# Patient Record
Sex: Male | Born: 1980
Health system: Southern US, Community
[De-identification: ages and names within clinical notes are randomized; demographics above are authoritative.]

## PROBLEM LIST (undated history)

## (undated) DIAGNOSIS — E7800 Pure hypercholesterolemia, unspecified: Secondary | ICD-10-CM

## (undated) DIAGNOSIS — I10 Essential (primary) hypertension: Secondary | ICD-10-CM

## (undated) DIAGNOSIS — F329 Major depressive disorder, single episode, unspecified: Secondary | ICD-10-CM

## (undated) DIAGNOSIS — E119 Type 2 diabetes mellitus without complications: Secondary | ICD-10-CM

## (undated) DIAGNOSIS — F32A Depression, unspecified: Secondary | ICD-10-CM

## (undated) HISTORY — DX: Essential (primary) hypertension: I10

---
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
## 1998-01-07 ENCOUNTER — Encounter: Admission: RE | Admit: 1998-01-07 | Discharge: 1998-01-07 | Payer: Self-pay | Admitting: Family Medicine

## 1998-01-28 ENCOUNTER — Encounter: Admission: RE | Admit: 1998-01-28 | Discharge: 1998-01-28 | Payer: Self-pay | Admitting: Family Medicine

## 1998-06-05 ENCOUNTER — Encounter: Admission: RE | Admit: 1998-06-05 | Discharge: 1998-06-05 | Payer: Self-pay | Admitting: Family Medicine

## 1998-06-12 ENCOUNTER — Encounter: Admission: RE | Admit: 1998-06-12 | Discharge: 1998-06-12 | Payer: Self-pay | Admitting: Family Medicine

## 1998-07-05 ENCOUNTER — Encounter: Admission: RE | Admit: 1998-07-05 | Discharge: 1998-07-05 | Payer: Self-pay | Admitting: Family Medicine

## 1998-11-01 ENCOUNTER — Encounter: Admission: RE | Admit: 1998-11-01 | Discharge: 1998-11-01 | Payer: Self-pay | Admitting: Family Medicine

## 1999-03-24 ENCOUNTER — Encounter: Payer: Self-pay | Admitting: *Deleted

## 1999-03-24 ENCOUNTER — Emergency Department (HOSPITAL_COMMUNITY): Admission: EM | Admit: 1999-03-24 | Discharge: 1999-03-24 | Payer: Self-pay | Admitting: Emergency Medicine

## 1999-05-09 ENCOUNTER — Emergency Department (HOSPITAL_COMMUNITY): Admission: EM | Admit: 1999-05-09 | Discharge: 1999-05-09 | Payer: Self-pay | Admitting: Emergency Medicine

## 1999-05-09 ENCOUNTER — Encounter: Payer: Self-pay | Admitting: Emergency Medicine

## 1999-10-15 ENCOUNTER — Encounter: Admission: RE | Admit: 1999-10-15 | Discharge: 1999-10-15 | Payer: Self-pay | Admitting: Family Medicine

## 1999-10-31 ENCOUNTER — Encounter: Admission: RE | Admit: 1999-10-31 | Discharge: 1999-10-31 | Payer: Self-pay | Admitting: Family Medicine

## 2000-01-28 ENCOUNTER — Emergency Department (HOSPITAL_COMMUNITY): Admission: EM | Admit: 2000-01-28 | Discharge: 2000-01-28 | Payer: Self-pay | Admitting: Emergency Medicine

## 2000-01-28 ENCOUNTER — Encounter: Payer: Self-pay | Admitting: Emergency Medicine

## 2000-05-24 ENCOUNTER — Encounter: Payer: Self-pay | Admitting: Emergency Medicine

## 2000-05-24 ENCOUNTER — Emergency Department (HOSPITAL_COMMUNITY): Admission: EM | Admit: 2000-05-24 | Discharge: 2000-05-24 | Payer: Self-pay | Admitting: Emergency Medicine

## 2001-04-23 ENCOUNTER — Emergency Department (HOSPITAL_COMMUNITY): Admission: EM | Admit: 2001-04-23 | Discharge: 2001-04-23 | Payer: Self-pay | Admitting: Emergency Medicine

## 2001-12-24 ENCOUNTER — Emergency Department (HOSPITAL_COMMUNITY): Admission: EM | Admit: 2001-12-24 | Discharge: 2001-12-24 | Payer: Self-pay | Admitting: Emergency Medicine

## 2002-09-14 ENCOUNTER — Encounter: Admission: RE | Admit: 2002-09-14 | Discharge: 2002-09-14 | Payer: Self-pay | Admitting: Sports Medicine

## 2003-08-16 ENCOUNTER — Emergency Department (HOSPITAL_COMMUNITY): Admission: EM | Admit: 2003-08-16 | Discharge: 2003-08-16 | Payer: Self-pay | Admitting: Emergency Medicine

## 2004-07-15 ENCOUNTER — Emergency Department (HOSPITAL_COMMUNITY): Admission: EM | Admit: 2004-07-15 | Discharge: 2004-07-15 | Payer: Self-pay | Admitting: Emergency Medicine

## 2005-06-25 ENCOUNTER — Emergency Department (HOSPITAL_COMMUNITY): Admission: EM | Admit: 2005-06-25 | Discharge: 2005-06-25 | Payer: Self-pay | Admitting: Family Medicine

## 2008-01-13 ENCOUNTER — Emergency Department (HOSPITAL_COMMUNITY): Admission: EM | Admit: 2008-01-13 | Discharge: 2008-01-13 | Payer: Self-pay | Admitting: Emergency Medicine

## 2008-02-20 ENCOUNTER — Emergency Department (HOSPITAL_COMMUNITY): Admission: EM | Admit: 2008-02-20 | Discharge: 2008-02-20 | Payer: Self-pay | Admitting: Emergency Medicine

## 2010-09-08 ENCOUNTER — Emergency Department (HOSPITAL_COMMUNITY)
Admission: EM | Admit: 2010-09-08 | Discharge: 2010-09-08 | Payer: Self-pay | Source: Home / Self Care | Admitting: Emergency Medicine

## 2011-03-04 ENCOUNTER — Emergency Department (HOSPITAL_COMMUNITY)
Admission: EM | Admit: 2011-03-04 | Discharge: 2011-03-04 | Disposition: A | Discharge status: Home / Self Care | Payer: Self-pay | Attending: Emergency Medicine | Admitting: Emergency Medicine

## 2011-03-04 DIAGNOSIS — K089 Disorder of teeth and supporting structures, unspecified: Secondary | ICD-10-CM | POA: Insufficient documentation

## 2011-03-04 DIAGNOSIS — K0381 Cracked tooth: Secondary | ICD-10-CM | POA: Insufficient documentation

## 2011-03-04 DIAGNOSIS — K029 Dental caries, unspecified: Secondary | ICD-10-CM | POA: Insufficient documentation

## 2012-02-08 ENCOUNTER — Emergency Department (INDEPENDENT_AMBULATORY_CARE_PROVIDER_SITE_OTHER)
Admission: EM | Admit: 2012-02-08 | Discharge: 2012-02-08 | Disposition: A | Discharge status: Home / Self Care | Payer: Self-pay | Source: Home / Self Care

## 2012-02-08 ENCOUNTER — Encounter (HOSPITAL_COMMUNITY): Payer: Self-pay

## 2012-02-08 DIAGNOSIS — H5789 Other specified disorders of eye and adnexa: Secondary | ICD-10-CM

## 2012-02-08 MED ORDER — TRAMADOL HCL 50 MG PO TABS: 50.0000 mg | ORAL_TABLET | Freq: Four times a day (QID) | ORAL | Status: AC | PRN

## 2012-02-08 MED ORDER — DOXYCYCLINE HYCLATE 100 MG PO TABS: 100.0000 mg | ORAL_TABLET | Freq: Two times a day (BID) | ORAL | Status: AC

## 2012-02-08 NOTE — Discharge Instructions (Signed)
Lesin Ocular (Eye Injury) El ojo puede lesionarse debido a rascaduras, cuerpos extraos, lentes de contacto, luz muy brillante (luz de [place]), e irritacin qumica. La crnea (la parte transparente del ojo) es muy sensible; incluso las heridas mnimas pueden ser dolorosas. La mayor parte de las lesiones en la crnea curan en 2 a 4 das. El tratamiento puede incluir:  Puede ser necesario el uso de antibiticos en gotas oculares o ungento para suavizar el ojo y prevenir infecciones. Las gotas que adormecen el ojo (gotas anestsicas) no deben utilizarse repetidamente. Estas retrasan la cura. Gotas para dilatar la pupila durante 1 a 2 das; a menudo sirven para Engineer, materials.   Parchar el ojo puede reducir la irritacin de parpadear y de la exposicin a la luz brillante. Si tiene un parche en el ojo, no debera conducir ni operar maquinaria porque disminuye su visin perifrica y su capacidad para discernir distancias.   Descansar el ojo. Mantenerse en una habitacin oscura y Chemical engineer lentes de sol para reducir la irritacin que provoca la luz.   No frotar el ojo durante las prximas 2 semanas para permitir Engineer, mining.  Si [place] lentes de contacto, no se los coloque hasta que tenga la autorizacin del profesional que lo asiste. Tambin podr necesitar medicamentos para Chief Technology Officer durante los prximos 1 a 2 das.  SOLICITE ATENCIN MDICA SI:  El dolor aumenta, observa una irritacin persistente o experimenta visin borrosa durante los prximos 2 das.  Document Released: 08/03/2005 Document Revised: 07/23/2011 [facility] Patient Information 2012 [place], [place].

## 2012-02-08 NOTE — ED Notes (Signed)
Pain in eye w swelling, redness
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
## 2013-08-17 HISTORY — PX: INCISION AND DRAINAGE INTRA ORAL ABSCESS: SHX1802

## 2013-09-10 ENCOUNTER — Emergency Department (HOSPITAL_COMMUNITY): Payer: Self-pay

## 2013-09-10 ENCOUNTER — Encounter (HOSPITAL_COMMUNITY): Payer: Self-pay | Admitting: Emergency Medicine

## 2013-09-10 ENCOUNTER — Inpatient Hospital Stay (HOSPITAL_COMMUNITY)
Admission: EM | Admit: 2013-09-10 | Discharge: 2013-09-15 | DRG: 638 | Disposition: A | Discharge status: Home / Self Care | Payer: Self-pay | Attending: Internal Medicine | Admitting: Internal Medicine

## 2013-09-10 DIAGNOSIS — E860 Dehydration: Secondary | ICD-10-CM | POA: Diagnosis present

## 2013-09-10 DIAGNOSIS — E1110 Type 2 diabetes mellitus with ketoacidosis without coma: Secondary | ICD-10-CM

## 2013-09-10 DIAGNOSIS — K0889 Other specified disorders of teeth and supporting structures: Secondary | ICD-10-CM | POA: Diagnosis present

## 2013-09-10 DIAGNOSIS — Z833 Family history of diabetes mellitus: Secondary | ICD-10-CM

## 2013-09-10 DIAGNOSIS — Z8639 Personal history of other endocrine, nutritional and metabolic disease: Secondary | ICD-10-CM | POA: Diagnosis present

## 2013-09-10 DIAGNOSIS — F411 Generalized anxiety disorder: Secondary | ICD-10-CM | POA: Diagnosis present

## 2013-09-10 DIAGNOSIS — K0530 Chronic periodontitis, unspecified: Secondary | ICD-10-CM | POA: Diagnosis present

## 2013-09-10 DIAGNOSIS — E119 Type 2 diabetes mellitus without complications: Secondary | ICD-10-CM | POA: Diagnosis present

## 2013-09-10 DIAGNOSIS — I10 Essential (primary) hypertension: Secondary | ICD-10-CM | POA: Diagnosis present

## 2013-09-10 DIAGNOSIS — F3130 Bipolar disorder, current episode depressed, mild or moderate severity, unspecified: Secondary | ICD-10-CM | POA: Diagnosis present

## 2013-09-10 DIAGNOSIS — F17200 Nicotine dependence, unspecified, uncomplicated: Secondary | ICD-10-CM | POA: Diagnosis present

## 2013-09-10 DIAGNOSIS — E1010 Type 1 diabetes mellitus with ketoacidosis without coma: Principal | ICD-10-CM | POA: Diagnosis present

## 2013-09-10 DIAGNOSIS — Z23 Encounter for immunization: Secondary | ICD-10-CM

## 2013-09-10 DIAGNOSIS — Z880 Allergy status to penicillin: Secondary | ICD-10-CM

## 2013-09-10 LAB — URINALYSIS, ROUTINE W REFLEX MICROSCOPIC
BILIRUBIN URINE: NEGATIVE
Ketones, ur: >80 mg/dL — AB
LEUKOCYTES UA: NEGATIVE
NITRITE: NEGATIVE
PH: 5 (ref 5.0–8.0)
Protein, ur: NEGATIVE mg/dL
Specific Gravity, Urine: 1.036 — ABNORMAL HIGH (ref 1.005–1.030)
Urobilinogen, UA: 0.2 mg/dL (ref 0.0–1.0)

## 2013-09-10 LAB — CBC
HCT: 46.6 % (ref 39.0–52.0)
HEMATOCRIT: 45.4 % (ref 39.0–52.0)
HEMOGLOBIN: 17.5 g/dL — AB (ref 13.0–17.0)
Hemoglobin: 17.2 g/dL — ABNORMAL HIGH (ref 13.0–17.0)
MCH: 30.9 pg (ref 26.0–34.0)
MCH: 30.5 pg (ref 26.0–34.0)
MCHC: 37.9 g/dL — AB (ref 30.0–36.0)
MCHC: 37.6 g/dL — AB (ref 30.0–36.0)
MCV: 81.7 fL (ref 78.0–100.0)
MCV: 81.3 fL (ref 78.0–100.0)
Platelets: 250 10*3/uL (ref 150–400)
Platelets: 234 10*3/uL (ref 150–400)
RBC: 5.56 MIL/uL (ref 4.22–5.81)
RBC: 5.73 MIL/uL (ref 4.22–5.81)
RDW: 12.5 % (ref 11.5–15.5)
RDW: 12.7 % (ref 11.5–15.5)
WBC: 10.7 10*3/uL — ABNORMAL HIGH (ref 4.0–10.5)
WBC: 10.8 10*3/uL — ABNORMAL HIGH (ref 4.0–10.5)

## 2013-09-10 LAB — GLUCOSE, CAPILLARY
Glucose-Capillary: 569 mg/dL (ref 70–99)
Glucose-Capillary: 473 mg/dL — ABNORMAL HIGH (ref 70–99)

## 2013-09-10 LAB — POCT I-STAT TROPONIN I: Troponin i, poc: 0 ng/mL (ref 0.00–0.08)

## 2013-09-10 LAB — BASIC METABOLIC PANEL
BUN: 20 mg/dL (ref 6–23)
BUN: 18 mg/dL (ref 6–23)
CHLORIDE: 89 meq/L — AB (ref 96–112)
CHLORIDE: 97 meq/L (ref 96–112)
CO2: 9 mEq/L — CL (ref 19–32)
CO2: 11 mEq/L — ABNORMAL LOW (ref 19–32)
CREATININE: 1.01 mg/dL (ref 0.50–1.35)
Calcium: 9.6 mg/dL (ref 8.4–10.5)
Calcium: 8.6 mg/dL (ref 8.4–10.5)
Creatinine, Ser: 1.03 mg/dL (ref 0.50–1.35)
GFR calc non Af Amer: >90 mL/min (ref >90)
Glucose, Bld: 558 mg/dL (ref 70–99)
Glucose, Bld: 409 mg/dL — ABNORMAL HIGH (ref 70–99)
POTASSIUM: 5.4 meq/L — AB (ref 3.7–5.3)
Potassium: 5.1 mEq/L (ref 3.7–5.3)
SODIUM: 130 meq/L — AB (ref 137–147)
SODIUM: 134 meq/L — AB (ref 137–147)

## 2013-09-10 LAB — POCT I-STAT 3, VENOUS BLOOD GAS (G3P V)
Acid-base deficit: 15 mmol/L — ABNORMAL HIGH (ref 0.0–2.0)
Bicarbonate: 10.4 mEq/L — ABNORMAL LOW (ref 20.0–24.0)
O2 Saturation: 88 %
PH VEN: 7.223 — AB (ref 7.250–7.300)
TCO2: 11 mmol/L (ref 0–100)
pCO2, Ven: 25.3 mmHg — ABNORMAL LOW (ref 45.0–50.0)
pO2, Ven: 64 mmHg — ABNORMAL HIGH (ref 30.0–45.0)

## 2013-09-10 LAB — COMPREHENSIVE METABOLIC PANEL
ALT: 15 U/L (ref 0–53)
AST: 12 U/L (ref 0–37)
Albumin: 4.4 g/dL (ref 3.5–5.2)
Alkaline Phosphatase: 141 U/L — ABNORMAL HIGH (ref 39–117)
BUN: 19 mg/dL (ref 6–23)
CALCIUM: 9.5 mg/dL (ref 8.4–10.5)
CO2: 10 mEq/L — CL (ref 19–32)
Chloride: 88 mEq/L — ABNORMAL LOW (ref 96–112)
Creatinine, Ser: 1.02 mg/dL (ref 0.50–1.35)
Glucose, Bld: 564 mg/dL (ref 70–99)
Potassium: 4.9 mEq/L (ref 3.7–5.3)
SODIUM: 129 meq/L — AB (ref 137–147)
TOTAL PROTEIN: 8.8 g/dL — AB (ref 6.0–8.3)
Total Bilirubin: 0.4 mg/dL (ref 0.3–1.2)

## 2013-09-10 LAB — KETONES, QUALITATIVE: Acetone, Bld: NEGATIVE

## 2013-09-10 LAB — URINE MICROSCOPIC-ADD ON

## 2013-09-10 LAB — LACTIC ACID, PLASMA: Lactic Acid, Venous: 1.7 mmol/L (ref 0.5–2.2)

## 2013-09-10 LAB — RAPID STREP SCREEN (MED CTR MEBANE ONLY): Streptococcus, Group A Screen (Direct): NEGATIVE

## 2013-09-10 MED ORDER — SODIUM CHLORIDE 0.9 % IV BOLUS (SEPSIS)
1000.0000 mL | Freq: Once | INTRAVENOUS | Status: AC
  Administered 2013-09-10: 1000 mL via INTRAVENOUS

## 2013-09-10 MED ORDER — SODIUM CHLORIDE 0.9 % IV SOLN
INTRAVENOUS | Status: AC
  Administered 2013-09-11: via INTRAVENOUS

## 2013-09-10 MED ORDER — INSULIN GLARGINE 100 UNIT/ML ~~LOC~~ SOLN
40.0000 [IU] | Freq: Every day | SUBCUTANEOUS | Status: DC
  Filled 2013-09-10: qty 0.4

## 2013-09-10 MED ORDER — DEXTROSE 50 % IV SOLN: 25.0000 mL | INTRAVENOUS | Status: DC | PRN

## 2013-09-10 MED ORDER — LURASIDONE HCL 40 MG PO TABS
20.0000 mg | ORAL_TABLET | Freq: Every day | ORAL | Status: DC
  Administered 2013-09-11 – 2013-09-15 (×5): 20 mg via ORAL
  Filled 2013-09-10 (×8): qty 1

## 2013-09-10 MED ORDER — ONDANSETRON HCL 4 MG/2ML IJ SOLN: 4.0000 mg | Freq: Four times a day (QID) | INTRAMUSCULAR | Status: DC | PRN

## 2013-09-10 MED ORDER — INSULIN REGULAR HUMAN 100 UNIT/ML IJ SOLN
INTRAMUSCULAR | Status: DC
  Administered 2013-09-10: 5.1 [IU]/h via INTRAVENOUS
  Administered 2013-09-10: 4.1 [IU]/h via INTRAVENOUS
  Filled 2013-09-10: qty 1

## 2013-09-10 MED ORDER — INSULIN REGULAR HUMAN 100 UNIT/ML IJ SOLN
INTRAMUSCULAR | Status: AC
  Administered 2013-09-11: 5 [IU]/h via INTRAVENOUS
  Administered 2013-09-11: 6.1 [IU]/h via INTRAVENOUS
  Administered 2013-09-11: 4.9 [IU]/h via INTRAVENOUS
  Administered 2013-09-11: 5.8 [IU]/h via INTRAVENOUS
  Administered 2013-09-11: 4.8 [IU]/h via INTRAVENOUS
  Administered 2013-09-11: 4.9 [IU]/h via INTRAVENOUS
  Administered 2013-09-12: 06:00:00 via INTRAVENOUS
  Administered 2013-09-12: 5.4 [IU]/h via INTRAVENOUS
  Administered 2013-09-12: 6.4 [IU]/h via INTRAVENOUS
  Administered 2013-09-12: 3.8 [IU]/h via INTRAVENOUS
  Administered 2013-09-12: 23:00:00 via INTRAVENOUS
  Administered 2013-09-12: 5.2 [IU]/h via INTRAVENOUS
  Administered 2013-09-12: 7.7 [IU]/h via INTRAVENOUS
  Administered 2013-09-12: 5.1 [IU]/h via INTRAVENOUS
  Administered 2013-09-12 (×2): 5.9 [IU]/h via INTRAVENOUS
  Administered 2013-09-12: 8.4 [IU]/h via INTRAVENOUS
  Filled 2013-09-10 (×3): qty 1

## 2013-09-10 MED ORDER — DEXTROSE-NACL 5-0.45 % IV SOLN
INTRAVENOUS | Status: DC
  Administered 2013-09-11 – 2013-09-12 (×3): via INTRAVENOUS

## 2013-09-10 MED ORDER — POTASSIUM CHLORIDE 10 MEQ/100ML IV SOLN
10.0000 meq | INTRAVENOUS | Status: DC
  Administered 2013-09-10 (×2): 10 meq via INTRAVENOUS
  Filled 2013-09-10: qty 100

## 2013-09-10 MED ORDER — SODIUM CHLORIDE 0.9 % IV SOLN
INTRAVENOUS | Status: DC
  Administered 2013-09-10: via INTRAVENOUS

## 2013-09-10 MED ORDER — FLUOXETINE HCL 20 MG PO CAPS
20.0000 mg | ORAL_CAPSULE | Freq: Every day | ORAL | Status: DC
  Administered 2013-09-11 – 2013-09-15 (×5): 20 mg via ORAL
  Filled 2013-09-10 (×5): qty 1

## 2013-09-10 MED ORDER — ENOXAPARIN SODIUM 40 MG/0.4ML ~~LOC~~ SOLN
40.0000 mg | Freq: Every day | SUBCUTANEOUS | Status: DC
  Administered 2013-09-11 – 2013-09-14 (×4): 40 mg via SUBCUTANEOUS
  Filled 2013-09-10 (×5): qty 0.4

## 2013-09-10 MED ORDER — SODIUM CHLORIDE 0.9 % IV SOLN: INTRAVENOUS | Status: DC

## 2013-09-10 MED ORDER — DEXTROSE-NACL 5-0.45 % IV SOLN: INTRAVENOUS | Status: DC

## 2013-09-10 NOTE — ED Notes (Signed)
Pt here with fever headache, dizziness and not feeling well,

## 2013-09-10 NOTE — ED Notes (Signed)
Pt is being seen over at [facility], Pt says he feels like he has the flu, Pt has swelling below left eye, c/o headache, and weight loss

## 2013-09-10 NOTE — H&P (Signed)
[facility] History and Physical  Brandon Mcneil OZH:[id number] DOB: 07/08/1981 DOA: 09/10/2013  Referring physician: ED PCP: Pcp Not In System   Chief Complaint:  Polyuria and Polydipsia x 2 weeks  HPI:  33 year old male with the history of bipolar disorder, active tobacco use who presented to the ED with 2 weeks of polyuria and polydipsia and generalized fatigue as well as some throat congestion. Patient reports being thirsty all the time and drinking a lot of water. He has been getting up 4-5 times a night to urinate. He has been treating excessively tired. He reports that he has lost almost over 20 pounds since he checked his weight at [facility] 3 weeks back. He also has developed aversion to smoking ( reports cutting down to 1/2 PPD). Patient reports having some frontal headache and swelling over left upper jaw with pain over left upper molar, dry mouth, cough with phlegm, throat congestion, nausea but no vomiting, dyspnea on exertion .He denies any fever, chills, denies blurred vision, has nausea but no vomiting, has dyspnea on exertion, denied chest pain, palpitations, abdominal pain or bowel symptoms. Denies any weakness. He denies use of any new medications. He reports not having a drink in the last 3 weeks. (Drinks over the weekends)  Course in the ED Patient noted to be tachycardic to 110s. Blood pressure and a straight normal. Patient was afebrile. O'Donnel showed WBC of 10.7. Chemistry showed and Acidosis with blood glucose greater than 550s and positive urine ketones Suggestive of DKA. Patient had an anion gap of 31. Patient had a potassium of 5.4. His venous blood gas showed pH of 7.2, PCO2 of 25.3 and PO2 of 64 bicarbonate of 10.4. Lactic acid was negative. Serum ketones was negative as well. Patient started on aggressive IV hydration and insulin drip. 5 hospitalists called for admission post abdominal.  Review of Systems:  Constitutional: Denies fever, chills, diaphoresis,  appetite change and fatigue.  HEENT: congestion, sore throat, dry mouth, Denies photophobia, eye pain, redness, hearing loss, ear pain, rhinorrhea, sneezing, mouth sores, trouble swallowing, neck pain, neck stiffness and tinnitus.   Respiratory: Dyspnea on exertion, cough with mucoid phlegm, denies chest tightness,  and wheezing.   Cardiovascular: Denies chest pain, palpitations and leg swelling.  Gastrointestinal: Nausea, Denies vomiting, abdominal pain, diarrhea, constipation, blood in stool and abdominal distention.  Genitourinary: polyuria and polydipsia Denies dysuria, urgency,  hematuria, flank pain and difficulty urinating.  Endocrine: Denies hot or cold intolerance, sweats,  polyuria, polydipsia. Musculoskeletal: Denies myalgias, back pain, joint swelling, arthralgias and gait problem.   Neurological: Denies dizziness, seizures, syncope, weakness, light-headedness, numbness and headaches.  Psychiatric/Behavioral: Denies suicidal ideation, mood changes, confusion, nervousness, sleep disturbance and agitation   Past Medical history Bipolar disorder, tobacco abuse  History reviewed. No pertinent past surgical history.  Social History:  reports that he has been smoking.  He does not have any smokeless tobacco history on file. He reports that he drinks alcohol. He reports that he does not use illicit drugs.  Allergies  Allergen Reactions  . Penicillins Itching and Rash    History reviewed. No pertinent family history.  Prior to Admission medications   Medication Sig Start Date End Date Taking? Authorizing Provider  acetaminophen (TYLENOL) 500 MG tablet Take 1,000 mg by mouth every 6 (six) hours as needed for moderate pain.   Yes Historical Provider, MD  dextromethorphan-guaiFENesin (MUCINEX DM) 30-600 MG per 12 hr tablet Take 1 tablet by mouth daily as needed for cough.   Yes  Historical Provider, MD  ibuprofen (ADVIL,MOTRIN) 200 MG tablet Take 600 mg by mouth every 6 (six) hours as  needed for moderate pain.   Yes Historical Provider, MD    Physical Exam:  Filed Vitals:   09/10/13 2024 09/10/13 2030 09/10/13 2045 09/10/13 2100  BP:  150/92 141/84 145/80  Pulse:  111 110 108  Temp:      TempSrc:      Resp:  16 21 23   Height:      Weight:      SpO2: 96% 94% 97% 98%    Constitutional: Vital signs reviewed.  Patient is a middle aged male lying in bed in no acute distress HEENT: No pallor, no icterus, dry oral mucosa: No cervical lymphadenopathy, mild swelling over left upper jaw with left upper molar caries. Chest: Clear to auscultation bilaterally, no added sounds CVS: Normal S1-S2, no murmurs or gallop Abdomen: Soft, nontender, nondistended, bowel sounds present Extremities: Warm, no edema CNS: AAO x3  Labs on Admission:  Basic Metabolic Panel:  Recent Labs Lab 09/10/13 1757 09/10/13 2118  NA 129* 130*  K 4.9 5.4*  CL 88* 89*  CO2 10* 9*  GLUCOSE 564* 558*  BUN 19 20  CREATININE 1.02 1.03  CALCIUM 9.5 9.6   Liver Function Tests:  Recent Labs Lab 09/10/13 1757  AST 12  ALT 15  ALKPHOS 141*  BILITOT 0.4  PROT 8.8*  ALBUMIN 4.4   No results found for this basename: LIPASE, AMYLASE,  in the last 168 hours No results found for this basename: AMMONIA,  in the last 168 hours CBC:  Recent Labs Lab 09/10/13 1757 09/10/13 2118  WBC 10.7* 10.8*  HGB 17.2* 17.5*  HCT 45.4 46.6  MCV 81.7 81.3  PLT 250 234   Cardiac Enzymes: No results found for this basename: CKTOTAL, CKMB, CKMBINDEX, TROPONINI,  in the last 168 hours BNP: No components found with this basename: POCBNP,  CBG:  Recent Labs Lab 09/10/13 2107 09/10/13 2214  GLUCAP 569* 473*    Radiological Exams on Admission: Dg Chest 2 View  09/10/2013   CLINICAL DATA:  Sternal chest pain and dizziness  EXAM: CHEST  2 VIEW  COMPARISON:  DG CHEST 2 VIEW dated 02/20/2008  FINDINGS: The heart size and mediastinal contours are within normal limits. Both lungs are clear. The visualized  skeletal structures are unremarkable.  IMPRESSION: No active cardiopulmonary disease.   Electronically Signed   By: Christiana Pellant M.D.   On: 09/10/2013 18:46      Assessment/Plan  Active Problems:   DKA (diabetic ketoacidoses) Admit to stepdown. Receive IV hydration with normal saline. Started on IV insulin in the ED. Would continue on glucose nebulizer protocol. Continue to monitor BMET Q2 hr evaluate anion gap. Patient has significantly low bicarbonate. Continue insulin drip until AG closed( <12) and serum bicarb >=15. PH of 7.2. She does not need bicarbonate drip. -transition to lantus 40 units sq ( 0.4 u/ kg) once AG closed and serum bicarb >=15.   Check A1C .  Diabetic coordinator consult.  patient not having insurance. We need to set up [facility]. Case management consult for medication assistance.   Bipolar disorder with anxiety and depression Follows at [facility]. On latuda 20 mg daily and prozac 20 mg daily which i will continue . Denies any suicidal ideation.   Tobacco abuse Counseled on smoking cessation. Will prescribe nicotine patch.  Periodontitis Will place him on empiric doxycycline. Needs outpt  dental follow up upon discharge.  Diet: Diabetic DVT prophylaxis: Subcutaneous Lovenox  Code Status:  Full code Family Communication: none at bedside Disposition Plan: Home once improved  Eddie North [facility] Pager [phone]  If 7PM-7AM, please contact night-coverage [email or web address] Password [facility] 09/10/2013, 11:24 PM   Total time spent: 70 minutes

## 2013-09-10 NOTE — ED Notes (Signed)
Throat is red and is sore

## 2013-09-10 NOTE — ED Provider Notes (Signed)
CSN: [id number]     Arrival date & time 09/10/13  1735 History   First MD Initiated Contact with Patient 09/10/13 2011     Chief Complaint  Patient presents with  . Influenza    HPI: Brandon Mcneil is a 33 yo M with history of HTN who presents with polyuria, polydipsia, fatigue, headache and lightheadedness. Symptoms started three weeks ago with polyuria and polydipsia. He has had progressive fatigue. Over the last week he has noted lightheadedness worse with standing but today was more constant. He endorses mild cough productive of clear sputum. No fever or chills. He has continued to eat. He has not had abdominal pain, vomiting or diarrhea. No dysuria. He has not had symptoms similar to this in the past. Today he felt more lightheaded which prompted his visit to the ED.    History reviewed. No pertinent past medical history. History reviewed. No pertinent past surgical history. History reviewed. No pertinent family history. History  Substance Use Topics  . Smoking status: Current Every Day Smoker  . Smokeless tobacco: Not on file  . Alcohol Use: Yes    Review of Systems  Constitutional: Positive for activity change (decreased), appetite change (decreased ) and fatigue. Negative for fever and chills.  Eyes: Negative for photophobia and visual disturbance.  Respiratory: Positive for cough. Negative for shortness of breath.   Cardiovascular: Negative for chest pain and leg swelling.  Gastrointestinal: Negative for nausea, vomiting, abdominal pain, diarrhea and constipation.  Endocrine: Positive for polydipsia and polyuria.  Genitourinary: Negative for dysuria, frequency and decreased urine volume.  Musculoskeletal: Negative for arthralgias, back pain, gait problem and myalgias.  Skin: Negative for color change and wound.  Neurological: Positive for light-headedness and headaches. Negative for dizziness and syncope.  Psychiatric/Behavioral: Negative for confusion and agitation.  All other  systems reviewed and are negative.     Allergies  Penicillins  Home Medications   Current Outpatient Rx  Name  Route  Sig  Dispense  Refill  . acetaminophen (TYLENOL) 500 MG tablet   Oral   Take 1,000 mg by mouth every 6 (six) hours as needed for moderate pain.         Marland Kitchen dextromethorphan-guaiFENesin (MUCINEX DM) 30-600 MG per 12 hr tablet   Oral   Take 1 tablet by mouth daily as needed for cough.         Marland Kitchen ibuprofen (ADVIL,MOTRIN) 200 MG tablet   Oral   Take 600 mg by mouth every 6 (six) hours as needed for moderate pain.          BP 144/85  Pulse 111  Temp(Src) 97.9 F (36.6 C) (Oral)  Resp 25  Ht 5\' 8"  (1.727 m)  Wt 219 lb 3 oz (99.423 kg)  BMI 33.34 kg/m2  SpO2 96% Physical Exam  Nursing note and vitals reviewed. Constitutional: He is oriented to person, place, and time. He appears well-developed and well-nourished. No distress.  Middle age male, sitting up in bed, appears comfortable, pleasant and interactive.    HENT:  Head: Normocephalic and atraumatic.  Mouth/Throat: Mucous membranes are dry.  Eyes: Conjunctivae and EOM are normal. Pupils are equal, round, and reactive to light.  Neck: Normal range of motion. Neck supple.  Cardiovascular: Regular rhythm, normal heart sounds and intact distal pulses.  Tachycardia present.   Pulmonary/Chest: Effort normal and breath sounds normal. No respiratory distress.  Abdominal: Soft. Bowel sounds are normal. There is no tenderness. There is no rebound and no guarding.  Musculoskeletal:  Normal range of motion. He exhibits no edema and no tenderness.  Neurological: He is alert and oriented to person, place, and time. No cranial nerve deficit. Coordination normal.  Skin: Skin is warm and dry. No rash noted.  Psychiatric: He has a normal mood and affect. His behavior is normal.    ED Course  Procedures (including critical care time) Labs Review Labs Reviewed  CBC - Abnormal; Notable for the following:    WBC 10.7  (*)    Hemoglobin 17.2 (*)    MCHC 37.9 (*)    All other components within normal limits  COMPREHENSIVE METABOLIC PANEL - Abnormal; Notable for the following:    Sodium 129 (*)    Chloride 88 (*)    CO2 10 (*)    Glucose, Bld 564 (*)    Total Protein 8.8 (*)    Alkaline Phosphatase 141 (*)    All other components within normal limits  URINALYSIS, ROUTINE W REFLEX MICROSCOPIC - Abnormal; Notable for the following:    Specific Gravity, Urine 1.036 (*)    Glucose, UA >1000 (*)    Hgb urine dipstick TRACE (*)    Ketones, ur >80 (*)    All other components within normal limits  BASIC METABOLIC PANEL - Abnormal; Notable for the following:    Sodium 130 (*)    Potassium 5.4 (*)    Chloride 89 (*)    CO2 9 (*)    Glucose, Bld 558 (*)    All other components within normal limits  CBC - Abnormal; Notable for the following:    WBC 10.8 (*)    Hemoglobin 17.5 (*)    MCHC 37.6 (*)    All other components within normal limits  URINE MICROSCOPIC-ADD ON - Abnormal; Notable for the following:    Casts HYALINE CASTS (*)    All other components within normal limits  POCT I-STAT 3, BLOOD GAS (G3P V) - Abnormal; Notable for the following:    pH, Ven 7.223 (*)    pCO2, Ven 25.3 (*)    pO2, Ven 64.0 (*)    Bicarbonate 10.4 (*)    Acid-base deficit 15.0 (*)    All other components within normal limits  RAPID STREP SCREEN  CULTURE, GROUP A STREP  KETONES, QUALITATIVE  LACTIC ACID, PLASMA  BLOOD GAS, VENOUS  BASIC METABOLIC PANEL  BASIC METABOLIC PANEL  BASIC METABOLIC PANEL  POCT I-STAT TROPONIN I   Imaging Review Dg Chest 2 View  09/10/2013   CLINICAL DATA:  Sternal chest pain and dizziness  EXAM: CHEST  2 VIEW  COMPARISON:  DG CHEST 2 VIEW dated 02/20/2008  FINDINGS: The heart size and mediastinal contours are within normal limits. Both lungs are clear. The visualized skeletal structures are unremarkable.  IMPRESSION: No active cardiopulmonary disease.   Electronically Signed   By:  Christiana Pellant M.D.   On: 09/10/2013 18:46    EKG Interpretation   None       MDM  33 yo M with history of HTN presents with new onset DM. Low suspicion for infection as CXR negative for consolidation, UA without leukocytes or nitrites, afebrile. Labs c/w DKA given glucose of 564, Co2 10, pH 7.22, AG 31. Lactic acid 1.7. Given two liter of NS and started on insulin gtt. Second BMP shows worsening CO2 to 9 and K of 5.4. Stopped K run. Given he is in DKA he was admitted to Hospitalist for further management. He remained HD stable. Patient in agreement  with plan.   Reviewed imaging, labs and previous medical records, utilized in MDM  Discussed case with Dr. Micheline Mazeocherty  Clinical Impression 1. Diabetic ketoacidosis 2. AG metabolic acidosis     Brandon BilletMathias Cashis Rill, MD 09/11/13 [phone]

## 2013-09-11 DIAGNOSIS — E860 Dehydration: Secondary | ICD-10-CM | POA: Diagnosis present

## 2013-09-11 DIAGNOSIS — F3130 Bipolar disorder, current episode depressed, mild or moderate severity, unspecified: Secondary | ICD-10-CM | POA: Diagnosis present

## 2013-09-11 DIAGNOSIS — F17200 Nicotine dependence, unspecified, uncomplicated: Secondary | ICD-10-CM

## 2013-09-11 DIAGNOSIS — E119 Type 2 diabetes mellitus without complications: Secondary | ICD-10-CM | POA: Diagnosis present

## 2013-09-11 DIAGNOSIS — K0889 Other specified disorders of teeth and supporting structures: Secondary | ICD-10-CM | POA: Diagnosis present

## 2013-09-11 HISTORY — DX: Nicotine dependence, unspecified, uncomplicated: F17.200

## 2013-09-11 HISTORY — DX: Bipolar disorder, current episode depressed, mild or moderate severity, unspecified: F31.30

## 2013-09-11 LAB — BASIC METABOLIC PANEL
BUN: 16 mg/dL (ref 6–23)
BUN: 11 mg/dL (ref 6–23)
BUN: 9 mg/dL (ref 6–23)
BUN: 9 mg/dL (ref 6–23)
BUN: 9 mg/dL (ref 6–23)
BUN: 13 mg/dL (ref 6–23)
CALCIUM: 8.6 mg/dL (ref 8.4–10.5)
CALCIUM: 8.3 mg/dL — AB (ref 8.4–10.5)
CHLORIDE: 102 meq/L (ref 96–112)
CHLORIDE: 98 meq/L (ref 96–112)
CO2: 12 mEq/L — ABNORMAL LOW (ref 19–32)
CO2: 16 mEq/L — ABNORMAL LOW (ref 19–32)
CO2: 16 meq/L — AB (ref 19–32)
CO2: 18 meq/L — AB (ref 19–32)
CO2: 17 mEq/L — ABNORMAL LOW (ref 19–32)
CO2: 13 meq/L — AB (ref 19–32)
CREATININE: 0.78 mg/dL (ref 0.50–1.35)
CREATININE: 0.82 mg/dL (ref 0.50–1.35)
Calcium: 8.7 mg/dL (ref 8.4–10.5)
Calcium: 8.4 mg/dL (ref 8.4–10.5)
Calcium: 8.4 mg/dL (ref 8.4–10.5)
Calcium: 8.5 mg/dL (ref 8.4–10.5)
Chloride: 99 mEq/L (ref 96–112)
Chloride: 101 mEq/L (ref 96–112)
Chloride: 100 mEq/L (ref 96–112)
Chloride: 101 mEq/L (ref 96–112)
Creatinine, Ser: 0.93 mg/dL (ref 0.50–1.35)
Creatinine, Ser: 0.84 mg/dL (ref 0.50–1.35)
Creatinine, Ser: 0.72 mg/dL (ref 0.50–1.35)
Creatinine, Ser: 0.67 mg/dL (ref 0.50–1.35)
GFR calc Af Amer: >90 mL/min (ref >90)
GFR calc Af Amer: >90 mL/min (ref >90)
GFR calc Af Amer: >90 mL/min (ref >90)
GFR calc Af Amer: >90 mL/min (ref >90)
GFR calc non Af Amer: >90 mL/min (ref >90)
GFR calc non Af Amer: >90 mL/min (ref >90)
GFR calc non Af Amer: >90 mL/min (ref >90)
GLUCOSE: 264 mg/dL — AB (ref 70–99)
GLUCOSE: 196 mg/dL — AB (ref 70–99)
Glucose, Bld: 279 mg/dL — ABNORMAL HIGH (ref 70–99)
Glucose, Bld: 184 mg/dL — ABNORMAL HIGH (ref 70–99)
Glucose, Bld: 136 mg/dL — ABNORMAL HIGH (ref 70–99)
Glucose, Bld: 128 mg/dL — ABNORMAL HIGH (ref 70–99)
POTASSIUM: 4.5 meq/L (ref 3.7–5.3)
POTASSIUM: 3.5 meq/L — AB (ref 3.7–5.3)
POTASSIUM: 3.2 meq/L — AB (ref 3.7–5.3)
Potassium: 4.6 mEq/L (ref 3.7–5.3)
Potassium: 3.8 mEq/L (ref 3.7–5.3)
Potassium: 3.8 mEq/L (ref 3.7–5.3)
SODIUM: 132 meq/L — AB (ref 137–147)
SODIUM: 135 meq/L — AB (ref 137–147)
SODIUM: 136 meq/L — AB (ref 137–147)
Sodium: 135 mEq/L — ABNORMAL LOW (ref 137–147)
Sodium: 135 mEq/L — ABNORMAL LOW (ref 137–147)
Sodium: 133 mEq/L — ABNORMAL LOW (ref 137–147)

## 2013-09-11 LAB — GLUCOSE, CAPILLARY
GLUCOSE-CAPILLARY: 146 mg/dL — AB (ref 70–99)
GLUCOSE-CAPILLARY: 164 mg/dL — AB (ref 70–99)
GLUCOSE-CAPILLARY: 199 mg/dL — AB (ref 70–99)
GLUCOSE-CAPILLARY: 222 mg/dL — AB (ref 70–99)
GLUCOSE-CAPILLARY: 304 mg/dL — AB (ref 70–99)
GLUCOSE-CAPILLARY: 350 mg/dL — AB (ref 70–99)
Glucose-Capillary: 113 mg/dL — ABNORMAL HIGH (ref 70–99)
Glucose-Capillary: 120 mg/dL — ABNORMAL HIGH (ref 70–99)
Glucose-Capillary: 136 mg/dL — ABNORMAL HIGH (ref 70–99)
Glucose-Capillary: 140 mg/dL — ABNORMAL HIGH (ref 70–99)
Glucose-Capillary: 141 mg/dL — ABNORMAL HIGH (ref 70–99)
Glucose-Capillary: 142 mg/dL — ABNORMAL HIGH (ref 70–99)
Glucose-Capillary: 144 mg/dL — ABNORMAL HIGH (ref 70–99)
Glucose-Capillary: 145 mg/dL — ABNORMAL HIGH (ref 70–99)
Glucose-Capillary: 150 mg/dL — ABNORMAL HIGH (ref 70–99)
Glucose-Capillary: 153 mg/dL — ABNORMAL HIGH (ref 70–99)
Glucose-Capillary: 158 mg/dL — ABNORMAL HIGH (ref 70–99)
Glucose-Capillary: 162 mg/dL — ABNORMAL HIGH (ref 70–99)
Glucose-Capillary: 197 mg/dL — ABNORMAL HIGH (ref 70–99)
Glucose-Capillary: 199 mg/dL — ABNORMAL HIGH (ref 70–99)
Glucose-Capillary: 222 mg/dL — ABNORMAL HIGH (ref 70–99)
Glucose-Capillary: 230 mg/dL — ABNORMAL HIGH (ref 70–99)
Glucose-Capillary: 239 mg/dL — ABNORMAL HIGH (ref 70–99)
Glucose-Capillary: 373 mg/dL — ABNORMAL HIGH (ref 70–99)

## 2013-09-11 LAB — CBC
HEMATOCRIT: 41.8 % (ref 39.0–52.0)
Hemoglobin: 15.8 g/dL (ref 13.0–17.0)
MCH: 30.7 pg (ref 26.0–34.0)
MCHC: 37.8 g/dL — ABNORMAL HIGH (ref 30.0–36.0)
MCV: 81.3 fL (ref 78.0–100.0)
Platelets: 224 10*3/uL (ref 150–400)
RBC: 5.14 MIL/uL (ref 4.22–5.81)
RDW: 12.7 % (ref 11.5–15.5)
WBC: 11.6 10*3/uL — AB (ref 4.0–10.5)

## 2013-09-11 LAB — MRSA PCR SCREENING: MRSA BY PCR: NEGATIVE

## 2013-09-11 LAB — HEMOGLOBIN A1C
Hgb A1c MFr Bld: 12.1 % — ABNORMAL HIGH (ref <5.7)
Mean Plasma Glucose: 301 mg/dL — ABNORMAL HIGH (ref <117)

## 2013-09-11 LAB — PHOSPHORUS: Phosphorus: 2.7 mg/dL (ref 2.3–4.6)

## 2013-09-11 MED ORDER — LIVING WELL WITH DIABETES BOOK
Freq: Once | Status: DC
  Filled 2013-09-11: qty 1

## 2013-09-11 MED ORDER — DM-GUAIFENESIN ER 30-600 MG PO TB12
1.0000 | ORAL_TABLET | Freq: Every day | ORAL | Status: DC | PRN
  Filled 2013-09-11: qty 1

## 2013-09-11 MED ORDER — BD GETTING STARTED TAKE HOME KIT: 3/10ML X 30G SYRINGES
1.0000 | Freq: Once | Status: AC
  Administered 2013-09-12: 1
  Filled 2013-09-11: qty 1

## 2013-09-11 MED ORDER — LISINOPRIL 2.5 MG PO TABS
2.5000 mg | ORAL_TABLET | Freq: Every day | ORAL | Status: DC
  Administered 2013-09-12 – 2013-09-14 (×3): 2.5 mg via ORAL
  Filled 2013-09-11 (×4): qty 1

## 2013-09-11 MED ORDER — NICOTINE 14 MG/24HR TD PT24
14.0000 mg | MEDICATED_PATCH | Freq: Every day | TRANSDERMAL | Status: DC
  Administered 2013-09-11 – 2013-09-15 (×6): 14 mg via TRANSDERMAL
  Filled 2013-09-11 (×6): qty 1

## 2013-09-11 MED ORDER — DOXYCYCLINE HYCLATE 100 MG PO TABS
100.0000 mg | ORAL_TABLET | Freq: Two times a day (BID) | ORAL | Status: DC
  Administered 2013-09-11 – 2013-09-15 (×10): 100 mg via ORAL
  Filled 2013-09-11 (×12): qty 1

## 2013-09-11 NOTE — Progress Notes (Signed)
Pt anion gap was 16 at 2100 will continue to monitor

## 2013-09-11 NOTE — Plan of Care (Signed)
Chart reviewed-note last BMET @ 3am with AG of 22. Had diet ordered and Lantus planned for 10 am. Will allow clears and check stat BMET and make sure AG has closed before transition to solid diet and Lantus. Also note water deficit of 5 L at admit.  Junious SilkAllison Lelah Rennaker, ANP

## 2013-09-11 NOTE — Progress Notes (Signed)
[facility]  Brandon Mcneil:[id number] DOB: 1981-04-23 DOA: 09/10/2013 PCP: Pcp Not In System  Brief narrative: 33 year old male patient with a history significant for bipolar disorder. Presented to the ER with complaints of polyuria polydipsia for 2 weeks. Noted at least a 20 pound weight loss for the last 3 weeks.   In the emergency department the pt was found to be suffering w/ DKA.  He had no prior hx of DM.  Assessment/Plan:    DKA / Newly diagnosed diabetes -continue insulin gtt until bicarb 18 or > AND anion gap 12 or < -Once able to transition to subcutaneous insulin would prefer to use Relion brand 70/30 since patient does not have insurance -Diabetes educator consulted/ordered for outpatient diabetes clinic follow up -Case management consult for outpatient referral to [facility] clinic -Hemoglobin A1c 12.1 so suspect we'll need to continue insulin for the short term at discharge -Nutrition consultation regarding diet education for new diabetes    Dehydration -At admission water deficit calculated at 5 L -Continue IV fluids    Bipolar I disorder, most recent episode depressed -Continue home meds    Tobacco use disorder    Pain, dental -Started on Vibramycin this admit -If pain persists we'll need to follow up with dentist after discharge-given financial status may need to be referred to a dental clinic as well   DVT prophylaxis: Lovenox Code Status: Full Family Communication: No family at bedside Disposition Plan/Expected LOS: Step down  Physician Consultants: None  Procedures: None  Antibiotics: Vibramycin 1/25 >>>  HPI/Subjective: Patient alert. Shocked at his formal diagnosis of diabetes but states multiple family members with same. No mention of dental pain. No shortness of breath or chest pain  Objective: Blood pressure 142/84, pulse 95, temperature 98.6 F (37 C), temperature source Oral,  resp. rate 17, height 5\' 8"  (1.727 m), weight 219 lb 3 Brandon (99.423 kg), SpO2 98.00%.  Intake/Output Summary (Last 24 hours) at 09/11/13 1347 Last data filed at 09/11/13 1300  Gross per 24 hour  Intake 2826.67 ml  Output   2430 ml  Net 396.67 ml   Exam: General: No acute respiratory distress Lungs: Clear to auscultation bilaterally without wheezes or crackles, RA Cardiovascular: Regular rate and rhythm without murmur gallop or rub normal S1 and S2, no peripheral edema or JVD Abdomen: Nontender, nondistended, soft, bowel sounds positive, no rebound, no ascites, no appreciable mass Musculoskeletal: No significant cyanosis, clubbing of bilateral lower extremities Neurological: Alert and oriented x 3, moves all extremities x 4 without focal neurological deficits, CN 2-12 intact  Scheduled Meds:  Scheduled Meds: . doxycycline  100 mg Oral Q12H  . enoxaparin (LOVENOX) injection  40 mg Subcutaneous Daily  . FLUoxetine  20 mg Oral Daily  . lurasidone  20 mg Oral Q breakfast  . nicotine  14 mg Transdermal Daily   Continuous Infusions: . dextrose 5 % and 0.45% NaCl 75 mL/hr at 09/11/13 0700  . insulin (NOVOLIN-R) infusion 4.9 Units/hr (09/11/13 1338)    Data Reviewed: Basic Metabolic Panel:  Recent Labs Lab 09/10/13 2118 09/10/13 2245 09/11/13 0045 09/11/13 0302 09/11/13 0850  NA 130* 134* 136* 135* 135*  K 5.4* 5.1 4.6 4.5 3.8  CL 89* 97 99 101 100  CO2 9* 11* 13* 12* 16*  GLUCOSE 558* 409* 279* 184* 136*  BUN 20 18 16 13 11   CREATININE 1.03 1.01 0.93 0.84 0.78  CALCIUM 9.6 8.6 8.7 8.4 8.6  PHOS  --   --  2.7  --   --    Liver Function Tests:  Recent Labs Lab 09/10/13 1757  AST 12  ALT 15  ALKPHOS 141*  BILITOT 0.4  PROT 8.8*  ALBUMIN 4.4   No results found for this basename: LIPASE, AMYLASE,  in the last 168 hours No results found for this basename: AMMONIA,  in the last 168 hours  CBC:  Recent Labs Lab 09/10/13 0045 09/10/13 1757 09/10/13 2118  WBC 11.6*  10.7* 10.8*  HGB 15.8 17.2* 17.5*  HCT 41.8 45.4 46.6  MCV 81.3 81.7 81.3  PLT 224 250 234   CBG:  Recent Labs Lab 09/11/13 0907 09/11/13 1013 09/11/13 1121 09/11/13 1232 09/11/13 1335  GLUCAP 239* 145* 140* 144* 141*    Recent Results (from the past 240 hour(s))  RAPID STREP SCREEN     Status: None   Collection Time    09/10/13  5:57 PM      Result Value Range Status   Streptococcus, Group A Screen (Direct) NEGATIVE  NEGATIVE Final   Comment: (NOTE)     A Rapid Antigen test may result negative if the antigen level in the     sample is below the detection level of this test. The FDA has not     cleared this test as a stand-alone test therefore the rapid antigen     negative result has reflexed to a Group A Strep culture.  MRSA PCR SCREENING     Status: None   Collection Time    09/11/13  1:04 AM      Result Value Range Status   MRSA by PCR NEGATIVE  NEGATIVE Final   Comment:            The GeneXpert MRSA Assay (FDA     approved for NASAL specimens     only), is one component of a     comprehensive MRSA colonization     surveillance program. It is not     intended to diagnose MRSA     infection nor to guide or     monitor treatment for     MRSA infections.     Studies:  Recent x-ray studies have been reviewed in detail by the Attending Physician  Time spent : 35mins     Junious Silkllison Ellis, ANP [facility] Office  [phone] Pager [phone]  **If unable to reach the above provider after paging please contact the Flow Manager @ [phone]  On-Call/Text Page:      [email or web address]      password [email or web address]  If 7PM-7AM, please contact night-coverage [email or web address] Password [email or web address] 09/11/2013, 1:47 PM   LOS: 1 day   I have personally examined this patient and reviewed the entire database. I have reviewed the above note, made any necessary editorial changes, and agree with its content.  Lonia BloodJeffrey T. Gyneth Hubka, MD [facility]

## 2013-09-11 NOTE — Progress Notes (Signed)
Inpatient Diabetes Program Recommendations  AACE/ADA: New Consensus Statement on Inpatient Glycemic Control (2013)  Target Ranges:  Prepandial:   less than 140 mg/dL      Peak postprandial:   less than 180 mg/dL (1-2 hours)      Critically ill patients:  140 - 180 mg/dL   Reason for Visit: New onset diabetes. Results for TERRIN, MEDDAUGH (MRN [id number]) as of 09/11/2013 15:49  Ref. Range 09/10/2013 21:07  Glucose-Capillary Latest Range: 70-99 mg/dL [facility])  Results for THARUN, CAPPELLA (MRN [id number]) as of 09/11/2013 15:49  Ref. Range 09/11/2013 03:02  Hemoglobin A1C Latest Range: <5.7 % 12.1 (H)   Diabetes history: No-New onset Outpatient Diabetes medications: None Current orders for Inpatient glycemic control: Insulin drip  Talked to patient briefly about new onset diabetes.  He does have family history.  Discussed likelihood of needing insulin at discharge.  He has already started watching the videos and seems engaged.  Currently he does not have insurance coverage but states he is in the process of getting disability.  Agree with 70/30 regimen do to cost and note plans for follow-up at [facility] clinic.  Discussed [facility] Reli-on brand meter and insulins.  Will order basic diabetes teaching including videos, booklet, and insulin starter kit.  Will follow-up on 09/12/13.  Adah Perl, RN, BC-ADM Inpatient Diabetes Coordinator Pager [phone]

## 2013-09-11 NOTE — Progress Notes (Signed)
Pt anion gap was 15 @1900  will continue to monitor

## 2013-09-11 NOTE — Progress Notes (Signed)
Utilization review completed.  

## 2013-09-11 NOTE — Plan of Care (Signed)
Problem: Food- and Nutrition-Related Knowledge Deficit (NB-1.1) Goal: Nutrition education Formal process to instruct or train a patient/client in a skill or to impart knowledge to help patients/clients voluntarily manage or modify food choices and eating behavior to maintain or improve health. Outcome: Completed/Met Date Met:  09/11/13  RD consulted for nutrition education regarding diabetes.     Lab Results  Component Value Date    HGBA1C 12.1* 09/11/2013    RD provided "Carbohydrate Counting for People with Diabetes" handout from the Academy of Nutrition and Dietetics. Discussed different food groups and their effects on blood sugar, emphasizing carbohydrate-containing foods. Provided list of carbohydrates and recommended serving sizes of common foods.  Discussed importance of controlled and consistent carbohydrate intake throughout the day. Provided examples of ways to balance meals/snacks and encouraged intake of high-fiber, whole grain complex carbohydrates. Teach back method used.  Expect good compliance.  Body mass index is 33.34 kg/(m^2). Pt meets criteria for Obesity Class I based on current BMI.  Current diet order is Clear Liquids.  Patient hungry, ready to eat solids.  Labs and medications reviewed.   No further nutrition interventions warranted at this time. If additional nutrition issues arise, please re-consult RD.  Arthur Holms, RD, LDN  Pager #: [phone] After-Hours Pager #: [phone]

## 2013-09-12 DIAGNOSIS — E860 Dehydration: Secondary | ICD-10-CM

## 2013-09-12 LAB — BASIC METABOLIC PANEL
BUN: 8 mg/dL (ref 6–23)
BUN: 8 mg/dL (ref 6–23)
BUN: 7 mg/dL (ref 6–23)
BUN: 7 mg/dL (ref 6–23)
BUN: 6 mg/dL (ref 6–23)
BUN: 6 mg/dL (ref 6–23)
CALCIUM: 8.3 mg/dL — AB (ref 8.4–10.5)
CHLORIDE: 104 meq/L (ref 96–112)
CO2: 18 mEq/L — ABNORMAL LOW (ref 19–32)
CO2: 20 mEq/L (ref 19–32)
CO2: 16 meq/L — AB (ref 19–32)
CO2: 18 meq/L — AB (ref 19–32)
CO2: 17 meq/L — AB (ref 19–32)
CO2: 19 mEq/L (ref 19–32)
CREATININE: 0.68 mg/dL (ref 0.50–1.35)
CREATININE: 0.77 mg/dL (ref 0.50–1.35)
CREATININE: 0.69 mg/dL (ref 0.50–1.35)
Calcium: 8.5 mg/dL (ref 8.4–10.5)
Calcium: 8.4 mg/dL (ref 8.4–10.5)
Calcium: 8.3 mg/dL — ABNORMAL LOW (ref 8.4–10.5)
Calcium: 8.4 mg/dL (ref 8.4–10.5)
Calcium: 8.5 mg/dL (ref 8.4–10.5)
Chloride: 101 mEq/L (ref 96–112)
Chloride: 100 mEq/L (ref 96–112)
Chloride: 104 mEq/L (ref 96–112)
Chloride: 105 mEq/L (ref 96–112)
Chloride: 101 mEq/L (ref 96–112)
Creatinine, Ser: 0.67 mg/dL (ref 0.50–1.35)
Creatinine, Ser: 0.68 mg/dL (ref 0.50–1.35)
Creatinine, Ser: 0.64 mg/dL (ref 0.50–1.35)
GFR calc Af Amer: >90 mL/min (ref >90)
GFR calc Af Amer: >90 mL/min (ref >90)
GFR calc Af Amer: >90 mL/min (ref >90)
GFR calc Af Amer: >90 mL/min (ref >90)
GFR calc Af Amer: >90 mL/min (ref >90)
GFR calc non Af Amer: >90 mL/min (ref >90)
GFR calc non Af Amer: >90 mL/min (ref >90)
GFR calc non Af Amer: >90 mL/min (ref >90)
GFR calc non Af Amer: >90 mL/min (ref >90)
GLUCOSE: 182 mg/dL — AB (ref 70–99)
GLUCOSE: 109 mg/dL — AB (ref 70–99)
Glucose, Bld: 106 mg/dL — ABNORMAL HIGH (ref 70–99)
Glucose, Bld: 130 mg/dL — ABNORMAL HIGH (ref 70–99)
Glucose, Bld: 210 mg/dL — ABNORMAL HIGH (ref 70–99)
Glucose, Bld: 210 mg/dL — ABNORMAL HIGH (ref 70–99)
POTASSIUM: 3.8 meq/L (ref 3.7–5.3)
POTASSIUM: 3.4 meq/L — AB (ref 3.7–5.3)
Potassium: 3.3 mEq/L — ABNORMAL LOW (ref 3.7–5.3)
Potassium: 3 mEq/L — ABNORMAL LOW (ref 3.7–5.3)
Potassium: 3.4 mEq/L — ABNORMAL LOW (ref 3.7–5.3)
Potassium: 3.2 mEq/L — ABNORMAL LOW (ref 3.7–5.3)
SODIUM: 135 meq/L — AB (ref 137–147)
SODIUM: 136 meq/L — AB (ref 137–147)
Sodium: 134 mEq/L — ABNORMAL LOW (ref 137–147)
Sodium: 135 mEq/L — ABNORMAL LOW (ref 137–147)
Sodium: 136 mEq/L — ABNORMAL LOW (ref 137–147)
Sodium: 133 mEq/L — ABNORMAL LOW (ref 137–147)

## 2013-09-12 LAB — GLUCOSE, CAPILLARY
GLUCOSE-CAPILLARY: 129 mg/dL — AB (ref 70–99)
GLUCOSE-CAPILLARY: 182 mg/dL — AB (ref 70–99)
GLUCOSE-CAPILLARY: 187 mg/dL — AB (ref 70–99)
GLUCOSE-CAPILLARY: 200 mg/dL — AB (ref 70–99)
Glucose-Capillary: 102 mg/dL — ABNORMAL HIGH (ref 70–99)
Glucose-Capillary: 108 mg/dL — ABNORMAL HIGH (ref 70–99)
Glucose-Capillary: 134 mg/dL — ABNORMAL HIGH (ref 70–99)
Glucose-Capillary: 136 mg/dL — ABNORMAL HIGH (ref 70–99)
Glucose-Capillary: 136 mg/dL — ABNORMAL HIGH (ref 70–99)
Glucose-Capillary: 154 mg/dL — ABNORMAL HIGH (ref 70–99)
Glucose-Capillary: 158 mg/dL — ABNORMAL HIGH (ref 70–99)
Glucose-Capillary: 161 mg/dL — ABNORMAL HIGH (ref 70–99)
Glucose-Capillary: 162 mg/dL — ABNORMAL HIGH (ref 70–99)
Glucose-Capillary: 174 mg/dL — ABNORMAL HIGH (ref 70–99)
Glucose-Capillary: 178 mg/dL — ABNORMAL HIGH (ref 70–99)
Glucose-Capillary: 188 mg/dL — ABNORMAL HIGH (ref 70–99)
Glucose-Capillary: 191 mg/dL — ABNORMAL HIGH (ref 70–99)

## 2013-09-12 LAB — CULTURE, GROUP A STREP

## 2013-09-12 MED ORDER — POTASSIUM CHLORIDE 20 MEQ/15ML (10%) PO LIQD
40.0000 meq | Freq: Once | ORAL | Status: AC
  Administered 2013-09-12: 40 meq via ORAL
  Filled 2013-09-12: qty 30

## 2013-09-12 MED ORDER — POTASSIUM CHLORIDE CRYS ER 20 MEQ PO TBCR: 40.0000 meq | EXTENDED_RELEASE_TABLET | Freq: Once | ORAL | Status: DC

## 2013-09-12 MED ORDER — POTASSIUM CHLORIDE 20 MEQ/15ML (10%) PO LIQD
20.0000 meq | Freq: Once | ORAL | Status: AC
  Administered 2013-09-12: 20 meq via ORAL
  Filled 2013-09-12: qty 15

## 2013-09-12 NOTE — Plan of Care (Signed)
Problem: Consults Goal: Diabetes Mellitus Patient Education See Patient Education Module for education specifics. Outcome: Progressing Pt watching Diabetes videos through pt education, asked this writer question all questions were answered . Had a lengthy discussion with patient about need to control and monitor blood sugar. Diabetes educator to see patient in the am. Goal: Diagnosis-Diabetes Mellitus Outcome: Progressing New Onset Type II   Problem: Phase I Progression Outcomes Goal: CBGs steadily decreasing with treatment Outcome: Progressing Blood sugars are decreasing but anion gap is still @ 15 as of 0500 hrs Goal: NPO or per MD order Outcome: Progressing Pt on diet Goal: Pain controlled with appropriate interventions Outcome: Progressing Pt denies pain Goal: Voiding-avoid urinary catheter unless indicated Outcome: Progressing Pt voiding Goal: Hemodynamically stable Outcome: Progressing vss

## 2013-09-12 NOTE — Progress Notes (Addendum)
Pt has watched educational videos. Pt has received and been educated on BD starter packet. Pt gave self lovenox injection for practice of similar insulin injections. Performed well. Pt performing self finger stick glucose monitoring, properly. Will continue to educate patient on questions pertaining to DM. Will show pt how to give insulin injections once I/G gtt DC. Pt given living well with DM book.

## 2013-09-12 NOTE — Progress Notes (Signed)
   CARE MANAGEMENT NOTE 09/12/2013  Patient:  Brandon Mcneil,Brandon Mcneil   Account Number:  [id number]  Date Initiated:  09/12/2013  Documentation initiated by:  Donn PieriniWEBSTER,Maddock Finigan  Subjective/Objective Assessment:   Pt admitted with DKA     Action/Plan:   PTA pt lived at home - independent   Anticipated DC Date:  09/15/2013   Anticipated DC Plan:  HOME/SELF CARE      DC Planning Services  CM consult  [facility]igent [facility] Clinic      Choice offered to / List presented to:             Status of service:  In process, will continue to follow Medicare Important Message given?   (If response is "NO", the following Medicare IM given date fields will be blank) Date Medicare IM given:   Date Additional Medicare IM given:    Discharge Disposition:    Per UR Regulation:  Reviewed for med. necessity/level of care/duration of stay  If discussed at Long Length of Stay Meetings, dates discussed:    Comments:  09/12/13- 1430- Donn PieriniKristi Carmelo Reidel RN, BSN [phone] Referral for PCP needs- pt does not have insurance- in to speak with pt regarding clinic options for - pt states that he has applied for disability and is in the appeals process- hoping to hear something by March. [facility] with [facility] to f/u with pt regarding disability questions and Medicaid potential- message left for [facility]. Also spoke with pt about which clinic would be most convenient for him to get to for f/u- [facility] him and he feels would be the best location for him to get to for f/u. [facility] and f/u appointment made for Feb. 10 at 1045- pt will need to bring picture ID and medication list. - Explained to pt that clinic has sliding scale fee and he would need to do eligibility to see what his fee would be- message was left with Willaim BanePamela Henry - the eligibility person at clinic- call returned and she spoke with pt by phone for eligibility and gave pt instructions on what he needed to  bring to his appointment - fee will be $35- which pt states is doable. - will cont. to follow for medication needs- [facility] DM needs-

## 2013-09-12 NOTE — Progress Notes (Signed)
Inpatient Diabetes Program Recommendations  AACE/ADA: New Consensus Statement on Inpatient Glycemic Control (2013)  Target Ranges:  Prepandial:   less than 140 mg/dL      Peak postprandial:   less than 180 mg/dL (1-2 hours)      Critically ill patients:  140 - 180 mg/dL   Reason for Visit: New onset diabetes.    Patient continues on insulin drip.  He has watched all diabetes videos and is anxious to eat.  Discussed briefly CHO content of foods and portions including the plate method.  He gave himself his Lovenox injection today.  Once subcutaneous insulin is started, he can begin practicing insulin administration. [facility].  Discussed glucose meter and insulin [facility] Will follow.  Thanks, Beryl MeagerJenny Renesha Lizama, RN, BC-ADM Inpatient Diabetes Coordinator Pager [phone]

## 2013-09-12 NOTE — Progress Notes (Signed)
Physician notified: A. Rennis HardingEllis NP At: 1536  Regarding: FYI GAP 14 with BMP from 1400.

## 2013-09-12 NOTE — ED Provider Notes (Signed)
Medical screening examination/treatment/procedure(s) were conducted as a shared visit with resident-physician practitioner(s) and myself.  I personally evaluated the patient during the encounter.  Pt is a 33 y.o. male with pmhx as above presenting with new onset DM and DKA.  Pt remains HDS, was started on glucostabilizer protocol.  [facility] to admit.   Shanna CiscoMegan E Docherty, MD 09/12/13 [phone]

## 2013-09-12 NOTE — Progress Notes (Signed)
[facility]  VIDIT BOISSONNEAULT IDP:[id number] DOB: [birth year] DOA: 09/10/2013 PCP: Pcp Not In System  Brief narrative: 33 year old male patient with a history significant for bipolar disorder. Presented to the ER with complaints of polyuria polydipsia for 2 weeks. Noted at least a 20 pound weight loss for the last 3 weeks.   In the emergency department the pt was found to be suffering w/ DKA.  He had no prior hx of DM.  Assessment/Plan:    DKA / Newly diagnosed diabetes -continue insulin gtt until bicarb 18 or > AND anion gap 12 or < -10 am AG 15- if closes later today plan is to transition to SQ insulin -Once able to transition to subcutaneous insulin would prefer to use Relion brand 70/30 since patient does not have insurance -Diabetes educator consulted/ordered for outpatient diabetes clinic follow up -Case management consult for outpatient referral to [facility] clinic -Hemoglobin A1c 12.1 so plan on insulin at dc -Nutrition consultation regarding diet education for new diabetes *AG at 3 pm 14    Dehydration -At admission water deficit calculated at 5 L -Continue IV fluids    Bipolar I disorder, most recent episode depressed -Continue home meds    Tobacco use disorder    Pain, dental -Started on Vibramycin this admit -If pain persists we'll need to follow up with dentist after discharge-given financial status may need to be referred to a dental clinic as well   DVT prophylaxis: Lovenox Code Status: Full Family Communication: No family at bedside Disposition Plan/Expected LOS: Step down until can come off glucomander  Physician Consultants: None  Procedures: None  Antibiotics: Vibramycin 1/25 >>>  HPI/Subjective: Patient alert -RN at bedside teaching pt how to self inject insulin using NS. Pt denies abd pain or other sx's  Objective: Blood pressure 129/86, pulse 85, temperature 97.8 F (36.6 C), temperature  source Oral, resp. rate 19, height _0  (1.727 m), weight 219 lb 3 oz (99.423 kg), SpO2 100.00%.  Intake/Output Summary (Last 24 hours) at 09/12/13 1058 Last data filed at 09/12/13 0900  Gross per 24 hour  Intake   1765 ml  Output   1200 ml  Net    565 ml   Exam: General: No acute respiratory distress Lungs: Clear to auscultation bilaterally without wheezes or crackles, RA Cardiovascular: Regular rate and rhythm without murmur gallop or rub normal S1 and S2, no peripheral edema or JVD Abdomen: Nontender, nondistended, soft, bowel sounds positive, no rebound, no ascites, no appreciable mass Musculoskeletal: No significant cyanosis, clubbing of bilateral lower extremities Neurological: Alert and oriented x 3, moves all extremities x 4 without focal neurological deficits, CN 2-12 intact  Scheduled Meds:  Scheduled Meds: . bd getting started take home kit  1 kit Other Once  . doxycycline  100 mg Oral Q12H  . enoxaparin (LOVENOX) injection  40 mg Subcutaneous Daily  . FLUoxetine  20 mg Oral Daily  . lisinopril  2.5 mg Oral Daily  . living well with diabetes book   Does not apply Once  . lurasidone  20 mg Oral Q breakfast  . nicotine  14 mg Transdermal Daily   Continuous Infusions: . dextrose 5 % and 0.45% NaCl 75 mL/hr at 09/12/13 0531  . insulin (NOVOLIN-R) infusion 5.9 Units/hr (09/12/13 1035)    Data Reviewed: Basic Metabolic Panel:  Recent Labs Lab 09/10/13 2245 09/11/13 0045  09/11/13 1916 09/11/13 4431 09/12/13 0112 09/12/13 0320 09/12/13 5400  NA 134* 136*  < > 132* 133* 134* 135* 135*  K 5.1 4.6  < > 3.8 3.2* 3.3* 3.0* 3.8  CL 97 99  < > 98 101 101 100 104  CO2 11* 13*  < > 18* 16* 18* 20 16*  GLUCOSE 409* 279*  < > 264* 196* 106* 130* 210*  BUN 18 16  < > _0 CREATININE 1.01 0.93  < > 0.82 0.67 0.68 0.77 0.67  CALCIUM 8.6 8.7  < > 8.3* 8.5 8.5 8.4 8.3*  PHOS  --  2.7  --   --   --   --   --   --   < > = values in this interval not displayed. Liver  Function Tests:  Recent Labs Lab 09/10/13 1757  AST 12  ALT 15  ALKPHOS 141*  BILITOT 0.4  PROT 8.8*  ALBUMIN 4.4   No results found for this basename: LIPASE, AMYLASE,  in the last 168 hours No results found for this basename: AMMONIA,  in the last 168 hours  CBC:  Recent Labs Lab 09/10/13 0045 09/10/13 1757 09/10/13 2118  WBC 11.6* 10.7* 10.8*  HGB 15.8 17.2* 17.5*  HCT 41.8 45.4 46.6  MCV 81.3 81.7 81.3  PLT 224 250 234   CBG:  Recent Labs Lab 09/12/13 0613 09/12/13 0715 09/12/13 0821 09/12/13 0928 09/12/13 1033  GLUCAP 182* 191* 187* 200* 158*    Recent Results (from the past 240 hour(s))  RAPID STREP SCREEN     Status: None   Collection Time    09/10/13  5:57 PM      Result Value Range Status   Streptococcus, Group A Screen (Direct) NEGATIVE  NEGATIVE Final   Comment: (NOTE)     A Rapid Antigen test may result negative if the antigen level in the     sample is below the detection level of this test. The FDA has not     cleared this test as a stand-alone test therefore the rapid antigen     negative result has reflexed to a Group A Strep culture.  CULTURE, GROUP A STREP     Status: None   Collection Time    09/10/13  5:57 PM      Result Value Range Status   Specimen Description THROAT   Final   Special Requests NONE   Final   Culture     Final   Value: NO SUSPICIOUS COLONIES, CONTINUING TO HOLD     Performed at [facility]   Report Status PENDING   Incomplete  MRSA PCR SCREENING     Status: None   Collection Time    09/11/13  1:04 AM      Result Value Range Status   MRSA by PCR NEGATIVE  NEGATIVE Final   Comment:            The GeneXpert MRSA Assay (FDA     approved for NASAL specimens     only), is one component of a     comprehensive MRSA colonization     surveillance program. It is not     intended to diagnose MRSA     infection nor to guide or     monitor treatment for     MRSA infections.     Studies:  Recent x-ray  studies have been reviewed in detail by the Attending Physician  Time spent : 20mns     AErin Hearing ANP [facility]  [phone] Pager [phone]  **If unable to reach the above provider after paging please contact the [place] @ [phone]  On-Call/Text Page:      [email or web address]      password [email or web address]  If 7PM-7AM, please contact night-coverage [email or web address] Password [facility] 09/12/2013, 10:58 AM   LOS: 2 days    I have examined the patient, reviewed the chart and modified the above note which I agree with.   Cullan Launer,MD [phone] 09/12/2013, 4:37 PM

## 2013-09-12 NOTE — Progress Notes (Signed)
Pt anion gap is 15 @ 0330, will continue to monitor

## 2013-09-12 NOTE — Progress Notes (Signed)
Anion gap 15 with 0940 BMP. Weston SettleA. Ellis notified.

## 2013-09-13 LAB — GLUCOSE, CAPILLARY
GLUCOSE-CAPILLARY: 114 mg/dL — AB (ref 70–99)
GLUCOSE-CAPILLARY: 114 mg/dL — AB (ref 70–99)
GLUCOSE-CAPILLARY: 137 mg/dL — AB (ref 70–99)
GLUCOSE-CAPILLARY: 164 mg/dL — AB (ref 70–99)
GLUCOSE-CAPILLARY: 165 mg/dL — AB (ref 70–99)
GLUCOSE-CAPILLARY: 178 mg/dL — AB (ref 70–99)
GLUCOSE-CAPILLARY: 216 mg/dL — AB (ref 70–99)
GLUCOSE-CAPILLARY: 369 mg/dL — AB (ref 70–99)
Glucose-Capillary: 142 mg/dL — ABNORMAL HIGH (ref 70–99)
Glucose-Capillary: 100 mg/dL — ABNORMAL HIGH (ref 70–99)
Glucose-Capillary: 155 mg/dL — ABNORMAL HIGH (ref 70–99)
Glucose-Capillary: 167 mg/dL — ABNORMAL HIGH (ref 70–99)
Glucose-Capillary: 181 mg/dL — ABNORMAL HIGH (ref 70–99)
Glucose-Capillary: 182 mg/dL — ABNORMAL HIGH (ref 70–99)
Glucose-Capillary: 141 mg/dL — ABNORMAL HIGH (ref 70–99)
Glucose-Capillary: 290 mg/dL — ABNORMAL HIGH (ref 70–99)
Glucose-Capillary: 371 mg/dL — ABNORMAL HIGH (ref 70–99)

## 2013-09-13 LAB — BASIC METABOLIC PANEL
BUN: 5 mg/dL — AB (ref 6–23)
BUN: 5 mg/dL — AB (ref 6–23)
CALCIUM: 8.4 mg/dL (ref 8.4–10.5)
CHLORIDE: 105 meq/L (ref 96–112)
CO2: 20 mEq/L (ref 19–32)
CO2: 20 meq/L (ref 19–32)
CREATININE: 0.71 mg/dL (ref 0.50–1.35)
Calcium: 8.2 mg/dL — ABNORMAL LOW (ref 8.4–10.5)
Chloride: 106 mEq/L (ref 96–112)
Creatinine, Ser: 0.65 mg/dL (ref 0.50–1.35)
GFR calc Af Amer: >90 mL/min (ref >90)
GFR calc non Af Amer: >90 mL/min (ref >90)
GFR calc non Af Amer: >90 mL/min (ref >90)
GLUCOSE: 174 mg/dL — AB (ref 70–99)
Glucose, Bld: 157 mg/dL — ABNORMAL HIGH (ref 70–99)
POTASSIUM: 3.8 meq/L (ref 3.7–5.3)
POTASSIUM: 3.3 meq/L — AB (ref 3.7–5.3)
SODIUM: 138 meq/L (ref 137–147)
Sodium: 139 mEq/L (ref 137–147)

## 2013-09-13 MED ORDER — INSULIN ASPART PROT & ASPART (70-30 MIX) 100 UNIT/ML ~~LOC~~ SUSP
40.0000 [IU] | Freq: Two times a day (BID) | SUBCUTANEOUS | Status: DC
  Filled 2013-09-13: qty 10

## 2013-09-13 MED ORDER — INSULIN ASPART PROT & ASPART (70-30 MIX) 100 UNIT/ML ~~LOC~~ SUSP
20.0000 [IU] | Freq: Two times a day (BID) | SUBCUTANEOUS | Status: DC
  Administered 2013-09-13 (×2): 20 [IU] via SUBCUTANEOUS
  Filled 2013-09-13 (×2): qty 10

## 2013-09-13 MED ORDER — INSULIN ASPART 100 UNIT/ML ~~LOC~~ SOLN
0.0000 [IU] | Freq: Every day | SUBCUTANEOUS | Status: DC
  Administered 2013-09-13: 5 [IU] via SUBCUTANEOUS
  Administered 2013-09-14: 3 [IU] via SUBCUTANEOUS

## 2013-09-13 MED ORDER — POTASSIUM CHLORIDE 20 MEQ/15ML (10%) PO LIQD
20.0000 meq | Freq: Once | ORAL | Status: AC
  Administered 2013-09-13: 20 meq via ORAL
  Filled 2013-09-13: qty 15

## 2013-09-13 MED ORDER — POTASSIUM CHLORIDE CRYS ER 20 MEQ PO TBCR
40.0000 meq | EXTENDED_RELEASE_TABLET | Freq: Once | ORAL | Status: AC
  Administered 2013-09-13: 40 meq via ORAL
  Filled 2013-09-13: qty 2

## 2013-09-13 MED ORDER — INSULIN ASPART 100 UNIT/ML ~~LOC~~ SOLN
0.0000 [IU] | Freq: Three times a day (TID) | SUBCUTANEOUS | Status: DC
  Administered 2013-09-13: 5 [IU] via SUBCUTANEOUS
  Administered 2013-09-13: 9 [IU] via SUBCUTANEOUS
  Administered 2013-09-14: 7 [IU] via SUBCUTANEOUS
  Administered 2013-09-14: 3 [IU] via SUBCUTANEOUS
  Administered 2013-09-14: 7 [IU] via SUBCUTANEOUS
  Administered 2013-09-15: 2 [IU] via SUBCUTANEOUS
  Administered 2013-09-15: 1 [IU] via SUBCUTANEOUS

## 2013-09-13 NOTE — Progress Notes (Signed)
Brandon Mcneil:[id number] DOB: Jan 21, 1981 DOA: 09/10/2013 PCP: Pcp Not In System  Brief narrative: 33 year old male patient with a history significant for bipolar disorder. Presented to the ER with complaints of polyuria polydipsia for 2 weeks. Noted at least a 20 pound weight loss for the last 3 weeks.   In the emergency department the pt was found to be suffering w/ DKA.  He had no prior hx of DM.  Assessment/Plan:    DKA / Newly diagnosed diabetes -Transitioned to 70/30 insulin 1/28 - prefer to use [employer] brand since patient does not have insurance -Diabetes educator consulted/ordered for outpatient diabetes clinic follow up -Case management has arranged for outpt f/u  -Hemoglobin A1c 12.1  -Nutrition consulted regarding diet education for new diabetes    Dehydration -resolved w/ volume resuscitation     Bipolar I disorder, most recent episode depressed -Continue home meds -Latuda can cause hyperglycemia but pt well controlled on this med so would adjust insulin as opposed to dc this med    Tobacco use disorder    Pain, dental -Started on Vibramycin this admit -If pain persists we'll need to follow up with dentist after discharge - given financial status may need to be referred to a dental clinic as well  DVT prophylaxis: Lovenox Code Status: Full Family Communication: No family at bedside Disposition Plan/Expected LOS: Transfer to floor but keep on Team 1-if CBG remains well controlled on 70/30 can dc in next 24-48 hrs  Physician Consultants: None  Procedures: None  Antibiotics: Vibramycin 1/25 >>>  HPI/Subjective: Patient alert and standing at side of bed.  Objective: Blood pressure 122/67, pulse 87, temperature 98.3 F (36.8 C), temperature source Oral, resp. rate 17, height 5\' 8"  (1.727 m), weight 219 lb 3 oz (99.423 kg), SpO2 98.00%.  Intake/Output Summary (Last 24 hours) at 09/13/13 1252 Last  data filed at 09/13/13 0815  Gross per 24 hour  Intake 2496.5 ml  Output   1200 ml  Net 1296.5 ml   Exam: General: No acute respiratory distress Lungs: Clear to auscultation bilaterally without wheezes or crackles, RA Cardiovascular: Regular rate and rhythm without murmur gallop or rub normal S1 and S2, no peripheral edema or JVD Abdomen: Nontender, nondistended, soft, bowel sounds positive, no rebound, no ascites, no appreciable mass Musculoskeletal: No significant cyanosis, clubbing of bilateral lower extremities Neurological: Alert and oriented x 3, moves all extremities x 4 without focal neurological deficits, CN 2-12 intact  Scheduled Meds:  Scheduled Meds: . doxycycline  100 mg Oral Q12H  . enoxaparin (LOVENOX) injection  40 mg Subcutaneous Daily  . FLUoxetine  20 mg Oral Daily  . insulin aspart  0-5 Units Subcutaneous QHS  . insulin aspart  0-9 Units Subcutaneous TID WC  . insulin aspart protamine- aspart  20 Units Subcutaneous BID WC  . lisinopril  2.5 mg Oral Daily  . living well with diabetes book   Does not apply Once  . lurasidone  20 mg Oral Q breakfast  . nicotine  14 mg Transdermal Daily   Data Reviewed: Basic Metabolic Panel:  Recent Labs Lab 09/10/13 2245 09/11/13 0045  09/12/13 1408 09/12/13 1837 09/12/13 2226 09/13/13 0310 09/13/13 0540  NA 134* 136*  < > 136* 136* 133* 138 139  K 5.1 4.6  < > 3.4* 3.4* 3.2* 3.8 3.3*  CL 97 99  < > 104 105 101 105 106  CO2 11* 13*  < >  18* 17* 19 20 20   GLUCOSE 409* 279*  < > 210* 182* 109* 157* 174*  BUN 18 16  < > 7 6 6  5* 5*  CREATININE 1.01 0.93  < > 0.68 0.64 0.69 0.71 0.65  CALCIUM 8.6 8.7  < > 8.3* 8.4 8.5 8.4 8.2*  PHOS  --  2.7  --   --   --   --   --   --   < > = values in this interval not displayed. Liver Function Tests:  Recent Labs Lab 09/10/13 1757  AST 12  ALT 15  ALKPHOS 141*  BILITOT 0.4  PROT 8.8*  ALBUMIN 4.4   CBC:  Recent Labs Lab 09/10/13 0045 09/10/13 1757 09/10/13 2118    WBC 11.6* 10.7* 10.8*  HGB 15.8 17.2* 17.5*  HCT 41.8 45.4 46.6  MCV 81.3 81.7 81.3  PLT 224 250 234   CBG:  Recent Labs Lab 09/13/13 0519 09/13/13 0622 09/13/13 0724 09/13/13 0823 09/13/13 1126  GLUCAP 182* 178* 165* 141* 290*    Recent Results (from the past 240 hour(s))  RAPID STREP SCREEN     Status: None   Collection Time    09/10/13  5:57 PM      Result Value Range Status   Streptococcus, Group A Screen (Direct) NEGATIVE  NEGATIVE Final   Comment: (NOTE)     A Rapid Antigen test may result negative if the antigen level in the     sample is below the detection level of this test. The FDA has not     cleared this test as a stand-alone test therefore the rapid antigen     negative result has reflexed to a Group A Strep culture.  CULTURE, GROUP A STREP     Status: None   Collection Time    09/10/13  5:57 PM      Result Value Range Status   Specimen Description THROAT   Final   Special Requests NONE   Final   Culture     Final   Value: STREPTOCOCCUS,BETA HEMOLYTIC NOT GROUP A     [facility] at [facility]   Report Status 09/12/2013 FINAL   Final  MRSA PCR SCREENING     Status: None   Collection Time    09/11/13  1:04 AM      Result Value Range Status   MRSA by PCR NEGATIVE  NEGATIVE Final   Comment:            The GeneXpert MRSA Assay (FDA     approved for NASAL specimens     only), is one component of a     comprehensive MRSA colonization     surveillance program. It is not     intended to diagnose MRSA     infection nor to guide or     monitor treatment for     MRSA infections.     Studies:  Recent x-ray studies have been reviewed in detail by the Attending Physician  Time spent : 25mins     Junious Silkllison Ellis, ANP [facility] Office  [phone] Pager [phone]  **If unable to reach the above provider after paging please contact the Flow Manager @ [phone]  On-Call/Text Page:      [email or web address]      password [email or web address]  If 7PM-7AM,  please contact night-coverage [email or web address] Password [email or web address] 09/13/2013, 12:52 PM   LOS: 3 days   I have personally examined this patient and reviewed the entire database.  I have reviewed the above note, made any necessary editorial changes, and agree with its content.  Cherene Altes, MD [facility]

## 2013-09-13 NOTE — Progress Notes (Signed)
Pt [facility] to 5W11 [facility] . Pt stable denies pain , SOB, or any other concern .No other needs at this time handoff given.

## 2013-09-13 NOTE — Plan of Care (Signed)
AG closed > 8 hours- current AG=13 so will transition to 70/30 20 units BID w/ meals and add sensitive SSI. (Received >90 units of regular insulin via drip/24 hrs)  Junious SilkAllison Yoshi Vicencio, ANP

## 2013-09-13 NOTE — Progress Notes (Signed)
Inpatient Diabetes Program Recommendations  AACE/ADA: New Consensus Statement on Inpatient Glycemic Control (2013)  Target Ranges:  Prepandial:   less than 140 mg/dL      Peak postprandial:   less than 180 mg/dL (1-2 hours)      Critically ill patients:  140 - 180 mg/dL   Reason for Visit: Patient transitioned off insulin drip this morning to 70/30 regimen.   Followed up with patient regarding diabetes survival skills.  Discussed A1C goals and Diabetes Standards of Care.  Reviewed proper identification and treatment of hypoglycemia.  Patient was able to teach back.  Also gave him handouts regarding fast foods nutritional facts, CHO counting, etc.  Patient states he feels good about taking care of himself.  Reiterated the importance of follow-up with PCP and continued management of diabetes to prevent complications.   Thanks, Beryl MeagerJenny Kahleah Crass, RN, BC-ADM Inpatient Diabetes Coordinator Pager [phone]

## 2013-09-13 NOTE — Progress Notes (Signed)
NURSING PROGRESS NOTE  Brandon Mcneil [id number] Transfer Data: 09/13/2013 5:14 PM Attending Provider: Lonia BloodJeffrey T McClung, MD PCP:Pcp Not In System Code Status: full  Brandon Mcneil is a 33 y.o. male patient transferred from 3S -No acute distress noted.  -No complaints of shortness of breath.  -No complaints of chest pain.    Blood pressure 142/78, pulse 105, temperature 97.5 F (36.4 C), temperature source Oral, resp. rate 22, height 5\' 8"  (1.727 m), weight 99.423 kg (219 lb 3 oz), SpO2 97.00%.   IV Fluids:  IV in place, occlusive dsg intact without redness, IV cath antecubital right, condition patent and no redness NSL.   Allergies:  Penicillins  Past Medical History:   has no past medical history on file.  Past Surgical History:   has no past surgical history on file.  Social History:   reports that he has been smoking.  He does not have any smokeless tobacco history on file. He reports that he drinks alcohol. He reports that he does not use illicit drugs.  Skin: warm dry intact   Patient/Family orientated to room. Side rails up x 2, fall assessment and education completed with patient/family. Patient/family able to verbalize understanding of risk associated with falls and verbalized understanding to call for assistance before getting out of bed. Call light within reach. Patient/family able to voice and demonstrate understanding of unit orientation instructions.    Will continue to evaluate and treat per MD orders.

## 2013-09-14 DIAGNOSIS — K089 Disorder of teeth and supporting structures, unspecified: Secondary | ICD-10-CM

## 2013-09-14 LAB — BASIC METABOLIC PANEL
BUN: 9 mg/dL (ref 6–23)
CHLORIDE: 103 meq/L (ref 96–112)
CO2: 23 mEq/L (ref 19–32)
CREATININE: 0.75 mg/dL (ref 0.50–1.35)
Calcium: 8.6 mg/dL (ref 8.4–10.5)
GFR calc non Af Amer: >90 mL/min (ref >90)
GLUCOSE: 233 mg/dL — AB (ref 70–99)
POTASSIUM: 3.6 meq/L — AB (ref 3.7–5.3)
Sodium: 139 mEq/L (ref 137–147)

## 2013-09-14 LAB — GLUCOSE, CAPILLARY
GLUCOSE-CAPILLARY: 316 mg/dL — AB (ref 70–99)
Glucose-Capillary: 250 mg/dL — ABNORMAL HIGH (ref 70–99)
Glucose-Capillary: 340 mg/dL — ABNORMAL HIGH (ref 70–99)
Glucose-Capillary: 252 mg/dL — ABNORMAL HIGH (ref 70–99)

## 2013-09-14 MED ORDER — INSULIN NPH ISOPHANE & REGULAR (70-30) 100 UNIT/ML ~~LOC~~ SUSP: 30.0000 [IU] | Freq: Two times a day (BID) | SUBCUTANEOUS | Status: DC

## 2013-09-14 MED ORDER — LURASIDONE HCL 20 MG PO TABS
20.0000 mg | ORAL_TABLET | Freq: Every day | ORAL | Status: DC
Start: 2013-09-14 — End: 2015-08-10

## 2013-09-14 MED ORDER — NICOTINE 14 MG/24HR TD PT24: 14.0000 mg | MEDICATED_PATCH | Freq: Every day | TRANSDERMAL | Status: DC

## 2013-09-14 MED ORDER — INSULIN ASPART PROT & ASPART (70-30 MIX) 100 UNIT/ML ~~LOC~~ SUSP
30.0000 [IU] | Freq: Two times a day (BID) | SUBCUTANEOUS | Status: DC
  Administered 2013-09-14: 30 [IU] via SUBCUTANEOUS
  Filled 2013-09-14: qty 10

## 2013-09-14 MED ORDER — GLUCOSE BLOOD VI STRP: ORAL_STRIP | Status: DC

## 2013-09-14 MED ORDER — LISINOPRIL 5 MG PO TABS: 5.0000 mg | ORAL_TABLET | Freq: Every day | ORAL | Status: DC

## 2013-09-14 MED ORDER — METRONIDAZOLE 500 MG PO TABS: 500.0000 mg | ORAL_TABLET | Freq: Three times a day (TID) | ORAL | Status: DC

## 2013-09-14 MED ORDER — DOXYCYCLINE HYCLATE 100 MG PO TABS: 100.0000 mg | ORAL_TABLET | Freq: Two times a day (BID) | ORAL | Status: DC

## 2013-09-14 MED ORDER — LIVING WELL WITH DIABETES BOOK: 1.0000 | Freq: Once | Status: DC

## 2013-09-14 MED ORDER — FLUOXETINE HCL 20 MG PO CAPS: 20.0000 mg | ORAL_CAPSULE | Freq: Every day | ORAL | Status: DC

## 2013-09-14 MED ORDER — LISINOPRIL 5 MG PO TABS
5.0000 mg | ORAL_TABLET | Freq: Every day | ORAL | Status: DC
  Administered 2013-09-15: 5 mg via ORAL
  Filled 2013-09-14: qty 1

## 2013-09-14 MED ORDER — INSULIN ASPART PROT & ASPART (70-30 MIX) 100 UNIT/ML ~~LOC~~ SUSP: 30.0000 [IU] | Freq: Two times a day (BID) | SUBCUTANEOUS | Status: DC

## 2013-09-14 MED ORDER — "INSULIN SYRINGE-NEEDLE U-100 28G X 1/2"" 0.3 ML MISC": 1.0000 | Freq: Three times a day (TID) | Status: DC

## 2013-09-14 MED ORDER — CIPROFLOXACIN HCL 500 MG PO TABS: 500.0000 mg | ORAL_TABLET | Freq: Two times a day (BID) | ORAL | Status: DC

## 2013-09-14 MED ORDER — RELION CONFIRM GLUCOSE MONITOR W/DEVICE KIT: 1.0000 | PACK | Freq: Three times a day (TID) | Status: DC

## 2013-09-14 MED ORDER — INSULIN ASPART PROT & ASPART (70-30 MIX) 100 UNIT/ML ~~LOC~~ SUSP
40.0000 [IU] | Freq: Two times a day (BID) | SUBCUTANEOUS | Status: DC
  Administered 2013-09-14: 40 [IU] via SUBCUTANEOUS
  Filled 2013-09-14: qty 10

## 2013-09-14 MED ORDER — INSULIN ASPART PROT & ASPART (70-30 MIX) 100 UNIT/ML ~~LOC~~ SUSP
36.0000 [IU] | Freq: Two times a day (BID) | SUBCUTANEOUS | Status: DC
  Filled 2013-09-14: qty 10

## 2013-09-14 NOTE — Discharge Instructions (Signed)
Blood Glucose Monitoring, Adult Monitoring your blood glucose (also know as blood sugar) helps you to manage your diabetes. It also helps you and your health care provider monitor your diabetes and determine how well your treatment plan is working. WHY SHOULD YOU MONITOR YOUR BLOOD GLUCOSE?  It can help you understand how food, exercise, and medicine affect your blood glucose.  It allows you to know what your blood glucose is at any given moment. You can quickly tell if you are having low blood glucose (hypoglycemia) or high blood glucose (hyperglycemia).  It can help you and your health care provider know how to adjust your medicines.  It can help you understand how to manage an illness or adjust medicine for exercise. WHEN SHOULD YOU TEST? Your health care provider will help you decide how often you should check your blood glucose. This may depend on the type of diabetes you have, your diabetes control, or the types of medicines you are taking. Be sure to write down all of your blood glucose readings so that this information can be reviewed with your health care provider. See below for examples of testing times that your health care provider may suggest. Type 1 Diabetes  Test 4 times a day if you are in good control, using an insulin pump, or perform multiple daily injections.  If your diabetes is not well-controlled or if you are sick, you may need to monitor more often.  It is a good idea to also monitor:  Before and after exercise.  Between meals and 2 hours after a meal.  Occasionally between 2:00 to 3:00 am. Type 2 Diabetes  It can vary with each person, but generally, if you are on insulin, test 4 times a day.  If you take medicines by mouth (orally), test 2 times a day.  If you are on a controlled diet, test once a day.  If your diabetes is not well controlled or if you are sick, you may need to monitor more often. HOW TO MONITOR YOUR BLOOD GLUCOSE Supplies  Needed  Blood glucose meter.  Test strips for your meter. Each meter has its own strips. You must use the strips that go with your own meter.  A pricking needle (lancet).  A device that holds the lancet (lancing device).  A journal or log book to write down your results. Procedure  Wash your hands with soap and water. Alcohol is not preferred.  Prick the side of your finger (not the tip) with the lancet.  Gently milk the finger until a small drop of blood appears.  Follow the instructions that come with your meter for inserting the test strip, applying blood to the strip, and using your blood glucose meter. Other Areas to Get Blood for Testing Some meters allow you to use other areas of your body (other than your finger) to test your blood. These areas are called alternative sites. The most common alternative sites are:  The forearm.  The thigh.  The back area of the lower leg.  The palm of the hand. The blood flow in these areas is slower. Therefore, the blood glucose values you get may be delayed, and the numbers are different from what you would get from your fingers. Do not use alternative sites if you think you are having hypoglycemia. Your reading will not be accurate. Always use a finger if you are having hypoglycemia. Also, if you cannot feel your lows (hypoglycemia unawareness), always use your fingers for your blood  glucose checks. ADDITIONAL TIPS FOR GLUCOSE MONITORING  Do not reuse lancets.  Always carry your supplies with you.  All blood glucose meters have a 24-hour "hotline" number to call if you have questions or need help.  Adjust (calibrate) your blood glucose meter with a control solution after finishing a few boxes of strips. BLOOD GLUCOSE RECORD KEEPING It is a good idea to keep a daily record or log of your blood glucose readings. Most glucose meters, if not all, keep your glucose records stored in the meter. Some meters come with the ability to download  your records to your home computer. Keeping a record of your blood glucose readings is especially helpful if you are wanting to look for patterns. Make notes to go along with the blood glucose readings because you might forget what happened at that exact time. Keeping good records helps you and your health care provider to work together to achieve good diabetes management.  Document Released: 08/06/2003 Document Revised: 04/05/2013 Document Reviewed: 12/26/2012 [facility] Patient Information 2014 [place], [place].  Diabetes and Sick Day Management Blood sugar (glucose) can be more difficult to control when you are sick. Colds, fever, flu, nausea, vomiting, and diarrhea are all examples of common illnesses that can cause problems for people with diabetes. Loss of body fluids (dehydration) from fever, vomiting, diarrhea, infection, and the stress of a sickness can all cause blood glucose levels to increase. Because of this, it is very important to take your diabetes medicines and to eat some form of carbohydrate food when you are sick. Liquid or soft foods are often tolerated, and they help to replace fluids. HOME CARE INSTRUCTIONS These main guidelines are intended for managing a short-term (24 hours or less) sickness:  Take your usual dose of insulin or oral diabetes medicine. An exception would be if you take any form of metformin. If you cannot eat or drink, you can become dehydrated and should not take this medicine.  Continue to take your insulin even if you are unable to eat solid foods or are vomiting. Your insulin dose may stay the same, or it may need to be increased when you are sick.  You will need to test your blood glucose more often, generally every 2 4 hours. If you have type 1 diabetes, test your urine for ketones every 4 hours. If you have type 2 diabetes, test your urine for ketones as directed by your health care provider.  Eat some form of food that contains carbohydrates. The  carbohydrates can be in solid or liquid form. You should eat 45 50 g of carbohydrates every 3 4 hours.  Replace fluids if you have a fever, vomit, or have diarrhea. Ask your health care provider for specific rehydration instructions.  Watch carefully for the signs of ketoacidosis if you have type 1 diabetes. Call your health care provider if any of the following symptoms are present, especially in children:  Moderate to large ketones in the urine along with a high blood glucose level.  Severe nausea.  Vomiting.  Diarrhea.  Abdominal pain.  Rapid breathing.  Drink extra liquids that do not contain sugar, such as water or sugar-free liquids, or caffeine.  Be careful with over-the-counter medicines. Read the labels. They may contain sugar or types of sugars that can increase your blood glucose level. Food Choices for Illness All of the food choices below contain about 15 g of carbohydrates. Plan ahead and keep some of these foods around.    to  cup carbonated beverage containing sugar. Carbonated beverages will usually be better tolerated if they are opened and left at room temperature for a few minutes.   of a twin frozen ice pop.   cup regular gelatin.   cup juice.   cup ice cream or frozen yogurt.   cup cooked cereal.   cup sherbet.  1 cup clear broth or soup.  1 cup cream soup.   cup regular custard.   cup regular pudding.  1 cup sports drink.  1 cup plain yogurt.  1 slice toast.  6 squares saltine crackers.  5 vanilla wafers.   cup sports drink. SEEK MEDICAL CARE IF:   You are unable to drink fluids, even small amounts.  You have nausea and vomiting for more than 6 hours.  You have diarrhea for more than 6 hours.  Your blood glucose level is more than 240 mg/dL, even with additional insulin.  There is a change in mental status.  You develop an additional serious sickness.  You have been sick for 2 days and are not getting  better.  You have had a fever for 2 days. SEEK IMMEDIATE MEDICAL CARE IF:  You have difficulty breathing.  You have moderate to large ketone levels. MAKE SURE YOU:  Understand these instructions.  Will watch your condition.  Will get help right away if you are not doing well or get worse. Document Released: 08/06/2003 Document Revised: 04/05/2013 Document Reviewed: 01/10/2013 [facility] Patient Information 2014 [place], [place].  Diabetes and Exercise Exercising regularly is important. It is not just about losing weight. It has many health benefits, such as:  Improving your overall fitness, flexibility, and endurance.  Increasing your bone density.  Helping with weight control.  Decreasing your body fat.  Increasing your muscle strength.  Reducing stress and tension.  Improving your overall health. People with diabetes who exercise gain additional benefits because exercise:  Reduces appetite.  Improves the body's use of blood sugar (glucose).  Helps lower or control blood glucose.  Decreases blood pressure.  Helps control blood lipids (such as cholesterol and triglycerides).  Improves the body's use of the hormone insulin by:  Increasing the body's insulin sensitivity.  Reducing the body's insulin needs.  Decreases the risk for heart disease because exercising:  Lowers cholesterol and triglycerides levels.  Increases the levels of good cholesterol (such as high-density lipoproteins [HDL]) in the body.  Lowers blood glucose levels. YOUR ACTIVITY PLAN  Choose an activity that you enjoy and set realistic goals. Your health care provider or diabetes educator can help you make an activity plan that works for you. You can break activities into 2 or 3 sessions throughout the day. Doing so is as good as one long session. Exercise ideas include:  Taking the dog for a walk.  Taking the stairs instead of the elevator.  Dancing to your favorite song.  Doing your  favorite exercise with a friend. RECOMMENDATIONS FOR EXERCISING WITH TYPE 1 OR TYPE 2 DIABETES   Check your blood glucose before exercising. If blood glucose levels are greater than 240 mg/dL, check for urine ketones. Do not exercise if ketones are present.  Avoid injecting insulin into areas of the body that are going to be exercised. For example, avoid injecting insulin into:  The arms when playing tennis.  The legs when jogging.  Keep a record of:  Food intake before and after you exercise.  Expected peak times of insulin action.  Blood glucose levels before and after you  exercise.  The type and amount of exercise you have done.  Review your records with your health care provider. Your health care provider will help you to develop guidelines for adjusting food intake and insulin amounts before and after exercising.  If you take insulin or oral hypoglycemic agents, watch for signs and symptoms of hypoglycemia. They include:  Dizziness.  Shaking.  Sweating.  Chills.  Confusion.  Drink plenty of water while you exercise to prevent dehydration or heat stroke. Body water is lost during exercise and must be replaced.  Talk to your health care provider before starting an exercise program to make sure it is safe for you. Remember, almost any type of activity is better than none. Document Released: 10/24/2003 Document Revised: 04/05/2013 Document Reviewed: 01/10/2013 [facility] Patient Information 2014 [place], [place].  Diabetes and Small Vessel Disease Small vessel disease (microvascular disease) includes nephropathy, retinopathy, and neuropathy. People with diabetes are at risk for these problems, but keeping blood glucose (sugar) controlled is helpful in preventing problems. DIABETIC KIDNEY PROBLEMS (DIABETIC NEPHROPATHY)  Diabetic nephropathy occurs in many patients with diabetes.  Damage to the small vessels in the kidneys is the leading cause of end-stage renal disease  (ESRD).  Protein in the urine (albuminuria) in the range of 30 to 300 mg/24 h (microalbuminuria) is a sign of the earliest stage of diabetic nephropathy.  Good blood glucose (sugar) and blood pressure control significantly reduce the progression of nephropathy. DIABETIC EYE PROBLEMS (DIABETIC RETINOPATHY)  Diabetic retinopathy is the most common cause of new cases of blindness in adults. It is related to the number of years you have had diabetes.  Common risk factors include high blood sugar (hyperglycemia), high blood pressure (hypertension), and poorly controlled blood lipids such as high blood cholesterol (hypercholesterolemia). DIABETIC NERVE PROBLEMS (DIABETIC NEUROPATHY) Diabetic neuropathy is the most common, long-term complication of diabetes. It is responsible for more than half of leg amputations not due to accidents. The main risk for developing diabetic neuropathy seems to be uncontrolled blood sugars. Hyperglycemia damages the nerve fibers causing sensation (feeling) problems. The closer you can keep the following guidelines, the better chance you will have avoiding problems from small vessel disease.  Working toward near normal blood glucose or as normal as possible. You will need to keep your blood glucose and A1c at the target range prescribed by your caregiver.  Keep your blood pressure less than 120/80.  Keep your low-density lipoprotein (LDL) cholesterol (one of the fats in your blood) at less than 100 mg/dL. An LDL less than 70 mg/dL may be recommended for high risk patients. You cannot change your family history, but it is important to change the risk factors that you can. Risk factors you can control include:  Controlling high blood pressure.  Stopping smoking.  Using alcohol only in moderation. Generally, this means about one drink per day for women and two drinks per day for men.  Controlling your blood lipids (cholesterol and triglycerides).  Treating heart  problems, if these are contributing to risk. SEEK MEDICAL CARE IF:   You are having problems keeping your blood glucose in goal range.  You notice a change in your vision or new problems with your vision.  You have wound or sore that does not heal.  Your blood pressure is above the target range. Document Released: 08/06/2003 Document Revised: 07/20/2012 Document Reviewed: 01/11/2009 [facility] Patient Information 2014 [place], [place].  Diabetes, Eating Away From Home Sometimes, you might eat in a restaurant or have meals  that are prepared by someone else. You can enjoy eating out. However, the portions in restaurants may be much larger than needed. Listed below are some ideas to help you choose foods that will keep your blood glucose (sugar) in better control.  TIPS FOR EATING OUT  Know your meal plan and how many carbohydrate servings you should have at each meal. You may wish to carry a copy of your meal plan in your purse or wallet. Learn the foods included in each food group.  Make a list of restaurants near you that offer healthy choices. Take a copy of the carry-out menus to see what they offer. Then, you can plan what you will order ahead of time.  Become familiar with serving sizes by practicing them at home using measuring cups and spoons. Once you learn to recognize portion sizes, you will be able to correctly estimate the amount of total carbohydrate you are allowed to eat at the restaurant. Ask for a takeout box if the portion is more than you should have. When your food comes, leave the amount you should have on the plate, and put the rest in the takeout box before you start eating.  Plan ahead if your mealtime will be different from usual. Check with your caregiver to find out how to time meals and medicine if you are taking insulin.  Avoid high-fat foods, such as fried foods, cream sauces, high-fat salad dressings, or any added butter or margarine.  Do not be afraid to ask  questions. Ask your server about the portion size, cooking methods, ingredients and if items can be substituted. Restaurants do not list all available items on the menu. You can ask for your main entree to be prepared using skim milk, oil instead of butter or margarine, and without gravy or sauces. Ask your waiter or waitress to serve salad dressings, gravy, sauces, margarine, and sour cream on the side. You can then add the amount your meal plan suggests.  Add more vegetables whenever possible.  Avoid items that are labeled "jumbo," "giant," "deluxe," or "supersized."  You may want to split an entre with someone and order an extra side salad.  Watch for hidden calories in foods like croutons, bacon, or cheese.  Ask your server to take away the bread basket or chips from your table.  Order a dinner salad as an appetizer. You can eat most foods served in a restaurant. Some foods are better choices than others. Breads and Starches  Recommended: All kinds of bread (wheat, rye, white, oatmeal, [place], [place], raisin), hard or soft dinner rolls, frankfurter or hamburger buns, small bagels, small corn or whole-wheat flour tortillas.  Avoid: Frosted or glazed breads, butter rolls, egg or cheese breads, croissants, sweet rolls, pastries, coffee cake, glazed or frosted doughnuts, muffins. Crackers  Recommended: Animal crackers, graham, rye, saltine, oyster, and matzoth crackers. Bread sticks, melba toast, rusks, pretzels, popcorn (without fat), zwieback toast.  Avoid: High-fat snack crackers or chips. Buttered popcorn. Cereals  Recommended: Hot and cold cereals. Whole grains such as oatmeal or shredded wheat are good choices.  Avoid: Sugar-coated or granola type cereals. Potatoes/Pasta/Rice/Beans  Recommended: Order baked, boiled, or mashed potatoes, rice or noodles without added fat, whole beans. Order gravies, butter, margarine, or sauces on the side so you can control the amount you  add.  Avoid: Hash browns or fried potatoes. Potatoes, pasta, or rice prepared with cream or cheese sauce. Potato or pasta salads prepared with large amounts of dressing. Fried beans  or fried rice. Vegetables  Recommended: Order steamed, baked, boiled, or stewed vegetables without sauces or extra fat. Ask that sauce be served on the side. If vegetables are not listed on the menu, ask what is available.  Avoid: Vegetables prepared with cream, butter, or cheese sauce. Fried vegetables. Salad Bars  Recommended: Many of the vegetables at a salad bar are considered "free." Use lemon juice, vinegar, or low-calorie salad dressing (fewer than 20 calories per serving) as "free" dressings for your salad. Look for salad bar ingredients that have no added fat or sugar such as tomatoes, lettuce, cucumbers, broccoli, carrots, onions, and mushrooms.  Avoid: Prepared salads with large amounts of dressing, such as coleslaw, caesar salad, macaroni salad, bean salad, or carrot salad. Fruit  Recommended: Eat fresh fruit or fresh fruit salad without added dressing. A salad bar often offers fresh fruit choices, but canned fruit at a restaurant is usually packed in sugar or syrup.  Avoid: Sweetened canned or frozen fruits, plain or sweetened fruit juice. Fruit salads with dressing, sour cream, or sugar added to them. Meat and Meat Substitutes  Recommended: Order broiled, baked, roasted, or grilled meat, poultry, or fish. Trim off all visible fat. Do not eat the skin of poultry. The size stated on the menu is the raw weight. Meat shrinks by  in cooking (for example, 4 oz raw equals 3 oz cooked meat).  Avoid: Deep-fat fried meat, poultry, or fish. Breaded meats. Eggs  Recommended: Order soft, hard-cooked, poached, or scrambled eggs. Omelets may be okay, depending on what ingredients are added. Egg substitutes are also a good choice.  Avoid: Fried eggs, eggs prepared with cream or cheese  sauce. Milk  Recommended: Order low-fat or fat-free milk according to your meal plan. Plain, nonfat yogurt or flavored yogurt with no sugar added may be used as a substitute for milk. Soy milk may also be used.  Avoid: Milk shakes or sweetened milk beverages. Soups and Combination Foods  Recommended: Clear broth or consomm are "free" foods and may be used as an appetizer. Broth-based soups with fat removed count as a starch serving and are preferred over cream soups. Soups made with beans or split peas may be eaten but count as a starch.  Avoid: Fatty soups, soup made with cream, cheese soup. Combination foods prepared with excessive amounts of fat or with cream or cheese sauces. Desserts and Sweets  Recommended: Ask for fresh fruit. Sponge or angel food cake without icing, ice milk, no sugar added ice cream, sherbet, or frozen yogurt may fit into your meal plan occasionally.  Avoid: Pastries, puddings, pies, cakes with icing, custard, gelatin desserts. Fats and Oils  Recommended: Choose healthy fats such as olive oil, canola oil, or tub margarine, reduced fat or fat-free sour cream, cream cheese, avocado, or nuts.  Avoid: Any fats in excess of your allowed portion. Deep-fried foods or any food with a large amount of fat. Note: Ask for all fats to be served on the side, and limit your portion sizes according to your meal plan. Document Released: 08/03/2005 Document Revised: 10/26/2011 Document Reviewed: 02/21/2009 [facility] Patient Information 2014 [place].  Diabetes Meal Planning Guide The diabetes meal planning guide is a tool to help you plan your meals and snacks. It is important for people with diabetes to manage their blood glucose (sugar) levels. Choosing the right foods and the right amounts throughout your day will help control your blood glucose. Eating right can even help you improve your blood pressure  and reach or maintain a healthy weight. CARBOHYDRATE COUNTING MADE  EASY When you eat carbohydrates, they turn to sugar. This raises your blood glucose level. Counting carbohydrates can help you control this level so you feel better. When you plan your meals by counting carbohydrates, you can have more flexibility in what you eat and balance your medicine with your food intake. Carbohydrate counting simply means adding up the total amount of carbohydrate grams in your meals and snacks. Try to eat about the same amount at each meal. Foods with carbohydrates are listed below. Each portion below is 1 carbohydrate serving or 15 grams of carbohydrates. Ask your dietician how many grams of carbohydrates you should eat at each meal or snack. Grains and Starches  1 slice bread.   English muffin or hotdog/hamburger bun.   cup cold cereal (unsweetened).   cup cooked pasta or rice.   cup starchy vegetables (corn, potatoes, peas, beans, winter squash).  1 tortilla (6 inches).   bagel.  1 waffle or pancake (size of a CD).   cup cooked cereal.  4 to 6 small crackers. *Whole grain is recommended. Fruit  1 cup fresh unsweetened berries, melon, papaya, pineapple.  1 small fresh fruit.   banana or mango.   cup fruit juice (4 oz unsweetened).   cup canned fruit in natural juice or water.  2 tbs dried fruit.  12 to 15 grapes or cherries. Milk and Yogurt  1 cup fat-free or 1% milk.  1 cup soy milk.  6 oz light yogurt with sugar-free sweetener.  6 oz low-fat soy yogurt.  6 oz plain yogurt. Vegetables  1 cup raw or  cup cooked is counted as 0 carbohydrates or a "free" food.  If you eat 3 or more servings at 1 meal, count them as 1 carbohydrate serving. Other Carbohydrates   oz chips or pretzels.   cup ice cream or frozen yogurt.   cup sherbet or sorbet.  2 inch square cake, no frosting.  1 tbs honey, sugar, jam, jelly, or syrup.  2 small cookies.  3 squares of graham crackers.  3 cups popcorn.  6 crackers.  1 cup  broth-based soup.  Count 1 cup casserole or other mixed foods as 2 carbohydrate servings.  Foods with less than 20 calories in a serving may be counted as 0 carbohydrates or a "free" food. You may want to purchase a book or computer software that lists the carbohydrate gram counts of different foods. In addition, the nutrition facts panel on the labels of the foods you eat are a good source of this information. The label will tell you how big the serving size is and the total number of carbohydrate grams you will be eating per serving. Divide this number by 15 to obtain the number of carbohydrate servings in a portion. Remember, 1 carbohydrate serving equals 15 grams of carbohydrate. SERVING SIZES Measuring foods and serving sizes helps you make sure you are getting the right amount of food. The list below tells how big or small some common serving sizes are.  1 oz.........4 stacked dice.  3 oz........Marland KitchenDeck of cards.  1 tsp.......Marland KitchenTip of little finger.  1 tbs......Marland KitchenMarland KitchenThumb.  2 tbs.......Marland KitchenGolf ball.   cup......Marland KitchenHalf of a fist.  1 cup.......Marland KitchenA fist. SAMPLE DIABETES MEAL PLAN Below is a sample meal plan that includes foods from the grain and starches, dairy, vegetable, fruit, and meat groups. A dietician can individualize a meal plan to fit your calorie needs and tell you the  number of servings needed from each food group. However, controlling the total amount of carbohydrates in your meal or snack is more important than making sure you include all of the food groups at every meal. You may interchange carbohydrate containing foods (dairy, starches, and fruits). The meal plan below is an example of a 2000 calorie diet using carbohydrate counting. This meal plan has 17 carbohydrate servings. Breakfast  1 cup oatmeal (2 carb servings).   cup light yogurt (1 carb serving).  1 cup blueberries (1 carb serving).   cup almonds. Snack  1 large apple (2 carb servings).  1 low-fat string  cheese stick. Lunch  Chicken breast salad.  1 cup spinach.   cup chopped tomatoes.  2 oz chicken breast, sliced.  2 tbs low-fat [place] dressing.  12 whole-wheat crackers (2 carb servings).  12 to 15 grapes (1 carb serving).  1 cup low-fat milk (1 carb serving). Snack  1 cup carrots.   cup hummus (1 carb serving). Dinner  3 oz broiled salmon.  1 cup brown rice (3 carb servings). Snack  1  cups steamed broccoli (1 carb serving) drizzled with 1 tsp olive oil and lemon juice.  1 cup light pudding (2 carb servings). DIABETES MEAL PLANNING WORKSHEET Your dietician can use this worksheet to help you decide how many servings of foods and what types of foods are right for you.  BREAKFAST Food Group and Servings / Carb Servings Grain/Starches __________________________________ Dairy __________________________________________ Vegetable ______________________________________ Fruit ___________________________________________ Meat __________________________________________ Fat ____________________________________________ LUNCH Food Group and Servings / Carb Servings Grain/Starches ___________________________________ Dairy ___________________________________________ Fruit ____________________________________________ Meat ___________________________________________ Fat _____________________________________________ Laural Golden Food Group and Servings / Carb Servings Grain/Starches ___________________________________ Dairy ___________________________________________ Fruit ____________________________________________ Meat ___________________________________________ Fat _____________________________________________ SNACKS Food Group and Servings / Carb Servings Grain/Starches ___________________________________ Dairy ___________________________________________ Vegetable _______________________________________ Fruit ____________________________________________ Meat  ___________________________________________ Fat _____________________________________________ DAILY TOTALS Starches _________________________ Vegetable ________________________ Fruit ____________________________ Dairy ____________________________ Meat ____________________________ Fat ______________________________ Document Released: 04/30/2005 Document Revised: 10/26/2011 Document Reviewed: 03/11/2009 Exit[employer] Patient Information 2014 [place] [employer]  Serving Sizes What we call a serving size today is larger than it was in the past. A 1950s fast-food burger contained little more than 1 oz of meat, and a soft drink was 8 oz (1 cup). Today, a "quarter pounder" burger is at least 4 times that amount, and a 32 or 64 oz drink is not uncommon. A possible guide for eating when trying to lose weight is to eat about half as much as you normally do. Some estimates of serving sizes are:  1 Dairy serving:Individual container of yogurt (8 oz) or piece of cheese the size of your thumb (1 oz).  1 Grain serving: 1 slice of bread or  cup pasta.  1 Meat serving: The size of a deck of cards (3 oz).  1 Fruit serving: cup canned fruit or 1 medium fruit.  1 Vegetable serving:  cup of cooked or canned vegetables.  1 Fat serving:The size of 4 stacked dimes. Experts suggest spending 1 or 2 days measuring food portions you commonly eat. This will give you better practice at estimating serving sizes, and will also show whether you are eating an appropriate amount of food to meet your weight goals. If you find that you are eating more than you thought, try measuring your food for a few days so you can "reprogram" yourself to learn what makes a healthy portion for you. SUGGESTIONS FOR CONTROL  In restaurants, share entrees, or ask the waiter to put half  the entre in a box or bag before you even touch it.  Order lunch-sized portions. Many restaurants serve 4 to 6 oz of meat at lunch, compared with 8 to 10 oz  at dinner.  Split dessert or skip it all together. Have a piece of fruit when you get home.  At home, use smaller plates and bowls. It will look as if you are eating more.  Plate your food in the kitchen rather than serving it "family style" at the table.  Wait 20 to 30 minutes before taking seconds. This is how long it takes your brain to recognize that you are full.  Check food labels for serving sizes. Eat 1 serving only.  Use measuring cups and spoons to see proper serving sizes.  Buy smaller packages of candy, popcorn, and snacks.  Avoid eating directly out of the bag or carton.  While eating half as much, exercise twice as much. Park further away from the mall, take the stairs instead of the escalator, and walk around your block. Losing weight is a slow, difficult process. It takes long-lasting lifestyle changes. You can make gradual changes over time so they become habits. Look to friends and family to support the healthy changes you are making. Avoid fad diets since they are often only temporary weight loss solutions. Document Released: 05/02/2003 Document Revised: 10/26/2011 Document Reviewed: 06/11/2009 [facility] Patient Information 2014 [place], [place].  Daily Diabetes Record Week of _____________________________ Date: _________  Seward Carol, BG/Medications: ________________ / __________________________________________________________  LUNCH, BG/Medications: ____________________ / __________________________________________________________  Laural Golden, BG/Medications: ___________________ / __________________________________________________________  BEDTIME, BG/Medications: __________________ / __________________________________________________________ Date: _________  Seward Carol, BG/Medications: ________________ / __________________________________________________________  LUNCH, BG/Medications: ____________________ /  __________________________________________________________  Laural Golden, BG/Medications: ___________________ / __________________________________________________________  BEDTIME, BG/Medications: __________________ / __________________________________________________________ Date: _________  Seward Carol, BG/Medications: ________________ / __________________________________________________________  LUNCH, BG/Medications: ____________________ / __________________________________________________________  Laural Golden, BG/Medications: ___________________ / __________________________________________________________  BEDTIME, BG/Medications: __________________ / __________________________________________________________ Date: _________  Seward Carol, BG/Medications: ________________ / __________________________________________________________  LUNCH, BG/Medications: ____________________ / __________________________________________________________  Laural Golden, BG/Medications: ___________________ / __________________________________________________________  BEDTIME, BG/Medications: __________________ / __________________________________________________________ Date: _________  Seward Carol, BG/Medications: ________________ / __________________________________________________________  LUNCH, BG/Medications: ____________________ / __________________________________________________________  Laural Golden, BG/Medications: ___________________ / __________________________________________________________  BEDTIME, BG/Medications: __________________ / __________________________________________________________ Date: _________  Seward Carol, BG/Medications: ________________ / __________________________________________________________  LUNCH, BG/Medications: ____________________ / __________________________________________________________  Laural Golden, BG/Medications: ___________________ /  __________________________________________________________  BEDTIME, BG/Medications: __________________ / __________________________________________________________ Date: _________  Seward Carol, BG/Medications: ________________ / __________________________________________________________  LUNCH, BG/Medications: ____________________ / __________________________________________________________  Laural Golden, BG/Medications: ___________________ / __________________________________________________________  BEDTIME, BG/Medications: __________________ / __________________________________________________________ Notes: __________________________________________________________________________________________________ Document Released: 07/07/2004 Document Revised: 10/26/2011 Document Reviewed: 05/01/2009 ExitCare Patient Information 2014 [place].  Insulin Storage and Care All insulin pens and bottles (vials) have expiration dates. Refrigerated, unopened insulin pens, insulin cartridges, and insulin vials are good until the expiration date. Do not use insulin after this date. Once insulin is opened, it should be used within a certain time period. Opened means once the rubber is punctured. Always follow the instructions that come with your insulin. STORING AND CARING FOR YOUR INSULIN  If insulin is kept at room temperature, the temperature must be less than 2F (30C). Some types of insulin can be stored only at less than 52F (25C).  If insulin is kept in the refrigerator, the temperature must be between 48F and 30F (3C and 8C).  Do not freeze insulin.  Keep insulin away from direct heat or sunlight.  Throw away the insulin if it is discolored, thick, or has clumps or suspended white particles in  it.  Be sure to mix cloudy insulin by rolling between your hands gently. Pens can be "rocked" from end to end.  Opened insulin pens should be kept at room temperature.  Always have extra  insulin on hand.  Never leave insulin in your vehicle. Document Released: 05/31/2009 Document Revised: 04/05/2013 Document Reviewed: 12/22/2012 [facility] Patient Information 2014 [place], [place]. Hypertension As your heart beats, it forces blood through your arteries. This force is your blood pressure. If the pressure is too high, it is called hypertension (HTN) or high blood pressure. HTN is dangerous because you may have it and not know it. High blood pressure may mean that your heart has to work harder to pump blood. Your arteries may be narrow or stiff. The extra work puts you at risk for heart disease, stroke, and other problems.  Blood pressure consists of two numbers, a higher number over a lower, 110/72, for example. It is stated as "110 over 72." The ideal is below 120 for the top number (systolic) and under 80 for the bottom (diastolic). Write down your blood pressure today. You should pay close attention to your blood pressure if you have certain conditions such as:  Heart failure.  Prior heart attack.  Diabetes  Chronic kidney disease.  Prior stroke.  Multiple risk factors for heart disease. To see if you have HTN, your blood pressure should be measured while you are seated with your arm held at the level of the heart. It should be measured at least twice. A one-time elevated blood pressure reading (especially in the Emergency Department) does not mean that you need treatment. There may be conditions in which the blood pressure is different between your right and left arms. It is important to see your caregiver soon for a recheck. Most people have essential hypertension which means that there is not a specific cause. This type of high blood pressure may be lowered by changing lifestyle factors such as:  Stress.  Smoking.  Lack of exercise.  Excessive weight.  Drug/tobacco/alcohol use.  Eating less salt. Most people do not have symptoms from high blood pressure until it  has caused damage to the body. Effective treatment can often prevent, delay or reduce that damage. TREATMENT  When a cause has been identified, treatment for high blood pressure is directed at the cause. There are a large number of medications to treat HTN. These fall into several categories, and your caregiver will help you select the medicines that are best for you. Medications may have side effects. You should review side effects with your caregiver. If your blood pressure stays high after you have made lifestyle changes or started on medicines,   Your medication(s) may need to be changed.  Other problems may need to be addressed.  Be certain you understand your prescriptions, and know how and when to take your medicine.  Be sure to follow up with your caregiver within the time frame advised (usually within two weeks) to have your blood pressure rechecked and to review your medications.  If you are taking more than one medicine to lower your blood pressure, make sure you know how and at what times they should be taken. Taking two medicines at the same time can result in blood pressure that is too low. SEEK IMMEDIATE MEDICAL CARE IF:  You develop a severe headache, blurred or changing vision, or confusion.  You have unusual weakness or numbness, or a faint feeling.  You have severe chest or abdominal pain, vomiting,  or breathing problems. MAKE SURE YOU:   Understand these instructions.  Will watch your condition.  Will get help right away if you are not doing well or get worse. Document Released: 08/03/2005 Document Revised: 10/26/2011 Document Reviewed: 03/23/2008 [facility] Patient Information 2014 [place], [place]. Lisinopril tablets What is this medicine? LISINOPRIL (lyse IN oh pril) is an ACE inhibitor. This medicine is used to treat high blood pressure and heart failure. It is also used to protect the heart immediately after a heart attack. This medicine may be used for other  purposes; ask your health care provider or pharmacist if you have questions. COMMON BRAND NAME(S): Prinivil, Zestril What should I tell my health care provider before I take this medicine? They need to know if you have any of these conditions: -diabetes -heart or blood vessel disease -immune system disease like lupus or scleroderma -kidney disease -low blood pressure -previous swelling of the tongue, face, or lips with difficulty breathing, difficulty swallowing, hoarseness, or tightening of the throat -an unusual or allergic reaction to lisinopril, other ACE inhibitors, insect venom, foods, dyes, or preservatives -pregnant or trying to get pregnant -breast-feeding How should I use this medicine? Take this medicine by mouth with a glass of water. Follow the directions on your prescription label. You may take this medicine with or without food. Take your medicine at regular intervals. Do not stop taking this medicine except on the advice of your doctor or health care professional. Talk to your pediatrician regarding the use of this medicine in children. Special care may be needed. While this drug may be prescribed for children as young as 53 years of age for selected conditions, precautions do apply. Overdosage: If you think you have taken too much of this medicine contact a poison control center or emergency room at once. NOTE: This medicine is only for you. Do not share this medicine with others. What if I miss a dose? If you miss a dose, take it as soon as you can. If it is almost time for your next dose, take only that dose. Do not take double or extra doses. What may interact with this medicine? -diuretics -lithium -NSAIDs, medicines for pain and inflammation, like ibuprofen or naproxen -over-the-counter herbal supplements like hawthorn -potassium salts or potassium supplements -salt substitutes This list may not describe all possible interactions. Give your health care provider a list  of all the medicines, herbs, non-prescription drugs, or dietary supplements you use. Also tell them if you smoke, drink alcohol, or use illegal drugs. Some items may interact with your medicine. What should I watch for while using this medicine? Visit your doctor or health care professional for regular check ups. Check your blood pressure as directed. Ask your doctor what your blood pressure should be, and when you should contact him or her. Call your doctor or health care professional if you notice an irregular or fast heart beat. Women should inform their doctor if they wish to become pregnant or think they might be pregnant. There is a potential for serious side effects to an unborn child. Talk to your health care professional or pharmacist for more information. Check with your doctor or health care professional if you get an attack of severe diarrhea, nausea and vomiting, or if you sweat a lot. The loss of too much body fluid can make it dangerous for you to take this medicine. You may get drowsy or dizzy. Do not drive, use machinery, or do anything that needs mental alertness until you  know how this drug affects you. Do not stand or sit up quickly, especially if you are an older patient. This reduces the risk of dizzy or fainting spells. Alcohol can make you more drowsy and dizzy. Avoid alcoholic drinks. Avoid salt substitutes unless you are told otherwise by your doctor or health care professional. Do not treat yourself for coughs, colds, or pain while you are taking this medicine without asking your doctor or health care professional for advice. Some ingredients may increase your blood pressure. What side effects may I notice from receiving this medicine? Side effects that you should report to your doctor or health care professional as soon as possible: -abdominal pain with or without nausea or vomiting -allergic reactions like skin rash or hives, swelling of the hands, feet, face, lips, throat, or  tongue -dark urine -difficulty breathing -dizzy, lightheaded or fainting spell -fever or sore throat -irregular heart beat, chest pain -pain or difficulty passing urine -redness, blistering, peeling or loosening of the skin, including inside the mouth -unusually weak -yellowing of the eyes or skin Side effects that usually do not require medical attention (report to your doctor or health care professional if they continue or are bothersome): -change in taste -cough -decreased sexual function or desire -headache -sun sensitivity -tiredness This list may not describe all possible side effects. Call your doctor for medical advice about side effects. You may report side effects to FDA at 1-800-FDA-1088. Where should I keep my medicine? Keep out of the reach of children. Store at room temperature between 15 and 30 degrees C (59 and 86 degrees F). Protect from moisture. Keep container tightly closed. Throw away any unused medicine after the expiration date. NOTE: This sheet is a summary. It may not cover all possible information. If you have questions about this medicine, talk to your doctor, pharmacist, or health care provider.  2014, [employer]. (2008-02-06 17:36:32)

## 2013-09-14 NOTE — Plan of Care (Signed)
CBGs not well controlled on initial dose of 70/30- up to 371- will increase from 20 to 30 units BID. If well controlled this pm consider dc home.  Junious SilkAllison Ellis, ANP

## 2013-09-14 NOTE — Discharge Summary (Addendum)
Physician Discharge Summary  Brandon Mcneil RKY:[id number] DOB: 1980/12/11 DOA: 09/10/2013  PCP: No PCP- will be going to [place] date: 09/10/2013 Discharge date: 09/15/2013  Time spent: >30 minutes: started 1015 am-finished 1055 am  Recommendations for Outpatient Follow-up:  1. Follow up with the [facility] on Feb.10 th as scheduled  Discharge Diagnoses:  Active Problems:   DKA (diabetic ketoacidoses)-resolved   Newly diagnosed diabetes- started on insulin   Dehydration-resolved   Bipolar I disorder, most recent episode depressed   Tobacco use disorder   Pain, dental   Discharge Condition: stable  Diet recommendation: Carbohydrate Modified  Filed Weights   09/10/13 1752  Weight: 219 lb 3 oz (99.423 kg)    History of present illness:  33 year old male patient with a history significant for bipolar disorder. Presented to the ER with complaints of polyuria polydipsia for 2 weeks. Noted at least a 20 pound weight loss for the last 3 weeks.   In the emergency department the pt was found to be suffering w/ DKA. He had no prior hx of DM.   Hospital Course:  DKA / Newly diagnosed diabetes  -initially required insulin drip- anion  Gap was slow to close -Transitioned to 70/30 insulin on 1/28 - prefer to use Relion brand at discharge since patient does not have insurance  -Diabetes educator consulted/ordered for outpatient diabetes clinic follow up -pt preferred [facility] due to proximity to [facility] management has arranged for outpt f/u  -Hemoglobin A1c was 12.1  -Nutrition consulted regarding diet education for new diabetes  -CBGs still elevated- 70/30  increased from 20 to 40 units BID- am CBGs better on date of discharge but pm readings the day prior were >250-300 range. Md opted to further increase am dose to 50. Suspect when at home diet likely not as strict as IP and pt on Latuda which predisposes to hyperglycemia -Lunch CBG 190 on date of  discharge so OK to dc to home 09/15/13  Dehydration  -resolved w/ volume resuscitation   Bipolar I disorder, most recent episode depressed  -Continue preadmission medications and continue OP follow up with [facility]. Anette Guarneri can cause hyperglycemia but pt well controlled on this med so would adjust insulin as opposed to dc this med   Tobacco use disorder  -script given for Nicoderm  Pain, dental  -Started on Vibramycin this admit - will continue for a total of 10 days treatment- Vibramycin too expensive so will order Ciprofloxacin and Metrodiazanole since PCN allergic -Pain greatly improved but still recommend follow up with dentist after discharge - given financial status may need to be referred to a dental clinic as well   Procedures:  None  Consultations:  Diabetes Educator  Discharge Exam: Filed Vitals:   09/15/13 0433  BP: 144/82  Pulse: 73  Temp: 98.2 F (36.8 C)  Resp: 20   General: No acute respiratory distress  Lungs: Clear to auscultation bilaterally without wheezes or crackles, RA  Cardiovascular: Regular rate and rhythm without murmur gallop or rub normal S1 and S2, no peripheral edema or JVD  Abdomen: Nontender, nondistended, soft, bowel sounds positive, no rebound, no ascites, no appreciable mass  Musculoskeletal: No significant cyanosis, clubbing of bilateral lower extremities  Neurological: Alert and oriented x 3, moves all extremities x 4 without focal neurological deficits, CN 2-12 intact    Discharge Instructions      Discharge Orders   Future Appointments Provider Department Dept Phone   10/26/2013 10:00 AM  Anne Shutter, [place] [facility] [phone]   Future Orders Complete By Expires   Ambulatory referral to Nutrition and Diabetic Education  As directed    Comments:     New onset diabetes, [facility]       Medication List         acetaminophen 500 MG tablet   Commonly known as:  TYLENOL  Take 1,000 mg by mouth every 6 (six) hours as needed for moderate pain.     ciprofloxacin 500 MG tablet  Commonly known as:  CIPRO  Take 1 tablet (500 mg total) by mouth 2 (two) times daily.     dextromethorphan-guaiFENesin 30-600 MG per 12 hr tablet  Commonly known as:  MUCINEX DM  Take 1 tablet by mouth daily as needed for cough.     FLUoxetine 20 MG capsule  Commonly known as:  PROZAC  Take 1 capsule (20 mg total) by mouth daily. RESUME THIS AS YOU WERE TAKING AT HOME     glucose blood test strip  Commonly known as:  RELION GLUCOSE TEST STRIPS  Use as instructed     ibuprofen 200 MG tablet  Commonly known as:  ADVIL,MOTRIN  Take 600 mg by mouth every 6 (six) hours as needed for moderate pain.     insulin NPH-regular Human (70-30) 100 UNIT/ML injection  Commonly known as:  RELION 70/30  Inject 40-50 Units into the skin 2 (two) times daily with a meal.     Insulin Syringe-Needle U-100 28G X 1/2" 0.3 ML Misc  1 Syringe by Does not apply route 4 (four) times daily -  before meals and at bedtime.     lisinopril 5 MG tablet  Commonly known as:  PRINIVIL,ZESTRIL  Take 1 tablet (5 mg total) by mouth daily.     living well with diabetes book Misc  1 each by Does not apply route once.     Lurasidone HCl 20 MG Tabs  Take 1 tablet (20 mg total) by mouth daily with breakfast.     metroNIDAZOLE 500 MG tablet  Commonly known as:  FLAGYL  Take 1 tablet (500 mg total) by mouth 3 (three) times daily.     nicotine 14 mg/24hr patch  Commonly known as:  NICODERM CQ - dosed in mg/24 hours  Place 1 patch (14 mg total) onto the skin daily.     RELION CONFIRM GLUCOSE MONITOR W/DEVICE Kit  1 Device by Does not apply route 4 (four) times daily -  before meals and at bedtime.       Allergies  Allergen Reactions  . Penicillins Itching and Rash   Follow-up Information   Follow up with [facility] On 09/26/2013. (f/u appointment for  Feb. 10 1045- be sure to take picture ID and medication list along with letter for eligibility)    Contact information:   [address] [place], [place]       The results of significant diagnostics from this hospitalization (including imaging, microbiology, ancillary and laboratory) are listed below for reference.    Significant Diagnostic Studies: Dg Chest 2 View  09/10/2013   CLINICAL DATA:  Sternal chest pain and dizziness  EXAM: CHEST  2 VIEW  COMPARISON:  DG CHEST 2 VIEW dated 02/20/2008  FINDINGS: The heart size and mediastinal contours are within normal limits. Both lungs are clear. The visualized skeletal structures are unremarkable.  IMPRESSION: No active cardiopulmonary  disease.   Electronically Signed   By: Conchita Paris M.D.   On: 09/10/2013 18:46    Microbiology: Recent Results (from the past 240 hour(s))  RAPID STREP SCREEN     Status: None   Collection Time    09/10/13  5:57 PM      Result Value Range Status   Streptococcus, Group A Screen (Direct) NEGATIVE  NEGATIVE Final   Comment: (NOTE)     A Rapid Antigen test may result negative if the antigen level in the     sample is below the detection level of this test. The FDA has not     cleared this test as a stand-alone test therefore the rapid antigen     negative result has reflexed to a Group A Strep culture.  CULTURE, GROUP A STREP     Status: None   Collection Time    09/10/13  5:57 PM      Result Value Range Status   Specimen Description THROAT   Final   Special Requests NONE   Final   Culture     Final   Value: STREPTOCOCCUS,BETA HEMOLYTIC NOT GROUP A     Performed at [facility]   Report Status 09/12/2013 FINAL   Final  MRSA PCR SCREENING     Status: None   Collection Time    09/11/13  1:04 AM      Result Value Range Status   MRSA by PCR NEGATIVE  NEGATIVE Final   Comment:            The GeneXpert MRSA Assay (FDA     approved for NASAL specimens     only), is one  component of a     comprehensive MRSA colonization     surveillance program. It is not     intended to diagnose MRSA     infection nor to guide or     monitor treatment for     MRSA infections.     Labs: Basic Metabolic Panel:  Recent Labs Lab 09/10/13 2245 09/11/13 0045  09/12/13 1837 09/12/13 2226 09/13/13 0310 09/13/13 0540 09/14/13 0611  NA 134* 136*  < > 136* 133* 138 139 139  K 5.1 4.6  < > 3.4* 3.2* 3.8 3.3* 3.6*  CL 97 99  < > 105 101 105 106 103  CO2 11* 13*  < > 17* '19 20 20 23  ' GLUCOSE 409* 279*  < > 182* 109* 157* 174* 233*  BUN 18 16  < > 6 6 5* 5* 9  CREATININE 1.01 0.93  < > 0.64 0.69 0.71 0.65 0.75  CALCIUM 8.6 8.7  < > 8.4 8.5 8.4 8.2* 8.6  PHOS  --  2.7  --   --   --   --   --   --   < > = values in this interval not displayed. Liver Function Tests:  Recent Labs Lab 09/10/13 1757  AST 12  ALT 15  ALKPHOS 141*  BILITOT 0.4  PROT 8.8*  ALBUMIN 4.4   No results found for this basename: LIPASE, AMYLASE,  in the last 168 hours No results found for this basename: AMMONIA,  in the last 168 hours CBC:  Recent Labs Lab 09/10/13 0045 09/10/13 1757 09/10/13 2118  WBC 11.6* 10.7* 10.8*  HGB 15.8 17.2* 17.5*  HCT 41.8 45.4 46.6  MCV 81.3 81.7 81.3  PLT 224 250 234   Cardiac Enzymes: No results found for this basename: CKTOTAL,  CKMB, CKMBINDEX, TROPONINI,  in the last 168 hours BNP: BNP (last 3 results) No results found for this basename: PROBNP,  in the last 8760 hours CBG:  Recent Labs Lab 09/14/13 1210 09/14/13 1748 09/14/13 2115 09/15/13 0745 09/15/13 1202  GLUCAP 340* 316* 252* 130* 190*       Signed:  ELLIS,ALLISON L. ANP [facility] 09/15/2013, 12:17 PM   I have examined the patient, reviewed the chart and modified the above note which I agree with.   ELLIS,ALLISON L.,MD [phone] 09/15/2013, 12:17 PM

## 2013-09-15 DIAGNOSIS — E119 Type 2 diabetes mellitus without complications: Secondary | ICD-10-CM

## 2013-09-15 LAB — GLUCOSE, CAPILLARY
Glucose-Capillary: 130 mg/dL — ABNORMAL HIGH (ref 70–99)
Glucose-Capillary: 190 mg/dL — ABNORMAL HIGH (ref 70–99)

## 2013-09-15 MED ORDER — INSULIN ASPART PROT & ASPART (70-30 MIX) 100 UNIT/ML ~~LOC~~ SUSP
50.0000 [IU] | Freq: Every day | SUBCUTANEOUS | Status: DC
  Filled 2013-09-15: qty 10

## 2013-09-15 MED ORDER — INSULIN NPH ISOPHANE & REGULAR (70-30) 100 UNIT/ML ~~LOC~~ SUSP: 40.0000 [IU] | Freq: Two times a day (BID) | SUBCUTANEOUS | Status: DC

## 2013-09-15 MED ORDER — INSULIN ASPART PROT & ASPART (70-30 MIX) 100 UNIT/ML ~~LOC~~ SUSP: 40.0000 [IU] | Freq: Every day | SUBCUTANEOUS | Status: DC

## 2013-09-15 MED ORDER — INSULIN ASPART PROT & ASPART (70-30 MIX) 100 UNIT/ML ~~LOC~~ SUSP
50.0000 [IU] | Freq: Two times a day (BID) | SUBCUTANEOUS | Status: DC
  Administered 2013-09-15: 50 [IU] via SUBCUTANEOUS
  Filled 2013-09-15: qty 10

## 2013-09-15 NOTE — Progress Notes (Signed)
Inpatient Diabetes Program Recommendations  AACE/ADA: New Consensus Statement on Inpatient Glycemic Control (2013)  Target Ranges:  Prepandial:   less than 140 mg/dL      Peak postprandial:   less than 180 mg/dL (1-2 hours)      Critically ill patients:  140 - 180 mg/dL     Results for Joesph JulyMARSHALL, Kester E (MRN [id number]) as of 09/15/2013 08:47  Ref. Range 09/14/2013 07:43 09/14/2013 12:10 09/14/2013 17:48 09/14/2013 21:15  Glucose-Capillary Latest Range: 70-99 mg/dL 130250 (H) 865340 (H) 784316 (H) 252 (H)     Results for Joesph JulyMARSHALL, Odysseus E (MRN [id number]) as of 09/15/2013 08:47  Ref. Range 09/15/2013 07:45  Glucose-Capillary Latest Range: 70-99 mg/dL 132130 (H)     **Patient received 30 units 70/30 insulin yesterday morning and 40 units 70/30 insulin last evening at suppertime.  AM CBG today 130 mg/dl.  Note that 70/30 insulin was increased to 50 units bid with meals today.  Concern that 50 units of 70/30 may be too much for this patient.   **MD- Please consider the following:  1.Decrease suppertime dose of 70/30 insulin to 45 units with supper 2. Leave AM dose of 70/30 insulin at 50 units Q breakfast   Will follow. Ambrose FinlandJeannine Johnston Jacquilyn Seldon RN, MSN, CDE Diabetes Coordinator Inpatient Diabetes Program Team Pager: [phone] (8a-10p)

## 2013-09-15 NOTE — Progress Notes (Signed)
`  09/15/13 Patient being discharged home today. IV site removed and discharge instructions reviewed.

## 2013-09-15 NOTE — Progress Notes (Signed)
Brandon Mcneil - Stepdown / ICU Progress Note  VANDY TSUCHIYA ZOX:[id number] DOB: 1981-02-16 DOA: Mcneil/25/2015 PCP: Pcp Not In System  Brief narrative: 33 year old male patient with a history significant for bipolar disorder. Presented to the ER with complaints of polyuria polydipsia for 2 weeks. Noted at least a 20 pound weight loss for the last 3 weeks.   In the emergency department the pt was found to be suffering w/ DKA.  He had no prior hx of DM.  Assessment/Plan:    DKA / Newly diagnosed diabetes -Transitioned to 70/30 insulin Mcneil/28 - prefer to use Relion brand since patient does not have insurance -Diabetes educator consulted/ordered for outpatient diabetes clinic follow up -Case management has arranged for outpt f/u  -Hemoglobin A1c 12.Mcneil  -Nutrition consulted regarding diet education for new diabetes -CBGs poorly controlled Mcneil/29 so dc delayed until today- 70/30 adjusted (see discharge summary)    Dehydration -resolved w/ volume resuscitation     Bipolar I disorder, most recent episode depressed -Continue home meds -Latuda can cause hyperglycemia but pt well controlled on this med so would adjust insulin as opposed to dc this med    Tobacco use disorder    Pain, dental -Started on Vibramycin this admit-changed to the more affordable Cipro/Flagyl combination at time of dc -If pain persists we'll need to follow up with dentist after discharge - given financial status may need to be referred to a dental clinic as well  DVT prophylaxis: Lovenox Code Status: Full Family Communication: No family at bedside Disposition Plan/Expected LOS: DISCHARGE to home  Physician Consultants: None  Procedures: None  Antibiotics: Vibramycin Mcneil/25 >>>  HPI/Subjective: Patient alert and without complaints.  Objective: Blood pressure 144/82, pulse 73, temperature 98.2 F (36.8 C), temperature source Oral, resp. rate 20, height 5\' 8"  (Mcneil.727 m), weight 219 lb 3 oz (99.423 kg),  SpO2 98.00%.  Intake/Output Summary (Last 24 hours) at 09/15/13 1217 Last data filed at 09/15/13 8119  Gross per 24 hour  Intake    480 ml  Output      0 ml  Net    480 ml   Exam: General: No acute respiratory distress Lungs: Clear to auscultation bilaterally without wheezes or crackles, RA Cardiovascular: Regular rate and rhythm without murmur gallop or rub normal S1 and S2, no peripheral edema or JVD Abdomen: Nontender, nondistended, soft, bowel sounds positive, no rebound, no ascites, no appreciable mass Musculoskeletal: No significant cyanosis, clubbing of bilateral lower extremities Neurological: Alert and oriented x 3, moves all extremities x 4 without focal neurological deficits, CN 2-12 intact  Scheduled Meds:  Scheduled Meds: . doxycycline  100 mg Oral Q12H  . enoxaparin (LOVENOX) injection  40 mg Subcutaneous Daily  . FLUoxetine  20 mg Oral Daily  . insulin aspart  0-5 Units Subcutaneous QHS  . insulin aspart  0-9 Units Subcutaneous TID WC  . insulin aspart protamine- aspart  40 Units Subcutaneous Q supper  . [START ON Mcneil/31/2015] insulin aspart protamine- aspart  50 Units Subcutaneous Q breakfast  . lisinopril  5 mg Oral Daily  . living well with diabetes book   Does not apply Once  . lurasidone  20 mg Oral Q breakfast  . nicotine  14 mg Transdermal Daily   Data Reviewed: Basic Metabolic Panel:  Recent Labs Lab 09/10/13 2245 09/11/13 0045  09/12/13 1837 09/12/13 2226 09/13/13 0310 09/13/13 0540 09/14/13 0611  NA 134* 136*  < > 136* 133* 138 139 139  K 5.Mcneil  4.6  < > 3.4* 3.2* 3.8 3.3* 3.6*  CL 97 99  < > 105 101 105 106 103  CO2 11* 13*  < > 17* 19 20 20 23   GLUCOSE 409* 279*  < > 182* 109* 157* 174* 233*  BUN 18 16  < > 6 6 5* 5* 9  CREATININE Mcneil.01 0.93  < > 0.64 0.69 0.71 0.65 0.75  CALCIUM 8.6 8.7  < > 8.4 8.5 8.4 8.2* 8.6  PHOS  --  2.7  --   --   --   --   --   --   < > = values in this interval not displayed. Liver Function Tests:  Recent Labs Lab  09/10/13 1757  AST 12  ALT 15  ALKPHOS 141*  BILITOT 0.4  PROT 8.8*  ALBUMIN 4.4   CBC:  Recent Labs Lab 09/10/13 0045 09/10/13 1757 09/10/13 2118  WBC 11.6* 10.7* 10.8*  HGB 15.8 17.2* 17.5*  HCT 41.8 45.4 46.6  MCV 81.3 81.7 81.3  PLT 224 250 234   CBG:  Recent Labs Lab 09/14/13 1210 09/14/13 1748 09/14/13 2115 09/15/13 0745 09/15/13 1202  GLUCAP 340* 316* 252* 130* 190*    Recent Results (from the past 240 hour(s))  RAPID STREP SCREEN     Status: None   Collection Time    09/10/13  5:57 PM      Result Value Range Status   Streptococcus, Group A Screen (Direct) NEGATIVE  NEGATIVE Final   Comment: (NOTE)     A Rapid Antigen test may result negative if the antigen level in the     sample is below the detection level of this test. The FDA has not     cleared this test as a stand-alone test therefore the rapid antigen     negative result has reflexed to a Group A Strep culture.  CULTURE, GROUP A STREP     Status: None   Collection Time    09/10/13  5:57 PM      Result Value Range Status   Specimen Description THROAT   Final   Special Requests NONE   Final   Culture     Final   Value: STREPTOCOCCUS,BETA HEMOLYTIC NOT GROUP A     Performed at [facility]   Report Status 09/12/2013 FINAL   Final  MRSA PCR SCREENING     Status: None   Collection Time    09/11/13  Mcneil:04 AM      Result Value Range Status   MRSA by PCR NEGATIVE  NEGATIVE Final   Comment:            The GeneXpert MRSA Assay (FDA     approved for NASAL specimens     only), is one component of a     comprehensive MRSA colonization     surveillance program. It is not     intended to diagnose MRSA     infection nor to guide or     monitor treatment for     MRSA infections.     Studies:  Recent x-ray studies have been reviewed in detail by the Attending Physician  Time spent : 20 mins     Junious Silk, ANP [facility] Office  [phone] Pager [phone]  **If  unable to reach the above provider after paging please contact the Flow Manager @ [phone]  On-Call/Text Page:      [email or web address]      password [email or web address]  If  7PM-7AM, please contact night-coverage [email or web address] Password [email or web address] Mcneil/30/2015, 12:17 PM   LOS: 5 days    I have examined the patient, reviewed the chart and modified the above note which I agree with.   Charlii Yost,MD [phone] Mcneil/30/2015, 5:53 PM

## 2013-09-15 NOTE — Care Management Note (Signed)
    Page 1 of 2   09/15/2013     11:28:48 AM   CARE MANAGEMENT NOTE 09/15/2013  Patient:  Brandon Mcneil,Brandon Mcneil   Account Number:  [id number]  Date Initiated:  09/12/2013  Documentation initiated by:  Donn PieriniWEBSTER,KRISTI  Subjective/Objective Assessment:   Pt admitted with DKA     Action/Plan:   PTA pt lived at home with family - independent   Anticipated DC Date:  09/15/2013   Anticipated DC Plan:  HOME/SELF CARE      DC Planning Services  CM consult  [facility]igent [facility] Clinic      Choice offered to / List presented to:             Status of service:  Completed, signed off Medicare Important Message given?   (If response is "NO", the following Medicare IM given date fields will be blank) Date Medicare IM given:   Date Additional Medicare IM given:    Discharge Disposition:  HOME/SELF CARE  Per UR Regulation:  Reviewed for med. necessity/level of care/duration of stay  If discussed at Long Length of Stay Meetings, dates discussed:    Comments:  09/15/13 11:26 Letha Capeeborah Yudith Norlander RN, BSN [phone] patient states he will need ast with medication, NCM explained to patient that he can only use this one time, patient stated he would like to use the Match Program.  NCM will give patient the Match Letter to ast with meds.  09/12/13- 1430- Donn PieriniKristi Webster RN, BSN [phone] Referral for PCP needs- pt does not have insurance- in to speak with pt regarding clinic options for - pt states that he has applied for disability and is in the appeals process- hoping to hear something by March. [facility] to [facility]/u with pt regarding disability questions and Medicaid potential- message left for [facility]. Also spoke with pt about which clinic would be most convenient for him to get to for f/u- [facility] and he feels would be the best location for him to get to for f/u. [facility] and f/u appointment made for Feb. 10 at 1045- pt will need to bring  picture ID and medication list. - Explained to pt that clinic has sliding scale fee and he would need to do eligibility to see what his fee would be- message was left with Willaim BanePamela Henry - the eligibility person at clinic- call returned and she spoke with pt by phone for eligibility and gave pt instructions on what he needed to bring to his appointment - fee will be $35- which pt states is doable. - will cont. to follow for medication needs- [facility]-

## 2013-10-26 ENCOUNTER — Ambulatory Visit: Payer: Self-pay | Admitting: *Deleted

## 2013-10-30 ENCOUNTER — Emergency Department (HOSPITAL_COMMUNITY)
Admission: EM | Admit: 2013-10-30 | Discharge: 2013-10-31 | Disposition: A | Discharge status: Home / Self Care | Payer: Self-pay | Attending: Emergency Medicine | Admitting: Emergency Medicine

## 2013-10-30 ENCOUNTER — Other Ambulatory Visit: Payer: Self-pay

## 2013-10-30 ENCOUNTER — Encounter (HOSPITAL_COMMUNITY): Payer: Self-pay | Admitting: Emergency Medicine

## 2013-10-30 DIAGNOSIS — T43502A Poisoning by unspecified antipsychotics and neuroleptics, intentional self-harm, initial encounter: Secondary | ICD-10-CM | POA: Insufficient documentation

## 2013-10-30 DIAGNOSIS — Z79899 Other long term (current) drug therapy: Secondary | ICD-10-CM | POA: Insufficient documentation

## 2013-10-30 DIAGNOSIS — F3289 Other specified depressive episodes: Secondary | ICD-10-CM | POA: Insufficient documentation

## 2013-10-30 DIAGNOSIS — T50901A Poisoning by unspecified drugs, medicaments and biological substances, accidental (unintentional), initial encounter: Secondary | ICD-10-CM

## 2013-10-30 DIAGNOSIS — Z880 Allergy status to penicillin: Secondary | ICD-10-CM | POA: Insufficient documentation

## 2013-10-30 DIAGNOSIS — I10 Essential (primary) hypertension: Secondary | ICD-10-CM | POA: Insufficient documentation

## 2013-10-30 DIAGNOSIS — E1110 Type 2 diabetes mellitus with ketoacidosis without coma: Secondary | ICD-10-CM

## 2013-10-30 DIAGNOSIS — K0889 Other specified disorders of teeth and supporting structures: Secondary | ICD-10-CM

## 2013-10-30 DIAGNOSIS — Z794 Long term (current) use of insulin: Secondary | ICD-10-CM | POA: Insufficient documentation

## 2013-10-30 DIAGNOSIS — F3130 Bipolar disorder, current episode depressed, mild or moderate severity, unspecified: Secondary | ICD-10-CM

## 2013-10-30 DIAGNOSIS — E860 Dehydration: Secondary | ICD-10-CM

## 2013-10-30 DIAGNOSIS — F329 Major depressive disorder, single episode, unspecified: Secondary | ICD-10-CM | POA: Insufficient documentation

## 2013-10-30 DIAGNOSIS — E119 Type 2 diabetes mellitus without complications: Secondary | ICD-10-CM | POA: Insufficient documentation

## 2013-10-30 DIAGNOSIS — T43224A Poisoning by selective serotonin reuptake inhibitors, undetermined, initial encounter: Secondary | ICD-10-CM | POA: Insufficient documentation

## 2013-10-30 DIAGNOSIS — F17200 Nicotine dependence, unspecified, uncomplicated: Secondary | ICD-10-CM | POA: Insufficient documentation

## 2013-10-30 DIAGNOSIS — T438X2A Poisoning by other psychotropic drugs, intentional self-harm, initial encounter: Secondary | ICD-10-CM

## 2013-10-30 HISTORY — DX: Type 2 diabetes mellitus without complications: E11.9

## 2013-10-30 HISTORY — DX: Depression, unspecified: F32.A

## 2013-10-30 HISTORY — DX: Essential (primary) hypertension: I10

## 2013-10-30 HISTORY — DX: Pure hypercholesterolemia, unspecified: E78.00

## 2013-10-30 HISTORY — DX: Major depressive disorder, single episode, unspecified: F32.9

## 2013-10-30 LAB — RAPID URINE DRUG SCREEN, HOSP PERFORMED
Amphetamines: NOT DETECTED
Barbiturates: NOT DETECTED
Benzodiazepines: NOT DETECTED
Cocaine: NOT DETECTED
Opiates: NOT DETECTED
TETRAHYDROCANNABINOL: POSITIVE — AB

## 2013-10-30 NOTE — ED Provider Notes (Signed)
CSN: [id number]     Arrival date & time 10/30/13  2245 History   First MD Initiated Contact with Patient 10/30/13 2330     Chief Complaint  Patient presents with  . Drug Overdose  . Medical Clearance     (Consider location/radiation/quality/duration/timing/severity/associated sxs/prior Treatment) HPI 33 year old male diabetic with history of depression broke up with his girlfriend within the last several weeks has been depressed since then today he is suicidal ideation admits overdose with Prozac; he did not tell anyone he overdosed the sister found the empty pill bottle and EMS was notified. The patient admits he is suicidal denies any threats or others denies homicidal ideation denies hallucinations denies other symptoms there is no treatment prior to arrival. Past Medical History  Diagnosis Date  . Diabetes   . Depression   . Hypertension   . Hypercholesteremia    History reviewed. No pertinent past surgical history. No family history on file. History  Substance Use Topics  . Smoking status: Current Every Day Smoker  . Smokeless tobacco: Not on file  . Alcohol Use: Yes    Review of Systems  10 Systems reviewed and are negative for acute change except as noted in the HPI.  Allergies  Penicillins  Home Medications   Current Outpatient Rx  Name  Route  Sig  Dispense  Refill  . acetaminophen (TYLENOL) 500 MG tablet   Oral   Take 1,000 mg by mouth every 6 (six) hours as needed for moderate pain.         Marland Kitchen FLUoxetine (PROZAC) 20 MG capsule   Oral   Take 1 capsule (20 mg total) by mouth daily. RESUME THIS AS YOU WERE TAKING AT HOME      3   . insulin NPH-regular Human (RELION 70/30) (70-30) 100 UNIT/ML injection   Subcutaneous   Inject 40-50 Units into the skin 2 (two) times daily with a meal.   10 mL   11     50 U in AM 30 min prior to breakfast 40 U in PM 3 ...   . lisinopril (PRINIVIL,ZESTRIL) 5 MG tablet   Oral   Take 1 tablet (5 mg total) by mouth  daily.   30 tablet   0   . lurasidone 20 MG TABS   Oral   Take 1 tablet (20 mg total) by mouth daily with breakfast.   30 tablet        RESUME AS YOU WERE TAKING AT HOME   . ibuprofen (ADVIL,MOTRIN) 200 MG tablet   Oral   Take 600 mg by mouth every 6 (six) hours as needed for moderate pain.          BP 147/78  Pulse 75  Temp(Src) 98.1 F (36.7 C) (Oral)  Resp 19  SpO2 98% Physical Exam  Nursing note and vitals reviewed. Constitutional:  Awake, alert, nontoxic appearance.  HENT:  Head: Atraumatic.  Eyes: Right eye exhibits no discharge. Left eye exhibits no discharge.  Neck: Neck supple.  Cardiovascular: Normal rate and regular rhythm.   No murmur heard. Pulmonary/Chest: Effort normal and breath sounds normal. No respiratory distress. He has no wheezes. He has no rales. He exhibits no tenderness.  Abdominal: Soft. There is no tenderness. There is no rebound.  Musculoskeletal: He exhibits no tenderness.  Baseline ROM, no obvious new focal weakness.  Neurological: He is alert.  Mental status and motor strength appears baseline for patient and situation.  Skin: No rash noted.  Psychiatric:  Depressed  with suicidal ideation; cooperative upon arrival; here voluntarily upon arrival    ED Course  Procedures (including critical care time) Patient understand and agree with initial ED impression and plan with expectations set for ED visit. Initially here voluntarily. Pt stable in ED with no significant deterioration in condition. TTS ordered; Dispo pending. 0555 Labs Review Labs Reviewed  URINE RAPID DRUG SCREEN (HOSP PERFORMED) - Abnormal; Notable for the following:    Tetrahydrocannabinol POSITIVE (*)    All other components within normal limits  CBC - Abnormal; Notable for the following:    HCT 38.2 (*)    MCHC 36.9 (*)    All other components within normal limits  ETHANOL - Abnormal; Notable for the following:    Alcohol, Ethyl (B) 17 (*)    All other components  within normal limits  SALICYLATE LEVEL - Abnormal; Notable for the following:    Salicylate Lvl <2.0 (*)    All other components within normal limits  CBG MONITORING, ED - Abnormal; Notable for the following:    Glucose-Capillary 127 (*)    All other components within normal limits  CBG MONITORING, ED - Abnormal; Notable for the following:    Glucose-Capillary 169 (*)    All other components within normal limits  ACETAMINOPHEN LEVEL  COMPREHENSIVE METABOLIC PANEL  CBG MONITORING, ED   Imaging Review No results found.   EKG Interpretation   Date/Time:  Tuesday October 31 2013 07:22:32 EDT Ventricular Rate:  64 PR Interval:  180 QRS Duration: 104 QT Interval:  429 QTC Calculation: 443 R Axis:   -34 Text Interpretation:  Sinus rhythm Left axis deviation Nonspecific T  abnormalities, inferior leads ST elev, probable normal early repol pattern  ED PHYSICIAN INTERPRETATION AVAILABLE IN [facility] Confirmed by  TEST, Record ([id number]) on 11/02/2013 11:03:43 AM      MDM   Final diagnoses:  Medication overdose    Dispo pending.    Hurman HornJohn M Isaiah Cianci, MD 11/03/13 [id number]

## 2013-10-30 NOTE — ED Notes (Signed)
Pt arrives by EMS ambulatory with overdose Prozac. Pt took Prozac 20 mg tabs x 10 tabs at 2145, and took Lipitor 5 mg x 4 tabs.  Pt admitted to EMS SI prior to taking meds, but is denying SI at this time.  Pt told EMS he is having some stressful issues with a male.  BP 134/98.  Pt does not have an IV.  HR 76 and regular, RR 16-Insulin dependent diabetic-CBG 117-did not take his Novolog today

## 2013-10-30 NOTE — ED Notes (Signed)
Poison Control called prior to pt's arrival-states medical clearance labs-12 lead EKG, watch for QTC prolongation, ventricular arrythmias, CNS depression-observe for 6 hours

## 2013-10-30 NOTE — ED Notes (Signed)
Bed: RESA Expected date:  Expected time:  Means of arrival:  Comments: Overdose Prozac 

## 2013-10-31 ENCOUNTER — Encounter (HOSPITAL_COMMUNITY): Payer: Self-pay | Admitting: Registered Nurse

## 2013-10-31 DIAGNOSIS — T50901A Poisoning by unspecified drugs, medicaments and biological substances, accidental (unintentional), initial encounter: Secondary | ICD-10-CM | POA: Diagnosis present

## 2013-10-31 LAB — CBC
HEMATOCRIT: 38.2 % — AB (ref 39.0–52.0)
Hemoglobin: 14.1 g/dL (ref 13.0–17.0)
MCH: 30.8 pg (ref 26.0–34.0)
MCHC: 36.9 g/dL — ABNORMAL HIGH (ref 30.0–36.0)
MCV: 83.4 fL (ref 78.0–100.0)
PLATELETS: 206 10*3/uL (ref 150–400)
RBC: 4.58 MIL/uL (ref 4.22–5.81)
RDW: 14.3 % (ref 11.5–15.5)
WBC: 8.2 10*3/uL (ref 4.0–10.5)

## 2013-10-31 LAB — COMPREHENSIVE METABOLIC PANEL
ALK PHOS: 44 U/L (ref 39–117)
ALT: 9 U/L (ref 0–53)
AST: 13 U/L (ref 0–37)
Albumin: 3.6 g/dL (ref 3.5–5.2)
BILIRUBIN TOTAL: 0.5 mg/dL (ref 0.3–1.2)
BUN: 8 mg/dL (ref 6–23)
CHLORIDE: 103 meq/L (ref 96–112)
CO2: 23 meq/L (ref 19–32)
CREATININE: 0.87 mg/dL (ref 0.50–1.35)
Calcium: 9 mg/dL (ref 8.4–10.5)
GFR calc Af Amer: >90 mL/min (ref >90)
Glucose, Bld: 99 mg/dL (ref 70–99)
POTASSIUM: 3.7 meq/L (ref 3.7–5.3)
Sodium: 140 mEq/L (ref 137–147)
Total Protein: 6.8 g/dL (ref 6.0–8.3)

## 2013-10-31 LAB — ETHANOL: Alcohol, Ethyl (B): 17 mg/dL — ABNORMAL HIGH (ref 0–11)

## 2013-10-31 LAB — SALICYLATE LEVEL: Salicylate Lvl: <2 mg/dL — ABNORMAL LOW (ref 2.8–20.0)

## 2013-10-31 LAB — CBG MONITORING, ED
GLUCOSE-CAPILLARY: 169 mg/dL — AB (ref 70–99)
Glucose-Capillary: 95 mg/dL (ref 70–99)
Glucose-Capillary: 127 mg/dL — ABNORMAL HIGH (ref 70–99)

## 2013-10-31 LAB — ACETAMINOPHEN LEVEL

## 2013-10-31 NOTE — ED Notes (Signed)
MD at bedside.Psych Team at the bedside

## 2013-10-31 NOTE — Discharge Instructions (Signed)
Bipolar Disorder °Bipolar disorder is a mental illness. The term bipolar disorder actually is used to describe a group of disorders that all share varying degrees of emotional highs and lows that can interfere with daily functioning, such as work, school, or relationships. Bipolar disorder also can lead to drug abuse, hospitalization, and suicide. °The emotional highs of bipolar disorder are periods of elation or irritability and high energy. These highs can range from a mild form (hypomania) to a severe form (mania). People experiencing episodes of hypomania may appear energetic, excitable, and highly productive. People experiencing mania may behave impulsively or erratically. They often make poor decisions. They may have difficulty sleeping. The most severe episodes of mania can involve having very distorted beliefs or perceptions about the world and seeing or hearing things that are not real (psychotic delusions and hallucinations).  °The emotional lows of bipolar disorder (depression) also can range from mild to severe. Severe episodes of bipolar depression can involve psychotic delusions and hallucinations. °Sometimes people with bipolar disorder experience a state of mixed mood. Symptoms of hypomania or mania and depression are both present during this mixed-mood episode. °SIGNS AND SYMPTOMS °There are signs and symptoms of the episodes of hypomania and mania as well as the episodes of depression. The signs and symptoms of hypomania and mania are similar but vary in severity. They include: °· Inflated self-esteem or feeling of increased self-confidence. °· Decreased need for sleep. °· Unusual talkativeness (rapid or pressured speech) or the feeling of a need to keep talking. °· Sensation of racing thoughts or constant talking, with quick shifts between topics that may or may not be related (flight of ideas). °· Decreased ability to focus or concentrate. °· Increased purposeful activity, such as work, studies,  or social activity, or nonproductive activity, such as pacing, squirming and fidgeting, or finger and toe tapping. °· Impulsive behavior and use of poor judgment, resulting in high-risk activities, such as having unprotected sex or spending excessive amounts of money. °Signs and symptoms of depression include the following:  °· Feelings of sadness, hopelessness, or helplessness. °· Frequent or uncontrollable episodes of crying. °· Lack of feeling anything or caring about anything. °· Difficulty sleeping or sleeping too much.  °· Inability to enjoy the things you used to enjoy.   °· Desire to be alone all the time.   °· Feelings of guilt or worthlessness.  °· Lack of energy or motivation.   °· Difficulty concentrating, remembering, or making decisions.  °· Change in appetite or weight beyond normal fluctuations. °· Thoughts of death or the desire to harm yourself. °DIAGNOSIS  °Bipolar disorder is diagnosed through an assessment by your caregiver. Your caregiver will ask questions about your emotional episodes. There are two main types of bipolar disorder. People with type I bipolar disorder have manic episodes with or without depressive episodes. People with type II bipolar disorder have hypomanic episodes and major depressive episodes, which are more serious than mild depression. The type of bipolar disorder you have can make an important difference in how your illness is monitored and treated. °Your caregiver may ask questions about your medical history and use of alcohol or drugs, including prescription medication. Certain medical conditions and substances also can cause emotional highs and lows that resemble bipolar disorder (secondary bipolar disorder).  °TREATMENT  °Bipolar disorder is a long-term illness. It is best controlled with continuous treatment rather than treatment only when symptoms occur. The following treatments can be prescribed for bipolar disorders: °· Medication Medication can be prescribed by  a doctor   that is an expert in treating mental disorders (psychiatrists). Medications called mood stabilizers are usually prescribed to help control the illness. Other medications are sometimes added if symptoms of mania, depression, or psychotic delusions and hallucinations occur despite the use of a mood stabilizer.  Talk therapy Some forms of talk therapy are helpful in providing support, education, and guidance. A combination of medication and talk therapy is best for managing the disorder over time. A procedure in which electricity is applied to your brain through your scalp (electroconvulsive therapy) is used in cases of severe mania when medication and talk therapy do not work or work too slowly. Document Released: 11/09/2000 Document Revised: 11/28/2012 Document Reviewed: 08/29/2012 [facility] Patient Information 2014 [place], [place].  Finding Treatment for Alcohol and Drug Addiction It can be hard to find the right place to get professional treatment. Here are some important things to consider:  There are different types of treatment to choose from.  Some programs are live-in (residential) while others are not (outpatient). Sometimes a combination is offered.  No single type of program is right for everyone.  Most treatment programs involve a combination of education, counseling, and a 12-step, spiritually-based approach.  There are non-spiritually based programs (not 12-step).  Some treatment programs are government sponsored. They are geared for patients without private insurance.  Treatment programs can vary in many respects such as:  Cost and types of insurance accepted.  Types of on-site medical services offered.  Length of stay, setting, and size.  Overall philosophy of treatment. A person may need specialized treatment or have needs not addressed by all programs. For example, adolescents need treatment appropriate for their age. Other people have secondary disorders that  must be managed as well. Secondary conditions can include mental illness, such as depression or diabetes. Often, a period of detoxification from alcohol or drugs is needed. This requires medical supervision and not all programs offer this. THINGS TO CONSIDER WHEN SELECTING A TREATMENT PROGRAM   Is the program certified by the appropriate government agency? Even private programs must be certified and employ certified professionals.  Does the program accept your insurance? If not, can a payment plan be set up?  Is the facility clean, organized, and well run? Do they allow you to speak with graduates who can share their treatment experience with you? Can you tour the facility? Can you meet with staff?  Does the program meet the full range of individual needs?  Does the treatment program address sexual orientation and physical disabilities? Do they provide age, gender, and culturally appropriate treatment services?  Is treatment available in languages other than English?  Is long-term aftercare support or guidance encouraged and provided?  Is assessment of an individual's treatment plan ongoing to ensure it meets changing needs?  Does the program use strategies to encourage reluctant patients to remain in treatment long enough to increase the likelihood of success?  Does the program offer counseling (individual or group) and other behavioral therapies?  Does the program offer medicine as part of the treatment regimen, if needed?  Is there ongoing monitoring of possible relapse? Is there a defined relapse prevention program? Are services or referrals offered to family members to ensure they understand addiction and the recovery process? This would help them support the recovering individual.  Are 12-step meetings held at the center or is transport available for patients to attend outside meetings? In countries outside of the [place]. and [place], Magazine features editor for contact information for  services in  your area. Document Released: 07/02/2005 Document Revised: 10/26/2011 Document Reviewed: 01/12/2008 [facility] Patient Information 2014 [place].

## 2013-10-31 NOTE — Consult Note (Signed)
[facility] Consult   Reason for Consult:  Medication overdose Referring Physician:  EDP  MARSELINO Mcneil is an 33 y.o. male. Total Time spent with patient: 45 minutes  Assessment: AXIS I:  Depressive Disorder NOS and Bipolar Disorder AXIS II:  Deferred AXIS III:   Past Medical History  Diagnosis Date  . Diabetes   . Depression   . Hypertension   . Hypercholesteremia    AXIS IV:  other psychosocial or environmental problems AXIS V:  61-70 mild symptoms  Plan:  No evidence of imminent risk to self or others at present.   Supportive therapy provided about ongoing stressors. Discussed crisis plan, support from social network, calling 911, coming to the Emergency Department, and calling Suicide Hotline.  Subjective:   Brandon Mcneil is a 33 y.o. male patient.  HPI:  Patient states "I broke up with my girlfriend yesterday.  I started drinking and I took to many of my Prozac; My mom just wanted me to come to the hospital to make sure I was okay.  I am feeling better now.  I don't want to kill myself.  I was just drunk."  Patient states that he goes to [facility] for outpatient services and has recently stop taking his Latuda related to causing problems with his diabetes.   Patient states that he is able to follow up with [facility]. Patient states that he does not have a history of prior suicide or inpatient treatment.  Patient denies suicidal/homicidal ideation, psychosis, and paranoia. HPI Elements:   Location:  Overdose of medication. Quality:  Intoxication. Severity:  took 10 prozac. Timing:  yesterday. Denies family history of mental illness Past Psychiatric History: Past Medical History  Diagnosis Date  . Diabetes   . Depression   . Hypertension   . Hypercholesteremia     reports that he has been smoking.  He does not have any smokeless tobacco history on file. He reports that he drinks alcohol. He reports that he does not use illicit drugs. No family  history on file.         Allergies:   Allergies  Allergen Reactions  . Penicillins Itching and Rash    ACT Assessment Complete:  No:   Past Psychiatric History: Diagnosis:  Bipolar disorder, Depressive disorder  Hospitalizations:  Denies  Outpatient Care:  [facility]  Substance Abuse Care:  [facility]  Self-Mutilation:  Denies  Suicidal Attempts:  Denies although took 10 Prozac while intoxicated  Homicidal Behaviors:  Denies   Violent Behaviors:  Denies   Place of Residence:  [place] Marital Status:  Single Employed/Unemployed:  Unemployed Education:   Family Supports:  Mother Objective: Blood pressure 127/73, pulse 80, temperature 98 F (36.7 C), temperature source Oral, resp. rate 19, SpO2 99.00%.There is no weight on file to calculate BMI. Results for orders placed during the hospital encounter of 10/30/13 (from the past 72 hour(s))  CBG MONITORING, ED     Status: None   Collection Time    10/30/13 10:44 PM      Result Value Ref Range   Glucose-Capillary 95  70 - 99 mg/dL  URINE RAPID DRUG SCREEN (HOSP PERFORMED)     Status: Abnormal   Collection Time    10/30/13 11:39 PM      Result Value Ref Range   Opiates NONE DETECTED  NONE DETECTED   Cocaine NONE DETECTED  NONE DETECTED   Benzodiazepines NONE DETECTED  NONE DETECTED   Amphetamines NONE DETECTED  NONE DETECTED  Tetrahydrocannabinol POSITIVE (*) NONE DETECTED   Barbiturates NONE DETECTED  NONE DETECTED   Comment:            DRUG SCREEN FOR MEDICAL PURPOSES     ONLY.  IF CONFIRMATION IS NEEDED     FOR ANY PURPOSE, NOTIFY LAB     WITHIN 5 DAYS.                LOWEST DETECTABLE LIMITS     FOR URINE DRUG SCREEN     Drug Class       Cutoff (ng/mL)     Amphetamine      1000     Barbiturate      200     Benzodiazepine   347     Tricyclics       425     Opiates          300     Cocaine          300     THC              50  ACETAMINOPHEN LEVEL     Status: None   Collection Time    10/31/13  2:50 AM      Result  Value Ref Range   Acetaminophen (Tylenol), Serum <15.0  10 - 30 ug/mL   Comment:            THERAPEUTIC CONCENTRATIONS VARY     SIGNIFICANTLY. A RANGE OF 10-30     ug/mL MAY BE AN EFFECTIVE     CONCENTRATION FOR MANY PATIENTS.     HOWEVER, SOME ARE BEST TREATED     AT CONCENTRATIONS OUTSIDE THIS     RANGE.     ACETAMINOPHEN CONCENTRATIONS     >150 ug/mL AT 4 HOURS AFTER     INGESTION AND >50 ug/mL AT 12     HOURS AFTER INGESTION ARE     OFTEN ASSOCIATED WITH TOXIC     REACTIONS.  CBC     Status: Abnormal   Collection Time    10/31/13  2:50 AM      Result Value Ref Range   WBC 8.2  4.0 - 10.5 K/uL   RBC 4.58  4.22 - 5.81 MIL/uL   Hemoglobin 14.1  13.0 - 17.0 g/dL   HCT 38.2 (*) 39.0 - 52.0 %   MCV 83.4  78.0 - 100.0 fL   MCH 30.8  26.0 - 34.0 pg   MCHC 36.9 (*) 30.0 - 36.0 g/dL   RDW 14.3  11.5 - 15.5 %   Platelets 206  150 - 400 K/uL  COMPREHENSIVE METABOLIC PANEL     Status: None   Collection Time    10/31/13  2:50 AM      Result Value Ref Range   Sodium 140  137 - 147 mEq/L   Potassium 3.7  3.7 - 5.3 mEq/L   Chloride 103  96 - 112 mEq/L   CO2 23  19 - 32 mEq/L   Glucose, Bld 99  70 - 99 mg/dL   BUN 8  6 - 23 mg/dL   Creatinine, Ser 0.87  0.50 - 1.35 mg/dL   Calcium 9.0  8.4 - 10.5 mg/dL   Total Protein 6.8  6.0 - 8.3 g/dL   Albumin 3.6  3.5 - 5.2 g/dL   AST 13  0 - 37 U/L   ALT 9  0 - 53 U/L   Alkaline Phosphatase 44  39 -  117 U/L   Total Bilirubin 0.5  0.3 - 1.2 mg/dL   GFR calc non Af Amer >90  >90 mL/min   GFR calc Af Amer >90  >90 mL/min   Comment: (NOTE)     The eGFR has been calculated using the CKD EPI equation.     This calculation has not been validated in all clinical situations.     eGFR's persistently <90 mL/min signify possible Chronic Kidney     Disease.  ETHANOL     Status: Abnormal   Collection Time    10/31/13  2:50 AM      Result Value Ref Range   Alcohol, Ethyl (B) 17 (*) 0 - 11 mg/dL   Comment:            LOWEST DETECTABLE LIMIT FOR      SERUM ALCOHOL IS 11 mg/dL     FOR MEDICAL PURPOSES ONLY  SALICYLATE LEVEL     Status: Abnormal   Collection Time    10/31/13  2:50 AM      Result Value Ref Range   Salicylate Lvl <3.7 (*) 2.8 - 20.0 mg/dL  CBG MONITORING, ED     Status: Abnormal   Collection Time    10/31/13 10:26 AM      Result Value Ref Range   Glucose-Capillary 127 (*) 70 - 99 mg/dL   Labs are reviewed and are pertinent for ETOH 17, .  No current facility-administered medications for this encounter.   Current Outpatient Prescriptions  Medication Sig Dispense Refill  . acetaminophen (TYLENOL) 500 MG tablet Take 1,000 mg by mouth every 6 (six) hours as needed for moderate pain.      Marland Kitchen FLUoxetine (PROZAC) 20 MG capsule Take 1 capsule (20 mg total) by mouth daily. RESUME THIS AS YOU WERE TAKING AT HOME    3  . insulin NPH-regular Human (RELION 70/30) (70-30) 100 UNIT/ML injection Inject 40-50 Units into the skin 2 (two) times daily with a meal.  10 mL  11  . lisinopril (PRINIVIL,ZESTRIL) 5 MG tablet Take 1 tablet (5 mg total) by mouth daily.  30 tablet  0  . lurasidone 20 MG TABS Take 1 tablet (20 mg total) by mouth daily with breakfast.  30 tablet    . ibuprofen (ADVIL,MOTRIN) 200 MG tablet Take 600 mg by mouth every 6 (six) hours as needed for moderate pain.        Psychiatric Specialty Exam:     Blood pressure 127/73, pulse 80, temperature 98 F (36.7 C), temperature source Oral, resp. rate 19, SpO2 99.00%.There is no weight on file to calculate BMI.  General Appearance: Casual  Eye Contact::  Good  Speech:  Clear and Coherent and Normal Rate  Volume:  Normal  Mood:  "I feel good"  Affect:  Congruent  Thought Process:  Circumstantial and Goal Directed  Orientation:  Full (Time, Place, and Person)  Thought Content:  "Just need to get medication regulated"  Suicidal Thoughts:  No  Homicidal Thoughts:  No  Memory:  Immediate;   Good Recent;   Good Remote;   Good  Judgement:  Fair  Insight:  Fair   Psychomotor Activity:  Normal  Concentration:  Fair  Recall:  Good  Fund of Knowledge:Good  Language: Good  Akathisia:  No  Handed:  Right  AIMS (if indicated):     Assets:  Communication Skills Desire for Improvement Housing  Sleep:      Musculoskeletal: Strength & Muscle Tone: within normal  limits Gait & Station: normal Patient leans: N/A  Treatment Plan Summary: Outpatient provider  Disposition:  Discharge home.  Patient [facility]  Discharge Assessment     Demographic Factors:  Male  Total Time spent with patient: 15 minutes  Psychiatric Specialty Exam: Same as above  Musculoskeletal: Same as above  Mental Status Per Nursing Assessment::   On Admission:     Current Mental Status by Physician: Patient denies suicidal/homicidal ideation, psychosis, and paranoia  Loss Factors: NA  Historical Factors: NA  Risk Reduction Factors:   Living with another person, especially a relative and Positive social support  Continued Clinical Symptoms:  Bipolar Disorder; and Depressive Disorder  Cognitive Features That Contribute To Risk:  None noted    Suicide Risk:  Minimal: No identifiable suicidal ideation.  Patients presenting with no risk factors but with morbid ruminations; may be classified as minimal risk based on the severity of the depressive symptoms  Discharge Diagnoses:  Same as above  Plan Of Care/Follow-up recommendations:  Activity:  Resume usual activity Diet:  Resume usual diet  Is patient on multiple antipsychotic therapies at discharge:  No   Has Patient had three or more failed trials of antipsychotic monotherapy by history:  No  Recommended Plan for Multiple Antipsychotic Therapies: NA  Pieper Kasik, FNP-BC 10/31/2013 12:31 PM

## 2013-10-31 NOTE — Progress Notes (Signed)
Per discussion with psychiatrist and NP, patient psychiatrically stable for discharge home. Patient is followed by [facility]. CSW provided mobile crisis information and family services. Pt mother to provide transportation home.   Byrd HesselbachKristen Wilhemina Grall, LCSW [phone]  ED CSW 10/31/2013 1310pm

## 2013-10-31 NOTE — ED Notes (Signed)
PC updated on pt and states he is clear to be discharged from them if pt denies SI/HI

## 2013-10-31 NOTE — Progress Notes (Signed)
[facility] provided pt with a [employer] Card application, [employer] [facility]. Patient stated that he was pending insurance.

## 2013-10-31 NOTE — Consult Note (Signed)
Face to face evaluation and I agree with this note 

## 2014-01-31 ENCOUNTER — Encounter (HOSPITAL_COMMUNITY): Payer: Self-pay | Admitting: Emergency Medicine

## 2014-01-31 ENCOUNTER — Emergency Department (INDEPENDENT_AMBULATORY_CARE_PROVIDER_SITE_OTHER)
Admission: EM | Admit: 2014-01-31 | Discharge: 2014-01-31 | Disposition: A | Discharge status: Home / Self Care | Payer: Self-pay | Source: Home / Self Care | Attending: Family Medicine | Admitting: Family Medicine

## 2014-01-31 DIAGNOSIS — E119 Type 2 diabetes mellitus without complications: Secondary | ICD-10-CM

## 2014-01-31 DIAGNOSIS — I10 Essential (primary) hypertension: Secondary | ICD-10-CM

## 2014-01-31 LAB — GLUCOSE, CAPILLARY: GLUCOSE-CAPILLARY: 104 mg/dL — AB (ref 70–99)

## 2014-01-31 MED ORDER — GLUCOSE BLOOD VI STRP: ORAL_STRIP | Status: DC

## 2014-01-31 MED ORDER — LISINOPRIL 5 MG PO TABS: 5.0000 mg | ORAL_TABLET | Freq: Every day | ORAL | Status: DC

## 2014-01-31 MED ORDER — CEPHALEXIN 500 MG PO CAPS: 500.0000 mg | ORAL_CAPSULE | Freq: Four times a day (QID) | ORAL | Status: DC

## 2014-01-31 MED ORDER — INSULIN NPH ISOPHANE & REGULAR (70-30) 100 UNIT/ML ~~LOC~~ SUSP: 40.0000 [IU] | Freq: Two times a day (BID) | SUBCUTANEOUS | Status: DC

## 2014-01-31 NOTE — Discharge Instructions (Signed)
Arterial Hypertension °Arterial hypertension (high blood pressure) is a condition of elevated pressure in your blood vessels. Hypertension over a long period of time is a risk factor for strokes, heart attacks, and heart failure. It is also the leading cause of kidney (renal) failure.  °CAUSES  °· In Adults -- Over 90% of all hypertension has no known cause. This is called essential or primary hypertension. In the other 10% of people with hypertension, the increase in blood pressure is caused by another disorder. This is called secondary hypertension. Important causes of secondary hypertension are: °· Heavy alcohol use. °· Obstructive sleep apnea. °· Hyperaldosterosim (Conn's syndrome). °· Steroid use. °· Chronic kidney failure. °· Hyperparathyroidism. °· Medications. °· Renal artery stenosis. °· Pheochromocytoma. °· Cushing's disease. °· Coarctation of the aorta. °· Scleroderma renal crisis. °· Licorice (in excessive amounts). °· Drugs (cocaine, methamphetamine). °Your caregiver can explain any items above that apply to you. °· In Children -- Secondary hypertension is more common and should always be considered. °· Pregnancy -- Few women of childbearing age have high blood pressure. However, up to 10% of them develop hypertension of pregnancy. Generally, this will not harm the woman. It may be a sign of 3 complications of pregnancy: preeclampsia, HELLP syndrome, and eclampsia. Follow up and control with medication is necessary. °SYMPTOMS  °· This condition normally does not produce any noticeable symptoms. It is usually found during a routine exam. °· Malignant hypertension is a late problem of high blood pressure. It may have the following symptoms: °· Headaches. °· Blurred vision. °· End-organ damage (this means your kidneys, heart, lungs, and other organs are being damaged). °· Stressful situations can increase the blood pressure. If a person with normal blood pressure has their blood pressure go up while being  seen by their caregiver, this is often termed "white coat hypertension." Its importance is not known. It may be related with eventually developing hypertension or complications of hypertension. °· Hypertension is often confused with mental tension, stress, and anxiety. °DIAGNOSIS  °The diagnosis is made by 3 separate blood pressure measurements. They are taken at least 1 week apart from each other. If there is organ damage from hypertension, the diagnosis may be made without repeat measurements. °Hypertension is usually identified by having blood pressure readings: °· Above 140/90 mmHg measured in both arms, at 3 separate times, over a couple weeks. °· Over 130/80 mmHg should be considered a risk factor and may require treatment in patients with diabetes. °Blood pressure readings over 120/80 mmHg are called "pre-hypertension" even in non-diabetic patients. °To get a true blood pressure measurement, use the following guidelines. Be aware of the factors that can alter blood pressure readings. °· Take measurements at least 1 hour after caffeine. °· Take measurements 30 minutes after smoking and without any stress. This is another reason to quit smoking  it raises your blood pressure. °· Use a proper cuff size. Ask your caregiver if you are not sure about your cuff size. °· Most home blood pressure cuffs are automatic. They will measure systolic and diastolic pressures. The systolic pressure is the pressure reading at the start of sounds. Diastolic pressure is the pressure at which the sounds disappear. If you are elderly, measure pressures in multiple postures. Try sitting, lying or standing. °· Sit at rest for a minimum of 5 minutes before taking measurements. °· You should not be on any medications like decongestants. These are found in many cold medications. °· Record your blood pressure readings and review   them with your caregiver. °If you have hypertension: °· Your caregiver may do tests to be sure you do not have  secondary hypertension (see "causes" above). °· Your caregiver may also look for signs of metabolic syndrome. This is also called Syndrome X or Insulin Resistance Syndrome. You may have this syndrome if you have type 2 diabetes, abdominal obesity, and abnormal blood lipids in addition to hypertension. °· Your caregiver will take your medical and family history and perform a physical exam. °· Diagnostic tests may include blood tests (for glucose, cholesterol, potassium, and kidney function), a urinalysis, or an EKG. Other tests may also be necessary depending on your condition. °PREVENTION  °There are important lifestyle issues that you can adopt to reduce your chance of developing hypertension: °· Maintain a normal weight. °· Limit the amount of salt (sodium) in your diet. °· Exercise often. °· Limit alcohol intake. °· Get enough potassium in your diet. Discuss specific advice with your caregiver. °· Follow a DASH diet (dietary approaches to stop hypertension). This diet is rich in fruits, vegetables, and low-fat dairy products, and avoids certain fats. °PROGNOSIS  °Essential hypertension cannot be cured. Lifestyle changes and medical treatment can lower blood pressure and reduce complications. The prognosis of secondary hypertension depends on the underlying cause. Many people whose hypertension is controlled with medicine or lifestyle changes can live a normal, healthy life.  °RISKS AND COMPLICATIONS  °While high blood pressure alone is not an illness, it often requires treatment due to its short- and long-term effects on many organs. Hypertension increases your risk for: °· CVAs or strokes (cerebrovascular accident). °· Heart failure due to chronically high blood pressure (hypertensive cardiomyopathy). °· Heart attack (myocardial infarction). °· Damage to the retina (hypertensive retinopathy). °· Kidney failure (hypertensive nephropathy). °Your caregiver can explain list items above that apply to you. Treatment  of hypertension can significantly reduce the risk of complications. °TREATMENT  °· For overweight patients, weight loss and regular exercise are recommended. Physical fitness lowers blood pressure. °· Mild hypertension is usually treated with diet and exercise. A diet rich in fruits and vegetables, fat-free dairy products, and foods low in fat and salt (sodium) can help lower blood pressure. Decreasing salt intake decreases blood pressure in a 1/3 of people. °· Stop smoking if you are a smoker. °The steps above are highly effective in reducing blood pressure. While these actions are easy to suggest, they are difficult to achieve. Most patients with moderate or severe hypertension end up requiring medications to bring their blood pressure down to a normal level. There are several classes of medications for treatment. Blood pressure pills (antihypertensives) will lower blood pressure by their different actions. Lowering the blood pressure by 10 mmHg may decrease the risk of complications by as much as 25%. °The goal of treatment is effective blood pressure control. This will reduce your risk for complications. Your caregiver will help you determine the best treatment for you according to your lifestyle. What is excellent treatment for one person, may not be for you. °HOME CARE INSTRUCTIONS  °· Do not smoke. °· Follow the lifestyle changes outlined in the "Prevention" section. °· If you are on medications, follow the directions carefully. Blood pressure medications must be taken as prescribed. Skipping doses reduces their benefit. It also puts you at risk for problems. °· Follow up with your caregiver, as directed. °· If you are asked to monitor your blood pressure at home, follow the guidelines in the "Diagnosis" section above. °SEEK MEDICAL CARE   IF:   You think you are having medication side effects.  You have recurrent headaches or lightheadedness.  You have swelling in your ankles.  You have trouble with  your vision. SEEK IMMEDIATE MEDICAL CARE IF:   You have sudden onset of chest pain or pressure, difficulty breathing, or other symptoms of a heart attack.  You have a severe headache.  You have symptoms of a stroke (such as sudden weakness, difficulty speaking, difficulty walking). MAKE SURE YOU:   Understand these instructions.  Will watch your condition.  Will get help right away if you are not doing well or get worse. Document Released: 08/03/2005 Document Revised: 10/26/2011 Document Reviewed: 03/03/2007 [facility] Patient Information 2014 [place], [place].  Blood Glucose Monitoring Monitoring your blood glucose (also know as blood sugar) helps you to manage your diabetes. It also helps you and your health care provider monitor your diabetes and determine how well your treatment plan is working. WHY SHOULD YOU MONITOR YOUR BLOOD GLUCOSE?  It can help you understand how food, exercise, and medicine affect your blood glucose.  It allows you to know what your blood glucose is at any given moment. You can quickly tell if you are having low blood glucose (hypoglycemia) or high blood glucose (hyperglycemia).  It can help you and your health care provider know how to adjust your medicines.  It can help you understand how to manage an illness or adjust medicine for exercise. WHEN SHOULD YOU TEST? Your health care provider will help you decide how often you should check your blood glucose. This may depend on the type of diabetes you have, your diabetes control, or the types of medicines you are taking. Be sure to write down all of your blood glucose readings so that this information can be reviewed with your health care provider. See below for examples of testing times that your health care provider may suggest. Type 1 Diabetes  Test 4 times a day if you are in good control, using an insulin pump, or perform multiple daily injections.  If your diabetes is not well-controlled or if you are  sick, you may need to monitor more often.  It is a good idea to also monitor:  Before and after exercise.  Between meals and 2 hours after a meal.  Occasionally between 2:00 and 3:00 a.m. Type 2 Diabetes  It can vary with each person, but generally, if you are on insulin, test 4 times a day.  If you take medicines by mouth (orally), test 2 times a day.  If you are on a controlled diet, test once a day.  If your diabetes is not well controlled or if you are sick, you may need to monitor more often. HOW TO MONITOR YOUR BLOOD GLUCOSE Supplies Needed  Blood glucose meter.  Test strips for your meter. Each meter has its own strips. You must use the strips that go with your own meter.  A pricking needle (lancet).  A device that holds the lancet (lancing device).  A journal or log book to write down your results. Procedure  Wash your hands with soap and water. Alcohol is not preferred.  Prick the side of your finger (not the tip) with the lancet.  Gently milk the finger until a small drop of blood appears.  Follow the instructions that come with your meter for inserting the test strip, applying blood to the strip, and using your blood glucose meter. Other Areas to Get Blood for Testing Some meters allow  you to use other areas of your body (other than your finger) to test your blood. These areas are called alternative sites. The most common alternative sites are:  The forearm.  The thigh.  The back area of the lower leg.  The palm of the hand. The blood flow in these areas is slower. Therefore, the blood glucose values you get may be delayed, and the numbers are different from what you would get from your fingers. Do not use alternative sites if you think you are having hypoglycemia. Your reading will not be accurate. Always use a finger if you are having hypoglycemia. Also, if you cannot feel your lows (hypoglycemia unawareness), always use your fingers for your blood  glucose checks. ADDITIONAL TIPS FOR GLUCOSE MONITORING  Do not reuse lancets.  Always carry your supplies with you.  All blood glucose meters have a 24-hour "hotline" number to call if you have questions or need help.  Adjust (calibrate) your blood glucose meter with a control solution after finishing a few boxes of strips. BLOOD GLUCOSE RECORD KEEPING It is a good idea to keep a daily record or log of your blood glucose readings. Most glucose meters, if not all, keep your glucose records stored in the meter. Some meters come with the ability to download your records to your home computer. Keeping a record of your blood glucose readings is especially helpful if you are wanting to look for patterns. Make notes to go along with the blood glucose readings because you might forget what happened at that exact time. Keeping good records helps you and your health care provider to work together to achieve good diabetes management.  Document Released: 08/06/2003 Document Revised: 08/08/2013 Document Reviewed: 12/26/2012 [facility] Patient Information 2015 [place], [place]. This information is not intended to replace advice given to you by your health care provider. Make sure you discuss any questions you have with your health care provider.

## 2014-01-31 NOTE — ED Notes (Signed)
I-stat chem8 was performed but reported anomalous TCO2, Cl, BUN, and Anion gap readings (starred values).  This was reported to the [facility] and the test was reordered. A second green tube was drawn along with a lavender tube (A1C).  The I-stat test reported similar values with the same anomalous readings. The I-stat unit was calibrated against the electronic simulator and passed. A CBG was run, obtaining a value that was similar to the I-stat values.  Results were reported to the [facility], Brandon MaskerKaren Sofia, PA-C.

## 2014-01-31 NOTE — ED Notes (Signed)
Pt. Goes to the [facility], trying to get Medicaid.

## 2014-01-31 NOTE — ED Notes (Signed)
Pt. asked for test strips for his Relion glucose machine and something for his gum abscess.  Discussed with Langston MaskerKaren Sofia PA.  She did Rx.'s for the strips and Keflex.  Given to pt. He has a Education officer, communitydentist to f/u.  PA wants CBG checked due to I-stat 8 discrepancies.

## 2014-01-31 NOTE — ED Provider Notes (Signed)
Medical screening examination/treatment/procedure(s) were performed by a resident physician or non-physician practitioner and as the supervising physician I was immediately available for consultation/collaboration.  Evan Corey, MD    Evan S Corey, MD 01/31/14 2009 

## 2014-01-31 NOTE — ED Provider Notes (Signed)
CSN: [id number]     Arrival date & time 01/31/14  1616 History   First MD Initiated Contact with Patient 01/31/14 1815     Chief Complaint  Patient presents with  . Medication Refill   (Consider location/radiation/quality/duration/timing/severity/associated sxs/prior Treatment) Patient is a 33 y.o. male presenting with hyperglycemia. The history is provided by the patient. No language interpreter was used.  Hyperglycemia Blood sugar level PTA:  Out of medication Severity:  Moderate Onset quality:  Sudden Timing:  Constant Progression:  Worsening Chronicity:  New Diabetes status:  Controlled with insulin Current diabetic therapy:  Out of insulin Relieved by:  Nothing Ineffective treatments:  None tried Pt also needs blood pressure medications  Past Medical History  Diagnosis Date  . Diabetes   . Depression   . Hypertension   . Hypercholesteremia    History reviewed. No pertinent past surgical history. History reviewed. No pertinent family history. History  Substance Use Topics  . Smoking status: Current Every Day Smoker -- 0.50 packs/day    Types: Cigarettes  . Smokeless tobacco: Not on file  . Alcohol Use: Yes     Comment: occasional    Review of Systems  All other systems reviewed and are negative.   Allergies  Penicillins  Home Medications   Prior to Admission medications   Medication Sig Start Date End Date Taking? Authorizing Provider  ibuprofen (ADVIL,MOTRIN) 200 MG tablet Take 600 mg by mouth every 6 (six) hours as needed for moderate pain.   Yes Historical Provider, MD  lisinopril (PRINIVIL,ZESTRIL) 5 MG tablet Take 1 tablet (5 mg total) by mouth daily. 09/15/13  Yes Brandon DarAllison L Ellis, NP  acetaminophen (TYLENOL) 500 MG tablet Take 1,000 mg by mouth every 6 (six) hours as needed for moderate pain.    Historical Provider, MD  FLUoxetine (PROZAC) 20 MG capsule Take 1 capsule (20 mg total) by mouth daily. RESUME THIS AS YOU WERE TAKING AT HOME 09/14/13   Brandon DarAllison  L Ellis, NP  insulin NPH-regular Human (RELION 70/30) (70-30) 100 UNIT/ML injection Inject 40-50 Units into the skin 2 (two) times daily with a meal. 09/15/13   Brandon CantorSaima Rizwan, MD  lurasidone 20 MG TABS Take 1 tablet (20 mg total) by mouth daily with breakfast. 09/14/13   Brandon DarAllison L Ellis, NP   BP 186/98  Pulse 67  Temp(Src) 99.2 F (37.3 C) (Oral)  Resp 19  SpO2 100% Physical Exam  Nursing note and vitals reviewed. Constitutional: He is oriented to person, place, and time. He appears well-developed and well-nourished.  HENT:  Head: Normocephalic.  Eyes: EOM are normal.  Neck: Normal range of motion.  Pulmonary/Chest: Effort normal.  Abdominal: Soft. He exhibits no distension.  Musculoskeletal: Normal range of motion.  Neurological: He is alert and oriented to person, place, and time.  Psychiatric: He has a normal mood and affect.    ED Course  Procedures (including critical care time) Labs Review Labs Reviewed - No data to display  Imaging Review No results found.   MDM   1. Diabetes   2. HTN (hypertension)    rx for insulin and lisinopril    [facility]  IStat 8 results incomplete/  Will cancel istat and do cbg.   Cbg  103.   I will not restick pt for Istat.   A1c ordered  Elson AreasLeslie K Sofia, PA-C 01/31/14 1905  Lonia SkinnerLeslie K MullensSofia, PA-C 01/31/14 1949

## 2014-01-31 NOTE — ED Notes (Addendum)
Ran out of diabetes medicine in April.  Has been using ex-girlfriends mother and has been using her Novolin and Novolog. LD yesterday  He noted it was expired in 2013.  Has not been checking his sugar since 5/1 and it was 113.  States he ran out of his BP medication on Friday.  No increased thirst or blurred vision.  C/o shakes this afternoon and ate a Mal[place]key sandwich with relief.  Also c/o abscess on his gum in R upper jaw.  Tooth broke off last Nov.

## 2014-02-01 LAB — HEMOGLOBIN A1C
Hgb A1c MFr Bld: 5.5 % (ref <5.7)
Mean Plasma Glucose: 111 mg/dL (ref <117)

## 2014-08-03 ENCOUNTER — Encounter (HOSPITAL_COMMUNITY): Payer: Self-pay | Admitting: *Deleted

## 2014-08-03 ENCOUNTER — Emergency Department (HOSPITAL_COMMUNITY)
Admission: EM | Admit: 2014-08-03 | Discharge: 2014-08-03 | Disposition: A | Discharge status: Home / Self Care | Payer: Self-pay | Attending: Emergency Medicine | Admitting: Emergency Medicine

## 2014-08-03 DIAGNOSIS — F329 Major depressive disorder, single episode, unspecified: Secondary | ICD-10-CM | POA: Insufficient documentation

## 2014-08-03 DIAGNOSIS — I10 Essential (primary) hypertension: Secondary | ICD-10-CM | POA: Insufficient documentation

## 2014-08-03 DIAGNOSIS — Z792 Long term (current) use of antibiotics: Secondary | ICD-10-CM | POA: Insufficient documentation

## 2014-08-03 DIAGNOSIS — Z79899 Other long term (current) drug therapy: Secondary | ICD-10-CM | POA: Insufficient documentation

## 2014-08-03 DIAGNOSIS — Z720 Tobacco use: Secondary | ICD-10-CM | POA: Insufficient documentation

## 2014-08-03 DIAGNOSIS — Z880 Allergy status to penicillin: Secondary | ICD-10-CM | POA: Insufficient documentation

## 2014-08-03 DIAGNOSIS — E119 Type 2 diabetes mellitus without complications: Secondary | ICD-10-CM | POA: Insufficient documentation

## 2014-08-03 DIAGNOSIS — Z794 Long term (current) use of insulin: Secondary | ICD-10-CM | POA: Insufficient documentation

## 2014-08-03 DIAGNOSIS — K047 Periapical abscess without sinus: Secondary | ICD-10-CM | POA: Insufficient documentation

## 2014-08-03 MED ORDER — HYDROCODONE-ACETAMINOPHEN 5-325 MG PO TABS: 1.0000 | ORAL_TABLET | Freq: Four times a day (QID) | ORAL | Status: DC | PRN

## 2014-08-03 MED ORDER — LIDOCAINE HCL (PF) 1 % IJ SOLN
5.0000 mL | Freq: Once | INTRAMUSCULAR | Status: AC
  Administered 2014-08-03: 5 mL
  Filled 2014-08-03: qty 5

## 2014-08-03 MED ORDER — CLINDAMYCIN HCL 150 MG PO CAPS: 450.0000 mg | ORAL_CAPSULE | Freq: Three times a day (TID) | ORAL | Status: DC

## 2014-08-03 MED ORDER — HYDROCODONE-ACETAMINOPHEN 5-325 MG PO TABS
2.0000 | ORAL_TABLET | Freq: Once | ORAL | Status: AC
  Administered 2014-08-03: 2 via ORAL
  Filled 2014-08-03: qty 2

## 2014-08-03 NOTE — Discharge Instructions (Signed)
Dental Abscess °A dental abscess is a collection of infected fluid (pus) from a bacterial infection in the inner part of the tooth (pulp). It usually occurs at the end of the tooth's root.  °CAUSES  °· Severe tooth decay. °· Trauma to the tooth that allows bacteria to enter into the pulp, such as a broken or chipped tooth. °SYMPTOMS  °· Severe pain in and around the infected tooth. °· Swelling and redness around the abscessed tooth or in the mouth or face. °· Tenderness. °· Pus drainage. °· Bad breath. °· Bitter taste in the mouth. °· Difficulty swallowing. °· Difficulty opening the mouth. °· Nausea. °· Vomiting. °· Chills. °· Swollen neck glands. °DIAGNOSIS  °· A medical and dental history will be taken. °· An examination will be performed by tapping on the abscessed tooth. °· X-rays may be taken of the tooth to identify the abscess. °TREATMENT °The goal of treatment is to eliminate the infection. You may be prescribed antibiotic medicine to stop the infection from spreading. A root canal may be performed to save the tooth. If the tooth cannot be saved, it may be pulled (extracted) and the abscess may be drained.  °HOME CARE INSTRUCTIONS °· Only take over-the-counter or prescription medicines for pain, fever, or discomfort as directed by your caregiver. °· Rinse your mouth (gargle) often with salt water (¼ tsp salt in 8 oz [250 ml] of warm water) to relieve pain or swelling. °· Do not drive after taking pain medicine (narcotics). °· Do not apply heat to the outside of your face. °· Return to your dentist for further treatment as directed. °SEEK MEDICAL CARE IF: °· Your pain is not helped by medicine. °· Your pain is getting worse instead of better. °SEEK IMMEDIATE MEDICAL CARE IF: °· You have a fever or persistent symptoms for more than 2-3 days. °· You have a fever and your symptoms suddenly get worse. °· You have chills or a very bad headache. °· You have problems breathing or swallowing. °· You have trouble  opening your mouth. °· You have swelling in the neck or around the eye. °Document Released: 08/03/2005 Document Revised: 04/27/2012 Document Reviewed: 11/11/2010 °ExitCare® Patient Information ©2015 [employer]. This information is not intended to replace advice given to you by your health care provider. Make sure you discuss any questions you have with your health care provider. ° ° °Emergency Department Resource Guide °1) Find a Doctor and Pay Out of Pocket °Although you won't have to find out who is covered by your insurance plan, it is a good idea to ask around and get recommendations. You will then need to call the office and see if the doctor you have chosen will accept you as a new patient and what types of options they offer for patients who are self-pay. Some doctors offer discounts or will set up payment plans for their patients who do not have insurance, but you will need to ask so you aren't surprised when you get to your appointment. ° °2) Contact Your Local Health Department °Not all health departments have doctors that can see patients for sick visits, but many do, so it is worth a call to see if yours does. If you don't know where your local health department is, you can check in your phone book. The CDC also has a tool to help you locate your state's health department, and many state websites also have listings of all of their local health departments. ° °3) Find a Walk-in   Clinic °If your illness is not likely to be very severe or complicated, you may want to try a walk in clinic. These are popping up all over the country in pharmacies, drugstores, and shopping centers. They're usually staffed by nurse practitioners or physician assistants that have been trained to treat common illnesses and complaints. They're usually fairly quick and inexpensive. However, if you have serious medical issues or chronic medical problems, these are probably not your best option. ° °No Primary Care Doctor: °- Call  [facility] at  832-8000 - they can help you locate a primary care doctor that  accepts your insurance, provides certain services, etc. °- Physician Referral Service- 1-800-533-3463 ° °Chronic Pain Problems: °Organization         Address  Phone   Notes  °[facility]  (336) 297-2271 Patients need to be referred by their primary care doctor.  ° °Medication Assistance: °Organization         Address  Phone   Notes  °[place] [facility] [address] °[place], [place] [address] (336) 641-8030 --Must be a resident of [place] °-- Must have NO insurance coverage whatsoever (no Medicaid/ Medicare, etc.) °-- The pt. MUST have a primary care doctor that directs their care regularly and follows them in the community °  °[facility]  (866) 331-1348   °[employer]  (888) 892-1162   ° °Agencies that provide inexpensive medical care: °Organization         Address  Phone   Notes  °[facility]  (336) 832-8035   °[facility]    (336) 832-7272   °[facility] [address] °[place], [place] [address] (336) 832-4777   °[facility] [address], °[place] (336) 271-4999   °[facility]    (336) 373-0678   °[facility]    (336) 272-1050   °[facility] ° [address], [place] Phone:  (336) 832-4444, Fax:  (336) 832-4440 Hours of Operation:  9 am - 6 pm, M-F.  Also accepts Medicaid/Medicare and self-pay.  °[facility] ° [address], [place] Phone: (336) 832-3150, Fax: (336) 832-3151. Hours of Operation:  8:30 am - 5:30 pm, M-F.  Also accepts Medicaid and self-pay.  °[facility] [place] [address], [place] Phone: (336) 878-6027   °[facility] [address], [place],  (336)723-1848, Ext. 123 Mondays & Thursdays: 7-9 AM.  First 15 patients are seen on a first come, first serve  basis. °  ° °Medicaid-accepting [place] Providers: ° °Organization         Address  Phone   Notes  °[facility] [address], [place] (336) 641-2100 Also accepts self-pay patients.  °[facility] [address], [place] ° (336) 856-9996   °[facility] [address], [place] (336) 288-8857   °[facility] [address] [place] [address], [place] (336) 299-7000   °Veita Bland [address], [place]  ° (336) 373-1557 Only accepts [place] Access Medicaid patients after they have their name applied to their card.  ° °Self-Pay (no insurance) in [place]: ° °Organization         Address  Phone   Notes  °Sickle Cell Patients, [facility] [address], [place] (  336) 832-1970   °[facility] [address], [place] (336) 832-4400   °[facility] ° [address], [place] (336) 992-4800   °[facility]/Dr. Osei-Bonsu ° [address] [place] [address], [place] or [address], [place] (336) 841-8500 Phone number for both [place] and [place] locations is the same.  °[facility] [address], [place] (336) 299-0000   °[facility] [address] [place] [address], [place] or [address] (336) 852-7530 °(336) 878-2260   °[facility] [address], [place] (336) 350-1642, phone; (336) 294-5005, fax Sees patients 1st and 3rd Saturday of every month.  Must not qualify for public or private insurance (i.e. Medicaid, Medicare, [place] Health Choice, Veterans' Benefits) • Household income should be no more than 200% of the poverty level •The clinic cannot treat you if you are pregnant or think you are pregnant • Sexually transmitted diseases are not treated at the clinic.  ° ° °Dental Care: °Organization         Address  Phone  Notes  °[place]  [facility] [address], [place] (336) 641-6152 Accepts children up to age 21 who are enrolled in Medicaid or [place] Health Choice; pregnant women with a Medicaid card; and children who have applied for Medicaid or [place] Health Choice, but were declined, whose parents can pay a reduced fee at time of service.  °[facility] [place]  [address], [place] (336) 641-7733 Accepts children up to age 21 who are enrolled in Medicaid or [place] Health Choice; pregnant women with a Medicaid card; and children who have applied for Medicaid or [place] Health Choice, but were declined, whose parents can pay a reduced fee at time of service.  °[facility] ° [address], [place] (336) 641-4533 Patients are seen by appointment only. Walk-ins are not accepted. [facility] will see patients 18 years of age and older. °Monday - Tuesday (8am-5pm) °Most Wednesdays (8:30-5pm) °$30 per visit, cash only  °[facility] ° [address], [place] (336) 641-4533 Patients are seen by appointment only. Walk-ins are not accepted. [facility] will see patients 18 years of age and older. °One Wednesday Evening (Monthly: Volunteer Based).  $30 per visit, cash only  °[facility]  (919) 537-3737 for adults; Children under age 4, call Graduate Pediatric Dentistry at (919) [phone]. Children aged 4-14, please call (919) [phone] to request a pediatric application. ° Dental services are provided in all areas of dental care including fillings, crowns and bridges, complete and partial dentures, implants, gum treatment, root canals, and extractions. Preventive care is also provided. Treatment is provided to both adults and children. °Patients are selected via a lottery and there is often a waiting list. °  °[facility] [address], °[place] ° (336) 763-8833  [email or web address].[email or web address] °  °[facility] [address], [place], [place] (336)723-1848, Ext. 123 Second and Fourth Thursday of each month, opens at 6:30 AM; Clinic ends at 9 AM.  Patients are seen on a first-come first-served basis, and a limited number are seen during each clinic.  ° °[facility] ° [address], [place], [place] (336) 723-7904   Eligibility Requirements °You must have lived in [place], [place], or [place] counties   for at least the last three months. °  You cannot be eligible for state or federal sponsored healthcare insurance, including Veterans Administration, Medicaid, or Medicare. °  You generally cannot be eligible for healthcare insurance through your employer.  °  How to apply: °Eligibility screenings are held every Tuesday and Wednesday afternoon from 1:00 pm until 4:00 pm. You do not need an appointment for the interview!  °[facility] [address], [place], [place] 336-631-2330   °[facility]  336-342-8273   °[facility]  336-703-3100   °[facility]  336-570-6415   ° °Behavioral Health Resources in the Community: °Intensive Outpatient Programs °Organization         Address  Phone  Notes  °[facility] [address], [place], [place] 336-878-6098   °[facility] [address], [place], [place] 336-832-9800   °[facility] [address], [place], [place] ° 336-882-2125   °[facility] [address],  °[place], [place] 1-800-853-5163 or 336-641-4981   °Substance Abuse Resources °Organization         Address  Phone  Notes  °[facility]  336-882-2125   °[facility]  336-784-9470   °[facility]  336-285-9073   °[facility]  336-845-3988   °Residential & Outpatient Substance Abuse Program  1-800-659-3381   °Psychological Services °Organization          Address  Phone  Notes  °[facility]  336- 832-9600   °[facility]  336- 378-7881   °[facility] [address], [place] 1-800-853-5163 or 336-641-4981   ° °Mobile Crisis Teams °Organization         Address  Phone  Notes  °[facility], Mobile Crisis Care Unit  1-877-626-1772   °Assertive °[facility] ° [address] [place], [place] 336-834-9664   °Sharon DeEsch [address] °[place] 336-554-5454   ° °Self-Help/Support Groups °Organization         Address  Phone             Notes  °[facility] - variety of support groups  336- 373-1402 Call for more information  °Narcotics Anonymous (NA), [facility] [address], °[place]  2 meetings at this location  ° °Residential Treatment Programs °Organization         Address  Phone  Notes  °[facility] [address],    °[place]  1-866-801-8205   °[facility] ° [address], [place], [place] 704-293-8524   °[facility] [address], [place] 336-845-3988 Admissions: 8am-3pm M-F  °[facility] [address]    °[place], [place] 336-841-1104   °[facility] [address], [place], [place] 336-379-7146   °[facility] [address]  °[place], [place] 336-285-9073   °[facility] Intensive Outpatient [address], [place], [place] 336-852-3033   °[facility] [address]  °[place], [place] 1-877-615-2722 or 336-784-9470   °[facility] [address] [place], [place] 336-227-7417 Accepts Medicaid  °[facility] [address]  °[place]  1-800-659-3381 Substance Abuse/Addiction Treatment  ° °[place] Behavioral Health Resources °Organization         Address  Phone  Notes  °[facility]  (888)   581-9988   °Julie Brannon, PhD [address] [place], [place]   (336) 349-5553 or (336) 951-0000   °[facility]   [address] °[place], [place] (336) 349-4454   °[facility] [address], [place], [place] (336) 342-8316 Insurance/Medicaid/sponsorship through [facility]  °[facility] [address]                                    , [place] (336) 342-8316 Therapy/tele-psych/case  °[facility] [address]  ° , [place] (336) 349-2233    °Dr. Arfeen  (336) 349-4544   °[facility] [place]  [employer] [place] [facility] 1) [address],  °2) [address], [place] °3)  [address], [place] (336) 349-3220 °(336) 342-7768 ° °(336) 342-8140   °[place] [facility] (336) 342-1394 or (336) 342-3537 (After Hours)    ° ° ° °

## 2014-08-03 NOTE — ED Notes (Signed)
Patient states he has been having this dental pain and facial swelling for 3 weeks. He states that the pressure has now spread to his right nostril. He has not been taking any medications for it. Today the pain and swelling is much worse. Patient does not have a dentist at this time.

## 2014-08-03 NOTE — ED Provider Notes (Signed)
CSN: [id number]     Arrival date & time 08/03/14  0919 History   First MD Initiated Contact with Patient 08/03/14 [phone]     Chief Complaint  Patient presents with  . Dental Pain     (Consider location/radiation/quality/duration/timing/severity/associated sxs/prior Treatment) HPI Brandon Mcneil is a 33 year old male with past medical history of diabetes, depression, hypertension, hypercholesterolemia genital the ER with dental pain and facial swelling. Patient states the pain began gradually approximately 3-4 weeks ago, and has since worsened. He states the pain in his upper right tooth has progressed into his upper jaw, cheek and right nostril. Patient denies any associated fever, nausea, vomiting, dysphagia, sore throat, shortness of breath.  Past Medical History  Diagnosis Date  . Diabetes   . Depression   . Hypertension   . Hypercholesteremia    History reviewed. No pertinent past surgical history. History reviewed. No pertinent family history. History  Substance Use Topics  . Smoking status: Current Every Day Smoker -- 0.50 packs/day    Types: Cigarettes  . Smokeless tobacco: Not on file  . Alcohol Use: Yes     Comment: occasional    Review of Systems  Constitutional: Negative for fever.  HENT: Positive for dental problem.   Eyes: Negative for visual disturbance.  Respiratory: Negative for shortness of breath.   Cardiovascular: Negative for chest pain.  Gastrointestinal: Negative for nausea, vomiting and abdominal pain.  Genitourinary: Negative for dysuria.  Skin: Negative for rash.  Neurological: Negative for dizziness, syncope, weakness and numbness.  Psychiatric/Behavioral: Negative.       Allergies  Penicillins  Home Medications   Prior to Admission medications   Medication Sig Start Date End Date Taking? Authorizing Provider  acetaminophen (TYLENOL) 500 MG tablet Take 1,000 mg by mouth every 6 (six) hours as needed for moderate pain.    Historical  Provider, MD  cephALEXin (KEFLEX) 500 MG capsule Take 1 capsule (500 mg total) by mouth 4 (four) times daily. 01/31/14   Elson AreasLeslie K Sofia, PA-C  clindamycin (CLEOCIN) 150 MG capsule Take 3 capsules (450 mg total) by mouth 3 (three) times daily. 08/03/14   Brandon FantasiaJoseph W Zarrah Loveland, PA-C  FLUoxetine (PROZAC) 20 MG capsule Take 1 capsule (20 mg total) by mouth daily. RESUME THIS AS YOU WERE TAKING AT HOME 09/14/13   Russella DarAllison L Ellis, NP  glucose blood ([facility] GLUCOSE TEST STRIPS) test strip Use as instructed 01/31/14   Elson AreasLeslie K Sofia, PA-C  HYDROcodone-acetaminophen (NORCO/VICODIN) 5-325 MG per tablet Take 1-2 tablets by mouth every 6 (six) hours as needed for moderate pain or severe pain. 08/03/14   Brandon FantasiaJoseph W Carolee Channell, PA-C  ibuprofen (ADVIL,MOTRIN) 200 MG tablet Take 600 mg by mouth every 6 (six) hours as needed for moderate pain.    Historical Provider, MD  insulin NPH-regular Human (RELION 70/30) (70-30) 100 UNIT/ML injection Inject 40-50 Units into the skin 2 (two) times daily with a meal. 01/31/14   Elson AreasLeslie K Sofia, PA-C  lisinopril (PRINIVIL,ZESTRIL) 5 MG tablet Take 1 tablet (5 mg total) by mouth daily. 01/31/14   Elson AreasLeslie K Sofia, PA-C  lurasidone 20 MG TABS Take 1 tablet (20 mg total) by mouth daily with breakfast. 09/14/13   Russella DarAllison L Ellis, NP   BP 146/90 mmHg  Pulse 77  Temp(Src) 98.3 F (36.8 C) (Oral)  Resp 18  SpO2 99% Physical Exam  Constitutional: He is oriented to person, place, and time. He appears well-developed and well-nourished. No distress.  HENT:  Head: Normocephalic and atraumatic.  Mouth/Throat:  Uvula is midline, oropharynx is clear and moist and mucous membranes are normal. No oral lesions. No trismus in the jaw. Dental abscesses and dental caries present. No uvula swelling. No oropharyngeal exudate, posterior oropharyngeal edema, posterior oropharyngeal erythema or tonsillar abscesses.  3 cm abscess noted in upper right lateral gum line. Roof of mouth without induration or erythema or  swelling. Floor of mouth soft, and gumline unremarkable. No signs of cellulitis, induration or swelling. No cervical lymphadenopathy.  Eyes: Right eye exhibits no discharge. Left eye exhibits no discharge. No scleral icterus.  Neck: Normal range of motion.  Pulmonary/Chest: Effort normal. No respiratory distress.  Musculoskeletal: Normal range of motion.  Neurological: He is alert and oriented to person, place, and time.  Skin: Skin is warm and dry. He is not diaphoretic.  Psychiatric: He has a normal mood and affect.  Nursing note and vitals reviewed.   ED Course  INCISION AND DRAINAGE Date/Time: 08/03/2014 11:28 AM Performed by: Brandon FantasiaMINTZ, Brandon W Authorized by: Brandon FantasiaMINTZ, Brandon W Consent: Verbal consent obtained. Risks and benefits: risks, benefits and alternatives were discussed Consent given by: patient Patient identity confirmed: verbally with patient Time out: Immediately prior to procedure a "time out" was called to verify the correct patient, procedure, equipment, support staff and site/side marked as required. Type: abscess Body area: mouth Location details: alveolar process Anesthesia: local infiltration Local anesthetic: lidocaine 1% without epinephrine Anesthetic total: 1 ml Patient sedated: no Needle gauge: 22 Incision type: single straight Complexity: simple Drainage: purulent Drainage amount: moderate Wound treatment: wound left open Patient tolerance: Patient tolerated the procedure well with no immediate complications Comments: Dental abscess.  Drained with needle during anesthesia.     (including critical care time) Labs Review Labs Reviewed - No data to display  Imaging Review No results found.   EKG Interpretation None      MDM   Final diagnoses:  Dental abscess   Patient with toothache.  Abscess noted in right upper premolar lateral region of gums, drained as noted in procedure note above.  Exam unconcerning for Ludwig's angina or spread of  infection.  Will treat with penicillin and pain medicine.  Urged patient to follow-up with dentist.  Discussed return precautions with patient and patient was agreeable to this plan. I encouraged patient to call or return to the ER should he have any worsening of symptoms or should he have any questions or concerns.  BP 146/90 mmHg  Pulse 77  Temp(Src) 98.3 F (36.8 C) (Oral)  Resp 18  SpO2 99%  Signed,  Brandon MowJoe Adetokunbo Mccadden, PA-C 3:46 PM  Pt seen and discussed with Dr. Zadie Rhineonald Wickline, MD  Brandon FantasiaJoseph W Kailon Treese, PA-C 08/03/14 1546  Brandon FantasiaJoseph W Jadarion Halbig, PA-C 08/03/14 1547  Joya Gaskinsonald W Wickline, MD 08/03/14 [phone]

## 2014-12-27 ENCOUNTER — Emergency Department (HOSPITAL_COMMUNITY)
Admission: EM | Admit: 2014-12-27 | Discharge: 2014-12-27 | Disposition: A | Discharge status: Home / Self Care | Payer: Self-pay | Attending: Emergency Medicine | Admitting: Emergency Medicine

## 2014-12-27 ENCOUNTER — Encounter (HOSPITAL_COMMUNITY): Payer: Self-pay | Admitting: *Deleted

## 2014-12-27 DIAGNOSIS — Z720 Tobacco use: Secondary | ICD-10-CM | POA: Insufficient documentation

## 2014-12-27 DIAGNOSIS — K0381 Cracked tooth: Secondary | ICD-10-CM | POA: Insufficient documentation

## 2014-12-27 DIAGNOSIS — I10 Essential (primary) hypertension: Secondary | ICD-10-CM | POA: Insufficient documentation

## 2014-12-27 DIAGNOSIS — Z79899 Other long term (current) drug therapy: Secondary | ICD-10-CM | POA: Insufficient documentation

## 2014-12-27 DIAGNOSIS — Z792 Long term (current) use of antibiotics: Secondary | ICD-10-CM | POA: Insufficient documentation

## 2014-12-27 DIAGNOSIS — Z794 Long term (current) use of insulin: Secondary | ICD-10-CM | POA: Insufficient documentation

## 2014-12-27 DIAGNOSIS — Z760 Encounter for issue of repeat prescription: Secondary | ICD-10-CM | POA: Insufficient documentation

## 2014-12-27 DIAGNOSIS — K088 Other specified disorders of teeth and supporting structures: Secondary | ICD-10-CM | POA: Insufficient documentation

## 2014-12-27 DIAGNOSIS — R739 Hyperglycemia, unspecified: Secondary | ICD-10-CM

## 2014-12-27 DIAGNOSIS — L0231 Cutaneous abscess of buttock: Secondary | ICD-10-CM | POA: Insufficient documentation

## 2014-12-27 DIAGNOSIS — K651 Peritoneal abscess: Secondary | ICD-10-CM

## 2014-12-27 DIAGNOSIS — Z880 Allergy status to penicillin: Secondary | ICD-10-CM | POA: Insufficient documentation

## 2014-12-27 DIAGNOSIS — E1165 Type 2 diabetes mellitus with hyperglycemia: Secondary | ICD-10-CM | POA: Insufficient documentation

## 2014-12-27 DIAGNOSIS — K029 Dental caries, unspecified: Secondary | ICD-10-CM

## 2014-12-27 DIAGNOSIS — F329 Major depressive disorder, single episode, unspecified: Secondary | ICD-10-CM | POA: Insufficient documentation

## 2014-12-27 LAB — I-STAT CHEM 8, ED
BUN: 21 mg/dL — AB (ref 6–20)
CREATININE: 1.1 mg/dL (ref 0.61–1.24)
Calcium, Ion: 1.24 mmol/L — ABNORMAL HIGH (ref 1.12–1.23)
Chloride: 100 mmol/L — ABNORMAL LOW (ref 101–111)
Glucose, Bld: 254 mg/dL — ABNORMAL HIGH (ref 65–99)
HCT: 52 % (ref 39.0–52.0)
HEMOGLOBIN: 17.7 g/dL — AB (ref 13.0–17.0)
POTASSIUM: 4 mmol/L (ref 3.5–5.1)
Sodium: 138 mmol/L (ref 135–145)
TCO2: 24 mmol/L (ref 0–100)

## 2014-12-27 LAB — CBG MONITORING, ED
GLUCOSE-CAPILLARY: 168 mg/dL — AB (ref 65–99)
Glucose-Capillary: 309 mg/dL — ABNORMAL HIGH (ref 65–99)

## 2014-12-27 MED ORDER — LISINOPRIL 5 MG PO TABS: 5.0000 mg | ORAL_TABLET | Freq: Every day | ORAL | Status: DC

## 2014-12-27 MED ORDER — SODIUM CHLORIDE 0.9 % IV BOLUS (SEPSIS)
1000.0000 mL | Freq: Once | INTRAVENOUS | Status: AC
  Administered 2014-12-27: 1000 mL via INTRAVENOUS

## 2014-12-27 MED ORDER — OXYCODONE-ACETAMINOPHEN 5-325 MG PO TABS: 1.0000 | ORAL_TABLET | Freq: Three times a day (TID) | ORAL | Status: DC | PRN

## 2014-12-27 MED ORDER — LIDOCAINE HCL (PF) 1 % IJ SOLN
10.0000 mL | Freq: Once | INTRAMUSCULAR | Status: AC
  Administered 2014-12-27: 10 mL

## 2014-12-27 MED ORDER — CEPHALEXIN 500 MG PO CAPS: 500.0000 mg | ORAL_CAPSULE | Freq: Four times a day (QID) | ORAL | Status: DC

## 2014-12-27 MED ORDER — LIDOCAINE HCL (PF) 1 % IJ SOLN
INTRAMUSCULAR | Status: AC
  Filled 2014-12-27: qty 10

## 2014-12-27 NOTE — ED Notes (Signed)
Pt c/o abscess on buttocks and weakness since Tuesday

## 2014-12-27 NOTE — ED Notes (Signed)
Pt st's he has abscess on left buttock x's 2 days with no drainage.

## 2014-12-27 NOTE — ED Provider Notes (Signed)
CSN: [id number]     Arrival date & time 12/27/14  1712 History   This chart was scribed for non-physician practitioner, Felicie Mornavid Kendon Sedeno, NP working with Linwood DibblesJon Knapp, MD by Buckner MaltaJason Robinson, ED scribe. This patient was seen in room TR08C/TR08C and the patient's care was started at 5:56 PM   Chief Complaint  Patient presents with  . Fatigue  . Nausea  . Abscess      Patient is a 34 y.o. male presenting with abscess. The history is provided by the patient. No language interpreter was used.  Abscess Location:  Ano-genital Ano-genital abscess location:  L buttock Abscess quality: painful   Abscess quality: not draining   Red streaking: no   Duration:  2 days Progression:  Unchanged Pain details:    Severity:  Moderate   Duration:  2 days   Timing:  Constant   Progression:  Unchanged Chronicity:  New Context: diabetes   Relieved by:  Nothing Worsened by:  Nothing tried Ineffective treatments:  None tried Associated symptoms: headaches    HPI Comments: Brandon Mcneil is a 34 y.o. male with a history of HTN, DM, and depression who presents to the Emergency Department complaining of a boil located on his left buttock for the past 2 days . The patient lists constant, moderate pain to the immediate area in addition to Diaphoresis, elbow and knee pain, and HA for the past 3 days as associated symptoms. The patient states that he was diagnosed with DM four months ago. The patient states that he checked his sugar before coming to the ED and it was at 208, but when he got to the ED it was taken again and he was told it was at 309. He has not been testing his sugar regularly because he says that he ran out of test strips, and he is unable to follow up with his PCP due to a high Co-pay. The patient denies any history of kidney or heart related problems. The patient denies GI upset as an associated symptom.  Past Medical History  Diagnosis Date  . Diabetes   . Depression   . Hypertension   .  Hypercholesteremia    History reviewed. No pertinent past surgical history. History reviewed. No pertinent family history. History  Substance Use Topics  . Smoking status: Current Every Day Smoker -- 0.50 packs/day    Types: Cigarettes  . Smokeless tobacco: Not on file  . Alcohol Use: Yes     Comment: occasional    Review of Systems  Constitutional: Positive for diaphoresis.  Musculoskeletal: Positive for myalgias and arthralgias.  Skin: Positive for wound (abscess L buttock).  Neurological: Positive for headaches.  All other systems reviewed and are negative.     Allergies  Penicillins  Home Medications   Prior to Admission medications   Medication Sig Start Date End Date Taking? Authorizing Provider  acetaminophen (TYLENOL) 500 MG tablet Take 1,000 mg by mouth every 6 (six) hours as needed for moderate pain.    Historical Provider, MD  cephALEXin (KEFLEX) 500 MG capsule Take 1 capsule (500 mg total) by mouth 4 (four) times daily. 01/31/14   Elson AreasLeslie K Sofia, PA-C  clindamycin (CLEOCIN) 150 MG capsule Take 3 capsules (450 mg total) by mouth 3 (three) times daily. 08/03/14   Ladona MowJoe Mintz, PA-C  FLUoxetine (PROZAC) 20 MG capsule Take 1 capsule (20 mg total) by mouth daily. RESUME THIS AS YOU WERE TAKING AT HOME 09/14/13   Russella DarAllison L Ellis, NP  glucose  blood (RELION GLUCOSE TEST STRIPS) test strip Use as instructed 01/31/14   Elson AreasLeslie K Sofia, PA-C  HYDROcodone-acetaminophen (NORCO/VICODIN) 5-325 MG per tablet Take 1-2 tablets by mouth every 6 (six) hours as needed for moderate pain or severe pain. 08/03/14   Ladona MowJoe Mintz, PA-C  ibuprofen (ADVIL,MOTRIN) 200 MG tablet Take 600 mg by mouth every 6 (six) hours as needed for moderate pain.    Historical Provider, MD  insulin NPH-regular Human (RELION 70/30) (70-30) 100 UNIT/ML injection Inject 40-50 Units into the skin 2 (two) times daily with a meal. 01/31/14   Elson AreasLeslie K Sofia, PA-C  lisinopril (PRINIVIL,ZESTRIL) 5 MG tablet Take 1 tablet (5 mg  total) by mouth daily. 01/31/14   Elson AreasLeslie K Sofia, PA-C  lurasidone 20 MG TABS Take 1 tablet (20 mg total) by mouth daily with breakfast. 09/14/13   Russella DarAllison L Ellis, NP   There were no vitals taken for this visit.   Physical Exam  Constitutional: He is oriented to person, place, and time. He appears well-developed and well-nourished. No distress.  HENT:  Head: Normocephalic and atraumatic.  Eyes: Conjunctivae and EOM are normal.  Neck: Neck supple.  Cardiovascular: Normal rate.   Pulmonary/Chest: Breath sounds normal. No respiratory distress.  Genitourinary:     Neurological: He is alert and oriented to person, place, and time.  Skin: Skin is warm.  Psychiatric: His behavior is normal.  Nursing note and vitals reviewed.   ED Course  Procedures (including critical care time) DIAGNOSTIC STUDIES: Oxygen Saturation is 99% on RA, Normal by my interpretation.    COORDINATION OF CARE: 6:00 PM Discussed treatment plan at bedside. Pt agreed to plan.     INCISION AND DRAINAGE PROCEDURE NOTE: Patient identification was confirmed and verbal consent was obtained. This procedure was performed by Felicie Mornavid Arial Galligan, NP at 7:40 PM. Site: Left buttock Sterile procedures observed: Needle size: 27 Anesthetic used Lidocaine 1% Blade size: 11 Drainage: moderate Complexity: Complex  Site anesthetized, incision made over site, wound drained and explored loculations,  covered with dry, sterile dressing.  Pt tolerated procedure well without complications.  Instructions for care discussed verbally and pt provided with additional written instructions for homecare and f/u.      Labs Review Labs Reviewed  I-STAT CHEM 8, ED - Abnormal; Notable for the following:    Chloride 100 (*)    BUN 21 (*)    Glucose, Bld 254 (*)    Calcium, Ion 1.24 (*)    Hemoglobin 17.7 (*)    All other components within normal limits  CBG MONITORING, ED - Abnormal; Notable for the following:    Glucose-Capillary 309  (*)    All other components within normal limits    Imaging Review No results found.   EKG Interpretation None     Patient presents to ED today with complaint of abscess to perineal area, along with elevated blood sugar.  Initial CBG > 300.  IV fluids started with one liter infused with reduction of blood glucose to 168.  Abscess assessed via ultrasound by Dr. Lynelle DoctorKnapp.  Abscess drained.  Patient also complaining of dental pain.  Exam reveals widespread poor dentition, with several decayed and broken teeth noted.  Will start on antibiotic.  Patient also requesting refill for his antihypertensive--prescription provided for lisinopril. [facility] as he does not currently have a primary care provider.  MDM   Final diagnoses:  None  Perineal abscess. Dental pain. Hyperglycemia.    I personally performed  the services described in this documentation, which was scribed in my presence. The recorded information has been reviewed and is accurate.     [name], NP 12/27/14 1610  Linwood Dibbles, MD 12/30/14 [phone]

## 2014-12-27 NOTE — Discharge Instructions (Signed)
Dental Pain A tooth ache may be caused by cavities (tooth decay). Cavities expose the nerve of the tooth to air and hot or cold temperatures. It may come from an infection or abscess (also called a boil or furuncle) around your tooth. It is also often caused by dental caries (tooth decay). This causes the pain you are having. DIAGNOSIS  Your caregiver can diagnose this problem by exam. TREATMENT   If caused by an infection, it may be treated with medications which kill germs (antibiotics) and pain medications as prescribed by your caregiver. Take medications as directed.  Only take over-the-counter or prescription medicines for pain, discomfort, or fever as directed by your caregiver.  Whether the tooth ache today is caused by infection or dental disease, you should see your dentist as soon as possible for further care. SEEK MEDICAL CARE IF: The exam and treatment you received today has been provided on an emergency basis only. This is not a substitute for complete medical or dental care. If your problem worsens or new problems (symptoms) appear, and you are unable to meet with your dentist, call or return to this location. SEEK IMMEDIATE MEDICAL CARE IF:   You have a fever.  You develop redness and swelling of your face, jaw, or neck.  You are unable to open your mouth.  You have severe pain uncontrolled by pain medicine. MAKE SURE YOU:   Understand these instructions.  Will watch your condition.  Will get help right away if you are not doing well or get worse. Document Released: 08/03/2005 Document Revised: 10/26/2011 Document Reviewed: 03/21/2008 [facility] Patient Information 2015 [place]. This information is not intended to replace advice given to you by your health care provider. Make sure you discuss any questions you have with your health care provider. Abscess An abscess is an infected area that contains a collection of pus and debris.It can occur in almost any part of  the body. An abscess is also known as a furuncle or boil. CAUSES  An abscess occurs when tissue gets infected. This can occur from blockage of oil or sweat glands, infection of hair follicles, or a minor injury to the skin. As the body tries to fight the infection, pus collects in the area and creates pressure under the skin. This pressure causes pain. People with weakened immune systems have difficulty fighting infections and get certain abscesses more often.  SYMPTOMS Usually an abscess develops on the skin and becomes a painful mass that is red, warm, and tender. If the abscess forms under the skin, you may feel a moveable soft area under the skin. Some abscesses break open (rupture) on their own, but most will continue to get worse without care. The infection can spread deeper into the body and eventually into the bloodstream, causing you to feel ill.  DIAGNOSIS  Your caregiver will take your medical history and perform a physical exam. A sample of fluid may also be taken from the abscess to determine what is causing your infection. TREATMENT  Your caregiver may prescribe antibiotic medicines to fight the infection. However, taking antibiotics alone usually does not cure an abscess. Your caregiver may need to make a small cut (incision) in the abscess to drain the pus. In some cases, gauze is packed into the abscess to reduce pain and to continue draining the area. HOME CARE INSTRUCTIONS   Only take over-the-counter or prescription medicines for pain, discomfort, or fever as directed by your caregiver.  If you were prescribed antibiotics,  take them as directed. Finish them even if you start to feel better.  If gauze is used, follow your caregiver's directions for changing the gauze.  To avoid spreading the infection:  Keep your draining abscess covered with a bandage.  Wash your hands well.  Do not share personal care items, towels, or whirlpools with others.  Avoid skin contact with  others.  Keep your skin and clothes clean around the abscess.  Keep all follow-up appointments as directed by your caregiver. SEEK MEDICAL CARE IF:   You have increased pain, swelling, redness, fluid drainage, or bleeding.  You have muscle aches, chills, or a general ill feeling.  You have a fever. MAKE SURE YOU:   Understand these instructions.  Will watch your condition.  Will get help right away if you are not doing well or get worse. Document Released: 05/13/2005 Document Revised: 02/02/2012 Document Reviewed: 10/16/2011 [facility] Patient Information 2015 [place], [place]. This information is not intended to replace advice given to you by your health care provider. Make sure you discuss any questions you have with your health care provider.

## 2014-12-27 NOTE — ED Notes (Signed)
CBG 168. 

## 2015-03-21 ENCOUNTER — Emergency Department (HOSPITAL_COMMUNITY)
Admission: EM | Admit: 2015-03-21 | Discharge: 2015-03-22 | Disposition: A | Discharge status: Home / Self Care | Payer: Self-pay | Attending: Emergency Medicine | Admitting: Emergency Medicine

## 2015-03-21 ENCOUNTER — Encounter (HOSPITAL_COMMUNITY): Payer: Self-pay | Admitting: Emergency Medicine

## 2015-03-21 DIAGNOSIS — E1165 Type 2 diabetes mellitus with hyperglycemia: Secondary | ICD-10-CM | POA: Insufficient documentation

## 2015-03-21 DIAGNOSIS — F329 Major depressive disorder, single episode, unspecified: Secondary | ICD-10-CM | POA: Insufficient documentation

## 2015-03-21 DIAGNOSIS — R739 Hyperglycemia, unspecified: Secondary | ICD-10-CM

## 2015-03-21 DIAGNOSIS — L97529 Non-pressure chronic ulcer of other part of left foot with unspecified severity: Secondary | ICD-10-CM | POA: Insufficient documentation

## 2015-03-21 DIAGNOSIS — Z880 Allergy status to penicillin: Secondary | ICD-10-CM | POA: Insufficient documentation

## 2015-03-21 DIAGNOSIS — Z794 Long term (current) use of insulin: Secondary | ICD-10-CM | POA: Insufficient documentation

## 2015-03-21 DIAGNOSIS — E780 Pure hypercholesterolemia: Secondary | ICD-10-CM | POA: Insufficient documentation

## 2015-03-21 DIAGNOSIS — Z760 Encounter for issue of repeat prescription: Secondary | ICD-10-CM | POA: Insufficient documentation

## 2015-03-21 DIAGNOSIS — I10 Essential (primary) hypertension: Secondary | ICD-10-CM | POA: Insufficient documentation

## 2015-03-21 DIAGNOSIS — Z720 Tobacco use: Secondary | ICD-10-CM | POA: Insufficient documentation

## 2015-03-21 LAB — CBG MONITORING, ED: GLUCOSE-CAPILLARY: 418 mg/dL — AB (ref 65–99)

## 2015-03-21 NOTE — ED Notes (Signed)
Pt states he has a blister on his pinky toe on his left foot  Pt took a pin and popped it  Pt is diabetic and wants to have it looked at  Pt also has hypertension and has been out of his meds for about 3 weeks  Pt's blood pressure is elevated

## 2015-03-22 LAB — CBC WITH DIFFERENTIAL/PLATELET
BASOS ABS: 0 10*3/uL (ref 0.0–0.1)
Basophils Relative: 0 % (ref 0–1)
EOS ABS: 0.2 10*3/uL (ref 0.0–0.7)
Eosinophils Relative: 3 % (ref 0–5)
HCT: 42.1 % (ref 39.0–52.0)
Hemoglobin: 15.5 g/dL (ref 13.0–17.0)
LYMPHS ABS: 3.1 10*3/uL (ref 0.7–4.0)
LYMPHS PCT: 39 % (ref 12–46)
MCH: 31.5 pg (ref 26.0–34.0)
MCHC: 36.8 g/dL — ABNORMAL HIGH (ref 30.0–36.0)
MCV: 85.6 fL (ref 78.0–100.0)
MONO ABS: 0.7 10*3/uL (ref 0.1–1.0)
MONOS PCT: 9 % (ref 3–12)
NEUTROS ABS: 4 10*3/uL (ref 1.7–7.7)
Neutrophils Relative %: 49 % (ref 43–77)
Platelets: 237 10*3/uL (ref 150–400)
RBC: 4.92 MIL/uL (ref 4.22–5.81)
RDW: 13.8 % (ref 11.5–15.5)
WBC: 8 10*3/uL (ref 4.0–10.5)

## 2015-03-22 LAB — BASIC METABOLIC PANEL
ANION GAP: 7 (ref 5–15)
BUN: 15 mg/dL (ref 6–20)
CALCIUM: 9 mg/dL (ref 8.9–10.3)
CO2: 23 mmol/L (ref 22–32)
Chloride: 105 mmol/L (ref 101–111)
Creatinine, Ser: 1.14 mg/dL (ref 0.61–1.24)
GFR calc Af Amer: >60 mL/min (ref >60)
Glucose, Bld: 391 mg/dL — ABNORMAL HIGH (ref 65–99)
POTASSIUM: 4.3 mmol/L (ref 3.5–5.1)
Sodium: 135 mmol/L (ref 135–145)

## 2015-03-22 LAB — CBG MONITORING, ED
Glucose-Capillary: 305 mg/dL — ABNORMAL HIGH (ref 65–99)
Glucose-Capillary: 179 mg/dL — ABNORMAL HIGH (ref 65–99)

## 2015-03-22 MED ORDER — INSULIN ASPART 100 UNIT/ML ~~LOC~~ SOLN
10.0000 [IU] | Freq: Once | SUBCUTANEOUS | Status: AC
  Administered 2015-03-22: 10 [IU] via SUBCUTANEOUS
  Filled 2015-03-22: qty 1

## 2015-03-22 MED ORDER — GLUCOSE BLOOD VI STRP: ORAL_STRIP | Status: DC

## 2015-03-22 MED ORDER — LISINOPRIL 20 MG PO TABS
20.0000 mg | ORAL_TABLET | Freq: Once | ORAL | Status: AC
  Administered 2015-03-22: 20 mg via ORAL
  Filled 2015-03-22: qty 1

## 2015-03-22 MED ORDER — CEPHALEXIN 500 MG PO CAPS: 500.0000 mg | ORAL_CAPSULE | Freq: Four times a day (QID) | ORAL | Status: DC

## 2015-03-22 MED ORDER — LISINOPRIL 10 MG PO TABS: 10.0000 mg | ORAL_TABLET | Freq: Every day | ORAL | Status: DC

## 2015-03-22 MED ORDER — IBUPROFEN 800 MG PO TABS: 800.0000 mg | ORAL_TABLET | Freq: Three times a day (TID) | ORAL | Status: DC

## 2015-03-22 NOTE — ED Notes (Signed)
Per pt, blister opened on its own, he pulled the rest of it up with his fingers. Pt states he was diagnosed with Diabetes and admitted into the hospital in January. He admits that he doesn't know much about it. He has lack of knowledge for diabetes and management

## 2015-03-22 NOTE — ED Provider Notes (Signed)
CSN: [id number]     Arrival date & time 03/21/15  2335 History   First MD Initiated Contact with Patient 03/22/15 0009     Chief Complaint  Patient presents with  . Foot Pain  . Hypertension  . Hyperglycemia     (Consider location/radiation/quality/duration/timing/severity/associated sxs/prior Treatment) HPI Comments: Patient also reports he has been out of his lisinopril for 3 weeks and is having elevated blood pressure. He is also concerned about a blister on his left 5th toe that he popped.   Patient is a 34 y.o. male presenting with hyperglycemia. The history is provided by the patient. No language interpreter was used.  Hyperglycemia Blood sugar level PTA:  Unknown Severity:  Unable to specify Onset quality:  Gradual Duration:  3 weeks Timing:  Constant Progression:  Unchanged Chronicity:  Recurrent Diabetes status:  Controlled with insulin Current diabetic therapy:  Subcutaneous insulin Time since last antidiabetic medication:  4 hours Context: noncompliance   Context comment:  Patient is out of test strips so he has not checked his glucose in 3 weeks Relieved by:  Nothing Ineffective treatments:  None tried Associated symptoms: no abdominal pain, no altered mental status, no blurred vision, no dehydration, no dizziness, no fatigue, no fever, no increased appetite, no increased thirst, no nausea, no shortness of breath, no syncope and no vomiting   Risk factors: obesity   Risk factors: no family hx of diabetes, no hx of DKA, no pancreatic disease, no pregnancy and no recent steroid use     Past Medical History  Diagnosis Date  . Diabetes   . Depression   . Hypertension   . Hypercholesteremia    History reviewed. No pertinent past surgical history. History reviewed. No pertinent family history. History  Substance Use Topics  . Smoking status: Current Every Day Smoker -- 0.50 packs/day    Types: Cigarettes  . Smokeless tobacco: Not on file  . Alcohol Use: Yes   Comment: occasional    Review of Systems  Constitutional: Negative for fever and fatigue.  Eyes: Negative for blurred vision.  Respiratory: Negative for shortness of breath.   Cardiovascular: Negative for syncope.  Gastrointestinal: Negative for nausea, vomiting and abdominal pain.  Endocrine: Negative for polydipsia.  Skin: Positive for wound.  Neurological: Negative for dizziness.  All other systems reviewed and are negative.     Allergies  Penicillins  Home Medications   Prior to Admission medications   Medication Sig Start Date End Date Taking? Authorizing Provider  acetaminophen (TYLENOL) 500 MG tablet Take 1,000 mg by mouth every 6 (six) hours as needed for moderate pain.   Yes Historical Provider, MD  ibuprofen (ADVIL,MOTRIN) 200 MG tablet Take 600 mg by mouth every 6 (six) hours as needed for moderate pain.   Yes Historical Provider, MD  insulin NPH-regular Human (RELION 70/30) (70-30) 100 UNIT/ML injection Inject 40-50 Units into the skin 2 (two) times daily with a meal. 01/31/14  Yes Lonia Skinner Sofia, PA-C  lisinopril (PRINIVIL,ZESTRIL) 5 MG tablet Take 1 tablet (5 mg total) by mouth daily. 12/27/14  Yes [name], NP  cephALEXin (KEFLEX) 500 MG capsule Take 1 capsule (500 mg total) by mouth 4 (four) times daily. Patient not taking: Reported on 03/22/2015 12/27/14   [name], NP  clindamycin (CLEOCIN) 150 MG capsule Take 3 capsules (450 mg total) by mouth 3 (three) times daily. Patient not taking: Reported on 03/22/2015 08/03/14   [name], PA-C  FLUoxetine (PROZAC) 20 MG capsule Take 1 capsule (20  mg total) by mouth daily. RESUME THIS AS YOU WERE TAKING AT HOME Patient not taking: Reported on 03/22/2015 09/14/13   [name], NP  glucose blood (RELION GLUCOSE TEST STRIPS) test strip Use as instructed 01/31/14   [name], PA-C  HYDROcodone-acetaminophen (NORCO/VICODIN) 5-325 MG per tablet Take 1-2 tablets by mouth every 6 (six) hours as needed for moderate pain or  severe pain. Patient not taking: Reported on 03/22/2015 08/03/14   [name], PA-C  lurasidone 20 MG TABS Take 1 tablet (20 mg total) by mouth daily with breakfast. Patient not taking: Reported on 03/22/2015 09/14/13   [name], NP  oxyCODONE-acetaminophen (PERCOCET/ROXICET) 5-325 MG per tablet Take 1 tablet by mouth every 8 (eight) hours as needed for severe pain. Patient not taking: Reported on 03/22/2015 12/27/14   [name], NP   BP 171/115 mmHg  Pulse 99  Temp(Src) 98.1 F (36.7 C) (Oral)  Resp 20  SpO2 97% Physical Exam  Constitutional: He is oriented to person, place, and time. He appears well-developed and well-nourished. No distress.  HENT:  Head: Normocephalic and atraumatic.  Eyes: Conjunctivae and EOM are normal.  Neck: Normal range of motion.  Cardiovascular: Normal rate and regular rhythm.  Exam reveals no gallop and no friction rub.   No murmur heard. Pulmonary/Chest: Effort normal and breath sounds normal. He has no wheezes. He has no rales. He exhibits no tenderness.  Abdominal: Soft. He exhibits no distension. There is no tenderness. There is no rebound.  Musculoskeletal: Normal range of motion.  Neurological: He is alert and oriented to person, place, and time. Coordination normal.  Speech is goal-oriented. Moves limbs without ataxia.   Skin: Skin is warm and dry.  Blister of plantar aspect of left fifth toe.   Psychiatric: He has a normal mood and affect. His behavior is normal.  Nursing note and vitals reviewed.   ED Course  Procedures (including critical care time) Labs Review Labs Reviewed  CBC WITH DIFFERENTIAL/PLATELET - Abnormal; Notable for the following:    MCHC 36.8 (*)    All other components within normal limits  BASIC METABOLIC PANEL - Abnormal; Notable for the following:    Glucose, Bld 391 (*)    All other components within normal limits  CBG MONITORING, ED - Abnormal; Notable for the following:    Glucose-Capillary 418 (*)    All other  components within normal limits  CBG MONITORING, ED - Abnormal; Notable for the following:    Glucose-Capillary 305 (*)    All other components within normal limits    Imaging Review No results found.   EKG Interpretation None      MDM   Final diagnoses:  Hyperglycemia  Foot ulcer, left, with unspecified severity  Medication refill    2:08 AM Patient's blood sugar elevated at 391. Patient will have 10 units insulin. Patient also given lisinopril for his blood pressure which is his home medication. Patient not in DKA at this time. Patient will have blood sugar lower and recommended follow up with PCP.   Emilia Beck, PA-C 03/23/15 0144  Marisa Severin, MD 03/25/15 [phone]

## 2015-03-22 NOTE — Discharge Instructions (Signed)
Take medication as directed. Follow up with [facility] for further evaluation and management of your health conditions.

## 2015-04-03 ENCOUNTER — Emergency Department (HOSPITAL_COMMUNITY)
Admission: EM | Admit: 2015-04-03 | Discharge: 2015-04-03 | Disposition: A | Discharge status: Home / Self Care | Payer: Self-pay | Attending: Emergency Medicine | Admitting: Emergency Medicine

## 2015-04-03 ENCOUNTER — Encounter (HOSPITAL_COMMUNITY): Payer: Self-pay | Admitting: *Deleted

## 2015-04-03 DIAGNOSIS — Z792 Long term (current) use of antibiotics: Secondary | ICD-10-CM | POA: Insufficient documentation

## 2015-04-03 DIAGNOSIS — Z79899 Other long term (current) drug therapy: Secondary | ICD-10-CM | POA: Insufficient documentation

## 2015-04-03 DIAGNOSIS — Z791 Long term (current) use of non-steroidal anti-inflammatories (NSAID): Secondary | ICD-10-CM | POA: Insufficient documentation

## 2015-04-03 DIAGNOSIS — K047 Periapical abscess without sinus: Secondary | ICD-10-CM | POA: Insufficient documentation

## 2015-04-03 DIAGNOSIS — Z880 Allergy status to penicillin: Secondary | ICD-10-CM | POA: Insufficient documentation

## 2015-04-03 DIAGNOSIS — Z720 Tobacco use: Secondary | ICD-10-CM | POA: Insufficient documentation

## 2015-04-03 DIAGNOSIS — R739 Hyperglycemia, unspecified: Secondary | ICD-10-CM

## 2015-04-03 DIAGNOSIS — E1165 Type 2 diabetes mellitus with hyperglycemia: Secondary | ICD-10-CM | POA: Insufficient documentation

## 2015-04-03 DIAGNOSIS — I10 Essential (primary) hypertension: Secondary | ICD-10-CM | POA: Insufficient documentation

## 2015-04-03 DIAGNOSIS — Z8659 Personal history of other mental and behavioral disorders: Secondary | ICD-10-CM | POA: Insufficient documentation

## 2015-04-03 DIAGNOSIS — Z794 Long term (current) use of insulin: Secondary | ICD-10-CM | POA: Insufficient documentation

## 2015-04-03 LAB — BASIC METABOLIC PANEL
Anion gap: 9 (ref 5–15)
BUN: 10 mg/dL (ref 6–20)
CALCIUM: 9.4 mg/dL (ref 8.9–10.3)
CO2: 27 mmol/L (ref 22–32)
CREATININE: 1.06 mg/dL (ref 0.61–1.24)
Chloride: 101 mmol/L (ref 101–111)
GFR calc non Af Amer: >60 mL/min (ref >60)
Glucose, Bld: 314 mg/dL — ABNORMAL HIGH (ref 65–99)
Potassium: 4.2 mmol/L (ref 3.5–5.1)
SODIUM: 137 mmol/L (ref 135–145)

## 2015-04-03 LAB — CBC
HCT: 41.6 % (ref 39.0–52.0)
Hemoglobin: 15.4 g/dL (ref 13.0–17.0)
MCH: 31.4 pg (ref 26.0–34.0)
MCHC: 37 g/dL — ABNORMAL HIGH (ref 30.0–36.0)
MCV: 84.7 fL (ref 78.0–100.0)
PLATELETS: 195 10*3/uL (ref 150–400)
RBC: 4.91 MIL/uL (ref 4.22–5.81)
RDW: 13.3 % (ref 11.5–15.5)
WBC: 8.8 10*3/uL (ref 4.0–10.5)

## 2015-04-03 LAB — CBG MONITORING, ED: Glucose-Capillary: 327 mg/dL — ABNORMAL HIGH (ref 65–99)

## 2015-04-03 MED ORDER — CLINDAMYCIN HCL 150 MG PO CAPS: 450.0000 mg | ORAL_CAPSULE | Freq: Three times a day (TID) | ORAL | Status: DC

## 2015-04-03 MED ORDER — HYDROCODONE-ACETAMINOPHEN 5-325 MG PO TABS
1.0000 | ORAL_TABLET | Freq: Once | ORAL | Status: AC
  Administered 2015-04-03: 1 via ORAL
  Filled 2015-04-03: qty 1

## 2015-04-03 NOTE — ED Notes (Signed)
Pt left with all belongings. 

## 2015-04-03 NOTE — Discharge Instructions (Signed)
Please use your insulin as instructed, please get testing strips so that you can monitor your blood sugar. This is your second visit in the month of August for hyperglycemia, you were prescribed your blood pressure medication, testing strips, you need to follow up with the pharmacy to receive your medications.  He need to contact your primary care provider and schedule a visit this week for further evaluation. Please use your medications as directed. Please monitor for new or worsening signs or symptoms, return immediately if any present.

## 2015-04-03 NOTE — ED Notes (Signed)
Pt reports dental pain on left side. Pt noted to have facial swelling. Pt also reports hyperglycemia and being out of his strips. Pt denies any associated symptoms.

## 2015-04-03 NOTE — ED Provider Notes (Signed)
CSN: [id number]     Arrival date & time 04/03/15  1346 History  This chart was scribed for Brandon Mechanic, PA-C working with Brandon Hutching, MD by Brandon Mcneil, ED Scribe. This patient was seen in room TR05C/TR05C and the patient's care was started at 3:41 PM.      Chief Complaint  Patient presents with  . Dental Pain  . Hyperglycemia   The history is provided by the patient. No language interpreter was used.   HPI Comments: Brandon Mcneil is a 34 y.o. male who presents to the Emergency Department complaining of upper left sided dental pain onset 3 days prior. Pt states that he has some associated facial swelling. Pt doesn't report any medications PTA. Pt doesn't report fever, chills, nausea, vomiting, trouble swallowing.  Pt states that he has a Hx of DM and HTN. He states that he feels as if his blood sugar and blood pressure are elevated today. Pt states that he has not been complaint with taking his medications for the past 2 weeks. Pt states that he has been complaint with taking his insulin but states that he has ran out of diabetic strips.    Past Medical History  Diagnosis Date  . Diabetes   . Depression   . Hypertension   . Hypercholesteremia    History reviewed. No pertinent past surgical history. No family history on file. Social History  Substance Use Topics  . Smoking status: Current Every Day Smoker -- 0.50 packs/day    Types: Cigarettes  . Smokeless tobacco: None  . Alcohol Use: Yes     Comment: occasional    Review of Systems  All other systems reviewed and are negative.    Allergies  Penicillins  Home Medications   Prior to Admission medications   Medication Sig Start Date End Date Taking? Authorizing Provider  cephALEXin (KEFLEX) 500 MG capsule Take 1 capsule (500 mg total) by mouth 4 (four) times daily. 03/22/15   Emilia Beck, PA-C  clindamycin (CLEOCIN) 150 MG capsule Take 3 capsules (450 mg total) by mouth 3 (three) times daily. 04/03/15    Brandon Mechanic, PA-C  FLUoxetine (PROZAC) 20 MG capsule Take 1 capsule (20 mg total) by mouth daily. RESUME THIS AS YOU WERE TAKING AT HOME Patient not taking: Reported on 03/22/2015 09/14/13   Brandon Dar, NP  glucose blood test strip Use as instructed 03/22/15   Emilia Beck, PA-C  HYDROcodone-acetaminophen (NORCO/VICODIN) 5-325 MG per tablet Take 1-2 tablets by mouth every 6 (six) hours as needed for moderate pain or severe pain. Patient not taking: Reported on 03/22/2015 08/03/14   [name], PA-C  ibuprofen (ADVIL,MOTRIN) 800 MG tablet Take 1 tablet (800 mg total) by mouth 3 (three) times daily. 03/22/15   Emilia Beck, PA-C  insulin NPH-regular Human (RELION 70/30) (70-30) 100 UNIT/ML injection Inject 40-50 Units into the skin 2 (two) times daily with a meal. 01/31/14   [name], PA-C  lisinopril (PRINIVIL,ZESTRIL) 10 MG tablet Take 1 tablet (10 mg total) by mouth daily. 03/22/15   Emilia Beck, PA-C  lurasidone 20 MG TABS Take 1 tablet (20 mg total) by mouth daily with breakfast. Patient not taking: Reported on 03/22/2015 09/14/13   Brandon Dar, NP  oxyCODONE-acetaminophen (PERCOCET/ROXICET) 5-325 MG per tablet Take 1 tablet by mouth every 8 (eight) hours as needed for severe pain. Patient not taking: Reported on 03/22/2015 12/27/14   Brandon Morn, NP   BP 169/102 mmHg  Pulse 90  Temp(Src) 98.9  F (37.2 C) (Oral)  Resp 20  SpO2 97%   Physical Exam  Constitutional: He is oriented to person, place, and time. He appears well-developed and well-nourished. No distress.  HENT:  Head: Normocephalic and atraumatic.  Swelling to the left face on external exam to the left upper maxillary region. Internal exam shows small area of fluctuance on the gum line on the left maxillary region remainder of gum line normal,  bottom jaw symmetric no signs of infection or abscess, floor of mouth is soft non tender.   Eyes: Conjunctivae and EOM are normal.  Neck: Neck supple. No tracheal  deviation present.  Cardiovascular: Normal rate.   Pulmonary/Chest: Effort normal. No respiratory distress.  Musculoskeletal: Normal range of motion.  Neurological: He is alert and oriented to person, place, and time.  Skin: Skin is warm and dry.  Psychiatric: He has a normal mood and affect. His behavior is normal.  Nursing note and vitals reviewed.   ED Course  Procedures (including critical care time)  INCISION AND DRAINAGE PROCEDURE NOTE: Patient identification was confirmed and verbal consent was obtained. This procedure was performed by Brandon Mechanic, PA-C at 5:07 PM. Site: Left upper gum  Sterile procedures observed: Yes Anesthetic used (type and amt): None Blade size: 11 Drainage: Small amount of purulent drainage unable to quantify amount.  Complexity: Complex Packing used: No Site anesthetized, incision made over site, wound drained and explored loculations, rinsed with copious amounts of normal saline, wound packed with sterile gauze, covered with dry, sterile dressing.  Pt tolerated procedure well without complications.  Instructions for care discussed verbally and pt provided with additional written instructions for homecare and f/u.  Labs Review Labs Reviewed  BASIC METABOLIC PANEL - Abnormal; Notable for the following:    Glucose, Bld 314 (*)    All other components within normal limits  CBC - Abnormal; Notable for the following:    MCHC 37.0 (*)    All other components within normal limits  CBG MONITORING, ED - Abnormal; Notable for the following:    Glucose-Capillary 327 (*)    All other components within normal limits  URINALYSIS, ROUTINE W REFLEX MICROSCOPIC (NOT AT [facility])    Imaging Review No results found. I have personally reviewed and evaluated these images and lab results as part of my medical decision-making.   EKG Interpretation None      MDM   Final diagnoses:  Dental abscess  Essential hypertension  Hyperglycemia    Labs: BMP  CBC, CBC-glucose 314  Imaging:  Consults:  Therapeutics:  Discharge Meds:   Assessment/Plan: Patient presents with numerous complaints. He reports dental pain and abscess, I&D performed with purulent drainage, he is placed on oral antibiotics, encouraged to follow up with dentist for further evaluation and management. Patient also has hyperglycemia, he was seen several days prior for the same, patient continues to not test his blood sugar and maintaining low intake and insulin. He is instructed to take his testing supplies to the pharmacy to receive the proper testing strips, uses sliding scale insulin as directed. Patient's glucose was 314 today, no signs of DKA patient instructed to take his insulin as directed tonight, contact his primary care provider follow-up for reevaluation this week area patient also has hypertension, reports that he has not been taking his blood pressure medication. Patient was seen in the ED with hypertension several days ago, prescribed his fosinopril, patient is again instructed to take his blood pressure medication. Patient is noncompliant to any of  his medications, I counseled them on the risks of not taking them. Patient was stable here in the ED vital signs are reassuring, he is discharged home with strict return precautions.     I personally performed the services described in this documentation, which was scribed in my presence. The recorded information has been reviewed and is accurate.     Brandon Mechanic, PA-C 04/03/15 1707  Brandon Hutching, MD 04/04/15 [phone]

## 2015-05-18 HISTORY — PX: INCISION AND DRAINAGE ABSCESS: SHX5864

## 2015-05-21 ENCOUNTER — Emergency Department (HOSPITAL_COMMUNITY)
Admission: EM | Admit: 2015-05-21 | Discharge: 2015-05-21 | Disposition: A | Discharge status: Home / Self Care | Payer: Self-pay | Attending: Emergency Medicine | Admitting: Emergency Medicine

## 2015-05-21 ENCOUNTER — Encounter (HOSPITAL_COMMUNITY): Payer: Self-pay | Admitting: Emergency Medicine

## 2015-05-21 DIAGNOSIS — Z794 Long term (current) use of insulin: Secondary | ICD-10-CM | POA: Insufficient documentation

## 2015-05-21 DIAGNOSIS — I10 Essential (primary) hypertension: Secondary | ICD-10-CM | POA: Insufficient documentation

## 2015-05-21 DIAGNOSIS — Z792 Long term (current) use of antibiotics: Secondary | ICD-10-CM | POA: Insufficient documentation

## 2015-05-21 DIAGNOSIS — Z880 Allergy status to penicillin: Secondary | ICD-10-CM | POA: Insufficient documentation

## 2015-05-21 DIAGNOSIS — Z79899 Other long term (current) drug therapy: Secondary | ICD-10-CM | POA: Insufficient documentation

## 2015-05-21 DIAGNOSIS — F329 Major depressive disorder, single episode, unspecified: Secondary | ICD-10-CM | POA: Insufficient documentation

## 2015-05-21 DIAGNOSIS — Z791 Long term (current) use of non-steroidal anti-inflammatories (NSAID): Secondary | ICD-10-CM | POA: Insufficient documentation

## 2015-05-21 DIAGNOSIS — L0291 Cutaneous abscess, unspecified: Secondary | ICD-10-CM

## 2015-05-21 DIAGNOSIS — L0231 Cutaneous abscess of buttock: Secondary | ICD-10-CM | POA: Insufficient documentation

## 2015-05-21 DIAGNOSIS — Z720 Tobacco use: Secondary | ICD-10-CM | POA: Insufficient documentation

## 2015-05-21 DIAGNOSIS — E119 Type 2 diabetes mellitus without complications: Secondary | ICD-10-CM | POA: Insufficient documentation

## 2015-05-21 MED ORDER — LIDOCAINE HCL (PF) 1 % IJ SOLN: 10.0000 mL | Freq: Once | INTRAMUSCULAR | Status: DC

## 2015-05-21 MED ORDER — DOXYCYCLINE HYCLATE 100 MG PO CAPS: 100.0000 mg | ORAL_CAPSULE | Freq: Two times a day (BID) | ORAL | Status: DC

## 2015-05-21 MED ORDER — OXYCODONE-ACETAMINOPHEN 5-325 MG PO TABS: 2.0000 | ORAL_TABLET | ORAL | Status: DC | PRN

## 2015-05-21 MED ORDER — OXYCODONE-ACETAMINOPHEN 5-325 MG PO TABS
2.0000 | ORAL_TABLET | Freq: Once | ORAL | Status: AC
  Administered 2015-05-21: 2 via ORAL
  Filled 2015-05-21: qty 2

## 2015-05-21 MED ORDER — LIDOCAINE HCL 1 % IJ SOLN
INTRAMUSCULAR | Status: AC
  Administered 2015-05-21: 20 mL
  Filled 2015-05-21: qty 20

## 2015-05-21 NOTE — ED Notes (Signed)
AVS explained in detail. Knows to take entire Doxycycline regimen. Knows to keep wound clean and dry and to follow up with [facility] for [facility]CP follow up and wound re-check. Pt is ambulatory with steady gait. No other c/c.

## 2015-05-21 NOTE — ED Provider Notes (Signed)
CSN: [id number]     Arrival date & time 05/21/15  1601 History  By signing my name below, I, Brandon Mcneil, attest that this documentation has been prepared under the direction and in the presence of [name], PA-C Electronically Signed: Jarvis Mcneil, ED Scribe. 05/21/2015. 5:44 PM.  By signing my name below, I, Brandon Mcneil, attest that this documentation has been prepared under the direction and in the presence of [name], [place].  Electronically Signed: Gwenyth Mcneil, ED Scribe. 05/21/2015. 7:08 PM.  Chief Complaint  Patient presents with  . Abscess   The history is provided by the patient. No language interpreter was used.     HPI Comments: Brandon Mcneil is a 34 y.o. male with a PMH of DM and HTN who presents to the Emergency Department complaining of a raised, red, lesion to his left buttock, which he noticed 1 week ago. He reports constant pain to his left buttock and subjective fever. He denies drainage. He states that applied pressure and movement exacerbates his pain. He has not tried anything for symptom relief. He notes he had an abscess to the same area 4 months ago, which was drained. He states he was unable to fill his antibiotic at that time due to financial issues. He denies dizziness, lightheadedness, chest pain, SOB, urinary symptoms, abdominal pain, nausea, vomiting or diarrhea. He reports fluctuating blood sugar over the last several days because of his eating habits. He states that he drinks 40 oz of beer daily and a case of beer every weekend. He reports he smokes cigarettes.    Past Medical History  Diagnosis Date  . Diabetes (HCC)   . Depression   . Hypertension   . Hypercholesteremia    History reviewed. No pertinent past surgical history. No family history on file. Social History  Substance Use Topics  . Smoking status: Current Every Day Smoker -- 0.50 packs/day    Types: Cigarettes  . Smokeless tobacco: None  . Alcohol  Use: Yes     Comment: occasional     Review of Systems  Constitutional: Positive for fever (subjective). Negative for chills.  Respiratory: Negative for shortness of breath.   Cardiovascular: Negative for chest pain.  Gastrointestinal: Negative for nausea, vomiting, abdominal pain and diarrhea.  Genitourinary: Negative for dysuria, urgency, frequency, hematuria and difficulty urinating.  Skin: Positive for color change.  Neurological: Negative for dizziness, weakness, light-headedness, numbness and headaches.  All other systems reviewed and are negative.  Allergies  Penicillins  Home Medications   Prior to Admission medications   Medication Sig Start Date End Date Taking? Authorizing Provider  cephALEXin (KEFLEX) 500 MG capsule Take 1 capsule (500 mg total) by mouth 4 (four) times daily. 03/22/15   Emilia Beck, PA-C  clindamycin (CLEOCIN) 150 MG capsule Take 3 capsules (450 mg total) by mouth 3 (three) times daily. 04/03/15   [name], PA-C  FLUoxetine (PROZAC) 20 MG capsule Take 1 capsule (20 mg total) by mouth daily. RESUME THIS AS YOU WERE TAKING AT HOME Patient not taking: Reported on 03/22/2015 09/14/13   [name], NP  glucose blood test strip Use as instructed 03/22/15   Emilia Beck, PA-C  HYDROcodone-acetaminophen (NORCO/VICODIN) 5-325 MG per tablet Take 1-2 tablets by mouth every 6 (six) hours as needed for moderate pain or severe pain. Patient not taking: Reported on 03/22/2015 08/03/14   [name], PA-C  ibuprofen (ADVIL,MOTRIN) 800 MG tablet Take 1 tablet (800 mg total) by mouth 3 (three) times  daily. 03/22/15   Emilia Beck, PA-C  insulin NPH-regular Human (RELION 70/30) (70-30) 100 UNIT/ML injection Inject 40-50 Units into the skin 2 (two) times daily with a meal. 01/31/14   [name], PA-C  lisinopril (PRINIVIL,ZESTRIL) 10 MG tablet Take 1 tablet (10 mg total) by mouth daily. 03/22/15   Emilia Beck, PA-C  lurasidone 20 MG TABS Take 1 tablet (20  mg total) by mouth daily with breakfast. Patient not taking: Reported on 03/22/2015 09/14/13   [name], NP  oxyCODONE-acetaminophen (PERCOCET/ROXICET) 5-325 MG per tablet Take 1 tablet by mouth every 8 (eight) hours as needed for severe pain. Patient not taking: Reported on 03/22/2015 12/27/14   [name], NP   Triage Vitals: BP 178/100 mmHg  Pulse 96  Temp(Src) 98.5 F (36.9 C) (Oral)  Resp 18  SpO2 98%  Physical Exam  Constitutional: He is oriented to person, place, and time. He appears well-developed and well-nourished. No distress.  HENT:  Head: Normocephalic and atraumatic.  Right Ear: Tympanic membrane and external ear normal.  Left Ear: Tympanic membrane and external ear normal.  Nose: Nose normal.  Mouth/Throat: Uvula is midline and oropharynx is clear and moist.  Eyes: Conjunctivae and EOM are normal. Pupils are equal, round, and reactive to light. Right eye exhibits no discharge. Left eye exhibits no discharge. No scleral icterus.  Neck: Normal range of motion. Neck supple.  Cardiovascular: Normal rate, regular rhythm, normal heart sounds and intact distal pulses.   Pulmonary/Chest: Effort normal and breath sounds normal. No respiratory distress. He has no wheezes. He has no rales.  Abdominal: Soft. Bowel sounds are normal. He exhibits no distension and no mass. There is no tenderness. There is no rebound and no guarding.  Genitourinary:     Musculoskeletal: Normal range of motion. He exhibits no edema or tenderness.  Neurological: He is alert and oriented to person, place, and time. He has normal strength. No sensory deficit.  Skin: Skin is warm and dry. No rash noted. There is erythema. No pallor.  3 x 3 cm abscess to left buttock with mild erythema and surrounding induration.   Psychiatric: He has a normal mood and affect. His behavior is normal. Judgment and thought content normal.  Nursing note and vitals reviewed.   ED Course  INCISION AND  DRAINAGE Date/Time: 05/22/2015 5:21 PM Performed by: [name] C Authorized by: [name] C Consent: Verbal consent obtained. Risks and benefits: risks, benefits and alternatives were discussed Consent given by: patient Patient understanding: patient states understanding of the procedure being performed Patient consent: the patient's understanding of the procedure matches consent given Procedure consent: procedure consent matches procedure scheduled Relevant documents: relevant documents present and verified Site marked: the operative site was marked Required items: required blood products, implants, devices, and special equipment available Patient identity confirmed: verbally with patient Type: abscess Location: Left buttock. Anesthesia: local infiltration Local anesthetic: lidocaine 1% without epinephrine Anesthetic total: 6 ml Patient sedated: no Scalpel size: 11 Needle gauge: 22 Incision type: single straight Incision depth: dermal Complexity: simple Drainage: purulent Drainage amount: moderate Wound treatment: wound left open Packing material: none Patient tolerance: Patient tolerated the procedure well with no immediate complications Comments: Chaperone (scribe) was present for exam which was performed with no discomfort or complications.    (including critical care time)  DIAGNOSTIC STUDIES: Oxygen Saturation is 98% on RA, normal by my interpretation.    COORDINATION OF CARE: 5:58 PM- Will order Percocet 5-325mg  tablet. Pt advised of plan  for treatment and pt agrees.    MDM   Final diagnoses:  Abscess    34 year old male presents to the ED with left buttock abscess x 1 week. He reports gradually worsening pain and swelling. He denies drainage. He reports subjective fever. He states he had an abscess in the same area 4 months ago, which was drained in the ED.  Patient is afebrile. Vital signs stable. Heart RRR. Lungs clear to auscultation  bilaterally. Abdomen soft, nontender, nondistended with no rebound, guarding, or masses. 3 x 3 cm abscess to left medial buttock with mild erythema and surrounding induration.   Incision and drainage performed in the ED, which the patient tolerated well. Pain controlled. Wound dressed. Will treat with doxycycline given erythema and induration (patient states he is allergic to PCN). Patient to keep wound clean and dry. Material to change dressing given. Discussed importance of picking up antibiotic and taking full course. Patient to follow-up for wound re-check in 2 days. Return precautions discussed at length. Patient understands and is in agreement with plan.   I personally performed the services described in this documentation, which was scribed in my presence. The recorded information has been reviewed and is accurate.   BP 165/100 mmHg  Pulse 88  Temp(Src) 98.5 F (36.9 C) (Oral)  Resp 17  SpO2 98%     [name], PA-C 05/22/15 1726  Lorre Nick, MD 05/23/15 [phone]

## 2015-05-21 NOTE — ED Notes (Signed)
Per pt ,states boil on left buttock-had it lanced 4 months ago

## 2015-05-21 NOTE — Discharge Instructions (Signed)
1. Medications: keflex, pain medicine, usual home medications 2. Treatment: rest, drink plenty of fluids, keep wound clean and dry, change dressings daily 3. Follow Up: please followup in 2-3 days for wound re-check; if you do not have a primary care doctor use the resource guide provided to find one; please return to the ER for high fever, persistent vomiting, increased swelling and drainage, new or worsening symptoms   Abscess An abscess (boil or furuncle) is an infected area on or under the skin. This area is filled with yellowish-white fluid (pus) and other material (debris). HOME CARE   Only take medicines as told by your doctor.  If you were given antibiotic medicine, take it as directed. Finish the medicine even if you start to feel better.  If gauze is used, follow your doctor's directions for changing the gauze.  To avoid spreading the infection:  Keep your abscess covered with a bandage.  Wash your hands well.  Do not share personal care items, towels, or whirlpools with others.  Avoid skin contact with others.  Keep your skin and clothes clean around the abscess.  Keep all doctor visits as told. GET HELP RIGHT AWAY IF:   You have more pain, puffiness (swelling), or redness in the wound site.  You have more fluid or blood coming from the wound site.  You have muscle aches, chills, or you feel sick.  You have a fever. MAKE SURE YOU:   Understand these instructions.  Will watch your condition.  Will get help right away if you are not doing well or get worse.   This information is not intended to replace advice given to you by your health care provider. Make sure you discuss any questions you have with your health care provider.   Document Released: 01/20/2008 Document Revised: 02/02/2012 Document Reviewed: 10/17/2011 Elsevier Interactive Patient Education 2016 Elsevier Inc.  Abscess Care After An abscess (also called a boil or furuncle) is an infected area  that contains a collection of pus. Signs and symptoms of an abscess include pain, tenderness, redness, or hardness, or you may feel a moveable soft area under your skin. An abscess can occur anywhere in the body. The infection may spread to surrounding tissues causing cellulitis. A cut (incision) by the surgeon was made over your abscess and the pus was drained out. Gauze may have been packed into the space to provide a drain that will allow the cavity to heal from the inside outwards. The boil may be painful for 5 to 7 days. Most people with a boil do not have high fevers. Your abscess, if seen early, may not have localized, and may not have been lanced. If not, another appointment may be required for this if it does not get better on its own or with medications. HOME CARE INSTRUCTIONS   Only take over-the-counter or prescription medicines for pain, discomfort, or fever as directed by your caregiver.  When you bathe, soak and then remove gauze or iodoform packs at least daily or as directed by your caregiver. You may then wash the wound gently with mild soapy water. Repack with gauze or do as your caregiver directs. SEEK IMMEDIATE MEDICAL CARE IF:   You develop increased pain, swelling, redness, drainage, or bleeding in the wound site.  You develop signs of generalized infection including muscle aches, chills, fever, or a general ill feeling.  An oral temperature above 102 F (38.9 C) develops, not controlled by medication. See your caregiver for a recheck if  you develop any of the symptoms described above. If medications (antibiotics) were prescribed, take them as directed. Document Released: 02/19/2005 Document Revised: 10/26/2011 Document Reviewed: 10/17/2007 [facility] Patient Information 2015 [place], [place]. This information is not intended to replace advice given to you by your health care provider. Make sure you discuss any questions you have with your health care provider.   Emergency  Department Resource Guide 1) Find a Doctor and Pay Out of Pocket Although you won't have to find out who is covered by your insurance plan, it is a good idea to ask around and get recommendations. You will then need to call the office and see if the doctor you have chosen will accept you as a new patient and what types of options they offer for patients who are self-pay. Some doctors offer discounts or will set up payment plans for their patients who do not have insurance, but you will need to ask so you aren't surprised when you get to your appointment.  2) Contact Your Local Health Department Not all health departments have doctors that can see patients for sick visits, but many do, so it is worth a call to see if yours does. If you don't know where your local health department is, you can check in your phone book. The CDC also has a tool to help you locate your state's health department, and many state websites also have listings of all of their local health departments.  3) Find a Walk-in Clinic If your illness is not likely to be very severe or complicated, you may want to try a walk in clinic. These are popping up all over the country in pharmacies, drugstores, and shopping centers. They're usually staffed by nurse practitioners or physician assistants that have been trained to treat common illnesses and complaints. They're usually fairly quick and inexpensive. However, if you have serious medical issues or chronic medical problems, these are probably not your best option.  No Primary Care Doctor: - Call Health Connect at  [phone] - they can help you locate a primary care doctor that  accepts your insurance, provides certain services, etc. - Physician Referral Service- [phone]  Chronic Pain Problems: Organization         Address  Phone   Notes  [facility]  [phone] Patients need to be referred by their primary care doctor.   Medication  Assistance: Organization         Address  Phone   Notes  [facility] Medication [facility] [address] [place], [place] [address] [phone] --Must be a resident of [facility] -- Must have NO insurance coverage whatsoever (no Medicaid/ Medicare, etc.) -- The pt. MUST have a primary care doctor that directs their care regularly and follows them in the community   [facility]ist  [phone]   [employer]  [phone]    Agencies that provide inexpensive medical care: Organization         Address  Phone   Notes  [facility]  [phone]   [facility]    [phone]   [employer] [address] [place], [place] [address] [phone]   [facility] [address] [place] [phone]   Planned Parenthood    [phone]   [facility]    [phone]   [facility]  [address] [place], [employer] Phone:  [phone], Fax:  [phone] Hours of Operation:  9 am - 6 pm, M-F.  Also accepts Medicaid/Medicare and self-pay.  [facility]  [address], [place] Phone: [phone], Fax: [phone]. Hours of Operation:  8:30 am - 5:30 pm, M-F.  Also accepts Medicaid and self-pay.  [facility] [place] [address], [place] Phone: [phone]   [facility] [address] Natasha Bence [place], [place] [phone], Ext. [phone] Mondays & Thursdays: 7-9 AM.  First 15 patients are seen on a first come, first serve basis.    Medicaid-accepting [facility] Providers:  Organization         Address  Phone   Notes  [facility] [address], [place] [phone] Also accepts self-pay patients.  [facility] [address] Laurell Josephs [place], [place]  [phone]   [facility] [address], [place]  [phone]   [facility] [address], [place] [phone]   Renaye Rakers [address], [place]   [phone] Only accepts [place] Access [place] patients after they have their name applied to their card.   Self-Pay (no insurance) in [facility]:  Organization         Address  Phone   Notes  Sickle Cell Patients, [facility] [address] [place], [place] [phone]   [facility] [address] [place], [place] [phone]   [facility] [place]  [address], [place] [phone]   [facility]/Dr. Osei-Bonsu  [address] [place] or [address], [place] [phone] Phone number for both [place] and [place] locations is the same.  [facility] [address] [place] [phone]   [facility] [address] [place] or [address] [phone]   [facility] [address] [place] [phone], phone; [phone], fax Sees patients 1st and 3rd Saturday of every month.  Must not qualify for public or private insurance (i.e. Medicaid, Medicare, [place] Health Choice, Veterans' Benefits)  Household income should be no more than 200% of the poverty level The clinic cannot treat you if you are pregnant or think you are pregnant  Sexually transmitted diseases are not treated at the clinic.    Dental Care: Organization         Address  Phone  Notes  [facility] Department of [facility] [address] [place], [place] [phone] Accepts children up to age 46 who are enrolled in [place] or [place] Health Choice; pregnant women with a Medicaid card; and children who have applied for Medicaid or [place] Health Choice, but were declined, whose parents can pay a reduced fee at time of service.  [facility]  Department of [facility]  [address], [place] [phone] Accepts children up to age 58 who are enrolled in [place] or [place] Health Choice; pregnant women with a Medicaid card; and children who have applied for Medicaid or [place] Health Choice, but were declined, whose parents can pay a reduced fee at time of service.  [facility]  [address] [place], [place] [phone] Patients are seen by appointment only. Walk-ins  are not accepted. [facility] will see patients 20 years of age and older. Monday - Tuesday (8am-5pm) Most Wednesdays (8:30-5pm) $30 per visit, cash only  [facility]  [address], [facility] [phone] Patients are seen by appointment only. Walk-ins are not accepted. [facility] will see patients 59 years of age and older. One Wednesday Evening (Monthly: Volunteer Based).  $30 per visit, cash only  [employer]  [phone] for adults; Children under age 71, call Graduate Pediatric Dentistry at [phone]. Children aged 109-14, please call [phone] to request a pediatric application.  Dental services are provided in all areas of dental care including fillings, crowns and bridges, complete and partial dentures, implants, gum treatment, root canals, and extractions. Preventive care is also provided. Treatment is provided to both adults and children. Patients are selected via a lottery and there is often a waiting list.   [facility] [address] [place]  [phone] [email or web address]   [facility] [address] [place], [place] [phone], Ext. [phone] Second and Fourth Thursday of each month, opens at 6:30 AM; Clinic ends at 9 AM.  Patients are seen on a first-come first-served basis, and a limited number are seen during each clinic.   [facility]  [address] Ether Griffins [place], [place] [phone]    Eligibility Requirements You must have lived in [place], [place], or [place] counties for at least the last three months.   You cannot be eligible for state or federal sponsored [place], including [employer], [place], or [employer].   You generally cannot be eligible for healthcare insurance through your employer.    How to apply: Eligibility screenings are held every Tuesday and Wednesday afternoon from 1:00 pm until 4:00 pm. You do not need an appointment for the interview!  [facility] [address], [place], [place] [phone]   [facility]  [phone]   [facility] Health Department  [phone]   [facility]  [phone]    Behavioral Health Resources in the Community: Intensive Outpatient Programs Organization         Address  Phone  Notes  [facility] [address] [place], [place] [phone]   [facility] Outpatient [address], [place], [place] [phone]   [facility] [address] [place], [place]  [phone]   [facility] Mental Health [address],  [place], [place] [phone] or [phone]   Substance Abuse Resources Organization         Address  Phone  Notes  [facility]  [phone]   [facility]  [phone]   [facility]  [phone]   Floydene Flock  [phone]   Residential & Outpatient Substance Abuse Program  [phone]   Psychological Services Organization         Address  Phone  Notes  [facility] Behavioral Health  [phone]   [facility] Services  [phone]   [facility] [address] [place] [phone] or [phone]    Mobile Crisis Teams Organization         Address  Phone  Notes  [facility], Mobile Crisis Care Unit  [phone]   [facility]  [address]  [place], [place] [phone]   [facility] [address] [place] [phone]  Self-Help/Support Groups Organization         Address  Phone             Notes  [facility] [place] - variety of support groups  336- I7437963 Call for more information  Narcotics Anonymous (NA), [facility] [address], [employer] [place]  2 meetings at this location   Statistician         Address  Phone  Notes  [facility] [address],    [place]  [phone]   [facility]  [address] [place] [address], [place], [place] [phone]   [facility] [address] [place], [place] [phone] Admissions: 8am-3pm M-F  [facility] [address],    [place], [place] [phone]   [facility] [address] [place], [place], [place] [phone]   [facility] [address],  [place], [place] [phone]   [facility] - Intensive Outpatient [address] Laurell Josephs [address], [place], [place] [phone]   [facility] ([facility] [address] [place]  [place], [place] [phone] or [phone]   [facility] ([facility]) [address] [place], [place] [phone] Accepts Medicaid  [facility] [place] [address]  [place] [phone] Substance Abuse/Addiction Treatment   [facility] Organization         Address  Phone  Notes  [facility]  ([phone]   Angie Fava, PhD [address] Ervin Knack [place], [place]   ([phone] or [phone]   [facility] Behavioral   [address] [place], [place] [phone]   [facility] [address], [place], [place] [phone] Insurance/Medicaid/sponsorship through [facility] and Families [address]                                    [place], [place] [phone] Therapy/tele-psych/case    [facility] [address], [place] [phone]    Dr. Lolly Mustache  [phone]   [facility]  [employer] [facility] Dept. 1) [address], [place] 2) [address] [place], [place] 3)  [address], [place] [phone]  [phone]   [facility] Child Abuse Hotline [phone]) [phone] or [phone] (After Hours)

## 2015-06-09 ENCOUNTER — Encounter (HOSPITAL_COMMUNITY): Payer: Self-pay | Admitting: Emergency Medicine

## 2015-06-09 ENCOUNTER — Emergency Department (HOSPITAL_COMMUNITY)
Admission: EM | Admit: 2015-06-09 | Discharge: 2015-06-09 | Disposition: A | Discharge status: Home / Self Care | Payer: Self-pay | Attending: Emergency Medicine | Admitting: Emergency Medicine

## 2015-06-09 DIAGNOSIS — F17210 Nicotine dependence, cigarettes, uncomplicated: Secondary | ICD-10-CM | POA: Insufficient documentation

## 2015-06-09 DIAGNOSIS — Z791 Long term (current) use of non-steroidal anti-inflammatories (NSAID): Secondary | ICD-10-CM | POA: Insufficient documentation

## 2015-06-09 DIAGNOSIS — Z880 Allergy status to penicillin: Secondary | ICD-10-CM | POA: Insufficient documentation

## 2015-06-09 DIAGNOSIS — F329 Major depressive disorder, single episode, unspecified: Secondary | ICD-10-CM | POA: Insufficient documentation

## 2015-06-09 DIAGNOSIS — Z794 Long term (current) use of insulin: Secondary | ICD-10-CM | POA: Insufficient documentation

## 2015-06-09 DIAGNOSIS — J069 Acute upper respiratory infection, unspecified: Secondary | ICD-10-CM

## 2015-06-09 DIAGNOSIS — K0889 Other specified disorders of teeth and supporting structures: Secondary | ICD-10-CM

## 2015-06-09 DIAGNOSIS — I10 Essential (primary) hypertension: Secondary | ICD-10-CM

## 2015-06-09 DIAGNOSIS — Z792 Long term (current) use of antibiotics: Secondary | ICD-10-CM | POA: Insufficient documentation

## 2015-06-09 DIAGNOSIS — Z79899 Other long term (current) drug therapy: Secondary | ICD-10-CM | POA: Insufficient documentation

## 2015-06-09 DIAGNOSIS — Z760 Encounter for issue of repeat prescription: Secondary | ICD-10-CM | POA: Insufficient documentation

## 2015-06-09 DIAGNOSIS — E119 Type 2 diabetes mellitus without complications: Secondary | ICD-10-CM | POA: Insufficient documentation

## 2015-06-09 DIAGNOSIS — Z4801 Encounter for change or removal of surgical wound dressing: Secondary | ICD-10-CM | POA: Insufficient documentation

## 2015-06-09 MED ORDER — CEPHALEXIN 500 MG PO CAPS: 500.0000 mg | ORAL_CAPSULE | Freq: Four times a day (QID) | ORAL | Status: DC

## 2015-06-09 MED ORDER — BENZONATATE 100 MG PO CAPS: 100.0000 mg | ORAL_CAPSULE | Freq: Three times a day (TID) | ORAL | Status: DC

## 2015-06-09 MED ORDER — HYDROCODONE-ACETAMINOPHEN 5-325 MG PO TABS: 1.0000 | ORAL_TABLET | Freq: Four times a day (QID) | ORAL | Status: DC | PRN

## 2015-06-09 MED ORDER — LISINOPRIL 10 MG PO TABS: 10.0000 mg | ORAL_TABLET | Freq: Every day | ORAL | Status: DC

## 2015-06-09 NOTE — Discharge Instructions (Signed)
Cough, Adult Coughing is a reflex that clears your throat and your airways. Coughing helps to heal and protect your lungs. It is normal to cough occasionally, but a cough that happens with other symptoms or lasts a long time may be a sign of a condition that needs treatment. A cough may last only 2-3 weeks (acute), or it may last longer than 8 weeks (chronic). CAUSES Coughing is commonly caused by:  Breathing in substances that irritate your lungs.  A viral or bacterial respiratory infection.  Allergies.  Asthma.  Postnasal drip.  Smoking.  Acid backing up from the stomach into the esophagus (gastroesophageal reflux).  Certain medicines.  Chronic lung problems, including COPD (or rarely, lung cancer).  Other medical conditions such as heart failure. HOME CARE INSTRUCTIONS  Pay attention to any changes in your symptoms. Take these actions to help with your discomfort:  Take medicines only as told by your health care provider.  If you were prescribed an antibiotic medicine, take it as told by your health care provider. Do not stop taking the antibiotic even if you start to feel better.  Talk with your health care provider before you take a cough suppressant medicine.  Drink enough fluid to keep your urine clear or pale yellow.  If the air is dry, use a cold steam vaporizer or humidifier in your bedroom or your home to help loosen secretions.  Avoid anything that causes you to cough at work or at home.  If your cough is worse at night, try sleeping in a semi-upright position.  Avoid cigarette smoke. If you smoke, quit smoking. If you need help quitting, ask your health care provider.  Avoid caffeine.  Avoid alcohol.  Rest as needed. SEEK MEDICAL CARE IF:   You have new symptoms.  You cough up pus.  Your cough does not get better after 2-3 weeks, or your cough gets worse.  You cannot control your cough with suppressant medicines and you are losing sleep.  You  develop pain that is getting worse or pain that is not controlled with pain medicines.  You have a fever.  You have unexplained weight loss.  You have night sweats. SEEK IMMEDIATE MEDICAL CARE IF:  You cough up blood.  You have difficulty breathing.  Your heartbeat is very fast.   This information is not intended to replace advice given to you by your health care provider. Make sure you discuss any questions you have with your health care provider.   Document Released: 01/30/2011 Document Revised: 04/24/2015 Document Reviewed: 10/10/2014 Elsevier Interactive Patient Education 2016 [employer]. Hypertension Hypertension, commonly called high blood pressure, is when the force of blood pumping through your arteries is too strong. Your arteries are the blood vessels that carry blood from your heart throughout your body. A blood pressure reading consists of a higher number over a lower number, such as 110/72. The higher number (systolic) is the pressure inside your arteries when your heart pumps. The lower number (diastolic) is the pressure inside your arteries when your heart relaxes. Ideally you want your blood pressure below 120/80. Hypertension forces your heart to work harder to pump blood. Your arteries may become narrow or stiff. Having untreated or uncontrolled hypertension can cause heart attack, stroke, kidney disease, and other problems. RISK FACTORS Some risk factors for high blood pressure are controllable. Others are not.  Risk factors you cannot control include:   Race. You may be at higher risk if you are African American.  Age.  Risk increases with age.  Gender. Men are at higher risk than women before age 36 years. After age 36, women are at higher risk than men. Risk factors you can control include:  Not getting enough exercise or physical activity.  Being overweight.  Getting too much fat, sugar, calories, or salt in your diet.  Drinking too much alcohol. SIGNS  AND SYMPTOMS Hypertension does not usually cause signs or symptoms. Extremely high blood pressure (hypertensive crisis) may cause headache, anxiety, shortness of breath, and nosebleed. DIAGNOSIS To check if you have hypertension, your health care provider will measure your blood pressure while you are seated, with your arm held at the level of your heart. It should be measured at least twice using the same arm. Certain conditions can cause a difference in blood pressure between your right and left arms. A blood pressure reading that is higher than normal on one occasion does not mean that you need treatment. If it is not clear whether you have high blood pressure, you may be asked to return on a different day to have your blood pressure checked again. Or, you may be asked to monitor your blood pressure at home for 1 or more weeks. TREATMENT Treating high blood pressure includes making lifestyle changes and possibly taking medicine. Living a healthy lifestyle can help lower high blood pressure. You may need to change some of your habits. Lifestyle changes may include:  Following the DASH diet. This diet is high in fruits, vegetables, and whole grains. It is low in salt, red meat, and added sugars.  Keep your sodium intake below 2,300 mg per day.  Getting at least 30-45 minutes of aerobic exercise at least 4 times per week.  Losing weight if necessary.  Not smoking.  Limiting alcoholic beverages.  Learning ways to reduce stress. Your health care provider may prescribe medicine if lifestyle changes are not enough to get your blood pressure under control, and if one of the following is true:  You are 4518-36 years of age and your systolic blood pressure is above 140.  You are 36 years of age or older, and your systolic blood pressure is above 150.  Your diastolic blood pressure is above 90.  You have diabetes, and your systolic blood pressure is over 140 or your diastolic blood pressure is  over 90.  You have kidney disease and your blood pressure is above 140/90.  You have heart disease and your blood pressure is above 140/90. Your personal target blood pressure may vary depending on your medical conditions, your age, and other factors. HOME CARE INSTRUCTIONS  Have your blood pressure rechecked as directed by your health care provider.   Take medicines only as directed by your health care provider. Follow the directions carefully. Blood pressure medicines must be taken as prescribed. The medicine does not work as well when you skip doses. Skipping doses also puts you at risk for problems.  Do not smoke.   Monitor your blood pressure at home as directed by your health care provider. SEEK MEDICAL CARE IF:   You think you are having a reaction to medicines taken.  You have recurrent headaches or feel dizzy.  You have swelling in your ankles.  You have trouble with your vision. SEEK IMMEDIATE MEDICAL CARE IF:  You develop a severe headache or confusion.  You have unusual weakness, numbness, or feel faint.  You have severe chest or abdominal pain.  You vomit repeatedly.  You have trouble breathing. MAKE SURE  YOU:   Understand these instructions.  Will watch your condition.  Will get help right away if you are not doing well or get worse.   This information is not intended to replace advice given to you by your health care provider. Make sure you discuss any questions you have with your health care provider.   Document Released: 08/03/2005 Document Revised: 12/18/2014 Document Reviewed: 05/26/2013 Elsevier Interactive Patient Education 2016 Elsevier Inc. Dental Pain Dental pain may be caused by many things, including:  Tooth decay (cavities or caries). Cavities expose the nerve of your tooth to air and hot or cold temperatures. This can cause pain or discomfort.  Abscess or infection. A dental abscess is a collection of infected pus from a bacterial  infection in the inner part of the tooth (pulp). It usually occurs at the end of the tooth's root.  Injury.  An unknown reason (idiopathic). Your pain may be mild or severe. It may only occur when:  You are chewing.  You are exposed to hot or cold temperature.  You are eating or drinking sugary foods or beverages, such as soda or candy. Your pain may also be constant. HOME CARE INSTRUCTIONS Watch your dental pain for any changes. The following actions may help to lessen any discomfort that you are feeling:  Take medicines only as directed by your dentist.  If you were prescribed an antibiotic medicine, finish all of it even if you start to feel better.  Keep all follow-up visits as directed by your dentist. This is important.  Do not apply heat to the outside of your face.  Rinse your mouth or gargle with salt water if directed by your dentist. This helps with pain and swelling.  You can make salt water by adding  tsp of salt to 1 cup of warm water.  Apply ice to the painful area of your face:  Put ice in a plastic bag.  Place a towel between your skin and the bag.  Leave the ice on for 20 minutes, 2-3 times per day.  Avoid foods or drinks that cause you pain, such as:  Very hot or very cold foods or drinks.  Sweet or sugary foods or drinks. SEEK MEDICAL CARE IF:  Your pain is not controlled with medicines.  Your symptoms are worse.  You have new symptoms. SEEK IMMEDIATE MEDICAL CARE IF:  You are unable to open your mouth.  You are having trouble breathing or swallowing.  Emergency Department Resource Guide 1) Find a Doctor and Pay Out of Pocket Although you won't have to find out who is covered by your insurance plan, it is a good idea to ask around and get recommendations. You will then need to call the office and see if the doctor you have chosen will accept you as a new patient and what types of options they offer for patients who are self-pay. Some  doctors offer discounts or will set up payment plans for their patients who do not have insurance, but you will need to ask so you aren't surprised when you get to your appointment.  2) Contact Your Local Health Department Not all health departments have doctors that can see patients for sick visits, but many do, so it is worth a call to see if yours does. If you don't know where your local health department is, you can check in your phone book. The CDC also has a tool to help you locate your state's health department, and many state  websites also have listings of all of their local health departments.  3) Find a Walk-in Clinic If your illness is not likely to be very severe or complicated, you may want to try a walk in clinic. These are popping up all over the country in pharmacies, drugstores, and shopping centers. They're usually staffed by nurse practitioners or physician assistants that have been trained to treat common illnesses and complaints. They're usually fairly quick and inexpensive. However, if you have serious medical issues or chronic medical problems, these are probably not your best option.  No Primary Care Doctor: - Call Health Connect at  [phone] - they can help you locate a primary care doctor that  accepts your insurance, provides certain services, etc. - Physician Referral Service- [phone]  Chronic Pain Problems: Organization         Address  Phone   Notes  [facility]  [phone] Patients need to be referred by their primary care doctor.   Medication Assistance: Organization         Address  Phone   Notes  [facility] Medication [facility] [address] [place], [place] [address] [phone] --Must be a resident of [facility] -- Must have NO insurance coverage whatsoever (no Medicaid/ Medicare, etc.) -- The pt. MUST have a primary care doctor that directs their care regularly and follows them in the  community   [facility]ist  [phone]   [employer]  [phone]    Agencies that provide inexpensive medical care: Organization         Address  Phone   Notes  [facility]  [phone]   [facility]    [phone]   [facility] [address] [place], [place] [address] [phone]   [facility] [address], [place] [phone]   Planned Parenthood    [phone]   [facility]    [phone]   Community Health and [facility]  [address], [place] Phone:  [phone], Fax:  [phone] Hours of Operation:  9 am - 6 pm, M-F.  Also accepts Medicaid/Medicare and self-pay.  [facility]  [address], [place] Phone: [phone], Fax: [phone]. Hours of Operation:  8:30 am - 5:30 pm, M-F.  Also accepts Medicaid and self-pay.  [facility] [place] [address] [place] Phone: [phone]   [facility] [address] Natasha Bence [place], [place] [phone], Ext. [phone] Mondays & Thursdays: 7-9 AM.  First 15 patients are seen on a first come, first serve basis.    Medicaid-accepting [facility] Providers:  Organization         Address  Phone   Notes  [facility] [address],  [phone] Also accepts self-pay patients.  [facility] [address] Laurell Josephs [place], [place]  [phone]   [facility] [address], [place] [phone]   [facility] [address] [place] [phone]   Renaye Rakers [address], [place]   [phone] Only accepts [place] Access [place] patients after they have their name applied to their card.   Self-Pay (no insurance) in [facility]:  [employer]  Notes  Sickle Cell Patients, [facility]  Internal Medicine [address] [place], [place] [phone]   [facility] [address] [place], [place] [phone]   [facility] [place]  [address], [place] [phone]   [facility]/Dr. Osei-Bonsu  [address] [place] or [address], [place] [phone] Phone number for both [place] and [place] locations is the same.  [facility] [address] [place] [phone]   [facility] [address], [place] or [address] [phone]   [facility] [address], [place] [phone], phone; [phone], fax Sees patients 1st and 3rd Saturday of every month.  Must not qualify for public or private insurance (i.e. Medicaid, Medicare, [place] Health Choice, Veterans' Benefits)  Household income should be no more than 200% of the poverty level The clinic cannot treat you if you are pregnant or think you are pregnant  Sexually transmitted diseases are not treated at the clinic.    Dental Care: Organization         Address  Phone  Notes  [facility] Department of [facility] [address] [place], [place] [phone] Accepts children up to age 72 who are enrolled in [place] or [place] Health Choice; pregnant women with a Medicaid card; and children who have applied for Medicaid or [place] Health Choice, but were declined, whose parents can pay a reduced fee at time of service.  [facility] Department of [facility]  [address], [place] [phone] Accepts children up to age 59 who are enrolled in [place] or [place] Health Choice; pregnant women with a Medicaid card; and children who have applied for Medicaid or [place] Health Choice, but were declined, whose parents can pay a reduced fee at time of service.  [facility]  [address] [place], [place] [phone] Patients are seen by appointment only. Walk-ins are not accepted. [facility] will see patients 90 years of age and older. Monday - Tuesday (8am-5pm) Most Wednesdays (8:30-5pm) $30 per visit, cash only  [facility] Dental Access PRO[facility]  [address], [facility] [phone] Patients are seen by appointment only. Walk-ins are not accepted. [facility] will see patients 71 years of age and older. One Wednesday Evening (Monthly: Volunteer Based).  $30 per visit, cash only  [employer]  [phone] for adults; Children under age 17, call Graduate Pediatric Dentistry at [phone]. Children aged 46-14, please call [phone] to request a pediatric application.  Dental services are provided in all areas of dental care including fillings, crowns and bridges, complete and partial dentures, implants, gum treatment, root canals, and extractions. Preventive care is also provided. Treatment is provided to both adults and children. Patients are selected via a lottery and there is often a waiting list.   [facility] [address], [place]  [phone] [email or web address]   [facility] [address] [place], [place] [phone], Ext. [phone] Second and Fourth Thursday of each month, opens at 6:30 AM; Clinic ends at 9 AM.  Patients are seen on a first-come first-served basis, and a limited number are seen during each clinic.   [facility]  [address] Ether Griffins [place], [place] [phone]  Eligibility Requirements You must have lived in [place], [place], or [place] counties for at least the last three months.   You cannot be eligible for state or federal sponsored [place], including [employer], [place], or [employer].   You generally cannot be eligible for healthcare insurance through your employer.    How to apply: Eligibility screenings are held every  Tuesday and Wednesday afternoon from 1:00 pm until 4:00 pm. You do not need an appointment for the interview!  [facility] [address], [place], [place] [phone]   [facility] Health Department  [phone]   [facility] Health Department  [phone]   [facility] Health Department  [phone]    Behavioral Health Resources in the Community: Intensive Outpatient Programs Organization         Address  Phone  Notes  [facility] Services [address] [place], [place] [phone]   [facility] Outpatient [address] [place], [place] [phone]   [facility] [address] [place], [place]  [phone]   [facility] Mental Health [address]  [place], [place] [phone] or [phone]   Substance Abuse Resources Organization         Address  Phone  Notes  [facility] Drug Services  [phone]   [facility]  [phone]   [facility]  [phone]   Floydene Flock  [phone]   Residential & Outpatient Substance Abuse Program  [phone]   Psychological Services Organization         Address  Phone  Notes  [facility]  [phone]   [facility]  [phone]   [facility] Mental Health [address], [place] [phone] or [phone]    Mobile Crisis Teams Organization         Address  Phone  Notes  [facility], Mobile Crisis Care Unit  [phone]   [facility]  [address] [place], [place] [phone]   Doristine Locks [address] [place] [phone]    Self-Help/Support Groups Organization         Address  Phone             Notes  [facility] [place] - variety of support groups  336- I7437963 Call for more information  Narcotics Anonymous (NA), [facility] [address], [employer] [place]  2 meetings at this location    Statistician         Address  Phone  Notes  [facility] [address],    [place]  [phone]   [facility]  [address], [place] [phone], [place], [place] [phone]   [facility] [address] [place], [place] [phone] Admissions: 8am-3pm M-F  [facility] [address],    [place], [place] [phone]   [facility] [address] Starling Manns [place], [place] [phone]   [facility] [address],  [place], [place] [phone]   [facility] - Intensive Outpatient [address] Laurell Joseph[address], [place], [place] [phone]   [facility] (Addiction Recovery [facility] [address] [place]  [place], [place] [phone] or [phone]   [facility] ([facility]) [address] [place], [place] [phone] Accepts Medicaid  [facility] [address]  [place] [phone] Substance Abuse/Addiction Treatment   [facility] Resources Organization  Address  Phone  Notes  [facility]  [phone]   Angie Fava, PhD [address] Ervin Knack [place], [place]   [phone] or [phone]   [facility]   [address] [place], [place] [phone]   [facility] [address], [place], [place] [phone] Insurance/Medicaid/sponsorship through [facility] and Families [address]                                    [place], [place] [phone] Therapy/tele-psych/case  [facility] [address], [place] [phone]    Dr. Lolly Mustache  [phone]   [facility]  [employer] [facility] Dept. 1) [address] [place] 2) [address] [place] 3)  [address], [place] [phone]  [phone]   [facility] Child Abuse Hotline 234-578-7886 or [phone])[phone] (After  Hours)       You have a fever.  Your face, neck, or jaw is swollen.   This information is not intended to replace advice given to you by your health care provider. Make sure you discuss any questions you have with your health care provider.   Document Released: 08/03/2005 Document Revised: 12/18/2014 Document Reviewed: 07/30/2014 Elsevier Interactive Patient Education [employer]

## 2015-06-09 NOTE — ED Notes (Signed)
Called patient to room x1, no answer.

## 2015-06-09 NOTE — ED Provider Notes (Addendum)
CSN: [id number]     Arrival date & time 06/09/15  1756 History  By signing my name below, I, Murriel Hopperlec Bankhead, attest that this documentation has been prepared under the direction and in the presence of Langston MaskerKaren Sofia, [place]PA-C.  Electronically Signed: Murriel HopperAlec Bankhead, ED Scribe. 06/09/2015. 7:16 PM.    Chief Complaint  Patient presents with  . Wound Check  . Medication Refill     The history is provided by the patient. No language interpreter was used.   HPI Comments: Brandon Mcneil is a 34 y.o. male who presents to the Emergency Department for a wound check for an abscess on his right buttocks that has been present for a few weeks after having it drained. Pt states he did not take antibiotics for the wound after it was drained because he couldn't afford the medication, and reports today to make sure it healed properly. Pt also reports having a constant sore throat, mandibular dental pain, as well as congestion and sinus pressure. Pt reports positive sick contact with his children recently. Pt denies SOB. Pt is a smoker, and smokes around 6 cigarettes per day.    Past Medical History  Diagnosis Date  . Diabetes (HCC)   . Depression   . Hypertension   . Hypercholesteremia    History reviewed. No pertinent past surgical history. No family history on file. Social History  Substance Use Topics  . Smoking status: Current Every Day Smoker -- 0.50 packs/day    Types: Cigarettes  . Smokeless tobacco: None  . Alcohol Use: Yes     Comment: occasional    Review of Systems  A complete 10 system review of systems was obtained and all systems are negative except as noted in the HPI and PMH.    Allergies  Penicillins  Home Medications   Prior to Admission medications   Medication Sig Start Date End Date Taking? Authorizing Provider  cephALEXin (KEFLEX) 500 MG capsule Take 1 capsule (500 mg total) by mouth 4 (four) times daily. 03/22/15   Emilia BeckKaitlyn Szekalski, PA-C  clindamycin (CLEOCIN) 150 MG  capsule Take 3 capsules (450 mg total) by mouth 3 (three) times daily. 04/03/15   Eyvonne MechanicJeffrey Hedges, PA-C  doxycycline (VIBRAMYCIN) 100 MG capsule Take 1 capsule (100 mg total) by mouth 2 (two) times daily. 05/21/15   Mady GemmaElizabeth C Westfall, PA-C  FLUoxetine (PROZAC) 20 MG capsule Take 1 capsule (20 mg total) by mouth daily. RESUME THIS AS YOU WERE TAKING AT HOME Patient not taking: Reported on 03/22/2015 09/14/13   Russella DarAllison L Ellis, NP  glucose blood test strip Use as instructed 03/22/15   Emilia BeckKaitlyn Szekalski, PA-C  HYDROcodone-acetaminophen (NORCO/VICODIN) 5-325 MG per tablet Take 1-2 tablets by mouth every 6 (six) hours as needed for moderate pain or severe pain. Patient not taking: Reported on 03/22/2015 08/03/14   Ladona MowJoe Mintz, PA-C  ibuprofen (ADVIL,MOTRIN) 800 MG tablet Take 1 tablet (800 mg total) by mouth 3 (three) times daily. 03/22/15   Emilia BeckKaitlyn Szekalski, PA-C  insulin NPH-regular Human (RELION 70/30) (70-30) 100 UNIT/ML injection Inject 40-50 Units into the skin 2 (two) times daily with a meal. 01/31/14   Elson AreasLeslie K Sofia, PA-C  lisinopril (PRINIVIL,ZESTRIL) 10 MG tablet Take 1 tablet (10 mg total) by mouth daily. 03/22/15   Emilia BeckKaitlyn Szekalski, PA-C  lurasidone 20 MG TABS Take 1 tablet (20 mg total) by mouth daily with breakfast. Patient not taking: Reported on 03/22/2015 09/14/13   Russella DarAllison L Ellis, NP  oxyCODONE-acetaminophen (PERCOCET/ROXICET) 5-325 MG tablet Take 2  tablets by mouth every 4 (four) hours as needed for severe pain. 05/21/15   Elizabeth C Westfall, PA-C   BP 171/102 mmHg  Pulse 102  Temp(Src) 98.2 F (36.8 C) (Oral)  Resp 16  SpO2 100% Physical Exam  ED Course  Procedures (including critical care time)  DIAGNOSTIC STUDIES: Oxygen Saturation is 100% on room air, normal by my interpretation.    COORDINATION OF CARE: 7:03 PM Discussed treatment plan with pt at bedside and pt agreed to plan.   Labs Review Labs Reviewed - No data to display  Imaging Review No results found. I have  personally reviewed and evaluated these images and lab results as part of my medical decision-making.   EKG Interpretation None      MDM   Final diagnoses:  URI (upper respiratory infection)  Toothache  Essential hypertension    Lisinopril Keflex tessalon   [name], PA-C 06/09/15 1926  [name], MD 06/09/15 2219  [name], MD 07/20/15 [phone]

## 2015-06-09 NOTE — ED Notes (Signed)
Pt presents because unable to fill prescriptions and have wound on right buttock.

## 2015-08-10 ENCOUNTER — Encounter (HOSPITAL_COMMUNITY): Payer: Self-pay | Admitting: Emergency Medicine

## 2015-08-10 ENCOUNTER — Emergency Department (HOSPITAL_COMMUNITY)
Admission: EM | Admit: 2015-08-10 | Discharge: 2015-08-10 | Disposition: A | Discharge status: Home / Self Care | Payer: Self-pay | Attending: Emergency Medicine | Admitting: Emergency Medicine

## 2015-08-10 DIAGNOSIS — B9789 Other viral agents as the cause of diseases classified elsewhere: Secondary | ICD-10-CM

## 2015-08-10 DIAGNOSIS — Z8659 Personal history of other mental and behavioral disorders: Secondary | ICD-10-CM | POA: Insufficient documentation

## 2015-08-10 DIAGNOSIS — Z79899 Other long term (current) drug therapy: Secondary | ICD-10-CM | POA: Insufficient documentation

## 2015-08-10 DIAGNOSIS — J029 Acute pharyngitis, unspecified: Secondary | ICD-10-CM | POA: Insufficient documentation

## 2015-08-10 DIAGNOSIS — J028 Acute pharyngitis due to other specified organisms: Secondary | ICD-10-CM

## 2015-08-10 DIAGNOSIS — I10 Essential (primary) hypertension: Secondary | ICD-10-CM | POA: Insufficient documentation

## 2015-08-10 DIAGNOSIS — F1210 Cannabis abuse, uncomplicated: Secondary | ICD-10-CM | POA: Insufficient documentation

## 2015-08-10 DIAGNOSIS — Z794 Long term (current) use of insulin: Secondary | ICD-10-CM | POA: Insufficient documentation

## 2015-08-10 DIAGNOSIS — R739 Hyperglycemia, unspecified: Secondary | ICD-10-CM

## 2015-08-10 DIAGNOSIS — R1110 Vomiting, unspecified: Secondary | ICD-10-CM | POA: Insufficient documentation

## 2015-08-10 DIAGNOSIS — Z880 Allergy status to penicillin: Secondary | ICD-10-CM | POA: Insufficient documentation

## 2015-08-10 DIAGNOSIS — F17210 Nicotine dependence, cigarettes, uncomplicated: Secondary | ICD-10-CM | POA: Insufficient documentation

## 2015-08-10 DIAGNOSIS — E1165 Type 2 diabetes mellitus with hyperglycemia: Secondary | ICD-10-CM | POA: Insufficient documentation

## 2015-08-10 LAB — RAPID STREP SCREEN (MED CTR MEBANE ONLY): Streptococcus, Group A Screen (Direct): NEGATIVE

## 2015-08-10 LAB — URINALYSIS, ROUTINE W REFLEX MICROSCOPIC
BILIRUBIN URINE: NEGATIVE
GLUCOSE, UA: NEGATIVE mg/dL
Hgb urine dipstick: NEGATIVE
KETONES UR: NEGATIVE mg/dL
Nitrite: NEGATIVE
PH: 6.5 (ref 5.0–8.0)
Protein, ur: NEGATIVE mg/dL
Specific Gravity, Urine: 1.021 (ref 1.005–1.030)

## 2015-08-10 LAB — RAPID URINE DRUG SCREEN, HOSP PERFORMED
Amphetamines: NOT DETECTED
Barbiturates: NOT DETECTED
Benzodiazepines: NOT DETECTED
Cocaine: NOT DETECTED
OPIATES: NOT DETECTED
TETRAHYDROCANNABINOL: POSITIVE — AB

## 2015-08-10 LAB — BASIC METABOLIC PANEL
Anion gap: 10 (ref 5–15)
BUN: 9 mg/dL (ref 6–20)
CHLORIDE: 101 mmol/L (ref 101–111)
CO2: 24 mmol/L (ref 22–32)
Calcium: 9.3 mg/dL (ref 8.9–10.3)
Creatinine, Ser: 0.84 mg/dL (ref 0.61–1.24)
GFR calc Af Amer: >60 mL/min (ref >60)
GLUCOSE: 163 mg/dL — AB (ref 65–99)
POTASSIUM: 3.4 mmol/L — AB (ref 3.5–5.1)
Sodium: 135 mmol/L (ref 135–145)

## 2015-08-10 LAB — CBC
HEMATOCRIT: 42 % (ref 39.0–52.0)
Hemoglobin: 15.3 g/dL (ref 13.0–17.0)
MCH: 30.7 pg (ref 26.0–34.0)
MCHC: 36.4 g/dL — AB (ref 30.0–36.0)
MCV: 84.2 fL (ref 78.0–100.0)
Platelets: 223 10*3/uL (ref 150–400)
RBC: 4.99 MIL/uL (ref 4.22–5.81)
RDW: 13.3 % (ref 11.5–15.5)
WBC: 11.4 10*3/uL — ABNORMAL HIGH (ref 4.0–10.5)

## 2015-08-10 LAB — URINE MICROSCOPIC-ADD ON

## 2015-08-10 LAB — CBG MONITORING, ED: Glucose-Capillary: 123 mg/dL — ABNORMAL HIGH (ref 65–99)

## 2015-08-10 MED ORDER — GLUCOSE BLOOD VI STRP
ORAL_STRIP | Status: DC
Start: 2015-08-10 — End: 2016-06-15

## 2015-08-10 MED ORDER — LISINOPRIL 10 MG PO TABS: 10.0000 mg | ORAL_TABLET | Freq: Every day | ORAL | Status: DC

## 2015-08-10 NOTE — ED Provider Notes (Signed)
CSN: [id number]     Arrival date & time 08/10/15  1522 History   First MD Initiated Contact with Patient 08/10/15 2109     Chief Complaint  Patient presents with  . Sore Throat  . Hyperglycemia     (Consider location/radiation/quality/duration/timing/severity/associated sxs/prior Treatment) The history is provided by the patient.  Brandon Mcneil is a 34 y.o. male history diabetes, depression, hypertension here presenting with sore throat, elevated blood sugar. Patient states that he had some vomiting yesterday without a party. He drinks some alcohol this morning and did some "molly". This evening, patient states that he has some sore throat. His blood sugar was elevated. He called EMS, came and checked him out. Blood sugar was 300 at that time. He did drink some fluids afterwards. He states that he ran out of his blood pressure pills and glucose test strips. Denies vomiting.       Past Medical History  Diagnosis Date  . Diabetes (HCC)   . Depression   . Hypertension   . Hypercholesteremia    History reviewed. No pertinent past surgical history. No family history on file. Social History  Substance Use Topics  . Smoking status: Current Every Day Smoker -- 0.50 packs/day    Types: Cigarettes  . Smokeless tobacco: None  . Alcohol Use: Yes     Comment: occasional    Review of Systems  HENT: Positive for sore throat.   All other systems reviewed and are negative.     Allergies  Other and Penicillins  Home Medications   Prior to Admission medications   Medication Sig Start Date End Date Taking? Authorizing Provider  ibuprofen (ADVIL,MOTRIN) 800 MG tablet Take 1 tablet (800 mg total) by mouth 3 (three) times daily. Patient taking differently: Take 200 mg by mouth daily as needed for moderate pain.  03/22/15  Yes Emilia Beck, PA-C  insulin NPH-regular Human (RELION 70/30) (70-30) 100 UNIT/ML injection Inject 40-50 Units into the skin 2 (two) times daily with a meal.  01/31/14  Yes Lonia Skinner Sofia, PA-C  lisinopril (PRINIVIL,ZESTRIL) 10 MG tablet Take 1 tablet (10 mg total) by mouth daily. 06/09/15  Yes Lonia Skinner Sofia, PA-C  glucose blood test strip Use as instructed 03/22/15   Kaitlyn Szekalski, PA-C   BP 164/98 mmHg  Pulse 96  Temp(Src) 98 F (36.7 C) (Oral)  Resp 18  Ht  (1.727 m)  Wt 250 lb (113.399 kg)  BMI 38.02 kg/m2  SpO2 99% Physical Exam  Constitutional: He is oriented to person, place, and time. He appears well-developed and well-nourished.  HENT:  Head: Normocephalic.  OP slightly red, tonsils not enlarged   Eyes: Conjunctivae are normal. Pupils are equal, round, and reactive to light.  Neck: Normal range of motion. Neck supple.  Cardiovascular: Normal rate, regular rhythm and normal heart sounds.   Pulmonary/Chest: Effort normal and breath sounds normal. No respiratory distress. He has no wheezes. He has no rales.  Abdominal: Soft. Bowel sounds are normal. He exhibits no distension. There is no tenderness. There is no rebound.  Musculoskeletal: Normal range of motion. He exhibits no edema or tenderness.  Neurological: He is alert and oriented to person, place, and time. No cranial nerve deficit. Coordination normal.  Skin: Skin is warm and dry.  Psychiatric: He has a normal mood and affect. His behavior is normal. Judgment and thought content normal.  Nursing note and vitals reviewed.   ED Course  Procedures (including critical care time) Labs Review Labs Reviewed  BASIC METABOLIC PANEL - Abnormal; Notable for the following:    Potassium 3.4 (*)    Glucose, Bld 163 (*)    All other components within normal limits  CBC - Abnormal; Notable for the following:    WBC 11.4 (*)    MCHC 36.4 (*)    All other components within normal limits  URINALYSIS, ROUTINE W REFLEX MICROSCOPIC (NOT [facility]) - Abnormal; Notable for the following:    Leukocytes, UA SMALL (*)    All other components within normal limits  URINE RAPID DRUG  SCREEN, HOSP PERFORMED - Abnormal; Notable for the following:    Tetrahydrocannabinol POSITIVE (*)    All other components within normal limits  URINE MICROSCOPIC-ADD ON - Abnormal; Notable for the following:    Squamous Epithelial / LPF 0-5 (*)    Bacteria, UA FEW (*)    All other components within normal limits  CBG MONITORING, ED - Abnormal; Notable for the following:    Glucose-Capillary 123 (*)    All other components within normal limits  RAPID STREP SCREEN (NOT AT [facility])  CULTURE, GROUP A STREP  CBG MONITORING, ED    Imaging Review No results found. I have personally reviewed and evaluated these images and lab results as part of my medical decision-making.   EKG Interpretation None      MDM   Final diagnoses:  None   Joesph Julyimothy E Neises is a 34 y.o. male here with sore throat, hyperglycemia. CBG here is 123. Chemistry unremarkable otherwise. UA nl, UDS + marijuana. Doesn't appear clinically intoxicated. Low centor score and strep neg. Will dc home. Will refill his BP meds and glucose test strips.     Richardean Canalavid H Yao, MD 08/10/15 2214

## 2015-08-10 NOTE — Discharge Instructions (Signed)
Take your meds as prescribed.   Check your sugar frequently.   See your doctor.   Stop using drugs and drink alcohol.  Return to ER if you have trouble swallowing, blood sugar > 500, vomiting, fevers.

## 2015-08-10 NOTE — ED Notes (Addendum)
Pt c/o sore throat and high blood sugar onset this morning. Pt had EMS come to house and they told him that his CBG was 300, pt does not have the ability to check his blood sugar at home. Pt reports that last night he was out partying and did "molly.' pt has history of HTN and has been taking his mothers BP meds. Pt does not have a PMD.

## 2015-08-12 LAB — CULTURE, GROUP A STREP: STREP A CULTURE: NEGATIVE

## 2015-10-16 HISTORY — PX: INCISION AND DRAINAGE ABSCESS: SHX5864

## 2015-11-13 ENCOUNTER — Emergency Department (HOSPITAL_COMMUNITY)
Admission: EM | Admit: 2015-11-13 | Discharge: 2015-11-14 | Disposition: A | Discharge status: Home / Self Care | Payer: Self-pay | Attending: Emergency Medicine | Admitting: Emergency Medicine

## 2015-11-13 ENCOUNTER — Encounter (HOSPITAL_COMMUNITY): Payer: Self-pay | Admitting: Emergency Medicine

## 2015-11-13 DIAGNOSIS — L0231 Cutaneous abscess of buttock: Secondary | ICD-10-CM | POA: Insufficient documentation

## 2015-11-13 DIAGNOSIS — Z9114 Patient's other noncompliance with medication regimen: Secondary | ICD-10-CM

## 2015-11-13 DIAGNOSIS — I10 Essential (primary) hypertension: Secondary | ICD-10-CM | POA: Insufficient documentation

## 2015-11-13 DIAGNOSIS — Z7984 Long term (current) use of oral hypoglycemic drugs: Secondary | ICD-10-CM | POA: Insufficient documentation

## 2015-11-13 DIAGNOSIS — Z9119 Patient's noncompliance with other medical treatment and regimen: Secondary | ICD-10-CM | POA: Insufficient documentation

## 2015-11-13 DIAGNOSIS — F329 Major depressive disorder, single episode, unspecified: Secondary | ICD-10-CM | POA: Insufficient documentation

## 2015-11-13 DIAGNOSIS — Z794 Long term (current) use of insulin: Secondary | ICD-10-CM | POA: Insufficient documentation

## 2015-11-13 DIAGNOSIS — F17210 Nicotine dependence, cigarettes, uncomplicated: Secondary | ICD-10-CM | POA: Insufficient documentation

## 2015-11-13 DIAGNOSIS — Z880 Allergy status to penicillin: Secondary | ICD-10-CM | POA: Insufficient documentation

## 2015-11-13 DIAGNOSIS — E1165 Type 2 diabetes mellitus with hyperglycemia: Secondary | ICD-10-CM | POA: Insufficient documentation

## 2015-11-13 DIAGNOSIS — R739 Hyperglycemia, unspecified: Secondary | ICD-10-CM

## 2015-11-13 MED ORDER — LIDOCAINE HCL (PF) 1 % IJ SOLN
5.0000 mL | Freq: Once | INTRAMUSCULAR | Status: AC
  Administered 2015-11-13: 5 mL
  Filled 2015-11-13: qty 5

## 2015-11-13 MED ORDER — HYDROMORPHONE HCL 1 MG/ML IJ SOLN
0.5000 mg | Freq: Once | INTRAMUSCULAR | Status: AC
  Administered 2015-11-13: 0.5 mg via INTRAMUSCULAR
  Filled 2015-11-13: qty 1

## 2015-11-13 NOTE — ED Notes (Signed)
Pt. reports recurring abscess at left buttocks onset this week with no drainage .

## 2015-11-14 LAB — CBC WITH DIFFERENTIAL/PLATELET
Basophils Absolute: 0 10*3/uL (ref 0.0–0.1)
Basophils Relative: 0 %
EOS PCT: 2 %
Eosinophils Absolute: 0.2 10*3/uL (ref 0.0–0.7)
HCT: 40.5 % (ref 39.0–52.0)
HEMOGLOBIN: 14.5 g/dL (ref 13.0–17.0)
LYMPHS ABS: 2.2 10*3/uL (ref 0.7–4.0)
LYMPHS PCT: 18 %
MCH: 29.8 pg (ref 26.0–34.0)
MCHC: 35.8 g/dL (ref 30.0–36.0)
MCV: 83.2 fL (ref 78.0–100.0)
MONO ABS: 1.4 10*3/uL — AB (ref 0.1–1.0)
MONOS PCT: 11 %
Neutro Abs: 8.6 10*3/uL — ABNORMAL HIGH (ref 1.7–7.7)
Neutrophils Relative %: 69 %
Platelets: 208 10*3/uL (ref 150–400)
RBC: 4.87 MIL/uL (ref 4.22–5.81)
RDW: 13.6 % (ref 11.5–15.5)
WBC: 12.4 10*3/uL — ABNORMAL HIGH (ref 4.0–10.5)

## 2015-11-14 LAB — URINE MICROSCOPIC-ADD ON
RBC / HPF: NONE SEEN RBC/hpf (ref 0–5)
WBC, UA: NONE SEEN WBC/hpf (ref 0–5)

## 2015-11-14 LAB — URINALYSIS, ROUTINE W REFLEX MICROSCOPIC
BILIRUBIN URINE: NEGATIVE
Glucose, UA: >1000 mg/dL — AB
HGB URINE DIPSTICK: NEGATIVE
Ketones, ur: >80 mg/dL — AB
Leukocytes, UA: NEGATIVE
Nitrite: NEGATIVE
PROTEIN: NEGATIVE mg/dL
Specific Gravity, Urine: >1.046 — ABNORMAL HIGH (ref 1.005–1.030)
pH: 5 (ref 5.0–8.0)

## 2015-11-14 LAB — I-STAT CHEM 8, ED
BUN: 13 mg/dL (ref 6–20)
CREATININE: 0.8 mg/dL (ref 0.61–1.24)
Calcium, Ion: 1.1 mmol/L — ABNORMAL LOW (ref 1.12–1.23)
Chloride: 101 mmol/L (ref 101–111)
Glucose, Bld: 247 mg/dL — ABNORMAL HIGH (ref 65–99)
HEMATOCRIT: 46 % (ref 39.0–52.0)
Hemoglobin: 15.6 g/dL (ref 13.0–17.0)
POTASSIUM: 4.3 mmol/L (ref 3.5–5.1)
Sodium: 136 mmol/L (ref 135–145)
TCO2: 21 mmol/L (ref 0–100)

## 2015-11-14 LAB — CBG MONITORING, ED: Glucose-Capillary: 242 mg/dL — ABNORMAL HIGH (ref 65–99)

## 2015-11-14 MED ORDER — AMLODIPINE BESYLATE 5 MG PO TABS: 5.0000 mg | ORAL_TABLET | Freq: Every day | ORAL | Status: DC

## 2015-11-14 MED ORDER — METFORMIN HCL 500 MG PO TABS: 500.0000 mg | ORAL_TABLET | Freq: Two times a day (BID) | ORAL | Status: DC

## 2015-11-14 MED ORDER — DOXYCYCLINE HYCLATE 100 MG PO CAPS: 100.0000 mg | ORAL_CAPSULE | Freq: Two times a day (BID) | ORAL | Status: DC

## 2015-11-14 MED ORDER — SODIUM CHLORIDE 0.9 % IV BOLUS (SEPSIS)
1000.0000 mL | Freq: Once | INTRAVENOUS | Status: AC
  Administered 2015-11-14: 1000 mL via INTRAVENOUS

## 2015-11-14 NOTE — ED Provider Notes (Addendum)
CSN: [id number]     Arrival date & time 11/13/15  1841 History   First MD Initiated Contact with Patient 11/13/15 2326     Chief Complaint  Patient presents with  . Abscess    Patient is a 35 y.o. male presenting with abscess. The history is provided by the patient.  Abscess Associated symptoms: no headaches, no nausea and no vomiting   Patient presents with an abscess in his buttock area. States his been the last few days. On his left butt cheek. States it hurts. No drainage. States he has had some urinary frequency. No fevers or chills. No nausea vomiting. No abdominal pain. States he also has a smaller one further down his leg. States he also was having a bowel movement of the day and felt as if something popped in his buttock area. He was worried he could have a tumor. States he's been off his medicines for a while. States he has been able see a doctor. States he does not have insurance.  Past Medical History  Diagnosis Date  . Diabetes (HCC)   . Depression   . Hypertension   . Hypercholesteremia    History reviewed. No pertinent past surgical history. No family history on file. Social History  Substance Use Topics  . Smoking status: Current Every Day Smoker -- 0.00 packs/day    Types: Cigarettes  . Smokeless tobacco: None  . Alcohol Use: Yes     Comment: occasional    Review of Systems  Constitutional: Negative for activity change and appetite change.  Eyes: Negative for pain.  Respiratory: Negative for chest tightness and shortness of breath.   Cardiovascular: Negative for chest pain and leg swelling.  Gastrointestinal: Negative for nausea, vomiting, abdominal pain and diarrhea.  Genitourinary: Negative for flank pain.  Musculoskeletal: Negative for back pain and neck stiffness.  Skin: Positive for wound. Negative for rash.  Neurological: Negative for weakness, numbness and headaches.  Psychiatric/Behavioral: Negative for behavioral problems.      Allergies  Other  and Penicillins  Home Medications   Prior to Admission medications   Medication Sig Start Date End Date Taking? Authorizing Provider  amLODipine (NORVASC) 5 MG tablet Take 1 tablet (5 mg total) by mouth daily. 11/14/15   Benjiman Core, MD  doxycycline (VIBRAMYCIN) 100 MG capsule Take 1 capsule (100 mg total) by mouth 2 (two) times daily. 11/14/15   Benjiman Core, MD  glucose blood test strip Use as instructed 08/10/15   Richardean Canal, MD  ibuprofen (ADVIL,MOTRIN) 800 MG tablet Take 1 tablet (800 mg total) by mouth 3 (three) times daily. Patient taking differently: Take 200 mg by mouth daily as needed for moderate pain.  03/22/15   Emilia Beck, PA-C  insulin NPH-regular Human (RELION 70/30) (70-30) 100 UNIT/ML injection Inject 40-50 Units into the skin 2 (two) times daily with a meal. 01/31/14   [name], PA-C  lisinopril (PRINIVIL,ZESTRIL) 10 MG tablet Take 1 tablet (10 mg total) by mouth daily. 08/10/15   Richardean Canal, MD  metFORMIN (GLUCOPHAGE) 500 MG tablet Take 1 tablet (500 mg total) by mouth 2 (two) times daily with a meal. 11/14/15   Benjiman Core, MD   BP 151/109 mmHg  Pulse 89  Temp(Src) 98.3 F (36.8 C) (Oral)  Resp 18  SpO2 98% Physical Exam  Constitutional: He appears well-developed.  HENT:  Head: Atraumatic.  Neck: Neck supple.  Cardiovascular: Normal rate.   Pulmonary/Chest: Effort normal.  Abdominal: There is no tenderness.  Genitourinary:  2 small raised under 1 cm bumps and perianal area. Not fluctuant. Not erythematous.  Musculoskeletal: He exhibits tenderness.  Neurological: He is alert.  Skin: Skin is warm.  Tender indurated somewhat erythematous mass to left medial buttock area. Approximately 7.5 cm long. Not fluctuant.    ED Course  .Marland Kitchen.Incision and Drainage Date/Time: 11/14/2015 12:30 AM Performed by: Benjiman CorePICKERING, Lidia Clavijo Authorized by: Benjiman CorePICKERING, Quante Pettry Consent: Verbal consent obtained. Written consent not obtained. Risks and benefits: risks,  benefits and alternatives were discussed Consent given by: patient Required items: required blood products, implants, devices, and special equipment available Patient identity confirmed: verbally with patient Type: abscess Body area: anogenital Location details: gluteal cleft Anesthesia: local infiltration Local anesthetic: lidocaine 1% without epinephrine Anesthetic total: 4 ml Patient sedated: no Scalpel size: 11 Incision type: single straight Incision depth: subcutaneous Complexity: complex Drainage: purulent Drainage amount: moderate Wound treatment: wound left open Packing material: 1/4 in iodoform gauze Patient tolerance: Patient tolerated the procedure well with no immediate complications   (including critical care time) Labs Review Labs Reviewed  URINALYSIS, ROUTINE W REFLEX MICROSCOPIC (NOT AT [facility]) - Abnormal; Notable for the following:    APPearance TURBID (*)    Specific Gravity, Urine >1.046 (*)    Glucose, UA >1000 (*)    Ketones, ur >80 (*)    All other components within normal limits  URINE MICROSCOPIC-ADD ON - Abnormal; Notable for the following:    Squamous Epithelial / LPF 0-5 (*)    Bacteria, UA RARE (*)    All other components within normal limits  CBC WITH DIFFERENTIAL/PLATELET  CBG MONITORING, ED  I-STAT CHEM 8, ED    Imaging Review No results found. I have personally reviewed and evaluated these images and lab results as part of my medical decision-making.   EKG Interpretation None      MDM   Final diagnoses:  Left buttock abscess  Noncompliance with medications  Hyperglycemia  Essential hypertension    *Patient presents with abscess on his buttock area. History of same. He is diabetic and has been off his medications. Initial CBG was around 240. Urinalysis done and showed specific gravity of 1.046. Also had greater than 80 ketones. Patient is well-appearing but with the ketones will check further blood work and give him some IV fluids.  Patient has been off his antihypertensives also. Will give antibiotics since there is surrounding swelling/cellulitis that was not relieved with the drainage. Will need follow-up but patient may not do it. Will also start on metformin. Will sign out to Dr Elesa MassedWard.    Benjiman CoreNathan Amneet Cendejas, MD 11/14/15 [id number]  Benjiman CoreNathan Carlie Corpus, MD 11/14/15 [phone]

## 2015-11-14 NOTE — Discharge Instructions (Signed)
Need to follow with a primary care doctor. You may try [facility]. Remove the packing in 1-2 days. Keep warm soaks. Return if the abscess does not significantly improve. Return for fevers or if it enlarges.  Abscess An abscess is an infected area that contains a collection of pus and debris.It can occur in almost any part of the body. An abscess is also known as a furuncle or boil. CAUSES  An abscess occurs when tissue gets infected. This can occur from blockage of oil or sweat glands, infection of hair follicles, or a minor injury to the skin. As the body tries to fight the infection, pus collects in the area and creates pressure under the skin. This pressure causes pain. People with weakened immune systems have difficulty fighting infections and get certain abscesses more often.  SYMPTOMS Usually an abscess develops on the skin and becomes a painful mass that is red, warm, and tender. If the abscess forms under the skin, you may feel a moveable soft area under the skin. Some abscesses break open (rupture) on their own, but most will continue to get worse without care. The infection can spread deeper into the body and eventually into the bloodstream, causing you to feel ill.  DIAGNOSIS  Your caregiver will take your medical history and perform a physical exam. A sample of fluid may also be taken from the abscess to determine what is causing your infection. TREATMENT  Your caregiver may prescribe antibiotic medicines to fight the infection. However, taking antibiotics alone usually does not cure an abscess. Your caregiver may need to make a small cut (incision) in the abscess to drain the pus. In some cases, gauze is packed into the abscess to reduce pain and to continue draining the area. HOME CARE INSTRUCTIONS   Only take over-the-counter or prescription medicines for pain, discomfort, or fever as directed by your caregiver.  If you were prescribed antibiotics, take them as  directed. Finish them even if you start to feel better.  If gauze is used, follow your caregiver's directions for changing the gauze.  To avoid spreading the infection:  Keep your draining abscess covered with a bandage.  Wash your hands well.  Do not share personal care items, towels, or whirlpools with others.  Avoid skin contact with others.  Keep your skin and clothes clean around the abscess.  Keep all follow-up appointments as directed by your caregiver. SEEK MEDICAL CARE IF:   You have increased pain, swelling, redness, fluid drainage, or bleeding.  You have muscle aches, chills, or a general ill feeling.  You have a fever. MAKE SURE YOU:   Understand these instructions.  Will watch your condition.  Will get help right away if you are not doing well or get worse.   This information is not intended to replace advice given to you by your health care provider. Make sure you discuss any questions you have with your health care provider.   Document Released: 05/13/2005 Document Revised: 02/02/2012 Document Reviewed: 10/16/2011 Elsevier Interactive Patient Education 2016 Elsevier Inc.  Hyperglycemia Hyperglycemia occurs when the glucose (sugar) in your blood is too high. Hyperglycemia can happen for many reasons, but it most often happens to people who do not know they have diabetes or are not managing their diabetes properly.  CAUSES  Whether you have diabetes or not, there are other causes of hyperglycemia. Hyperglycemia can occur when you have diabetes, but it can also occur in other situations that you might  not be as aware of, such as: Diabetes  If you have diabetes and are having problems controlling your blood glucose, hyperglycemia could occur because of some of the following reasons:  Not following your meal plan.  Not taking your diabetes medications or not taking it properly.  Exercising less or doing less activity than you normally do.  Being  sick. Pre-diabetes  This cannot be ignored. Before people develop Type 2 diabetes, they almost always have "pre-diabetes." This is when your blood glucose levels are higher than normal, but not yet high enough to be diagnosed as diabetes. Research has shown that some long-term damage to the body, especially the heart and circulatory system, may already be occurring during pre-diabetes. If you take action to manage your blood glucose when you have pre-diabetes, you may delay or prevent Type 2 diabetes from developing. Stress  If you have diabetes, you may be "diet" controlled or on oral medications or insulin to control your diabetes. However, you may find that your blood glucose is higher than usual in the hospital whether you have diabetes or not. This is often referred to as "stress hyperglycemia." Stress can elevate your blood glucose. This happens because of hormones put out by the body during times of stress. If stress has been the cause of your high blood glucose, it can be followed regularly by your caregiver. That way he/she can make sure your hyperglycemia does not continue to get worse or progress to diabetes. Steroids  Steroids are medications that act on the infection fighting system (immune system) to block inflammation or infection. One side effect can be a rise in blood glucose. Most people can produce enough extra insulin to allow for this rise, but for those who cannot, steroids make blood glucose levels go even higher. It is not unusual for steroid treatments to "uncover" diabetes that is developing. It is not always possible to determine if the hyperglycemia will go away after the steroids are stopped. A special blood test called an A1c is sometimes done to determine if your blood glucose was elevated before the steroids were started. SYMPTOMS  Thirsty.  Frequent urination.  Dry mouth.  Blurred vision.  Tired or fatigue.  Weakness.  Sleepy.  Tingling in feet or  leg. DIAGNOSIS  Diagnosis is made by monitoring blood glucose in one or all of the following ways:  A1c test. This is a chemical found in your blood.  Fingerstick blood glucose monitoring.  Laboratory results. TREATMENT  First, knowing the cause of the hyperglycemia is important before the hyperglycemia can be treated. Treatment may include, but is not be limited to:  Education.  Change or adjustment in medications.  Change or adjustment in meal plan.  Treatment for an illness, infection, etc.  More frequent blood glucose monitoring.  Change in exercise plan.  Decreasing or stopping steroids.  Lifestyle changes. HOME CARE INSTRUCTIONS   Test your blood glucose as directed.  Exercise regularly. Your caregiver will give you instructions about exercise. Pre-diabetes or diabetes which comes on with stress is helped by exercising.  Eat wholesome, balanced meals. Eat often and at regular, fixed times. Your caregiver or nutritionist will give you a meal plan to guide your sugar intake.  Being at an ideal weight is important. If needed, losing as little as 10 to 15 pounds may help improve blood glucose levels. SEEK MEDICAL CARE IF:   You have questions about medicine, activity, or diet.  You continue to have symptoms (problems such as increased  thirst, urination, or weight gain). SEEK IMMEDIATE MEDICAL CARE IF:   You are vomiting or have diarrhea.  Your breath smells fruity.  You are breathing faster or slower.  You are very sleepy or incoherent.  You have numbness, tingling, or pain in your feet or hands.  You have chest pain.  Your symptoms get worse even though you have been following your caregiver's orders.  If you have any other questions or concerns.   This information is not intended to replace advice given to you by your health care provider. Make sure you discuss any questions you have with your health care provider.   Document Released: 01/27/2001  Document Revised: 10/26/2011 Document Reviewed: 04/09/2015 Elsevier Interactive Patient Education 2016 Elsevier Inc.  Incision and Drainage Incision and drainage is a procedure in which a sac-like structure (cystic structure) is opened and drained. The area to be drained usually contains material such as pus, fluid, or blood.  LET YOUR CAREGIVER KNOW ABOUT:   Allergies to medicine.  Medicines taken, including vitamins, herbs, eyedrops, over-the-counter medicines, and creams.  Use of steroids (by mouth or creams).  Previous problems with anesthetics or numbing medicines.  History of bleeding problems or blood clots.  Previous surgery.  Other health problems, including diabetes and kidney problems.  Possibility of pregnancy, if this applies. RISKS AND COMPLICATIONS  Pain.  Bleeding.  Scarring.  Infection. BEFORE THE PROCEDURE  You may need to have an ultrasound or other imaging tests to see how large or deep your cystic structure is. Blood tests may also be used to determine if you have an infection or how severe the infection is. You may need to have a tetanus shot. PROCEDURE  The affected area is cleaned with a cleaning fluid. The cyst area will then be numbed with a medicine (local anesthetic). A small incision will be made in the cystic structure. A syringe or catheter may be used to drain the contents of the cystic structure, or the contents may be squeezed out. The area will then be flushed with a cleansing solution. After cleansing the area, it is often gently packed with a gauze or another wound dressing. Once it is packed, it will be covered with gauze and tape or some other type of wound dressing. AFTER THE PROCEDURE   Often, you will be allowed to go home right after the procedure.  You may be given antibiotic medicine to prevent or heal an infection.  If the area was packed with gauze or some other wound dressing, you will likely need to come back in 1 to 2 days  to get it removed.  The area should heal in about 14 days.   This information is not intended to replace advice given to you by your health care provider. Make sure you discuss any questions you have with your health care provider.   Document Released: 01/27/2001 Document Revised: 02/02/2012 Document Reviewed: 09/28/2011 Elsevier Interactive Patient Education [employer] Elsevier Inc.

## 2015-11-14 NOTE — ED Notes (Signed)
MD at bedside. 

## 2015-11-25 ENCOUNTER — Inpatient Hospital Stay: Payer: Self-pay

## 2015-12-11 ENCOUNTER — Encounter (HOSPITAL_COMMUNITY): Payer: Self-pay

## 2015-12-11 ENCOUNTER — Emergency Department (HOSPITAL_COMMUNITY)
Admission: EM | Admit: 2015-12-11 | Discharge: 2015-12-11 | Disposition: A | Discharge status: Home / Self Care | Payer: Self-pay | Attending: Emergency Medicine | Admitting: Emergency Medicine

## 2015-12-11 DIAGNOSIS — Z794 Long term (current) use of insulin: Secondary | ICD-10-CM | POA: Insufficient documentation

## 2015-12-11 DIAGNOSIS — M545 Low back pain, unspecified: Secondary | ICD-10-CM

## 2015-12-11 DIAGNOSIS — E119 Type 2 diabetes mellitus without complications: Secondary | ICD-10-CM | POA: Insufficient documentation

## 2015-12-11 DIAGNOSIS — Z7984 Long term (current) use of oral hypoglycemic drugs: Secondary | ICD-10-CM | POA: Insufficient documentation

## 2015-12-11 DIAGNOSIS — I10 Essential (primary) hypertension: Secondary | ICD-10-CM | POA: Insufficient documentation

## 2015-12-11 DIAGNOSIS — Z79899 Other long term (current) drug therapy: Secondary | ICD-10-CM | POA: Insufficient documentation

## 2015-12-11 DIAGNOSIS — Z792 Long term (current) use of antibiotics: Secondary | ICD-10-CM | POA: Insufficient documentation

## 2015-12-11 DIAGNOSIS — F329 Major depressive disorder, single episode, unspecified: Secondary | ICD-10-CM | POA: Insufficient documentation

## 2015-12-11 DIAGNOSIS — F17210 Nicotine dependence, cigarettes, uncomplicated: Secondary | ICD-10-CM | POA: Insufficient documentation

## 2015-12-11 LAB — CBG MONITORING, ED
Glucose-Capillary: 291 mg/dL — ABNORMAL HIGH (ref 65–99)
Glucose-Capillary: 259 mg/dL — ABNORMAL HIGH (ref 65–99)

## 2015-12-11 NOTE — ED Notes (Signed)
Patient states he began having left lower back pain 2 weeks ago and is now having bilateral lower back pain. Patient also reports tingling and pain down the left leg.

## 2015-12-11 NOTE — Discharge Instructions (Signed)
Please use Ibuprofen, rest, and ice as needed for pain. If symptoms persist please follow-up with orthopedic surgeon for further evaluation and management.   Back Pain, Adult Back pain is very common in adults.The cause of back pain is rarely dangerous and the pain often gets better over time.The cause of your back pain may not be known. Some common causes of back pain include:  Strain of the muscles or ligaments supporting the spine.  Wear and tear (degeneration) of the spinal disks.  Arthritis.  Direct injury to the back. For many people, back pain may return. Since back pain is rarely dangerous, most people can learn to manage this condition on their own. HOME CARE INSTRUCTIONS Watch your back pain for any changes. The following actions may help to lessen any discomfort you are feeling:  Remain active. It is stressful on your back to sit or stand in one place for long periods of time. Do not sit, drive, or stand in one place for more than 30 minutes at a time. Take short walks on even surfaces as soon as you are able.Try to increase the length of time you walk each day.  Exercise regularly as directed by your health care provider. Exercise helps your back heal faster. It also helps avoid future injury by keeping your muscles strong and flexible.  Do not stay in bed.Resting more than 1-2 days can delay your recovery.  Pay attention to your body when you bend and lift. The most comfortable positions are those that put less stress on your recovering back. Always use proper lifting techniques, including:  Bending your knees.  Keeping the load close to your body.  Avoiding twisting.  Find a comfortable position to sleep. Use a firm mattress and lie on your side with your knees slightly bent. If you lie on your back, put a pillow under your knees.  Avoid feeling anxious or stressed.Stress increases muscle tension and can worsen back pain.It is important to recognize when you are  anxious or stressed and learn ways to manage it, such as with exercise.  Take medicines only as directed by your health care provider. Over-the-counter medicines to reduce pain and inflammation are often the most helpful.Your health care provider may prescribe muscle relaxant drugs.These medicines help dull your pain so you can more quickly return to your normal activities and healthy exercise.  Apply ice to the injured area:  Put ice in a plastic bag.  Place a towel between your skin and the bag.  Leave the ice on for 20 minutes, 2-3 times a day for the first 2-3 days. After that, ice and heat may be alternated to reduce pain and spasms.  Maintain a healthy weight. Excess weight puts extra stress on your back and makes it difficult to maintain good posture. SEEK MEDICAL CARE IF:  You have pain that is not relieved with rest or medicine.  You have increasing pain going down into the legs or buttocks.  You have pain that does not improve in one week.  You have night pain.  You lose weight.  You have a fever or chills. SEEK IMMEDIATE MEDICAL CARE IF:   You develop new bowel or bladder control problems.  You have unusual weakness or numbness in your arms or legs.  You develop nausea or vomiting.  You develop abdominal pain.  You feel faint.   This information is not intended to replace advice given to you by your health care provider. Make sure you discuss any  questions you have with your health care provider.   Document Released: 08/03/2005 Document Revised: 08/24/2014 Document Reviewed: 12/05/2013 Elsevier Interactive Patient Education [employer].

## 2015-12-11 NOTE — ED Provider Notes (Signed)
CSN: [id number]     Arrival date & time 12/11/15  1255 History   First MD Initiated Contact with Patient 12/11/15 1524     Chief Complaint  Patient presents with  . Back Pain   HPI   35 YOM presents today with complaints of back pain. Patient reports that approximately one month ago he fell after period of intoxication. Patient notes that he had upper back pain at that time, this resolved completely.  Patient states that approximately 2 weeks ago he started having lower back pain, reported this is down on his sacroiliac area that spread along the bilateral lower lumbar soft tissues; he states that the symptoms were not present after the fall. Patient reports this pain comes and goes, is worse with forward flexion or ambulation. Patient reports that occasionally has radiation of symptoms described as sharp and shooting into his left leg. Patient denies any fever, chills, nausea, vomiting, abdominal pain, loss of lower sensation strength or motor function. Patient denies a history of chronic back issues, reports that he attempted taking ibuprofen 800 mg once which did not improve his symptoms. Patient denies any red flags for back pain.    Past Medical History  Diagnosis Date  . Diabetes (HCC)   . Depression   . Hypertension   . Hypercholesteremia    History reviewed. No pertinent past surgical history. History reviewed. No pertinent family history. Social History  Substance Use Topics  . Smoking status: Current Every Day Smoker -- 15.00 packs/day    Types: Cigarettes  . Smokeless tobacco: Never Used  . Alcohol Use: Yes     Comment: occasional    Review of Systems  All other systems reviewed and are negative.   Allergies  Other and Penicillins  Home Medications   Prior to Admission medications   Medication Sig Start Date End Date Taking? Authorizing Provider  amLODipine (NORVASC) 5 MG tablet Take 1 tablet (5 mg total) by mouth daily. 11/14/15   Benjiman Core, MD  doxycycline  (VIBRAMYCIN) 100 MG capsule Take 1 capsule (100 mg total) by mouth 2 (two) times daily. 11/14/15   Benjiman Core, MD  glucose blood test strip Use as instructed 08/10/15   Richardean Canal, MD  ibuprofen (ADVIL,MOTRIN) 200 MG tablet Take 200-400 mg by mouth every 6 (six) hours as needed for moderate pain.    Historical Provider, MD  insulin aspart protamine- aspart (NOVOLOG MIX 70/30) (70-30) 100 UNIT/ML injection Inject 20-40 Units into the skin 2 (two) times daily.    Historical Provider, MD  metFORMIN (GLUCOPHAGE) 500 MG tablet Take 1 tablet (500 mg total) by mouth 2 (two) times daily with a meal. 11/14/15   Benjiman Core, MD   BP 174/109 mmHg  Pulse 80  Temp(Src) 98.3 F (36.8 C) (Oral)  Resp 18  Ht  (1.727 m)  Wt 104.327 kg  BMI 34.98 kg/m2  SpO2 94% Physical Exam  Constitutional: He is oriented to person, place, and time. He appears well-developed and well-nourished. No distress.  HENT:  Head: Normocephalic.  Neck: Normal range of motion. Neck supple.  Pulmonary/Chest: Effort normal.  Abdominal: Soft. He exhibits no distension. There is no tenderness.  Musculoskeletal: Normal range of motion. He exhibits tenderness. He exhibits no edema.  No C, T, or L spine tenderness to palpation. No obvious signs of trauma, deformity, infection, step-offs. Lung expansion normal. No scoliosis or kyphosis. Bilateral lower extremity strength 5 out of 5, sensation grossly intact, patellar reflexes 2+, pedal pulses 2+,  Refill less than 3 seconds.  Patient has minor tenderness to palpation of the sacroiliac joints bilateral, with minimal surrounding lower lumbar soft tissue tenderness.  Straight leg negative Ambulates without difficulty  Neurological: He is alert and oriented to person, place, and time.  Skin: Skin is warm and dry. He is not diaphoretic.  Psychiatric: He has a normal mood and affect. His behavior is normal. Judgment and thought content normal.  Nursing note and vitals  reviewed.   ED Course  Procedures (including critical care time) Labs Review Labs Reviewed  CBG MONITORING, ED - Abnormal; Notable for the following:    Glucose-Capillary 291 (*)    All other components within normal limits    Imaging Review No results found. I have personally reviewed and evaluated these images and lab results as part of my medical decision-making.   EKG Interpretation None      MDM   Final diagnoses:  Bilateral low back pain without sciatica    Labs:  Imaging: none indicated  Consults:  Therapeutics:  Discharge Meds: iibuprofen  Assessment/Plan:35 year old male presents today with on-call get it back pain. Patient has pain at the sacroiliac and lower lumbar region. This is intermittent, no signs of infectious etiology or any other red flags.atient ambulates without difficulty, he will be instructed to use ibuprofen 800 mg 3 times a day 5 days. He is instructed to rest, use ice, follow-up with orthopedic surgeon if symptoms persist beyond 2 weeks. Patient is given strict return precautions, he verbalizes understanding and agreement to today's plan had no further questions or concerns at the time discharge         Eyvonne MechanicJeffrey Shateria Paternostro, PA-C 12/11/15 1539

## 2016-01-21 ENCOUNTER — Ambulatory Visit: Payer: Self-pay

## 2016-01-28 ENCOUNTER — Ambulatory Visit: Payer: Self-pay | Attending: Internal Medicine

## 2016-02-15 HISTORY — PX: INCISION AND DRAINAGE ABSCESS: SHX5864

## 2016-02-19 ENCOUNTER — Ambulatory Visit: Payer: Self-pay | Admitting: Internal Medicine

## 2016-02-26 NOTE — ED Provider Notes (Signed)
Medical screening examination/treatment/procedure(s) were performed by non-physician practitioner and as supervising physician I was immediately available for consultation/collaboration.   EKG Interpretation None       Lillien Petronio, MD 02/26/16 1405 

## 2016-02-28 ENCOUNTER — Encounter: Payer: Self-pay | Admitting: Internal Medicine

## 2016-02-28 ENCOUNTER — Ambulatory Visit (INDEPENDENT_AMBULATORY_CARE_PROVIDER_SITE_OTHER): Payer: Self-pay | Admitting: Internal Medicine

## 2016-02-28 VITALS — BP 158/110 | HR 69 | Temp 98.4°F | Resp 18 | Ht 68.0 in | Wt 230.0 lb

## 2016-02-28 DIAGNOSIS — E118 Type 2 diabetes mellitus with unspecified complications: Secondary | ICD-10-CM

## 2016-02-28 DIAGNOSIS — I10 Essential (primary) hypertension: Secondary | ICD-10-CM

## 2016-02-28 LAB — GLUCOSE, POCT (MANUAL RESULT ENTRY): POC Glucose: 519 mg/dl — AB (ref 70–99)

## 2016-02-28 MED ORDER — GLIPIZIDE 10 MG PO TABS: 10.0000 mg | ORAL_TABLET | Freq: Two times a day (BID) | ORAL | Status: DC

## 2016-02-28 MED ORDER — AMLODIPINE BESYLATE 10 MG PO TABS
10.0000 mg | ORAL_TABLET | Freq: Every day | ORAL | Status: DC
Start: 2016-02-28 — End: 2016-06-15

## 2016-02-28 MED ORDER — ASPIRIN 81 MG PO TABS: 81.0000 mg | ORAL_TABLET | Freq: Every day | ORAL | Status: DC

## 2016-02-28 MED ORDER — METFORMIN HCL ER (MOD) 500 MG PO TB24: ORAL_TABLET | ORAL | Status: DC

## 2016-02-28 NOTE — Patient Instructions (Signed)
Drink a glass of water before every meal Drink 6-8 glasses of water daily Eat three meals daily Eat a protein and healthy fat with every meal (eggs,fish, chicken, turkey and limit red meats) Eat 5 servings of vegetables daily, mix the colors Eat 2 servings of fruit daily with skin, if skin is edible Use smaller plates Put food/utensils down as you chew and swallow each bite Eat at a table with friends/family at least once daily, no TV Do not eat in front of the TV 

## 2016-02-28 NOTE — Progress Notes (Signed)
Subjective:    Patient ID: Brandon Mcneil, male    DOB: 1980/12/30, 35 y.o.   MRN: [id number]  HPI   New patient to establish  1.  Reported Bipolar Disorder:  Was on Prozac and Latuda in past for what he was told was Bipolar disorder.  [facility] and was diagnosed and started on above meds with some improvement in his Mental Health.  2.  DM:  States diagnosed in 2014 and states sugar was in 600s.  Had gained 30 lbs to 250 lbs with his psychotropic medication.  Was placed on insulin, but in 2015, used Metformin from friends and sugars were under good control, but developed loose stools.  Has been using insulin intermittently recently from friends.  3.  Essential Hypertension:  Diagnosed 2014.  Angioedema symptoms with Lisinopril     Allergies  Allergen Reactions  . Other     Seasonal Allergies  . Penicillins Itching and Rash    Has patient had a PCN reaction causing immediate rash, facial/tongue/throat swelling, SOB or lightheadedness with hypotension: No Has patient had a PCN reaction causing severe rash involving mucus membranes or skin necrosis: No Has patient had a PCN reaction that required hospitalization No Has patient had a PCN reaction occurring within the last 10 years: No If all of the above answers are "NO", then may proceed with Cephalosporin use.   Lisinopril--angioedema  Past Medical History:  Diagnosis Date  . Depression   . Diabetes (HCC)   . Hypercholesteremia   . Hypertension     No past surgical history on file.   Social History   Social History  . Marital status: Single    Spouse name: N/A  . Number of children: N/A  . Years of education: N/A   Occupational History  . Not on file.   Social History Main Topics  . Smoking status: Current Every Day Smoker    Packs/day: 0.50    Years: 14.00    Types: Cigarettes  . Smokeless tobacco: Never Used     Comment: 1/2 pack a day  . Alcohol use 0.0 oz/week   Comment: occasional  . Drug use: No     Comment: Kirt BoysMolly former use   marijuana quit 6 months ago  . Sexual activity: Yes    Partners: Female    Birth control/ protection: None   Other Topics Concern  . Not on file   Social History Narrative  . No narrative on file    No family history on file.  Review of Systems     Objective:   Physical Exam   CBG:  519  NAD HEENT: PERRL, EOMI, discs sharp without exudate or hemorrhage, TMs pearly gray, throat without injection, MMM Neck:  Supple, no adenopathy, no thyromegaly. Chest:  CTA CV:  RRR with normal S1 and S2, NO S3, S4 or murmur.  No carotid bruits, Carotid, radial and DP pulses normal and equal Abd:  S, NT, No HSM or mass, + BS LE:  No edema.        Assessment & Plan:   1. DM  2:  Start Metformin ER 500 mg and Glipizide 10 mg twice daily with meals.  Discussed healthy diet and eating practices at length. Check sugars twice daily and document--bring in sugars in 2 weeks.   Check A1C  ASA 81 mg daily with meal Check feet at night daily Discuss immunizations next visit.  2.  Essential  Hypertension:  Start Amlodipine 10 mg daily. Follow up in 2 weeks for both health concerns   3.  Bipolar Disorder:  Appears stable currently, but will need to address this in follow up.   Suspect he will need psychiatry referral.  Not clear he will consider [facility] again in future.

## 2016-02-29 LAB — HGB A1C W/O EAG: Hgb A1c MFr Bld: 11.6 % — ABNORMAL HIGH (ref 4.8–5.6)

## 2016-03-10 ENCOUNTER — Encounter (HOSPITAL_COMMUNITY): Payer: Self-pay

## 2016-03-10 ENCOUNTER — Emergency Department (HOSPITAL_COMMUNITY)
Admission: EM | Admit: 2016-03-10 | Discharge: 2016-03-10 | Disposition: A | Discharge status: Home / Self Care | Payer: Self-pay | Attending: Emergency Medicine | Admitting: Emergency Medicine

## 2016-03-10 DIAGNOSIS — I10 Essential (primary) hypertension: Secondary | ICD-10-CM | POA: Insufficient documentation

## 2016-03-10 DIAGNOSIS — E1310 Other specified diabetes mellitus with ketoacidosis without coma: Secondary | ICD-10-CM | POA: Insufficient documentation

## 2016-03-10 DIAGNOSIS — E119 Type 2 diabetes mellitus without complications: Secondary | ICD-10-CM

## 2016-03-10 DIAGNOSIS — Z7984 Long term (current) use of oral hypoglycemic drugs: Secondary | ICD-10-CM | POA: Insufficient documentation

## 2016-03-10 DIAGNOSIS — Z7982 Long term (current) use of aspirin: Secondary | ICD-10-CM | POA: Insufficient documentation

## 2016-03-10 DIAGNOSIS — L0231 Cutaneous abscess of buttock: Secondary | ICD-10-CM | POA: Insufficient documentation

## 2016-03-10 DIAGNOSIS — F17210 Nicotine dependence, cigarettes, uncomplicated: Secondary | ICD-10-CM | POA: Insufficient documentation

## 2016-03-10 DIAGNOSIS — L0291 Cutaneous abscess, unspecified: Secondary | ICD-10-CM

## 2016-03-10 LAB — URINE MICROSCOPIC-ADD ON: RBC / HPF: NONE SEEN RBC/hpf (ref 0–5)

## 2016-03-10 LAB — CBC WITH DIFFERENTIAL/PLATELET
BASOS PCT: 0 %
Basophils Absolute: 0 10*3/uL (ref 0.0–0.1)
Eosinophils Absolute: 0.1 10*3/uL (ref 0.0–0.7)
Eosinophils Relative: 1 %
HCT: 42.8 % (ref 39.0–52.0)
HEMOGLOBIN: 15.8 g/dL (ref 13.0–17.0)
Lymphocytes Relative: 21 %
Lymphs Abs: 2.4 10*3/uL (ref 0.7–4.0)
MCH: 31 pg (ref 26.0–34.0)
MCHC: 36.9 g/dL — AB (ref 30.0–36.0)
MCV: 84.1 fL (ref 78.0–100.0)
MONOS PCT: 9 %
Monocytes Absolute: 1.1 10*3/uL — ABNORMAL HIGH (ref 0.1–1.0)
NEUTROS ABS: 8 10*3/uL — AB (ref 1.7–7.7)
Neutrophils Relative %: 69 %
Platelets: 220 10*3/uL (ref 150–400)
RBC: 5.09 MIL/uL (ref 4.22–5.81)
RDW: 12.5 % (ref 11.5–15.5)
WBC: 11.9 10*3/uL — ABNORMAL HIGH (ref 4.0–10.5)

## 2016-03-10 LAB — I-STAT VENOUS BLOOD GAS, ED
ACID-BASE DEFICIT: 1 mmol/L (ref 0.0–2.0)
BICARBONATE: 24.6 meq/L — AB (ref 20.0–24.0)
O2 Saturation: 67 %
PCO2 VEN: 43.2 mmHg — AB (ref 45.0–50.0)
PH VEN: 7.363 — AB (ref 7.250–7.300)
PO2 VEN: 36 mmHg (ref 31.0–45.0)
TCO2: 26 mmol/L (ref 0–100)

## 2016-03-10 LAB — URINALYSIS, ROUTINE W REFLEX MICROSCOPIC
BILIRUBIN URINE: NEGATIVE
Glucose, UA: >1000 mg/dL — AB
Hgb urine dipstick: NEGATIVE
Ketones, ur: NEGATIVE mg/dL
Leukocytes, UA: NEGATIVE
NITRITE: NEGATIVE
PH: 5.5 (ref 5.0–8.0)
Protein, ur: NEGATIVE mg/dL
SPECIFIC GRAVITY, URINE: 1.045 — AB (ref 1.005–1.030)

## 2016-03-10 LAB — BASIC METABOLIC PANEL
ANION GAP: 8 (ref 5–15)
BUN: 9 mg/dL (ref 6–20)
CHLORIDE: 103 mmol/L (ref 101–111)
CO2: 23 mmol/L (ref 22–32)
Calcium: 9 mg/dL (ref 8.9–10.3)
Creatinine, Ser: 0.96 mg/dL (ref 0.61–1.24)
GFR calc non Af Amer: >60 mL/min (ref >60)
Glucose, Bld: 378 mg/dL — ABNORMAL HIGH (ref 65–99)
Potassium: 4 mmol/L (ref 3.5–5.1)
Sodium: 134 mmol/L — ABNORMAL LOW (ref 135–145)

## 2016-03-10 LAB — CBG MONITORING, ED: Glucose-Capillary: 525 mg/dL (ref 65–99)

## 2016-03-10 LAB — I-STAT CG4 LACTIC ACID, ED: Lactic Acid, Venous: 1.98 mmol/L (ref 0.5–1.9)

## 2016-03-10 MED ORDER — SODIUM CHLORIDE 0.9 % IV BOLUS (SEPSIS)
500.0000 mL | Freq: Once | INTRAVENOUS | Status: AC
  Administered 2016-03-10: 500 mL via INTRAVENOUS

## 2016-03-10 MED ORDER — CLINDAMYCIN PHOSPHATE 600 MG/50ML IV SOLN
600.0000 mg | Freq: Once | INTRAVENOUS | Status: AC
  Administered 2016-03-10: 600 mg via INTRAVENOUS
  Filled 2016-03-10: qty 50

## 2016-03-10 MED ORDER — LIDOCAINE-EPINEPHRINE (PF) 2 %-1:200000 IJ SOLN
10.0000 mL | Freq: Once | INTRAMUSCULAR | Status: AC
  Administered 2016-03-10: 10 mL
  Filled 2016-03-10: qty 20

## 2016-03-10 MED ORDER — CLINDAMYCIN HCL 300 MG PO CAPS
300.0000 mg | ORAL_CAPSULE | Freq: Four times a day (QID) | ORAL | 0 refills | Status: DC
Start: 2016-03-10 — End: 2016-06-15

## 2016-03-10 MED ORDER — OXYCODONE-ACETAMINOPHEN 5-325 MG PO TABS
2.0000 | ORAL_TABLET | Freq: Once | ORAL | Status: AC
  Administered 2016-03-10: 2 via ORAL
  Filled 2016-03-10: qty 2

## 2016-03-10 MED ORDER — OXYCODONE-ACETAMINOPHEN 5-325 MG PO TABS
2.0000 | ORAL_TABLET | ORAL | 0 refills | Status: DC | PRN
Start: 2016-03-10 — End: 2016-06-15

## 2016-03-10 NOTE — Discharge Instructions (Signed)
Return to the emergency department if you develop fevers, chills or worsening pain. Your packing needs to be changed in 48 hours. Your doctor may do this or he may return to the emergency department. You must schedule appointment with your doctor to review your diabetes management. You may need to go back to using insulin.

## 2016-03-10 NOTE — ED Notes (Signed)
Pt verbalized understanding of discharge instructions and follow-up care. Denies further questions at this time. Ambulatory to the lobby to wait for ride.

## 2016-03-10 NOTE — ED Triage Notes (Signed)
Pt reports he has a boil below his left buttock. He reports he had the same thing 3-4 months ago.

## 2016-03-10 NOTE — ED Provider Notes (Addendum)
[facility]-EMERGENCY DEPT Provider Note   CSN: [id number] Arrival date & time: 03/10/16  1701  First Provider Contact:  None       History   Chief Complaint Chief Complaint  Patient presents with  . Recurrent Skin Infections    HPI Brandon Mcneil is a 35 y.o. male.  HPI Patient has had a tender nodule develop on his left buttock. He reports this started Approximately 3 days ago. He reports it has enlarged and is now very painful to sit on or to walk. He states he had an abscess once before and it was very similar. He is not having other problems at this time. He does report he is diabetic and his blood sugars are running high. He has not had fevers or chills. No General malaise. He reports his insulin was discontinued by his primary care physician about a month ago and he was placed on metformin and glipizide. He is not sure why the change was made. Past Medical History:  Diagnosis Date  . Depression   . Diabetes (HCC)   . Hypercholesteremia   . Hypertension     Patient Active Problem List   Diagnosis Date Noted  . Medication overdose 10/31/2013  . Bipolar I disorder, most recent episode depressed (HCC) 09/11/2013  . Tobacco use disorder 09/11/2013  . Newly diagnosed diabetes (HCC) 09/11/2013  . Dehydration 09/11/2013  . Pain, dental 09/11/2013  . DKA (diabetic ketoacidoses) (HCC) 09/10/2013    History reviewed. No pertinent surgical history.     Home Medications    Prior to Admission medications   Medication Sig Start Date End Date Taking? Authorizing Provider  amLODipine (NORVASC) 10 MG tablet Take 1 tablet (10 mg total) by mouth daily. 02/28/16   Julieanne Manson, MD  aspirin 81 MG tablet Take 1 tablet (81 mg total) by mouth daily. 02/28/16   Julieanne Manson, MD  clindamycin (CLEOCIN) 300 MG capsule Take 1 capsule (300 mg total) by mouth 4 (four) times daily. X 7 days 03/10/16   Arby Barrette, MD  glipiZIDE (GLUCOTROL) 10 MG tablet Take 1 tablet (10 mg total)  by mouth 2 (two) times daily before a meal. 02/28/16   Julieanne Manson, MD  glucose blood test strip Use as instructed 08/10/15   Charlynne Pander, MD  ibuprofen (ADVIL,MOTRIN) 200 MG tablet Take 200-400 mg by mouth every 6 (six) hours as needed for moderate pain.    Historical Provider, MD  metFORMIN (GLUMETZA) 500 MG (MOD) 24 hr tablet 1 tab by mouth twice daily 02/28/16   Julieanne Manson, MD  oxyCODONE-acetaminophen (PERCOCET) 5-325 MG tablet Take 2 tablets by mouth every 4 (four) hours as needed. 03/10/16   Arby Barrette, MD    Family History History reviewed. No pertinent family history.  Social History Social History  Substance Use Topics  . Smoking status: Current Every Day Smoker    Packs/day: 0.50    Years: 14.00    Types: Cigarettes  . Smokeless tobacco: Never Used     Comment: 1/2 pack a day  . Alcohol use 0.0 oz/week     Comment: occasional     Allergies   Lisinopril; Other; and Penicillins   Review of Systems Review of Systems  10 Systems reviewed and are negative for acute change except as noted in the HPI.  Physical Exam Updated Vital Signs BP 132/71   Pulse 71   Temp 98.4 F (36.9 C) (Oral)   Resp 16   Ht 5\' 8"  (1.727 m)  Wt 230 lb (104.3 kg)   SpO2 100%   BMI 34.97 kg/m   Physical Exam  Constitutional: He is oriented to person, place, and time.  Patient is moderately obese. He is alert and nontoxic. No distress. Mental status is clear.  HENT:  Head: Normocephalic and atraumatic.  Eyes: EOM are normal. No scleral icterus.  Cardiovascular: Normal rate, regular rhythm, normal heart sounds and intact distal pulses.   Pulmonary/Chest: Effort normal and breath sounds normal.  Abdominal: Soft. He exhibits no distension. There is no tenderness.  Genitourinary:  Genitourinary Comments: Left buttock has an approximately 5 cm fluctuant abscess lateral to the gluteal cleft. This does not include the perianal area.  Musculoskeletal: He exhibits no  edema or tenderness.  Neurological: He is alert and oriented to person, place, and time. He exhibits normal muscle tone. Coordination normal.  Skin: Skin is warm and dry.  Psychiatric: He has a normal mood and affect.      ED Treatments / Results  Labs (all labs ordered are listed, but only abnormal results are displayed) Labs Reviewed  BASIC METABOLIC PANEL - Abnormal; Notable for the following:       Result Value   Sodium 134 (*)    Glucose, Bld 378 (*)    All other components within normal limits  CBC WITH DIFFERENTIAL/PLATELET - Abnormal; Notable for the following:    WBC 11.9 (*)    MCHC 36.9 (*)    Neutro Abs 8.0 (*)    Monocytes Absolute 1.1 (*)    All other components within normal limits  URINALYSIS, ROUTINE W REFLEX MICROSCOPIC (NOT AT [facility]) - Abnormal; Notable for the following:    Specific Gravity, Urine 1.045 (*)    Glucose, UA >1000 (*)    All other components within normal limits  URINE MICROSCOPIC-ADD ON - Abnormal; Notable for the following:    Squamous Epithelial / LPF 0-5 (*)    Bacteria, UA RARE (*)    All other components within normal limits  CBG MONITORING, ED - Abnormal; Notable for the following:    Glucose-Capillary 525 (*)    All other components within normal limits  I-STAT CG4 LACTIC ACID, ED - Abnormal; Notable for the following:    Lactic Acid, Venous 1.98 (*)    All other components within normal limits  I-STAT VENOUS BLOOD GAS, ED - Abnormal; Notable for the following:    pH, Ven 7.363 (*)    pCO2, Ven 43.2 (*)    Bicarbonate 24.6 (*)    All other components within normal limits  BLOOD GAS, VENOUS    EKG  EKG Interpretation None       Radiology No results found.  Procedures .Marland KitchenIncision and Drainage Date/Time: 03/10/2016 9:14 PM Performed by: Arby Barrette Authorized by: Arby Barrette   Location:    Type:  Abscess   Location:  Anogenital   Anogenital location:  Gluteal cleft Pre-procedure details:    Skin preparation:   Betadine Anesthesia (see MAR for exact dosages):    Anesthesia method:  Local infiltration   Local anesthetic:  Lidocaine 2% WITH epi Procedure type:    Complexity:  Complex Procedure details:    Needle aspiration: no     Incision types:  Single with marsupialization   Incision depth:  Subcutaneous   Scalpel blade:  11   Wound management:  Probed and deloculated   Drainage:  Purulent   Drainage amount:  Copious   Packing materials:  1/4 in iodoform gauze  Amount 1/4" iodoform:  15cm Post-procedure details:    Patient tolerance of procedure:  Tolerated well, no immediate complications Comments:     Copious amount of foul-smelling purulent drainage expressed. Induration and abscess does not reach the perianal area and is local to the gluteus not extending beyond the crease of the buttock into the perineum. The perineum is soft and without pain or abscess   (including critical care time)  Medications Ordered in ED Medications  sodium chloride 0.9 % bolus 500 mL (0 mLs Intravenous Stopped 03/10/16 1915)  lidocaine-EPINEPHrine (XYLOCAINE W/EPI) 2 %-1:200000 (PF) injection 10 mL (10 mLs Infiltration Given 03/10/16 2037)  clindamycin (CLEOCIN) IVPB 600 mg (600 mg Intravenous New Bag/Given 03/10/16 2038)  oxyCODONE-acetaminophen (PERCOCET/ROXICET) 5-325 MG per tablet 2 tablet (2 tablets Oral Given 03/10/16 2037)     Initial Impression / Assessment and Plan / ED Course  I have reviewed the triage vital signs and the nursing notes.  Pertinent labs & imaging results that were available during my care of the patient were reviewed by me and considered in my medical decision making (see chart for details).  Clinical Course    Final Clinical Impressions(s) / ED Diagnoses   Final diagnoses:  Abscess  Type 2 diabetes mellitus without complication, without long-term current use of insulin (HCC)  Patient has a buttock abscess that is between the anus and the perineum. It neither extends to  the anus nor the perineum. No signs of Fournier gangrene. Incision and drainage produced copious drainage. Patient has had poor glycemic control but does not have an anion gap or acidosis. He is otherwise well without constitutional symptoms. At this time and feel he is safe to continue outpatient treatment with clindamycin and closer monitoring of his blood sugars. He reports having been changed to oral metformin and glipizide. He is to contact his physician this week to discuss his glycemic management and for recheck of the wound. He is also counseled on signs and symptoms for which return to the emergency department and packing change in the emergency department if he is not able to be done in the office.  New Prescriptions New Prescriptions   CLINDAMYCIN (CLEOCIN) 300 MG CAPSULE    Take 1 capsule (300 mg total) by mouth 4 (four) times daily. X 7 days   OXYCODONE-ACETAMINOPHEN (PERCOCET) 5-325 MG TABLET    Take 2 tablets by mouth every 4 (four) hours as needed.     Arby Barrette, MD 03/10/16 2127    Arby Barrette, MD 03/10/16 2129

## 2016-03-13 ENCOUNTER — Ambulatory Visit (INDEPENDENT_AMBULATORY_CARE_PROVIDER_SITE_OTHER): Payer: Self-pay | Admitting: Internal Medicine

## 2016-03-13 ENCOUNTER — Encounter: Payer: Self-pay | Admitting: Internal Medicine

## 2016-03-13 VITALS — BP 160/90 | HR 74 | Temp 98.4°F | Resp 18 | Ht 68.0 in | Wt 227.0 lb

## 2016-03-13 DIAGNOSIS — L0231 Cutaneous abscess of buttock: Secondary | ICD-10-CM

## 2016-03-13 DIAGNOSIS — E118 Type 2 diabetes mellitus with unspecified complications: Secondary | ICD-10-CM

## 2016-03-13 DIAGNOSIS — I10 Essential (primary) hypertension: Secondary | ICD-10-CM

## 2016-03-13 LAB — GLUCOSE, POCT (MANUAL RESULT ENTRY): POC Glucose: 289 mg/dl — AB (ref 70–99)

## 2016-03-13 NOTE — Patient Instructions (Signed)
Get your antibiotic, amlodipine, and glucose test strips  filled at [facility] [address] The rest of your meds are at [facility]

## 2016-03-13 NOTE — Progress Notes (Signed)
Subjective:    Patient ID: Brandon Mcneil, male    DOB: Jan 19, 1981, 35 y.o.   MRN: [id number]  HPI   1.  DM:  States his sugars were better after starting Metformin and Glipizide.  Was only on the meds for 4 days before developed an abscess on his left buttock.  States his sugars had dropped from 500s to 200s.  His sugars went back up when he wasn't feeling well.   Had an I and D of the abscess, with improvement of swelling.  Still with tenderness.  He has been unable to get the Clindamycin prescribed as no money.  2.  Essential Hypertension:  Has not obtained the Amlodipine as well.    3.  Bipolar Disorder:  Long discussion regarding need to go back to [facility] for care.     Current Outpatient Prescriptions:  .  glipiZIDE (GLUCOTROL) 10 MG tablet, Take 1 tablet (10 mg total) by mouth 2 (two) times daily before a meal., Disp: 60 tablet, Rfl: 11 .  metFORMIN (GLUMETZA) 500 MG (MOD) 24 hr tablet, 1 tab by mouth twice daily, Disp: 60 tablet, Rfl: 11 .  amLODipine (NORVASC) 10 MG tablet, Take 1 tablet (10 mg total) by mouth daily. (Patient not taking: Reported on 03/13/2016), Disp: 30 tablet, Rfl: 11 .  aspirin 81 MG tablet, Take 1 tablet (81 mg total) by mouth daily. (Patient not taking: Reported on 03/13/2016), Disp: 30 tablet, Rfl:  .  clindamycin (CLEOCIN) 300 MG capsule, Take 1 capsule (300 mg total) by mouth 4 (four) times daily. X 7 days (Patient not taking: Reported on 03/13/2016), Disp: 28 capsule, Rfl: 0 .  glucose blood test strip, Use as instructed (Patient not taking: Reported on 03/13/2016), Disp: 100 each, Rfl: 12 .  ibuprofen (ADVIL,MOTRIN) 200 MG tablet, Take 200-400 mg by mouth every 6 (six) hours as needed for moderate pain., Disp: , Rfl:  .  oxyCODONE-acetaminophen (PERCOCET) 5-325 MG tablet, Take 2 tablets by mouth every 4 (four) hours as needed. (Patient not taking: Reported on 03/13/2016), Disp: 20 tablet, Rfl: 0   Allergies  Allergen Reactions  . Lisinopril Swelling   Angioedema  . Penicillins Itching and Rash      Review of Systems     Objective:   Physical Exam Lungs: CTA CV:  RRR without murmur or rub, radial pulses normal and equal Abd:  S, NT, No HSM or mass, + BS Left buttock:  Still with some redness and scant pustular drainage form abscess left buttock.       Lab Results  Component Value Date   POCGLU 289 (A) 03/13/2016   Assessment & Plan:  1.  DM:  To keep better track of sugars.  Test strips more affordable at [facility].  Rx sent there.  Would like to have pt. Bring in glucose diary each visit.  A1C recently 11.6%.    2.  Essential Hypertension:  To pick up Amlodipine ASAP at [facility] and get started as well.  3.  Left buttock abscess:  In future, to call clinic if unable to afford med given in ED/urgent care.  Clindamycin sent into [facility] pharmacy.  4.  Bipolar Disorder:  Discussed at length that while some of the meds for bipolar disorder may cause weight gain, elevated sugar or cholesterol, very important to keep his psychiatric illness under control.  To discuss how he might balance this out with his psychiatrist at [facility].   In meantime, to work on lifestyle changes to get  DM, htn under better control.

## 2016-03-18 ENCOUNTER — Ambulatory Visit: Payer: Self-pay | Admitting: Internal Medicine

## 2016-03-25 ENCOUNTER — Ambulatory Visit: Payer: Self-pay | Admitting: Internal Medicine

## 2016-04-20 ENCOUNTER — Other Ambulatory Visit: Payer: Self-pay | Admitting: Internal Medicine

## 2016-04-20 DIAGNOSIS — E118 Type 2 diabetes mellitus with unspecified complications: Secondary | ICD-10-CM

## 2016-05-04 ENCOUNTER — Ambulatory Visit: Payer: Self-pay | Admitting: Internal Medicine

## 2016-05-05 ENCOUNTER — Ambulatory Visit: Payer: Self-pay | Admitting: Internal Medicine

## 2016-05-27 ENCOUNTER — Ambulatory Visit (HOSPITAL_COMMUNITY): Payer: Self-pay | Admitting: Psychiatry

## 2016-06-15 ENCOUNTER — Ambulatory Visit (INDEPENDENT_AMBULATORY_CARE_PROVIDER_SITE_OTHER): Payer: Self-pay | Admitting: Internal Medicine

## 2016-06-15 ENCOUNTER — Encounter: Payer: Self-pay | Admitting: Internal Medicine

## 2016-06-15 VITALS — BP 160/104 | HR 80 | Resp 18 | Ht 68.0 in | Wt 211.0 lb

## 2016-06-15 DIAGNOSIS — E118 Type 2 diabetes mellitus with unspecified complications: Secondary | ICD-10-CM

## 2016-06-15 DIAGNOSIS — F3130 Bipolar disorder, current episode depressed, mild or moderate severity, unspecified: Secondary | ICD-10-CM

## 2016-06-15 DIAGNOSIS — Z23 Encounter for immunization: Secondary | ICD-10-CM

## 2016-06-15 DIAGNOSIS — I10 Essential (primary) hypertension: Secondary | ICD-10-CM

## 2016-06-15 LAB — GLUCOSE, POCT (MANUAL RESULT ENTRY): POC Glucose: 316 mg/dl — AB (ref 70–99)

## 2016-06-15 MED ORDER — METFORMIN HCL ER 500 MG PO TB24: ORAL_TABLET | ORAL | 11 refills | Status: DC

## 2016-06-15 MED ORDER — AGAMATRIX PRESTO W/DEVICE KIT: PACK | 0 refills | Status: DC

## 2016-06-15 MED ORDER — GLUCOSE BLOOD VI STRP: ORAL_STRIP | 12 refills | Status: DC

## 2016-06-15 MED ORDER — GLIPIZIDE 10 MG PO TABS: 10.0000 mg | ORAL_TABLET | Freq: Two times a day (BID) | ORAL | 11 refills | Status: DC

## 2016-06-15 MED ORDER — AMLODIPINE BESYLATE 10 MG PO TABS: 10.0000 mg | ORAL_TABLET | Freq: Every day | ORAL | 11 refills | Status: DC

## 2016-06-15 MED ORDER — METFORMIN HCL ER (MOD) 500 MG PO TB24: ORAL_TABLET | ORAL | 11 refills | Status: DC

## 2016-06-15 NOTE — Progress Notes (Signed)
   Subjective:    Patient ID: Brandon Mcneil, male    DOB: 1981-01-10, 35 y.o.   MRN: [id number]  HPI  Seen once and last in July.  1.  DM Type 2:  Not clear how long he has been off Metformin ER and Glipizide.  Has been thirsty and urinating a lot--actually with incontinence at times when sleeping.   Has lost a lot of weight.  Poor historian.  Sounds like he only filled the Rxs once. A1C mid July was 11.6% Does have an orange card now and [facility]. We do not have any more influenza vaccines and patient has not had one yet this fall.    2.  Essential Hypertension:  Thinks he only filled his Amlodipine once and finished at the beginning of August.  Having palpitations when lies on left side, gradually calms down.  3.  Tobacco Abuse:  Still smoking 1/2 ppd.  Not clear how motivated he is to quit.  Has tried patches before, but smoked through their use.  4.  Left sided sharp headache--for 1 week, there for an instant, then gone  Meds: Using nephew's Metformin ER 500 mg twice daily.  Allergies  Allergen Reactions  . Lisinopril Swelling    Angioedema  . Penicillins Itching and Rash    Immunization History  Administered Date(s) Administered  . Td 05/17/1993   Review of Systems     Objective:   Physical Exam NAD Smells strongly of cigarette smoke HEENT:  PERRL, EOMI, TMs pearly gray, MMM Lungs:  CTA CV:  RRR with normal S1 and S2, No S3, S4 or murmur. Radial and DP pulses normal and equal. LE:  No edema.       Assessment & Plan:  1.  DM Type 2:  A1C.  Discussed he is on 2 meds, not 1 and needs to get on both.  Also to stop drinking juice.  2.  Essential Hypertension:  Restart Amlodipine.  3.  HM:  Tdap and Pneumococcal 23 V. Out of influenza vaccine--to check back in 2 weeks.  4.  Bipolar Disorder:  [facility]our[facility] get this under control as well.

## 2016-06-16 LAB — HGB A1C W/O EAG: Hgb A1c MFr Bld: 13.6 % — ABNORMAL HIGH (ref 4.8–5.6)

## 2016-06-30 NOTE — Progress Notes (Signed)
Patient was informed according to physician notes.

## 2016-07-28 ENCOUNTER — Ambulatory Visit: Payer: Self-pay | Admitting: Internal Medicine

## 2016-09-16 ENCOUNTER — Ambulatory Visit: Payer: Self-pay | Admitting: Internal Medicine

## 2016-09-27 ENCOUNTER — Encounter (HOSPITAL_COMMUNITY): Payer: Self-pay | Admitting: Emergency Medicine

## 2016-09-27 ENCOUNTER — Emergency Department (HOSPITAL_COMMUNITY)
Admission: EM | Admit: 2016-09-27 | Discharge: 2016-09-27 | Disposition: A | Discharge status: Home / Self Care | Payer: Self-pay | Attending: Emergency Medicine | Admitting: Emergency Medicine

## 2016-09-27 DIAGNOSIS — L0231 Cutaneous abscess of buttock: Secondary | ICD-10-CM | POA: Insufficient documentation

## 2016-09-27 DIAGNOSIS — Z7982 Long term (current) use of aspirin: Secondary | ICD-10-CM | POA: Insufficient documentation

## 2016-09-27 DIAGNOSIS — Z7984 Long term (current) use of oral hypoglycemic drugs: Secondary | ICD-10-CM | POA: Insufficient documentation

## 2016-09-27 DIAGNOSIS — F17210 Nicotine dependence, cigarettes, uncomplicated: Secondary | ICD-10-CM | POA: Insufficient documentation

## 2016-09-27 DIAGNOSIS — Z760 Encounter for issue of repeat prescription: Secondary | ICD-10-CM

## 2016-09-27 DIAGNOSIS — I10 Essential (primary) hypertension: Secondary | ICD-10-CM | POA: Insufficient documentation

## 2016-09-27 DIAGNOSIS — E119 Type 2 diabetes mellitus without complications: Secondary | ICD-10-CM | POA: Insufficient documentation

## 2016-09-27 DIAGNOSIS — L0291 Cutaneous abscess, unspecified: Secondary | ICD-10-CM

## 2016-09-27 DIAGNOSIS — R110 Nausea: Secondary | ICD-10-CM

## 2016-09-27 LAB — COMPREHENSIVE METABOLIC PANEL
ALK PHOS: 60 U/L (ref 38–126)
ALT: 13 U/L — ABNORMAL LOW (ref 17–63)
ANION GAP: 11 (ref 5–15)
AST: 15 U/L (ref 15–41)
Albumin: 3.7 g/dL (ref 3.5–5.0)
BUN: 12 mg/dL (ref 6–20)
CO2: 20 mmol/L — AB (ref 22–32)
Calcium: 8.8 mg/dL — ABNORMAL LOW (ref 8.9–10.3)
Chloride: 105 mmol/L (ref 101–111)
Creatinine, Ser: 1.12 mg/dL (ref 0.61–1.24)
GFR calc Af Amer: >60 mL/min (ref >60)
GFR calc non Af Amer: >60 mL/min (ref >60)
Glucose, Bld: 114 mg/dL — ABNORMAL HIGH (ref 65–99)
Potassium: 3.5 mmol/L (ref 3.5–5.1)
SODIUM: 136 mmol/L (ref 135–145)
Total Bilirubin: 0.4 mg/dL (ref 0.3–1.2)
Total Protein: 7.3 g/dL (ref 6.5–8.1)

## 2016-09-27 LAB — CBC
HCT: 39.2 % (ref 39.0–52.0)
Hemoglobin: 14.3 g/dL (ref 13.0–17.0)
MCH: 30 pg (ref 26.0–34.0)
MCHC: 36.5 g/dL — ABNORMAL HIGH (ref 30.0–36.0)
MCV: 82.4 fL (ref 78.0–100.0)
Platelets: 247 10*3/uL (ref 150–400)
RBC: 4.76 MIL/uL (ref 4.22–5.81)
RDW: 13.3 % (ref 11.5–15.5)
WBC: 10.9 10*3/uL — ABNORMAL HIGH (ref 4.0–10.5)

## 2016-09-27 LAB — RAPID HIV SCREEN (HIV 1/2 AB+AG)
HIV 1/2 Antibodies: NONREACTIVE
HIV-1 P24 ANTIGEN - HIV24: NONREACTIVE

## 2016-09-27 LAB — LIPASE, BLOOD: LIPASE: 33 U/L (ref 11–51)

## 2016-09-27 MED ORDER — OMEPRAZOLE 20 MG PO CPDR: 20.0000 mg | DELAYED_RELEASE_CAPSULE | Freq: Every day | ORAL | 0 refills | Status: DC

## 2016-09-27 MED ORDER — LIDOCAINE HCL 2 % IJ SOLN
10.0000 mL | Freq: Once | INTRAMUSCULAR | Status: AC
  Administered 2016-09-27: 200 mg

## 2016-09-27 MED ORDER — GLUCOSE BLOOD VI STRP: ORAL_STRIP | 12 refills | Status: DC

## 2016-09-27 MED ORDER — SULFAMETHOXAZOLE-TRIMETHOPRIM 800-160 MG PO TABS: 1.0000 | ORAL_TABLET | Freq: Two times a day (BID) | ORAL | 0 refills | Status: AC

## 2016-09-27 MED ORDER — HYDROCODONE-ACETAMINOPHEN 5-325 MG PO TABS: 1.0000 | ORAL_TABLET | Freq: Four times a day (QID) | ORAL | 0 refills | Status: DC | PRN

## 2016-09-27 NOTE — ED Notes (Signed)
Provider notified of patients elevated BP. Ok to DC patient per provider

## 2016-09-27 NOTE — ED Triage Notes (Signed)
Pt c/o nausea and abscess to B/L buttocks area. Pt has also been out of his blood sugar strips and blood pressure medications for some time. Pt has been taking another persons blood pressure medication.

## 2016-09-27 NOTE — ED Notes (Signed)
Pt understood dc material and follow up info with primary care. NAD Noted. Scripts given at [facility]

## 2016-09-27 NOTE — Discharge Instructions (Signed)
Please pull the packing material out in 48 hours.  Continue warm compresses.  Follow-up with your doctor in 3 days.

## 2016-09-27 NOTE — ED Provider Notes (Signed)
[place] DEPT Provider Note   CSN: [id number] Arrival date & time: 09/27/16  1701     History   Chief Complaint Chief Complaint  Patient presents with  . Nausea  . Abscess  . Medication Refill    HPI Brandon Mcneil is a 36 y.o. male.  Patient presents to the emergency department with chief complaint of abscess on buttocks. He states that he noticed the abscess about a week ago. It has been progressively worsening. He denies any associated fevers, chills, or vomiting, but he does report having some episodes nausea. He reports his symptoms are worsened with sitting and with palpation.  Additionally, patient complains of having had some epigastric abdominal pain at night. He thinks that he may developed a stomach ulcer. He denies vomiting. Denies any fever. Symptoms are worsened at night. He does not relate his symptoms to eating.  Patient also complains of several painful bumps near the tip his penis. He denies any penile discharge or dysuria. He states that he had sexual relations with new partner, and the bumps appeared shortly thereafter.   The history is provided by the patient. No language interpreter was used.    Past Medical History:  Diagnosis Date  . Depression   . Diabetes ([place])   . Hypercholesteremia   . Hypertension     Patient Active Problem List   Diagnosis Date Noted  . Medication overdose 10/31/2013  . Bipolar I disorder, most recent episode depressed ([place]) 09/11/2013  . Tobacco use disorder 09/11/2013  . Newly diagnosed diabetes ([place]) 09/11/2013  . Dehydration 09/11/2013  . Pain, dental 09/11/2013  . DKA (diabetic ketoacidoses) ([place]) 09/10/2013    History reviewed. No pertinent surgical history.     Home Medications    Prior to Admission medications   Medication Sig Start Date End Date Taking? Authorizing Provider  amLODipine (NORVASC) 10 MG tablet Take 1 tablet (10 mg total) by mouth daily. 06/15/16   Mack Hook, MD  aspirin  81 MG tablet Take 1 tablet (81 mg total) by mouth daily. Patient not taking: Reported on 03/13/2016 02/28/16   Mack Hook, MD  Blood Glucose Monitoring Suppl [facility] PRESTO) w/Device KIT Test glucose twice daiy 06/15/16   Mack Hook, MD  glipiZIDE (GLUCOTROL) 10 MG tablet Take 1 tablet (10 mg total) by mouth 2 (two) times daily before a meal. 06/15/16   Mack Hook, MD  glucose blood (AGAMATRIX PRESTO TEST) test strip Twice daily blood glucose testing 06/15/16   Mack Hook, MD  ibuprofen (ADVIL,MOTRIN) 200 MG tablet Take 200-400 mg by mouth every 6 (six) hours as needed for moderate pain.    Historical Provider, MD  metFORMIN (GLUCOPHAGE-XR) 500 MG 24 hr tablet 1 tab by mouth twice daily with food. 06/15/16   Mack Hook, MD    Family History No family history on file.  Social History Social History  Substance Use Topics  . Smoking status: Current Every Day Smoker    Packs/day: 0.50    Years: 14.00    Types: Cigarettes  . Smokeless tobacco: Never Used     Comment: 1/2 pack a day  . Alcohol use 1.2 oz/week    2 Cans of beer per week     Comment: occasional     Allergies   Lisinopril and Penicillins   Review of Systems Review of Systems  All other systems reviewed and are negative.    Physical Exam Updated Vital Signs BP 127/89   Pulse 92   Temp 98.5 F (  36.9 C) (Oral)   Resp 18   Ht '5\' 8"'  (1.727 m)   Wt 99.8 kg   SpO2 99%   BMI 33.45 kg/m   Physical Exam  Constitutional: He is oriented to person, place, and time. He appears well-developed and well-nourished.  HENT:  Head: Normocephalic and atraumatic.  Eyes: Conjunctivae and EOM are normal. Pupils are equal, round, and reactive to light. Right eye exhibits no discharge. Left eye exhibits no discharge. No scleral icterus.  Neck: Normal range of motion. Neck supple. No JVD present.  Cardiovascular: Normal rate, regular rhythm and normal heart sounds.  Exam reveals no gallop  and no friction rub.   No murmur heard. Pulmonary/Chest: Effort normal and breath sounds normal. No respiratory distress. He has no wheezes. He has no rales. He exhibits no tenderness.  Abdominal: Soft. He exhibits no distension and no mass. There is no tenderness. There is no rebound and no guarding.  Genitourinary:  Genitourinary Comments: 3 x 3 cm pilonidal abscess, no surrounding erythema or cellulitis  No penile discharge, and there are several small lesions near the tip of the penis which look like genital herpes, no other masses or lesions  Musculoskeletal: Normal range of motion. He exhibits no edema or tenderness.  Neurological: He is alert and oriented to person, place, and time.  Skin: Skin is warm and dry.  Psychiatric: He has a normal mood and affect. His behavior is normal. Judgment and thought content normal.  Nursing note and vitals reviewed.    ED Treatments / Results  Labs (all labs ordered are listed, but only abnormal results are displayed) Labs Reviewed  COMPREHENSIVE METABOLIC PANEL - Abnormal; Notable for the following:       Result Value   CO2 20 (*)    Glucose, Bld 114 (*)    Calcium 8.8 (*)    ALT 13 (*)    All other components within normal limits  CBC - Abnormal; Notable for the following:    WBC 10.9 (*)    MCHC 36.5 (*)    All other components within normal limits  LIPASE, BLOOD  URINALYSIS, ROUTINE W REFLEX MICROSCOPIC  RPR  RAPID HIV SCREEN (HIV 1/2 AB+AG)  GC/CHLAMYDIA PROBE AMP ([place]) NOT AT [facility]    EKG  EKG Interpretation None       Radiology No results found. EMERGENCY DEPARTMENT US SOFT TISSUE INTERPRETATION "Study: Limited Soft Tissue Ultrasound"  INDICATIONS: Soft tissue infection Multiple views of the body part were obtained in real-time with a multi-frequency linear probe  PERFORMED BY: Myself IMAGES ARCHIVED?: Yes SIDE:Right  BODY PART:Abdominal wall INTERPRETATION:  Abcess present    Procedures Procedures  (including critical care time) INCISION AND DRAINAGE Performed by: Montine Circle Consent: Verbal consent obtained. Risks and benefits: risks, benefits and alternatives were discussed Type: abscess  Body area: Right buttock/pilonidal  Anesthesia: local infiltration  Incision was made with a scalpel.  Local anesthetic: lidocaine 2% without epinephrine  Anesthetic total: 5 ml  Complexity: complex Blunt dissection to break up loculations  Drainage: purulent  Drainage amount: copious  Packing material: 1/4 in iodoform gauze  Patient tolerance: Patient tolerated the procedure well with no immediate complications.    Medications Ordered in ED Medications  lidocaine (XYLOCAINE) 2 % (with pres) injection 200 mg (200 mg Infiltration Given 09/27/16 1940)     Initial Impression / Assessment and Plan / ED Course  I have reviewed the triage vital signs and the nursing notes.  Pertinent labs &  imaging results that were available during my care of the patient were reviewed by me and considered in my medical decision making (see chart for details).    1. Pilonidal abscess: We will incise and drain in the emergency department. Discharge with antibiotics and pain medicine.  2. STD check/herpes: No evidence of penile discharge. Doubt gonorrhea or chlamydia. Physical exam consistent with genital herpes.  3. Epigastric abdominal pain: Symptoms seem consistent with GERD versus peptic ulcer. Will treat with omeprazole. Laboratory workup is reassuring. Lipase normal. LFTs are nonelevated. Recommend primary care follow-up.  Final Clinical Impressions(s) / ED Diagnoses   Final diagnoses:  Abscess  Nausea  Encounter for medication refill    New Prescriptions New Prescriptions   HYDROCODONE-ACETAMINOPHEN (NORCO/VICODIN) 5-325 MG TABLET    Take 1-2 tablets by mouth every 6 (six) hours as needed.   OMEPRAZOLE (PRILOSEC) 20 MG CAPSULE    Take 1 capsule (20 mg total) by mouth daily.    SULFAMETHOXAZOLE-TRIMETHOPRIM (BACTRIM DS,SEPTRA DS) 800-160 MG TABLET    Take 1 tablet by mouth 2 (two) times daily.     Montine Circle, PA-C 09/27/16 2039    Milton Ferguson, MD 09/27/16 2045

## 2016-09-27 NOTE — ED Notes (Signed)
Delay in lab draw,  PA lancing abscess at this time,

## 2016-09-28 LAB — RPR: RPR: NONREACTIVE

## 2016-09-28 LAB — GC/CHLAMYDIA PROBE AMP (~~LOC~~) NOT AT ARMC
Chlamydia: NEGATIVE
NEISSERIA GONORRHEA: NEGATIVE

## 2016-12-28 ENCOUNTER — Ambulatory Visit: Payer: Self-pay | Admitting: Internal Medicine

## 2017-02-03 ENCOUNTER — Ambulatory Visit (INDEPENDENT_AMBULATORY_CARE_PROVIDER_SITE_OTHER): Payer: Self-pay | Admitting: Internal Medicine

## 2017-02-03 ENCOUNTER — Encounter: Payer: Self-pay | Admitting: Internal Medicine

## 2017-02-03 VITALS — BP 152/110 | HR 84 | Resp 12 | Ht 67.5 in | Wt 232.0 lb

## 2017-02-03 DIAGNOSIS — L0232 Furuncle of buttock: Secondary | ICD-10-CM

## 2017-02-03 DIAGNOSIS — E785 Hyperlipidemia, unspecified: Secondary | ICD-10-CM | POA: Insufficient documentation

## 2017-02-03 DIAGNOSIS — Z598 Other problems related to housing and economic circumstances: Secondary | ICD-10-CM

## 2017-02-03 DIAGNOSIS — Z599 Problem related to housing and economic circumstances, unspecified: Secondary | ICD-10-CM

## 2017-02-03 DIAGNOSIS — E1169 Type 2 diabetes mellitus with other specified complication: Secondary | ICD-10-CM | POA: Insufficient documentation

## 2017-02-03 DIAGNOSIS — E118 Type 2 diabetes mellitus with unspecified complications: Secondary | ICD-10-CM

## 2017-02-03 DIAGNOSIS — Z720 Tobacco use: Secondary | ICD-10-CM

## 2017-02-03 DIAGNOSIS — F3130 Bipolar disorder, current episode depressed, mild or moderate severity, unspecified: Secondary | ICD-10-CM

## 2017-02-03 DIAGNOSIS — I10 Essential (primary) hypertension: Secondary | ICD-10-CM

## 2017-02-03 HISTORY — DX: Essential (primary) hypertension: I10

## 2017-02-03 LAB — GLUCOSE, POCT (MANUAL RESULT ENTRY): POC Glucose: 200 mg/dl — AB (ref 70–99)

## 2017-02-03 MED ORDER — GLUCOSE BLOOD VI STRP: ORAL_STRIP | 3 refills | Status: DC

## 2017-02-03 MED ORDER — DOXYCYCLINE HYCLATE 100 MG PO TABS: 100.0000 mg | ORAL_TABLET | Freq: Two times a day (BID) | ORAL | 0 refills | Status: DC

## 2017-02-03 MED ORDER — ASPIRIN 81 MG PO TABS: 81.0000 mg | ORAL_TABLET | Freq: Every day | ORAL | 3 refills | Status: DC

## 2017-02-03 MED ORDER — AMLODIPINE BESYLATE 10 MG PO TABS: 10.0000 mg | ORAL_TABLET | Freq: Every day | ORAL | 3 refills | Status: DC

## 2017-02-03 MED ORDER — AMLODIPINE BESYLATE 10 MG PO TABS: 10.0000 mg | ORAL_TABLET | Freq: Every day | ORAL | 1 refills | Status: DC

## 2017-02-03 MED ORDER — GLIPIZIDE 10 MG PO TABS: 10.0000 mg | ORAL_TABLET | Freq: Two times a day (BID) | ORAL | 3 refills | Status: DC

## 2017-02-03 MED ORDER — METFORMIN HCL ER 500 MG PO TB24: ORAL_TABLET | ORAL | 3 refills | Status: DC

## 2017-02-03 NOTE — Progress Notes (Signed)
   Subjective:    Patient ID: Brandon Mcneil, male    DOB: 1981/05/04, 36 y.o.   MRN: [id number]  HPI   1.  Depression/Bipolar Disorder ?II?:  Was drinking alcohol maybe 2 beers daily and then one day for a birthday celebration, drank 12 beers in a 24 hour period.  Had a terrible headache the next day, so stopped alcohol "cold [place]"  May 27th.  Has not yet been back to [facility] for treatment of his depression.   Feels like he is a burden on family with transportation and money.  Sleeping okay--but previously was lying in bed until 2 p.m.  Feels rested now and getting up much earlier in the morning.   States he is going to [employer].  2.  DM:  Glucose 200 fasting this morning.  Was out of medication for 2 months or more.  End of April, he started taking his neighbor's medication.  He has been using 20 units of Lantus twice daily.  Has not taken Metformin ER or Glipizide in some time.  3.  Essential Hypertension:  Has not taken Amlodipine.  Is taking his mother's blood pressure pills.  Whatever it is, he is taking twice daily.  Taking HCTZ 25 mg from sounds of it.  He is urinating a lot.    4.  Boils on buttocks that keep recurring.  Seen in ED for this in February.  Has one now.  Current Meds  Medication Sig  . Blood Glucose Monitoring Suppl (AGAMATRIX PRESTO) w/Device KIT Test glucose twice daiy  . glucose blood (AGAMATRIX PRESTO TEST) test strip Twice daily blood glucose testing  . [DISCONTINUED] glucose blood (AGAMATRIX PRESTO TEST) test strip Twice daily blood glucose testing  See medicines listed in narrative  Allergies  Allergen Reactions  . Lisinopril Swelling    Angioedema  . Penicillins Itching and Rash    Review of Systems     Objective:   Physical Exam  Lungs:  CTA CV:  RRR without murmur or rub, radial pulses normal and equal Buttocks:  1 cm furuncle inside medial right buttock with scant white discharge.        Assessment & Plan:  1.  DM:  Called [facility] and downloaded application for patient.  [place], [place], will work with him on getting this filled out and in. Discussed he needs to fill his medication otherwise at [facility] for 1 month and should get his meds shipped if everything goes right within the 1 month time period.  2.  Essential Hypertension:  Filled Amlodipine for 1 year with [facility] and for 2 months at [facility].  As above.  ASA 81 mg also sent to [facility].  3. Depression/Bipolar Disorder:  Ocie Cornfield working with patient to get back to [place] for treatment.  Discussed if he finds necessary, can get counseling here, but would likely be best if where he is obtaining medication.  4.  Buttock Furuncles:  Doxycycline 100 mg twice daily for 10 days.

## 2017-02-03 NOTE — Patient Instructions (Addendum)
Please get your Aml[facility] and test strips [facility].   Once you application to [facility] is in, it usually takes 2-3 weeks for your meds delivered to your home

## 2017-02-05 ENCOUNTER — Other Ambulatory Visit: Payer: Self-pay | Admitting: Licensed Clinical Social Worker

## 2017-02-08 ENCOUNTER — Ambulatory Visit (INDEPENDENT_AMBULATORY_CARE_PROVIDER_SITE_OTHER): Payer: Self-pay | Admitting: Licensed Clinical Social Worker

## 2017-02-08 DIAGNOSIS — Z596 Low income: Secondary | ICD-10-CM

## 2017-02-08 DIAGNOSIS — F439 Reaction to severe stress, unspecified: Secondary | ICD-10-CM

## 2017-02-08 NOTE — Progress Notes (Signed)
Patient ID: JERRIT HOREN, male   DOB: 07-19-1981, 36 y.o.   MRN: [id number]  LCSWA met with Christia Reading for 20 minutes as part of case management and clinical support. Travius presented with euthymic mood and appropriate affect. LCSWA helped Kaeden fill out paperwork to apply for [facility] delivery. LCSWA also helped Tim process the next steps of the application process. LCSWA told Hoke that he would need to go to the post office and mail the application. Soham stated that he did not have the money to mail it. LCSWA helped Janos brainstorm ways that he could obtain the money to mail the application. Jahseh stated he could ask his brother if he could borrow money to mail his application. Ferd stated he would talk to his brother today and ask his mother to drive him to to the post office. Jordy stated to the [place] that he becomes anxious when he is in a car. The LCSWA went over and practiced grounding and breathing techniques withTimothy. Quentyn stated that he does not want to go to [facility] to obtain his medicine because he feels that [facility] does not listen to him;Adael processed options with the [place]. Sandor stated to the [facility] that he was willing to try [facility] one more time.

## 2017-02-11 ENCOUNTER — Telehealth: Payer: Self-pay | Admitting: Licensed Clinical Social Worker

## 2017-02-11 NOTE — Telephone Encounter (Signed)
LCSWA spoke with Brandon Mcneil to make sure he was able to mail his application for [facility]. He stated that his brother was mailing his application for him today.Brandon Mcneil stated to the LCSWA that Brandon Mcneil was able to pick up the most recent prescriptions.Brandon Mcneil stated that he did not have a chance to go to [facility] because his mother's car has been in the shop.

## 2017-04-06 ENCOUNTER — Ambulatory Visit: Payer: Self-pay | Admitting: Internal Medicine

## 2017-05-04 ENCOUNTER — Ambulatory Visit: Payer: Self-pay | Admitting: Internal Medicine

## 2017-05-19 ENCOUNTER — Ambulatory Visit: Payer: Self-pay | Admitting: Internal Medicine

## 2017-06-09 ENCOUNTER — Encounter (HOSPITAL_COMMUNITY): Payer: Self-pay

## 2017-06-09 ENCOUNTER — Emergency Department (HOSPITAL_COMMUNITY)
Admission: EM | Admit: 2017-06-09 | Discharge: 2017-06-09 | Disposition: A | Discharge status: Home / Self Care | Payer: Self-pay | Attending: Physician Assistant | Admitting: Physician Assistant

## 2017-06-09 DIAGNOSIS — Z79899 Other long term (current) drug therapy: Secondary | ICD-10-CM | POA: Insufficient documentation

## 2017-06-09 DIAGNOSIS — E119 Type 2 diabetes mellitus without complications: Secondary | ICD-10-CM | POA: Insufficient documentation

## 2017-06-09 DIAGNOSIS — I10 Essential (primary) hypertension: Secondary | ICD-10-CM | POA: Insufficient documentation

## 2017-06-09 DIAGNOSIS — Z7984 Long term (current) use of oral hypoglycemic drugs: Secondary | ICD-10-CM | POA: Insufficient documentation

## 2017-06-09 DIAGNOSIS — Z7982 Long term (current) use of aspirin: Secondary | ICD-10-CM | POA: Insufficient documentation

## 2017-06-09 DIAGNOSIS — E7800 Pure hypercholesterolemia, unspecified: Secondary | ICD-10-CM | POA: Insufficient documentation

## 2017-06-09 DIAGNOSIS — F17210 Nicotine dependence, cigarettes, uncomplicated: Secondary | ICD-10-CM | POA: Insufficient documentation

## 2017-06-09 DIAGNOSIS — K0889 Other specified disorders of teeth and supporting structures: Secondary | ICD-10-CM | POA: Insufficient documentation

## 2017-06-09 MED ORDER — LIDOCAINE VISCOUS 2 % MT SOLN: 15.0000 mL | OROMUCOSAL | 0 refills | Status: DC | PRN

## 2017-06-09 MED ORDER — CLINDAMYCIN HCL 150 MG PO CAPS: 150.0000 mg | ORAL_CAPSULE | Freq: Four times a day (QID) | ORAL | 0 refills | Status: DC

## 2017-06-09 MED ORDER — GLUCOSE BLOOD VI STRP: ORAL_STRIP | 0 refills | Status: DC

## 2017-06-09 MED ORDER — NAPROXEN 500 MG PO TABS: 500.0000 mg | ORAL_TABLET | Freq: Two times a day (BID) | ORAL | 0 refills | Status: DC

## 2017-06-09 NOTE — ED Provider Notes (Signed)
[facility] DEPT Provider Note   CSN: [id number] Arrival date & time: 06/09/17  5003     History   Chief Complaint Chief Complaint  Patient presents with  . Dental Pain    HPI GIOVONNIE TRETTEL is a 36 y.o. male presents to the emergency department today for left upper molar pain times 3 months.  Patient notes that it broke while he was chewing food 3 months ago.  Since then he has been having pain to this area.  Over the last 2 weeks the pain has become more sensitive to cold liquids.  He  has tried ibuprofen for this with only mild relief. Denies fever, chills, inability to control secretions, N/V, abdominal pain, voice change, or trauma. The patient does not have a dentist.   HPI  Past Medical History:  Diagnosis Date  . Depression   . Diabetes ([place])   . Essential hypertension 02/03/2017  . Hypercholesteremia   . Hypertension     Patient Active Problem List   Diagnosis Date Noted  . Stress 02/08/2017  . Essential hypertension 02/03/2017  . Type 2 diabetes mellitus with complication, without long-term current use of insulin ([place]) 02/03/2017  . Medication overdose 10/31/2013  . Bipolar I disorder, most recent episode depressed ([place]) 09/11/2013  . Tobacco use disorder 09/11/2013  . Newly diagnosed diabetes ([place]) 09/11/2013  . Dehydration 09/11/2013  . Pain, dental 09/11/2013  . DKA (diabetic ketoacidoses) ([place]) 09/10/2013    History reviewed. No pertinent surgical history.     Home Medications    Prior to Admission medications   Medication Sig Start Date End Date Taking? Authorizing Provider  amLODipine (NORVASC) 10 MG tablet Take 1 tablet (10 mg total) by mouth daily. 02/03/17   Mack Hook, MD  aspirin 81 MG tablet Take 1 tablet (81 mg total) by mouth daily. 02/03/17   Mack Hook, MD  Blood Glucose Monitoring Suppl [facility] PRESTO) w/Device KIT Test glucose twice daiy 06/15/16   Mack Hook, MD    doxycycline (VIBRA-TABS) 100 MG tablet Take 1 tablet (100 mg total) by mouth 2 (two) times daily. 02/03/17   Mack Hook, MD  glipiZIDE (GLUCOTROL) 10 MG tablet Take 1 tablet (10 mg total) by mouth 2 (two) times daily before a meal. 02/03/17   Mack Hook, MD  glucose blood (AGAMATRIX PRESTO TEST) test strip Twice daily blood glucose testing 02/03/17   Mack Hook, MD  ibuprofen (ADVIL,MOTRIN) 200 MG tablet Take 200-400 mg by mouth every 6 (six) hours as needed for moderate pain.    [provider]  metFORMIN (GLUCOPHAGE-XR) 500 MG 24 hr tablet 1 tab by mouth twice daily with food. 02/03/17   Mack Hook, MD  omeprazole (PRILOSEC) 20 MG capsule Take 1 capsule (20 mg total) by mouth daily. Patient not taking: Reported on 02/03/2017 09/27/16   Montine Circle, PA-C    Family History History reviewed. No pertinent family history.  Social History Social History  Substance Use Topics  . Smoking status: Current Every Day Smoker    Packs/day: 0.50    Years: 14.00    Types: Cigarettes  . Smokeless tobacco: Never Used     Comment: 1/2 pack a day  . Alcohol use 1.2 oz/week    2 Cans of beer per week     Comment: occasional     Allergies   Lisinopril and Penicillins   Review of Systems Review of Systems  Constitutional: Negative for chills and fever.  HENT: Positive for dental problem.  Negative for drooling, sore throat and trouble swallowing.   Cardiovascular: Negative for chest pain.  Gastrointestinal: Negative for nausea and vomiting.  Neurological: Negative for headaches.     Physical Exam Updated Vital Signs BP (!) 152/91 (BP Location: Left Arm)   Pulse (!) 101   Temp 98.4 F (36.9 C) (Oral)   Resp 18   Ht _0  (1.727 m)   Wt 107.2 kg (236 lb 4.8 oz)   SpO2 96%   BMI 35.93 kg/m   Physical Exam  Constitutional: He appears well-developed and well-nourished. No distress.  HENT:  Head: Normocephalic and atraumatic.  Right Ear:  Hearing, tympanic membrane, external ear and ear canal normal. No foreign bodies. Tympanic membrane is not injected, not perforated, not erythematous, not retracted and not bulging.  Left Ear: Hearing, tympanic membrane, external ear and ear canal normal. No foreign bodies. Tympanic membrane is not injected, not perforated, not erythematous, not retracted and not bulging.  Nose: Nose normal. No mucosal edema, rhinorrhea, sinus tenderness, septal deviation or nasal septal hematoma.  No foreign bodies. Right sinus exhibits no maxillary sinus tenderness and no frontal sinus tenderness. Left sinus exhibits no maxillary sinus tenderness and no frontal sinus tenderness.  The patient has normal phonation and is in control of secretions. No stridor.  Midline uvula without edema. Soft palate rises symmetrically. No tonsillar erythema or exudates. No PTA. Tongue protrusion is normal. No trismus. No creptius on neck palpation. No floor edema. Patient has multiple broken teeth tender to percussion. No gingival erythema or fluctuance noted. Mucus membranes moist. No facial swelling.   Eyes: Pupils are equal, round, and reactive to light. Conjunctivae, EOM and lids are normal. Right eye exhibits no discharge. Left eye exhibits no discharge. Right conjunctiva is not injected. Left conjunctiva is not injected.  Neck: Trachea normal, normal range of motion, full passive range of motion without pain and phonation normal. Neck supple. No spinous process tenderness and no muscular tenderness present. No neck rigidity. No tracheal deviation and normal range of motion present.  Cardiovascular: Normal rate and regular rhythm.   Pulmonary/Chest: Effort normal and breath sounds normal. No stridor. He has no wheezes.  Lymphadenopathy:       Head (right side): No submental, no submandibular, no tonsillar, no preauricular, no posterior auricular and no occipital adenopathy present.       Head (left side): No submental, no  submandibular, no tonsillar, no preauricular, no posterior auricular and no occipital adenopathy present.    He has no cervical adenopathy.  Neurological: He is alert.  Skin: Skin is warm and dry. No rash noted.  Psychiatric: He has a normal mood and affect.  Nursing note and vitals reviewed.    ED Treatments / Results  Labs (all labs ordered are listed, but only abnormal results are displayed) Labs Reviewed - No data to display  EKG  EKG Interpretation None       Radiology No results found.  Procedures Procedures (including critical care time)  Medications Ordered in ED Medications - No data to display   Initial Impression / Assessment and Plan / ED Course  I have reviewed the triage vital signs and the nursing notes.  Pertinent labs & imaging results that were available during my care of the patient were reviewed by me and considered in my medical decision making (see chart for details).     Patient with toothache.  No gross abscess.  Exam unconcerning for Ludwig's angina or spread of infection.  Will  treat with clindamycin as patient has PCN allergy. Will give viscous lidocaine and anti-inflammatories medicine for pain.  Return precautions discussed. Urged patient to follow-up with dentist.    Final Clinical Impressions(s) / ED Diagnoses   Final diagnoses:  Pain, dental    New Prescriptions Discharge Medication List as of 06/09/2017  8:26 PM    START taking these medications   Details  clindamycin (CLEOCIN) 150 MG capsule Take 1 capsule (150 mg total) by mouth every 6 (six) hours., Starting Wed 06/09/2017, Print    lidocaine (XYLOCAINE) 2 % solution Use as directed 15 mLs in the mouth or throat as needed for mouth pain., Starting Wed 06/09/2017, Print    naproxen (NAPROSYN) 500 MG tablet Take 1 tablet (500 mg total) by mouth 2 (two) times daily., Starting Wed 06/09/2017, Print         Jillyn Ledger, PA-C 06/09/17 2345    Macarthur Critchley,  MD 06/12/17 [phone]

## 2017-06-09 NOTE — ED Triage Notes (Signed)
Patient presents with left upper molar dental pain, starting 3 months ago "when I was eating some crunch n' munch and got a nut." Patient reports "my tooth cracked, and my crown may have fallen off, Im not sure." Patient denies seeing a dentist since then. Patient reports in the past 2 weeks, his pain and sensitivity to the site has increased.  Patient also requests his throat be checked as he "has had a spell of dry throat and spitting up spit for the past 3 months when I smoke a cigarette." Patient denies fever/chills/sore throat/ nasal congestion/headache.

## 2017-06-09 NOTE — ED Notes (Signed)
Discharge instructions reviewed with patient. Patient verbalizes understanding. VSS.   

## 2017-06-09 NOTE — Discharge Instructions (Signed)
Please read and follow all provided instructions.  Your diagnoses today include:  1. Pain, dental     The exam and treatment you received today has been provided on an emergency basis only. This is not a substitute for complete medical or dental care. This problem will not resolve on its own without the care of a dentist.  Tests performed today include: 1. Vital signs. See below for your results today.   Medications prescribed:   Take any prescribed medications only as directed. Use ibuprofen or naproxen for pain. Use the viscous lidocaine for mouth pain. Swish with the lidocaine and spit it out. Do not swallow it.  Please take all of your antibiotics until finished!   You may develop abdominal discomfort or diarrhea from the antibiotic.  You may help offset this with probiotics which you can buy or get in yogurt. Do not eat or take the probiotics until 2 hours after your antibiotic. Do not take your medicine if develop an itchy rash, swelling in your mouth or lips, or difficulty breathing.    Home care instructions:  Follow any educational materials contained in this packet.  Follow-up instructions: Please follow-up with your dentist for further evaluation of your symptoms.   Please Contact this number: [phone] to get into contact with an emergency dental service to schedule an appointment with a local dentist.   Dental Assistance: See below for dental referrals  Return instructions:  1. Please return to the Emergency Department if you experience worsening symptoms. 2. Please return if you develop a fever, you develop more swelling in your face or neck, you have trouble breathing or swallowing food. 3. Please return if you have any other emergent concerns.  Additional Information:  Your vital signs today were: BP (!) 152/91 (BP Location: Left Arm)    Pulse (!) 101    Temp 98.4 F (36.9 C) (Oral)    Resp 18    Ht 5\' 8"  (1.727 m)    Wt 107.2 kg (236 lb 4.8 oz)    SpO2 96%     BMI 35.93 kg/m  If your blood pressure (BP) was elevated above 135/85 this visit, please have this repeated by your doctor within one month. --------------

## 2017-06-16 ENCOUNTER — Ambulatory Visit: Payer: Self-pay | Admitting: Internal Medicine

## 2017-07-20 ENCOUNTER — Ambulatory Visit: Payer: Self-pay | Admitting: Internal Medicine

## 2017-08-05 ENCOUNTER — Telehealth: Payer: Self-pay | Admitting: Internal Medicine

## 2017-08-05 NOTE — Telephone Encounter (Signed)
Patient called stating needs a Rx on all Medications. [facility] [address] Please Advise.

## 2017-08-06 NOTE — Telephone Encounter (Signed)
Patient called asking for the status on his application for [facility] and will like to know if [facility] [address] [facility] he requested yesterday.  Please advise.

## 2017-08-16 NOTE — Telephone Encounter (Signed)
Spoke with patient. [facility]

## 2017-08-18 ENCOUNTER — Encounter (HOSPITAL_COMMUNITY): Payer: Self-pay | Admitting: Emergency Medicine

## 2017-08-18 ENCOUNTER — Other Ambulatory Visit: Payer: Self-pay

## 2017-08-18 DIAGNOSIS — K047 Periapical abscess without sinus: Secondary | ICD-10-CM | POA: Insufficient documentation

## 2017-08-18 DIAGNOSIS — Z7982 Long term (current) use of aspirin: Secondary | ICD-10-CM | POA: Insufficient documentation

## 2017-08-18 DIAGNOSIS — E119 Type 2 diabetes mellitus without complications: Secondary | ICD-10-CM | POA: Insufficient documentation

## 2017-08-18 DIAGNOSIS — F17210 Nicotine dependence, cigarettes, uncomplicated: Secondary | ICD-10-CM | POA: Insufficient documentation

## 2017-08-18 DIAGNOSIS — Z880 Allergy status to penicillin: Secondary | ICD-10-CM | POA: Insufficient documentation

## 2017-08-18 DIAGNOSIS — Z79899 Other long term (current) drug therapy: Secondary | ICD-10-CM | POA: Insufficient documentation

## 2017-08-18 DIAGNOSIS — I10 Essential (primary) hypertension: Secondary | ICD-10-CM | POA: Insufficient documentation

## 2017-08-18 DIAGNOSIS — Z794 Long term (current) use of insulin: Secondary | ICD-10-CM | POA: Insufficient documentation

## 2017-08-18 LAB — CBG MONITORING, ED: GLUCOSE-CAPILLARY: 193 mg/dL — AB (ref 65–99)

## 2017-08-18 NOTE — ED Triage Notes (Signed)
Pt to ED via [facility] with c/o dental abscess on left upper side.  Pt has swelling to left side of face.  Pt was seen for same in 10/18 but did not get Rx''s filled for same.   Pt also out of his HTN meds.

## 2017-08-19 ENCOUNTER — Emergency Department (HOSPITAL_COMMUNITY)
Admission: EM | Admit: 2017-08-19 | Discharge: 2017-08-19 | Disposition: A | Discharge status: Home / Self Care | Payer: Self-pay | Attending: Emergency Medicine | Admitting: Emergency Medicine

## 2017-08-19 DIAGNOSIS — K047 Periapical abscess without sinus: Secondary | ICD-10-CM

## 2017-08-19 DIAGNOSIS — I10 Essential (primary) hypertension: Secondary | ICD-10-CM

## 2017-08-19 LAB — CBC WITH DIFFERENTIAL/PLATELET
Basophils Absolute: 0 10*3/uL (ref 0.0–0.1)
Basophils Relative: 0 %
Eosinophils Absolute: 0.1 10*3/uL (ref 0.0–0.7)
Eosinophils Relative: 1 %
HCT: 38.1 % — ABNORMAL LOW (ref 39.0–52.0)
HEMOGLOBIN: 13.6 g/dL (ref 13.0–17.0)
LYMPHS PCT: 30 %
Lymphs Abs: 2.3 10*3/uL (ref 0.7–4.0)
MCH: 29.2 pg (ref 26.0–34.0)
MCHC: 35.7 g/dL (ref 30.0–36.0)
MCV: 81.9 fL (ref 78.0–100.0)
MONO ABS: 1.1 10*3/uL — AB (ref 0.1–1.0)
Monocytes Relative: 14 %
Neutro Abs: 4.2 10*3/uL (ref 1.7–7.7)
Neutrophils Relative %: 55 %
Platelets: 230 10*3/uL (ref 150–400)
RBC: 4.65 MIL/uL (ref 4.22–5.81)
RDW: 13.6 % (ref 11.5–15.5)
WBC: 7.8 10*3/uL (ref 4.0–10.5)

## 2017-08-19 LAB — COMPREHENSIVE METABOLIC PANEL
ALBUMIN: 3.7 g/dL (ref 3.5–5.0)
ALK PHOS: 75 U/L (ref 38–126)
ALT: 24 U/L (ref 17–63)
AST: 24 U/L (ref 15–41)
Anion gap: 7 (ref 5–15)
BILIRUBIN TOTAL: 0.7 mg/dL (ref 0.3–1.2)
BUN: 8 mg/dL (ref 6–20)
CALCIUM: 8.8 mg/dL — AB (ref 8.9–10.3)
CO2: 25 mmol/L (ref 22–32)
Chloride: 102 mmol/L (ref 101–111)
Creatinine, Ser: 0.86 mg/dL (ref 0.61–1.24)
GFR calc non Af Amer: >60 mL/min (ref >60)
GLUCOSE: 171 mg/dL — AB (ref 65–99)
POTASSIUM: 3.6 mmol/L (ref 3.5–5.1)
Sodium: 134 mmol/L — ABNORMAL LOW (ref 135–145)
Total Protein: 7.8 g/dL (ref 6.5–8.1)

## 2017-08-19 MED ORDER — AMLODIPINE BESYLATE 5 MG PO TABS
10.0000 mg | ORAL_TABLET | Freq: Once | ORAL | Status: AC
  Administered 2017-08-19: 10 mg via ORAL
  Filled 2017-08-19: qty 2

## 2017-08-19 MED ORDER — CLINDAMYCIN HCL 150 MG PO CAPS
300.0000 mg | ORAL_CAPSULE | Freq: Once | ORAL | Status: AC
Start: 2017-08-19 — End: 2017-08-19
  Administered 2017-08-19: 300 mg via ORAL
  Filled 2017-08-19: qty 2

## 2017-08-19 MED ORDER — CLINDAMYCIN HCL 150 MG PO CAPS: 300.0000 mg | ORAL_CAPSULE | Freq: Three times a day (TID) | ORAL | 0 refills | Status: DC

## 2017-08-19 MED ORDER — AMLODIPINE BESYLATE 10 MG PO TABS: 10.0000 mg | ORAL_TABLET | Freq: Every day | ORAL | 0 refills | Status: DC

## 2017-08-19 MED ORDER — NAPROXEN 500 MG PO TABS: 500.0000 mg | ORAL_TABLET | Freq: Two times a day (BID) | ORAL | 0 refills | Status: DC

## 2017-08-19 NOTE — ED Provider Notes (Signed)
[place] EMERGENCY DEPARTMENT Provider Note   CSN: [id number] Arrival date & time: 08/18/17  2317     History   Chief Complaint Chief Complaint  Patient presents with  . Abscess    HPI Brandon Mcneil is a 37 y.o. male.  The history is provided by the patient and medical records.  Abscess     37 year old male with history of depression, diabetes, hypertension, hyperlipidemia, presenting to the ED with left upper dental pain.  Patient reports he feels like he has ongoing dental abscess.  He was seen here for same recently, however waited too long to get his antibiotics filled and pharmacy would no longer fill it.  States swelling has not gotten any worse but has also not improved.  He denies any difficulty swallowing.  Pain with chewing on the affected side.  No fever or chills.  States he is not currently established with a dentist.  Of note, patient's blood pressure here is high.  Reports he ran out of his Norvasc about 3 days ago.  Has not been in contact with his doctor for refill.  He denies any headache, chest pain, shortness of breath, dizziness, weakness, or other associated symptoms.  Past Medical History:  Diagnosis Date  . Depression   . Diabetes ([place])   . Essential hypertension 02/03/2017  . Hypercholesteremia   . Hypertension     Patient Active Problem List   Diagnosis Date Noted  . Stress 02/08/2017  . Essential hypertension 02/03/2017  . Type 2 diabetes mellitus with complication, without long-term current use of insulin ([place]) 02/03/2017  . Medication overdose 10/31/2013  . Bipolar I disorder, most recent episode depressed ([place]) 09/11/2013  . Tobacco use disorder 09/11/2013  . Newly diagnosed diabetes ([place]) 09/11/2013  . Dehydration 09/11/2013  . Pain, dental 09/11/2013  . DKA (diabetic ketoacidoses) ([place]) 09/10/2013    History reviewed. No pertinent surgical history.     Home Medications    Prior to Admission medications     Medication Sig Start Date End Date Taking? Authorizing Provider  amLODipine (NORVASC) 10 MG tablet Take 1 tablet (10 mg total) by mouth daily. 02/03/17  Yes Mack Hook, MD  aspirin 81 MG tablet Take 1 tablet (81 mg total) by mouth daily. 02/03/17  Yes Mack Hook, MD  Blood Glucose Monitoring Suppl (AGAMATRIX PRESTO) w/Device KIT Test glucose twice daiy 06/15/16  Yes Mack Hook, MD  glipiZIDE (GLUCOTROL) 10 MG tablet Take 1 tablet (10 mg total) by mouth 2 (two) times daily before a meal. 02/03/17  Yes Mack Hook, MD  glucose blood (AGAMATRIX PRESTO TEST) test strip Twice daily blood glucose testing 02/03/17  Yes Mack Hook, MD  glucose blood test strip Use as instructed 06/09/17  Yes Maczis, Barth Kirks, PA-C  ibuprofen (ADVIL,MOTRIN) 200 MG tablet Take 200-400 mg by mouth every 6 (six) hours as needed for moderate pain.   Yes [provider]  metFORMIN (GLUCOPHAGE-XR) 500 MG 24 hr tablet 1 tab by mouth twice daily with food. 02/03/17  Yes Mack Hook, MD    Family History No family history on file.  Social History Social History   Tobacco Use  . Smoking status: Current Every Day Smoker    Packs/day: 0.50    Years: 14.00    Pack years: 7.00    Types: Cigarettes  . Smokeless tobacco: Never Used  . Tobacco comment: 1/2 pack a day  Substance Use Topics  . Alcohol use: Yes    Alcohol/week: 1.2  oz    Types: 2 Cans of beer per week    Comment: occasional  . Drug use: No    Comment: MJ former user.  Last use in September 2017     Allergies   Lisinopril and Penicillins   Review of Systems Review of Systems  HENT: Positive for dental problem.   All other systems reviewed and are negative.    Physical Exam Updated Vital Signs BP (!) 182/106 (BP Location: Right Arm) Comment: RN notified  Pulse 67   Temp 98.8 F (37.1 C) (Oral)   Resp 16   Ht _0  (1.727 m)   Wt 106.6 kg (235 lb)   SpO2 98%   BMI 35.73 kg/m    Physical Exam  Constitutional: He is oriented to person, place, and time. He appears well-developed and well-nourished.  HENT:  Head: Normocephalic and atraumatic.  Mouth/Throat: Oropharynx is clear and moist.  Teeth largely in very poor dentition,  Diffuse dental decay; teeth 13, 14, and 15 are all broken and nearly flush with gum line , surrounding gingiva very swollen and inflamed, handling secretions appropriately, no trismus, some swelling noted to left upper cheek without extension into the neck, normal phonation without stridor  Eyes: Conjunctivae and EOM are normal. Pupils are equal, round, and reactive to light.  Neck: Normal range of motion.  Cardiovascular: Normal rate, regular rhythm and normal heart sounds.  Pulmonary/Chest: Effort normal and breath sounds normal. No stridor. No respiratory distress.  Abdominal: Soft. Bowel sounds are normal. There is no tenderness. There is no rebound.  Musculoskeletal: Normal range of motion.  Neurological: He is alert and oriented to person, place, and time.  Skin: Skin is warm and dry.  Psychiatric: He has a normal mood and affect.  Nursing note and vitals reviewed.    ED Treatments / Results  Labs (all labs ordered are listed, but only abnormal results are displayed) Labs Reviewed  CBC WITH DIFFERENTIAL/PLATELET - Abnormal; Notable for the following components:      Result Value   HCT 38.1 (*)    Monocytes Absolute 1.1 (*)    All other components within normal limits  COMPREHENSIVE METABOLIC PANEL - Abnormal; Notable for the following components:   Sodium 134 (*)    Glucose, Bld 171 (*)    Calcium 8.8 (*)    All other components within normal limits  CBG MONITORING, ED - Abnormal; Notable for the following components:   Glucose-Capillary 193 (*)    All other components within normal limits    EKG  EKG Interpretation None       Radiology No results found.  Procedures Procedures (including critical care  time)  Medications Ordered in ED Medications  amLODipine (NORVASC) tablet 10 mg (10 mg Oral Given 08/19/17 0517)  clindamycin (CLEOCIN) capsule 300 mg (300 mg Oral Given 08/19/17 0517)     Initial Impression / Assessment and Plan / ED Course  I have reviewed the triage vital signs and the nursing notes.  Pertinent labs & imaging results that were available during my care of the patient were reviewed by me and considered in my medical decision making (see chart for details).  37 year old male here with left upper dental pain.  Has had dental abscess for a few weeks now.  Seen here for same previously but never got antibiotics filled.  He is afebrile and nontoxic.  He has overall poor dentition with generalized decay.  Teeth #13, 14, 15 are all broken along the gum  line.  Surrounding gingiva appears swollen and irritated.  There is no drainable fluid collection at this time.  Has some mild swelling of the left upper cheek but no extension into the neck.  Handling secretions well.  Normal phonation without stridor.  No signs or symptoms concerning for Ludwig's angina.  Will start on antibiotics and referred to dentist.  First dose of clindamycin given here.  Patient also with elevated blood pressure here.  Has been out of his home medications for 3 days now.  Has not called his PCP for refill.  He denies any chest pain, shortness of breath, headache, dizziness, numbness, weakness, or other neurologic symptoms.  Screening labs overall reassuring.  Do not suspect any acute endorgan damage at this time.  Given dose of home norvasc here.    Encouraged close follow-up with PCP.  Discussed plan with patient, he acknowledged understanding and agreed with plan of care.  Return precautions given for new or worsening symptoms.  Final Clinical Impressions(s) / ED Diagnoses   Final diagnoses:  Dental abscess  Essential hypertension    ED Discharge Orders        Ordered    clindamycin (CLEOCIN) 150 MG  capsule  3 times daily     08/19/17 0603    amLODipine (NORVASC) 10 MG tablet  Daily     08/19/17 0603    naproxen (NAPROSYN) 500 MG tablet  2 times daily with meals     08/19/17 0603       Larene Pickett, PA-C 08/19/17 0612    Ward, Delice Bison, DO 08/19/17 [phone]

## 2017-08-19 NOTE — Discharge Instructions (Signed)
Take the prescribed medication as directed. Follow-up with dentist-- can see one of the clinics on resource guide or you may find your own. Follow-up with your primary care doctor. Return to the ED for new or worsening symptoms.

## 2017-09-16 ENCOUNTER — Ambulatory Visit: Payer: Self-pay | Admitting: Internal Medicine

## 2017-09-30 ENCOUNTER — Other Ambulatory Visit: Payer: Self-pay

## 2017-09-30 ENCOUNTER — Telehealth: Payer: Self-pay | Admitting: Internal Medicine

## 2017-09-30 MED ORDER — METFORMIN HCL ER 500 MG PO TB24: ORAL_TABLET | ORAL | 0 refills | Status: DC

## 2017-09-30 MED ORDER — GLIPIZIDE 10 MG PO TABS: 10.0000 mg | ORAL_TABLET | Freq: Two times a day (BID) | ORAL | 0 refills | Status: DC

## 2017-09-30 MED ORDER — AMLODIPINE BESYLATE 10 MG PO TABS: 10.0000 mg | ORAL_TABLET | Freq: Every day | ORAL | 0 refills | Status: DC

## 2017-09-30 NOTE — Telephone Encounter (Signed)
Patient called stating needs a Rx on Amlodipine, Metformin and Glipizide. [facility] Please advise.

## 2017-09-30 NOTE — Telephone Encounter (Signed)
Rx's sent to pharmacy for 30 days. Patient has follow up scheduled for 3/19

## 2017-10-18 ENCOUNTER — Ambulatory Visit: Payer: Self-pay | Admitting: Internal Medicine

## 2017-11-01 ENCOUNTER — Telehealth: Payer: Self-pay | Admitting: Internal Medicine

## 2017-11-01 NOTE — Telephone Encounter (Signed)
To Dr. Mulberry for further direction 

## 2017-11-01 NOTE — Telephone Encounter (Signed)
Patient stated that he spoke with Dr. Delrae AlfredMulberry at his last visit and informed her that he needed a letter written to Social Services stating that he is unable to work.  Patient needs this letter to be written so that he can receive his food stamps.  Patient can be reached [phone].

## 2017-11-02 NOTE — Telephone Encounter (Signed)
I have not seen him since June of 2018 and there is no documentation of that conversation, nor do I remember.  He would need to make an appointment to discuss

## 2017-11-02 NOTE — Telephone Encounter (Signed)
Spoke with patient. Informed he needs appointment. Patient verbalized understanding. States he will need to renew his orange card first and he will be doing that on 11/04/17 and when he finishes he will come over and schedule appointment.

## 2017-11-23 ENCOUNTER — Ambulatory Visit: Payer: Self-pay | Admitting: Internal Medicine

## 2017-12-13 ENCOUNTER — Ambulatory Visit: Payer: Self-pay | Admitting: Internal Medicine

## 2018-02-08 ENCOUNTER — Ambulatory Visit: Payer: Self-pay | Admitting: Internal Medicine

## 2018-03-11 ENCOUNTER — Encounter: Payer: Self-pay | Admitting: Internal Medicine

## 2018-03-11 ENCOUNTER — Ambulatory Visit: Payer: Self-pay | Admitting: Internal Medicine

## 2018-04-01 ENCOUNTER — Other Ambulatory Visit: Payer: Self-pay

## 2018-04-01 MED ORDER — METFORMIN HCL ER 500 MG PO TB24: ORAL_TABLET | ORAL | 0 refills | Status: DC

## 2018-04-01 MED ORDER — GLIPIZIDE 10 MG PO TABS: 10.0000 mg | ORAL_TABLET | Freq: Two times a day (BID) | ORAL | 0 refills | Status: DC

## 2018-04-08 ENCOUNTER — Telehealth: Payer: Self-pay

## 2018-04-08 NOTE — Telephone Encounter (Addendum)
Patient was called to move appointment up from 11:30 am on Tuesday 04/12/18 to 10:30 am. Patient then asked to reschedule appointment. Patient states he does not have any money right now to pay for visit and asked for appointment to be reschduled. Informed patient I would speak with Dr. Delrae AlfredMulberry regarding rescheduling appointment because patient had been told previously that he could not miss anymore appointment. patient states he has started a new job and should have money after the first of the month. Patient has had several no shows or cancellations. Patient diabetic medication was refilled on 04/01/18 for 30 days with no refills due to patient non compliance. Patient was informed then he would not receive anymore refills until he is seen in office. Patient verbalized understanding at that time. Informed will let him know on Monday about future appointment. Patient again verbalized understanding. Patient was also informed about outstanding balance and making arrangements to pay off balance.

## 2018-04-12 ENCOUNTER — Ambulatory Visit: Payer: Self-pay | Admitting: Internal Medicine

## 2018-04-17 HISTORY — PX: INCISION AND DRAINAGE ABSCESS: SHX5864

## 2018-04-26 ENCOUNTER — Emergency Department (HOSPITAL_COMMUNITY)
Admission: EM | Admit: 2018-04-26 | Discharge: 2018-04-26 | Disposition: A | Discharge status: Home / Self Care | Payer: Self-pay | Attending: Emergency Medicine | Admitting: Emergency Medicine

## 2018-04-26 ENCOUNTER — Other Ambulatory Visit: Payer: Self-pay

## 2018-04-26 ENCOUNTER — Encounter (HOSPITAL_COMMUNITY): Payer: Self-pay | Admitting: *Deleted

## 2018-04-26 DIAGNOSIS — F17210 Nicotine dependence, cigarettes, uncomplicated: Secondary | ICD-10-CM | POA: Insufficient documentation

## 2018-04-26 DIAGNOSIS — I10 Essential (primary) hypertension: Secondary | ICD-10-CM | POA: Insufficient documentation

## 2018-04-26 DIAGNOSIS — Z79899 Other long term (current) drug therapy: Secondary | ICD-10-CM | POA: Insufficient documentation

## 2018-04-26 DIAGNOSIS — E1165 Type 2 diabetes mellitus with hyperglycemia: Secondary | ICD-10-CM | POA: Insufficient documentation

## 2018-04-26 DIAGNOSIS — R739 Hyperglycemia, unspecified: Secondary | ICD-10-CM

## 2018-04-26 DIAGNOSIS — L0231 Cutaneous abscess of buttock: Secondary | ICD-10-CM | POA: Insufficient documentation

## 2018-04-26 DIAGNOSIS — Z7984 Long term (current) use of oral hypoglycemic drugs: Secondary | ICD-10-CM | POA: Insufficient documentation

## 2018-04-26 DIAGNOSIS — L0291 Cutaneous abscess, unspecified: Secondary | ICD-10-CM

## 2018-04-26 DIAGNOSIS — Z7982 Long term (current) use of aspirin: Secondary | ICD-10-CM | POA: Insufficient documentation

## 2018-04-26 LAB — CBC WITH DIFFERENTIAL/PLATELET
Abs Immature Granulocytes: 0 10*3/uL (ref 0.0–0.1)
Basophils Absolute: 0 10*3/uL (ref 0.0–0.1)
Basophils Relative: 0 %
Eosinophils Absolute: 0.1 10*3/uL (ref 0.0–0.7)
Eosinophils Relative: 1 %
HCT: 43.7 % (ref 39.0–52.0)
Hemoglobin: 15.8 g/dL (ref 13.0–17.0)
Immature Granulocytes: 0 %
Lymphocytes Relative: 15 %
Lymphs Abs: 1.6 10*3/uL (ref 0.7–4.0)
MCH: 30.4 pg (ref 26.0–34.0)
MCHC: 36.2 g/dL — ABNORMAL HIGH (ref 30.0–36.0)
MCV: 84.2 fL (ref 78.0–100.0)
Monocytes Absolute: 0.8 10*3/uL (ref 0.1–1.0)
Monocytes Relative: 8 %
Neutro Abs: 8.3 10*3/uL — ABNORMAL HIGH (ref 1.7–7.7)
Neutrophils Relative %: 76 %
Platelets: 217 10*3/uL (ref 150–400)
RBC: 5.19 MIL/uL (ref 4.22–5.81)
RDW: 12.5 % (ref 11.5–15.5)
WBC: 10.8 10*3/uL — ABNORMAL HIGH (ref 4.0–10.5)

## 2018-04-26 LAB — BASIC METABOLIC PANEL
Anion gap: 14 (ref 5–15)
BUN: 11 mg/dL (ref 6–20)
CO2: 21 mmol/L — ABNORMAL LOW (ref 22–32)
Calcium: 9.6 mg/dL (ref 8.9–10.3)
Chloride: 99 mmol/L (ref 98–111)
Creatinine, Ser: 0.95 mg/dL (ref 0.61–1.24)
GFR calc Af Amer: >60 mL/min (ref >60)
GFR calc non Af Amer: >60 mL/min (ref >60)
Glucose, Bld: 313 mg/dL — ABNORMAL HIGH (ref 70–99)
Potassium: 4.3 mmol/L (ref 3.5–5.1)
Sodium: 134 mmol/L — ABNORMAL LOW (ref 135–145)

## 2018-04-26 MED ORDER — SULFAMETHOXAZOLE-TRIMETHOPRIM 800-160 MG PO TABS: 1.0000 | ORAL_TABLET | Freq: Two times a day (BID) | ORAL | 0 refills | Status: AC

## 2018-04-26 MED ORDER — INSULIN ASPART 100 UNIT/ML ~~LOC~~ SOLN
6.0000 [IU] | Freq: Once | SUBCUTANEOUS | Status: AC
  Administered 2018-04-26: 6 [IU] via SUBCUTANEOUS
  Filled 2018-04-26: qty 1

## 2018-04-26 MED ORDER — LIDOCAINE-EPINEPHRINE (PF) 2 %-1:200000 IJ SOLN
20.0000 mL | Freq: Once | INTRAMUSCULAR | Status: AC
  Administered 2018-04-26: 20 mL
  Filled 2018-04-26: qty 20

## 2018-04-26 MED ORDER — METFORMIN HCL ER 500 MG PO TB24: ORAL_TABLET | ORAL | 0 refills | Status: DC

## 2018-04-26 MED ORDER — AMLODIPINE BESYLATE 10 MG PO TABS: 10.0000 mg | ORAL_TABLET | Freq: Every day | ORAL | 0 refills | Status: DC

## 2018-04-26 MED ORDER — INSULIN ASPART 100 UNIT/ML ~~LOC~~ SOLN: 6.0000 [IU] | Freq: Once | SUBCUTANEOUS | Status: DC

## 2018-04-26 MED ORDER — OXYCODONE-ACETAMINOPHEN 5-325 MG PO TABS: 1.0000 | ORAL_TABLET | Freq: Four times a day (QID) | ORAL | 0 refills | Status: DC | PRN

## 2018-04-26 MED ORDER — OXYCODONE-ACETAMINOPHEN 5-325 MG PO TABS
1.0000 | ORAL_TABLET | Freq: Once | ORAL | Status: AC
  Administered 2018-04-26: 1 via ORAL
  Filled 2018-04-26: qty 1

## 2018-04-26 NOTE — Discharge Instructions (Addendum)
Please read attached information. If you experience any new or worsening signs or symptoms please return to the emergency room for evaluation. Please follow-up with your primary care provider or specialist as discussed. Please use medication prescribed only as directed and discontinue taking if you have any concerning signs or symptoms.  Please remove packing in 24 hours.

## 2018-04-26 NOTE — ED Triage Notes (Signed)
Pt in c/o abscess to his buttocks that he wants opened up, history of same, unsure of fever at home

## 2018-04-26 NOTE — ED Provider Notes (Signed)
[facility] EMERGENCY DEPARTMENT Provider Note   CSN: [id number] Arrival date & time: 04/26/18  1041     History   Chief Complaint Chief Complaint  Patient presents with  . Abscess    HPI Brandon Mcneil is a 37 y.o. male.  HPI   37 year old male presents today with complaints of abscess.  He has a significant past medical history of the same, also has a past medical history of diabetes.  He notes a history of diabetes, recently his blood sugar readings have been in the 300 range, he is increased his dose of metformin.  Patient notes the abscess started to worsen last night, uncertain when it originally started.  Patient notes swelling in the left gluteal region about the gluteal fold.  Patient notes he has felt hot, no objective fevers.  No rectal discharge or bloody stools.  Past Medical History:  Diagnosis Date  . Depression   . Diabetes ([place])   . Essential hypertension 02/03/2017  . Hypercholesteremia   . Hypertension     Patient Active Problem List   Diagnosis Date Noted  . Stress 02/08/2017  . Essential hypertension 02/03/2017  . Type 2 diabetes mellitus with complication, without long-term current use of insulin ([place]) 02/03/2017  . Medication overdose 10/31/2013  . Bipolar I disorder, most recent episode depressed ([place]) 09/11/2013  . Tobacco use disorder 09/11/2013  . Newly diagnosed diabetes ([place]) 09/11/2013  . Dehydration 09/11/2013  . Pain, dental 09/11/2013  . DKA (diabetic ketoacidoses) ([place]) 09/10/2013    History reviewed. No pertinent surgical history.      Home Medications    Prior to Admission medications   Medication Sig Start Date End Date Taking? Authorizing Provider  amLODipine (NORVASC) 10 MG tablet Take 1 tablet (10 mg total) by mouth daily. 04/26/18   Shyler Holzman, Dellis Filbert, PA-C  aspirin 81 MG tablet Take 1 tablet (81 mg total) by mouth daily. 02/03/17   Mack Hook, MD  Blood Glucose Monitoring Suppl [facility]  PRESTO) w/Device KIT Test glucose twice daiy 06/15/16   Mack Hook, MD  clindamycin (CLEOCIN) 150 MG capsule Take 2 capsules (300 mg total) by mouth 3 (three) times daily. May dispense as 149m capsules 08/19/17   SBaird Cancer LVilinda Blanks PA-C  glipiZIDE (GLUCOTROL) 10 MG tablet Take 1 tablet (10 mg total) by mouth 2 (two) times daily before a meal. 04/01/18   MMack Hook MD  glucose blood (AGAMATRIX PRESTO TEST) test strip Twice daily blood glucose testing 02/03/17   MMack Hook MD  glucose blood test strip Use as instructed 06/09/17   Maczis, MBarth Kirks PA-C  ibuprofen (ADVIL,MOTRIN) 200 MG tablet Take 200-400 mg by mouth every 6 (six) hours as needed for moderate pain.    [provider]  metFORMIN (GLUCOPHAGE-XR) 500 MG 24 hr tablet 1 tab by mouth twice daily with food. 04/26/18   Shalanda Brogden, JDellis Filbert PA-C  naproxen (NAPROSYN) 500 MG tablet Take 1 tablet (500 mg total) by mouth 2 (two) times daily with a meal. 08/19/17   SLarene Pickett PA-C  oxyCODONE-acetaminophen (PERCOCET/ROXICET) 5-325 MG tablet Take 1 tablet by mouth every 6 (six) hours as needed for severe pain. 04/26/18   Najmo Pardue, JDellis Filbert PA-C  sulfamethoxazole-trimethoprim (BACTRIM DS,SEPTRA DS) 800-160 MG tablet Take 1 tablet by mouth 2 (two) times daily for 7 days. 04/26/18 05/03/18  HOkey Regal PA-C    Family History History reviewed. No pertinent family history.  Social History Social History   Tobacco Use  . Smoking status: Current  Every Day Smoker    Packs/day: 0.50    Years: 14.00    Pack years: 7.00    Types: Cigarettes  . Smokeless tobacco: Never Used  . Tobacco comment: 1/2 pack a day  Substance Use Topics  . Alcohol use: Yes    Alcohol/week: 2.0 standard drinks    Types: 2 Cans of beer per week    Comment: occasional  . Drug use: No    Types: Marijuana    Comment: MJ former user.  Last use in September 2017     Allergies   Lisinopril and Penicillins   Review of Systems Review of  Systems  All other systems reviewed and are negative.    Physical Exam Updated Vital Signs BP (!) 162/108 (BP Location: Left Arm)   Pulse 90   Temp 98.7 F (37.1 C) (Oral)   Resp 17   SpO2 97%   Physical Exam  Constitutional: He is oriented to person, place, and time. He appears well-developed and well-nourished.  HENT:  Head: Normocephalic and atraumatic.  Eyes: Pupils are equal, round, and reactive to light. Conjunctivae are normal. Right eye exhibits no discharge. Left eye exhibits no discharge. No scleral icterus.  Neck: Normal range of motion. No JVD present. No tracheal deviation present.  Pulmonary/Chest: Effort normal. No stridor.  Musculoskeletal:  3 cm abscess to right medial gluteal fold- fluctuance noted - no discharge   Neurological: He is alert and oriented to person, place, and time. Coordination normal.  Psychiatric: He has a normal mood and affect. His behavior is normal. Judgment and thought content normal.  Nursing note and vitals reviewed.    ED Treatments / Results  Labs (all labs ordered are listed, but only abnormal results are displayed) Labs Reviewed  CBC WITH DIFFERENTIAL/PLATELET - Abnormal; Notable for the following components:      Result Value   WBC 10.8 (*)    MCHC 36.2 (*)    Neutro Abs 8.3 (*)    All other components within normal limits  BASIC METABOLIC PANEL - Abnormal; Notable for the following components:   Sodium 134 (*)    CO2 21 (*)    Glucose, Bld 313 (*)    All other components within normal limits    EKG None  Radiology No results found.  Procedures .Marland KitchenIncision and Drainage Date/Time: 04/26/2018 4:43 PM Performed by: Okey Regal, PA-C Authorized by: Okey Regal, PA-C   Consent:    Consent obtained:  Verbal   Consent given by:  Patient   Risks discussed:  Incomplete drainage, pain, infection, bleeding and damage to other organs   Alternatives discussed:  No treatment, delayed treatment and alternative  treatment Location:    Type:  Abscess   Size:  3   Location: Left gluteus. Pre-procedure details:    Skin preparation:  Betadine Anesthesia (see MAR for exact dosages):    Anesthesia method:  Local infiltration   Local anesthetic:  Lidocaine 2% WITH epi Procedure type:    Complexity:  Simple Procedure details:    Needle aspiration: no     Incision types:  Single straight   Incision depth:  Dermal   Scalpel blade:  11   Wound management:  Probed and deloculated and irrigated with saline   Drainage:  Purulent   Drainage amount:  Copious   Wound treatment:  Wound left open   Packing materials:  1/2 in gauze Post-procedure details:    Patient tolerance of procedure:  Tolerated well, no immediate complications   (  including critical care time)  Medications Ordered in ED Medications  oxyCODONE-acetaminophen (PERCOCET/ROXICET) 5-325 MG per tablet 1 tablet (1 tablet Oral Given 04/26/18 1334)  lidocaine-EPINEPHrine (XYLOCAINE W/EPI) 2 %-1:200000 (PF) injection 20 mL (20 mLs Infiltration Given 04/26/18 1340)  insulin aspart (novoLOG) injection 6 Units (6 Units Subcutaneous Given 04/26/18 1523)     Initial Impression / Assessment and Plan / ED Course  I have reviewed the triage vital signs and the nursing notes.  Pertinent labs & imaging results that were available during my care of the patient were reviewed by me and considered in my medical decision making (see chart for details).     Labs: CBC, BMP  Imaging:  Consults:  Therapeutics: Lidocaine with epinephrine, Percocet, insulin  Discharge Meds: Bactrim, metformin, Percocet, amlodipine  Assessment/Plan: 35 YOM presents today with abscess.  This appears to be localized, no spread into the perineum or rectal region.  I&D successful, patient tolerated procedure well.  He will be placed on antibiotics, he was even a dose of insulin here as his blood sugar was slightly elevated, no signs of DKA.  He will be given refill of his  hypertensive medication as he was hypertensive, metformin, encouraged follow-up closely with his primary care for close management of his chronic conditions.  Patient will remove packing on his own, returning with any new or worsening signs or symptoms, he verbalized understanding and agreement to today's plan had no further questions concerns the time discharge.    Final Clinical Impressions(s) / ED Diagnoses   Final diagnoses:  Abscess  Hyperglycemia    ED Discharge Orders         Ordered    sulfamethoxazole-trimethoprim (BACTRIM DS,SEPTRA DS) 800-160 MG tablet  2 times daily     04/26/18 1548    oxyCODONE-acetaminophen (PERCOCET/ROXICET) 5-325 MG tablet  Every 6 hours PRN     04/26/18 1548    metFORMIN (GLUCOPHAGE-XR) 500 MG 24 hr tablet     04/26/18 1548    amLODipine (NORVASC) 10 MG tablet  Daily     04/26/18 1548           Okey Regal, PA-C 04/26/18 1645    Tegeler, Gwenyth Allegra, MD 04/26/18 2243

## 2018-04-26 NOTE — ED Notes (Signed)
Pt states that he has been "grooming" his genitalia with a razor. States sitz baths have worked well for him in the past to assist with draining the abscesses.

## 2018-06-22 ENCOUNTER — Other Ambulatory Visit: Payer: Self-pay | Admitting: Internal Medicine

## 2018-08-04 ENCOUNTER — Other Ambulatory Visit: Payer: Self-pay | Admitting: Internal Medicine

## 2018-08-16 ENCOUNTER — Telehealth: Payer: Self-pay

## 2018-08-16 NOTE — Telephone Encounter (Signed)
Patient called stating he needs refills on all his medications. Patient has not been seen in over a year and patient still owes outstanding balance of $99.00. patient has been informed on several occasions that his balance has to be paid in full before in future appointments can be made and patient has had several no shows. Patient has verbalized understanding that he can not miss another appointment or he will be dismissed from the practice.  Patient states he has been taking someone else medication and he knows he needs to be seen. States he will not be able to pay until the middle of January. Per Estefania. Patient must pay past due of $99 and the $60 fee due to his orange card being expired. Patient understands we can not refill medication without proper follow up. Patient states he will call back when he can pay all fees. Verbalized understanding that he needs to have at least $170 with him just in case labs are ordered being they are a additional $10-$20 fee.

## 2018-10-24 ENCOUNTER — Ambulatory Visit (INDEPENDENT_AMBULATORY_CARE_PROVIDER_SITE_OTHER): Payer: Self-pay | Admitting: Primary Care

## 2018-10-24 ENCOUNTER — Other Ambulatory Visit: Payer: Self-pay

## 2018-10-24 ENCOUNTER — Encounter (INDEPENDENT_AMBULATORY_CARE_PROVIDER_SITE_OTHER): Payer: Self-pay | Admitting: Primary Care

## 2018-10-24 VITALS — BP 140/85 | HR 77 | Temp 97.9°F | Ht 68.0 in | Wt 212.0 lb

## 2018-10-24 DIAGNOSIS — I10 Essential (primary) hypertension: Secondary | ICD-10-CM

## 2018-10-24 DIAGNOSIS — E118 Type 2 diabetes mellitus with unspecified complications: Secondary | ICD-10-CM

## 2018-10-24 DIAGNOSIS — F17200 Nicotine dependence, unspecified, uncomplicated: Secondary | ICD-10-CM

## 2018-10-24 DIAGNOSIS — Z7689 Persons encountering health services in other specified circumstances: Secondary | ICD-10-CM

## 2018-10-24 DIAGNOSIS — E119 Type 2 diabetes mellitus without complications: Secondary | ICD-10-CM

## 2018-10-24 LAB — POCT GLYCOSYLATED HEMOGLOBIN (HGB A1C): Hemoglobin A1C: 13.3 % — AB (ref 4.0–5.6)

## 2018-10-24 LAB — GLUCOSE, POCT (MANUAL RESULT ENTRY): POC Glucose: 283 mg/dl — AB (ref 70–99)

## 2018-10-24 MED ORDER — GLIPIZIDE 10 MG PO TABS: 10.0000 mg | ORAL_TABLET | Freq: Two times a day (BID) | ORAL | 3 refills | Status: DC

## 2018-10-24 MED ORDER — INSULIN GLARGINE 100 UNIT/ML SOLOSTAR PEN: 20.0000 [IU] | PEN_INJECTOR | Freq: Every day | SUBCUTANEOUS | 99 refills | Status: DC

## 2018-10-24 MED ORDER — LOSARTAN POTASSIUM 50 MG PO TABS: 50.0000 mg | ORAL_TABLET | Freq: Every day | ORAL | 3 refills | Status: DC

## 2018-10-24 MED ORDER — METFORMIN HCL 1000 MG PO TABS: 1000.0000 mg | ORAL_TABLET | Freq: Two times a day (BID) | ORAL | 3 refills | Status: DC

## 2018-10-24 MED ORDER — ATORVASTATIN CALCIUM 20 MG PO TABS: 20.0000 mg | ORAL_TABLET | Freq: Every day | ORAL | 3 refills | Status: DC

## 2018-10-24 MED ORDER — AMLODIPINE BESYLATE 10 MG PO TABS: 10.0000 mg | ORAL_TABLET | Freq: Every day | ORAL | 3 refills | Status: DC

## 2018-10-24 MED ORDER — CLONIDINE HCL 0.1 MG PO TABS
0.1000 mg | ORAL_TABLET | Freq: Once | ORAL | Status: AC
  Administered 2018-10-24: 0.1 mg via ORAL

## 2018-10-24 NOTE — Patient Instructions (Signed)
Hypertension Hypertension, commonly called high blood pressure, is when the force of blood pumping through the arteries is too strong. The arteries are the blood vessels that carry blood from the heart throughout the body. Hypertension forces the heart to work harder to pump blood and may cause arteries to become narrow or stiff. Having untreated or uncontrolled hypertension can cause heart attacks, strokes, kidney disease, and other problems. A blood pressure reading consists of a higher number over a lower number. Ideally, your blood pressure should be below 120/80. The first ("top") number is called the systolic pressure. It is a measure of the pressure in your arteries as your heart beats. The second ("bottom") number is called the diastolic pressure. It is a measure of the pressure in your arteries as the heart relaxes. What are the causes? The cause of this condition is not known. What increases the risk? Some risk factors for high blood pressure are under your control. Others are not. Factors you can change  Smoking.  Having type 2 diabetes mellitus, high cholesterol, or both.  Not getting enough exercise or physical activity.  Being overweight.  Having too much fat, sugar, calories, or salt (sodium) in your diet.  Drinking too much alcohol. Factors that are difficult or impossible to change  Having chronic kidney disease.  Having a family history of high blood pressure.  Age. Risk increases with age.  Race. You may be at higher risk if you are African-American.  Gender. Men are at higher risk than women before age 45. After age 65, women are at higher risk than men.  Having obstructive sleep apnea.  Stress. What are the signs or symptoms? Extremely high blood pressure (hypertensive crisis) may cause:  Headache.  Anxiety.  Shortness of breath.  Nosebleed.  Nausea and vomiting.  Severe chest pain.  Jerky movements you cannot control (seizures). How is this  diagnosed? This condition is diagnosed by measuring your blood pressure while you are seated, with your arm resting on a surface. The cuff of the blood pressure monitor will be placed directly against the skin of your upper arm at the level of your heart. It should be measured at least twice using the same arm. Certain conditions can cause a difference in blood pressure between your right and left arms. Certain factors can cause blood pressure readings to be lower or higher than normal (elevated) for a short period of time:  When your blood pressure is higher when you are in a health care provider's office than when you are at home, this is called white coat hypertension. Most people with this condition do not need medicines.  When your blood pressure is higher at home than when you are in a health care provider's office, this is called masked hypertension. Most people with this condition may need medicines to control blood pressure. If you have a high blood pressure reading during one visit or you have normal blood pressure with other risk factors:  You may be asked to return on a different day to have your blood pressure checked again.  You may be asked to monitor your blood pressure at home for 1 week or longer. If you are diagnosed with hypertension, you may have other blood or imaging tests to help your health care provider understand your overall risk for other conditions. How is this treated? This condition is treated by making healthy lifestyle changes, such as eating healthy foods, exercising more, and reducing your alcohol intake. Your health care provider   may prescribe medicine if lifestyle changes are not enough to get your blood pressure under control, and if:  Your systolic blood pressure is above 130.  Your diastolic blood pressure is above 80. Your personal target blood pressure may vary depending on your medical conditions, your age, and other factors. Follow these instructions  at home: Eating and drinking   Eat a diet that is high in fiber and potassium, and low in sodium, added sugar, and fat. An example eating plan is called the DASH (Dietary Approaches to Stop Hypertension) diet. To eat this way: ? Eat plenty of fresh fruits and vegetables. Try to fill half of your plate at each meal with fruits and vegetables. ? Eat whole grains, such as whole wheat pasta, brown rice, or whole grain bread. Fill about one quarter of your plate with whole grains. ? Eat or drink low-fat dairy products, such as skim milk or low-fat yogurt. ? Avoid fatty cuts of meat, processed or cured meats, and poultry with skin. Fill about one quarter of your plate with lean proteins, such as fish, chicken without skin, beans, eggs, and tofu. ? Avoid premade and processed foods. These tend to be higher in sodium, added sugar, and fat.  Reduce your daily sodium intake. Most people with hypertension should eat less than 1,500 mg of sodium a day.  Limit alcohol intake to no more than 1 drink a day for nonpregnant women and 2 drinks a day for men. One drink equals 12 oz of beer, 5 oz of wine, or 1 oz of hard liquor. Lifestyle   Work with your health care provider to maintain a healthy body weight or to lose weight. Ask what an ideal weight is for you.  Get at least 30 minutes of exercise that causes your heart to beat faster (aerobic exercise) most days of the week. Activities may include walking, swimming, or biking.  Include exercise to strengthen your muscles (resistance exercise), such as pilates or lifting weights, as part of your weekly exercise routine. Try to do these types of exercises for 30 minutes at least 3 days a week.  Do not use any products that contain nicotine or tobacco, such as cigarettes and e-cigarettes. If you need help quitting, ask your health care provider.  Monitor your blood pressure at home as told by your health care provider.  Keep all follow-up visits as told by  your health care provider. This is important. Medicines  Take over-the-counter and prescription medicines only as told by your health care provider. Follow directions carefully. Blood pressure medicines must be taken as prescribed.  Do not skip doses of blood pressure medicine. Doing this puts you at risk for problems and can make the medicine less effective.  Ask your health care provider about side effects or reactions to medicines that you should watch for. Contact a health care provider if:  You think you are having a reaction to a medicine you are taking.  You have headaches that keep coming back (recurring).  You feel dizzy.  You have swelling in your ankles.  You have trouble with your vision. Get help right away if:  You develop a severe headache or confusion.  You have unusual weakness or numbness.  You feel faint.  You have severe pain in your chest or abdomen.  You vomit repeatedly.  You have trouble breathing. Summary  Hypertension is when the force of blood pumping through your arteries is too strong. If this condition is not controlled, it   may put you at risk for serious complications.  Your personal target blood pressure may vary depending on your medical conditions, your age, and other factors. For most people, a normal blood pressure is less than 120/80.  Hypertension is treated with lifestyle changes, medicines, or a combination of both. Lifestyle changes include weight loss, eating a healthy, low-sodium diet, exercising more, and limiting alcohol. This information is not intended to replace advice given to you by your health care provider. Make sure you discuss any questions you have with your health care provider. Document Released: 08/03/2005 Document Revised: 07/01/2016 Document Reviewed: 07/01/2016 Elsevier Interactive Patient Education  2019 [employer] Diabetes Basics  Diabetes (diabetes mellitus) is a long-term (chronic) disease. It occurs when  the body does not properly use sugar (glucose) that is released from food after you eat. Diabetes may be caused by one or both of these problems:  Your pancreas does not make enough of a hormone called insulin.  Your body does not react in a normal way to insulin that it makes. Insulin lets sugars (glucose) go into cells in your body. This gives you energy. If you have diabetes, sugars cannot get into cells. This causes high blood sugar (hyperglycemia). Follow these instructions at home: How is diabetes treated? You may need to take insulin or other diabetes medicines daily to keep your blood sugar in balance. Take your diabetes medicines every day as told by your doctor. List your diabetes medicines here: Diabetes medicines  Name of medicine: ______________________________ ? Amount (dose): _______________ Time (a.m./p.m.): _______________ Notes: ___________________________________  Name of medicine: ______________________________ ? Amount (dose): _______________ Time (a.m./p.m.): _______________ Notes: ___________________________________  Name of medicine: ______________________________ ? Amount (dose): _______________ Time (a.m./p.m.): _______________ Notes: ___________________________________ If you use insulin, you will learn how to give yourself insulin by injection. You may need to adjust the amount based on the food that you eat. List the types of insulin you use here: Insulin  Insulin type: ______________________________ ? Amount (dose): _______________ Time (a.m./p.m.): _______________ Notes: ___________________________________  Insulin type: ______________________________ ? Amount (dose): _______________ Time (a.m./p.m.): _______________ Notes: ___________________________________  Insulin type: ______________________________ ? Amount (dose): _______________ Time (a.m./p.m.): _______________ Notes: ___________________________________  Insulin type:  ______________________________ ? Amount (dose): _______________ Time (a.m./p.m.): _______________ Notes: ___________________________________  Insulin type: ______________________________ ? Amount (dose): _______________ Time (a.m./p.m.): _______________ Notes: ___________________________________ How do I manage my blood sugar?  Check your blood sugar levels using a blood glucose monitor as directed by your doctor. Your doctor will set treatment goals for you. Generally, you should have these blood sugar levels:  Before meals (preprandial): 80-130 mg/dL (8.4-6.9 mmol/L).  After meals (postprandial): below 180 mg/dL (10 mmol/L).  A1c level: less than 7%. Write down the times that you will check your blood sugar levels: Blood sugar checks  Time: _______________ Notes: ___________________________________  Time: _______________ Notes: ___________________________________  Time: _______________ Notes: ___________________________________  Time: _______________ Notes: ___________________________________  Time: _______________ Notes: ___________________________________  Time: _______________ Notes: ___________________________________  What do I need to know about low blood sugar? Low blood sugar is called hypoglycemia. This is when blood sugar is at or below 70 mg/dL (3.9 mmol/L). Symptoms may include:  Feeling: ? Hungry. ? Worried or nervous (anxious). ? Sweaty and clammy. ? Confused. ? Dizzy. ? Sleepy. ? Sick to your stomach (nauseous).  Having: ? A fast heartbeat. ? A headache. ? A change in your vision. ? Tingling or no feeling (numbness) around the mouth, lips, or tongue. ? Jerky movements that you cannot control (seizure).  Having trouble with: ? Moving (coordination). ? Sleeping. ? Passing out (fainting). ? Getting upset easily (irritability). Treating low blood sugar To treat low blood sugar, eat or drink something sugary right away. If you can think clearly and  swallow safely, follow the 15:15 rule:  Take 15 grams of a fast-acting carb (carbohydrate). Talk with your doctor about how much you should take.  Some fast-acting carbs are: ? Sugar tablets (glucose pills). Take 3-4 glucose pills. ? 6-8 pieces of hard candy. ? 4-6 oz (120-150 mL) of fruit juice. ? 4-6 oz (120-150 mL) of regular (not diet) soda. ? 1 Tbsp (15 mL) honey or sugar.  Check your blood sugar 15 minutes after you take the carb.  If your blood sugar is still at or below 70 mg/dL (3.9 mmol/L), take 15 grams of a carb again.  If your blood sugar does not go above 70 mg/dL (3.9 mmol/L) after 3 tries, get help right away.  After your blood sugar goes back to normal, eat a meal or a snack within 1 hour. Treating very low blood sugar If your blood sugar is at or below 54 mg/dL (3 mmol/L), you have very low blood sugar (severe hypoglycemia). This is an emergency. Do not wait to see if the symptoms will go away. Get medical help right away. Call your local emergency services (911 in the [place] Do not drive yourself to the hospital. Questions to ask your health care provider  Do I need to meet with a diabetes educator?  What equipment will I need to care for myself at home?  What diabetes medicines do I need? When should I take them?  How often do I need to check my blood sugar?  What number can I call if I have questions?  When is my next doctor's visit?  Where can I find a support group for people with diabetes? Where to find more information  American Diabetes Association: www.diabetes.org  American Association of Diabetes Educators: www.diabeteseducator.org/patient-resources Contact a doctor if:  Your blood sugar is at or above 240 mg/dL (76.7 mmol/L) for 2 days in a row.  You have been sick or have had a fever for 2 days or more, and you are not getting better.  You have any of these problems for more than 6 hours: ? You cannot eat or drink. ? You feel sick to your  stomach (nauseous). ? You throw up (vomit). ? You have watery poop (diarrhea). Get help right away if:  Your blood sugar is lower than 54 mg/dL (3 mmol/L).  You get confused.  You have trouble: ? Thinking clearly. ? Breathing. Summary  Diabetes (diabetes mellitus) is a long-term (chronic) disease. It occurs when the body does not properly use sugar (glucose) that is released from food after digestion.  Take insulin and diabetes medicines as told.  Check your blood sugar every day, as often as told.  Keep all follow-up visits as told by your doctor. This is important. This information is not intended to replace advice given to you by your health care provider. Make sure you discuss any questions you have with your health care provider. Document Released: 11/05/2017 Document Revised: 01/24/2018 Document Reviewed: 11/05/2017 [employer] Interactive Patient Education  2019 [employer].

## 2018-10-24 NOTE — Progress Notes (Addendum)
Established Patient Office Visit  Subjective:  Patient ID: Brandon Mcneil, male    DOB: [birth year]  Age: 38 y.o. MRN: [id number]  CC:  Chief Complaint  Patient presents with  . New Patient (Initial Visit)    DM    Brandon Mcneil presents to establish care. He has been previously followed by Dr. Amil Amen. Unable to afford visits and transferee care.  Past Medical History:  Diagnosis Date  . Depression   . Diabetes ([place])   . Essential hypertension 02/03/2017  . Hypercholesteremia   . Hypertension     History reviewed. No pertinent surgical history.  History reviewed. No pertinent family history.  Social History   Socioeconomic History  . Marital status: Single    Spouse name: Not on file  . Number of children: 3  . Years of education: 10th  . Highest education level: Not on file  Occupational History  . Occupation: unemployed  Social Needs  . Financial resource strain: Not on file  . Food insecurity:    Worry: Not on file    Inability: Not on file  . Transportation needs:    Medical: Not on file    Non-medical: Not on file  Tobacco Use  . Smoking status: Current Every Day Smoker    Packs/day: 0.50    Years: 14.00    Pack years: 7.00    Types: Cigarettes  . Smokeless tobacco: Never Used  . Tobacco comment: 1/2 pack a day  Substance and Sexual Activity  . Alcohol use: Yes    Alcohol/week: 2.0 standard drinks    Types: 2 Cans of beer per week    Comment: occasional  . Drug use: No    Types: Marijuana    Comment: MJ former user.  Last use in September 2017  . Sexual activity: Yes    Partners: Female    Birth control/protection: None  Lifestyle  . Physical activity:    Days per week: Not on file    Minutes per session: Not on file  . Stress: Not on file  Relationships  . Social connections:    Talks on phone: Not on file    Gets together: Not on file    Attends religious service: Not on file    Active member of club or organization: Not on  file    Attends meetings of clubs or organizations: Not on file    Relationship status: Not on file  . Intimate partner violence:    Fear of current or ex partner: Not on file    Emotionally abused: Not on file    Physically abused: Not on file    Forced sexual activity: Not on file  Other Topics Concern  . 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    Outpatient Medications Prior to Visit  Medication Sig Dispense Refill  . aspirin 81 MG tablet Take 1 tablet (81 mg total) by mouth daily. 90 tablet 3  . ibuprofen (ADVIL,MOTRIN) 200 MG tablet Take 200-400 mg by mouth every 6 (six) hours as needed for moderate pain.    . metFORMIN (GLUCOPHAGE-XR) 500 MG 24 hr tablet 1 tab by mouth twice daily with food. 60 tablet 0  . Blood Glucose Monitoring Suppl (AGAMATRIX PRESTO) w/Device KIT Test glucose twice daiy 1 kit 0  . glucose blood (AGAMATRIX PRESTO  TEST) test strip Twice daily blood glucose testing 200 each 3  . glucose blood test strip Use as instructed 100 each 0  . amLODipine (NORVASC) 10 MG tablet Take 1 tablet (10 mg total) by mouth daily. (Patient not taking: Reported on 10/24/2018) 30 tablet 0  . clindamycin (CLEOCIN) 150 MG capsule Take 2 capsules (300 mg total) by mouth 3 (three) times daily. May dispense as 158m capsules 60 capsule 0  . glipiZIDE (GLUCOTROL) 10 MG tablet Take 1 tablet (10 mg total) by mouth 2 (two) times daily before a meal. (Patient not taking: Reported on 10/24/2018) 60 tablet 0  . naproxen (NAPROSYN) 500 MG tablet Take 1 tablet (500 mg total) by mouth 2 (two) times daily with a meal. 30 tablet 0  . oxyCODONE-acetaminophen (PERCOCET/ROXICET) 5-325 MG tablet Take 1 tablet by mouth every 6 (six) hours as needed for severe pain. 6 tablet 0   No facility-administered medications prior to visit.     Allergies  Allergen Reactions  . Lisinopril  Swelling    Angioedema  . Penicillins Itching and Rash    ROS Review of Systems  Constitutional: Negative.   HENT: Positive for dental problem.   Eyes: Positive for visual disturbance.  Respiratory: Negative.   Cardiovascular: Negative.   Gastrointestinal: Negative.   Endocrine: Positive for polydipsia, polyphagia and polyuria.  Genitourinary: Negative.   Musculoskeletal: Negative.   Skin: Negative.       Objective:    Physical Exam  Constitutional: He is oriented to person, place, and time. He appears well-developed and well-nourished.  HENT:  Head: Normocephalic.  Eyes: Pupils are equal, round, and reactive to light. EOM are normal.  Neck: Normal range of motion. Neck supple.  Cardiovascular: Normal rate and regular rhythm.  Pulmonary/Chest: Effort normal and breath sounds normal.  Abdominal: Soft. Bowel sounds are normal. He exhibits distension.  Musculoskeletal: Normal range of motion.  Neurological: He is alert and oriented to person, place, and time.  Skin: Skin is warm and dry.  Psychiatric: He has a normal mood and affect.    BP 140/85 (BP Location: Left Arm, Patient Position: Sitting, Cuff Size: Normal)   Pulse 77   Temp 97.9 F (36.6 C) (Oral)   Ht '5\' 8"'  (1.727 m)   Wt 212 lb (96.2 kg)   SpO2 93%   BMI 32.23 kg/m  Wt Readings from Last 3 Encounters:  10/24/18 212 lb (96.2 kg)  08/18/17 235 lb (106.6 kg)  06/09/17 236 lb 4.8 oz (107.2 kg)     Health Maintenance Due  Topic Date Due  . FOOT EXAM  12/09/1988  . OPHTHALMOLOGY EXAM  12/09/1988  . URINE MICROALBUMIN  12/09/1988  . HIV Screening  12/09/1993  . HEMOGLOBIN A1C  12/14/2016    There are no preventive care reminders to display for this patient.  No results found for: TSH Lab Results  Component Value Date   WBC 10.8 (H) 04/26/2018   HGB 15.8 04/26/2018   HCT 43.7 04/26/2018   MCV 84.2 04/26/2018   PLT 217 04/26/2018   Lab Results  Component Value Date   NA 134 (L) 04/26/2018   K  4.3 04/26/2018   CO2 21 (L) 04/26/2018   GLUCOSE 313 (H) 04/26/2018   BUN 11 04/26/2018   CREATININE 0.95 04/26/2018   BILITOT 0.7 08/18/2017   ALKPHOS 75 08/18/2017   AST 24 08/18/2017   ALT 24 08/18/2017   PROT 7.8 08/18/2017   ALBUMIN 3.7 08/18/2017   CALCIUM 9.6 04/26/2018  ANIONGAP 14 04/26/2018   No results found for: CHOL No results found for: HDL No results found for: LDLCALC No results found for: TRIG No results found for: CHOLHDL Lab Results  Component Value Date   HGBA1C 13.3 (A) 10/24/2018      Assessment & Plan:   Problem List Items Addressed This Visit    Tobacco use disorder   Essential hypertension   Relevant Medications   amLODipine (NORVASC) 10 MG tablet   cloNIDine (CATAPRES) tablet 0.1 mg (Completed)   atorvastatin (LIPITOR) 20 MG tablet   losartan (COZAAR) 50 MG tablet   Other Relevant Orders   Comprehensive metabolic panel   Type 2 diabetes mellitus with complication, without long-term current use of insulin (HCC) - Primary   Relevant Medications   glipiZIDE (GLUCOTROL) 10 MG tablet   metFORMIN (GLUCOPHAGE) 1000 MG tablet   atorvastatin (LIPITOR) 20 MG tablet   losartan (COZAAR) 50 MG tablet   Insulin Glargine (LANTUS SOLOSTAR) 100 UNIT/ML Solostar Pen   Other Relevant Orders   HgB A1c (Completed)   Glucose (CBG) (Completed)   CBC with Differential   Lipid Panel   Microalbumin, urine   HM Diabetes Foot Exam (Completed)   Comprehensive metabolic panel    Other Visit Diagnoses    Encounter to establish care with new doctor        Parke was seen today for new patient (initial visit).  Diagnoses and all orders for this visit:  Type 2 diabetes mellitus with complication, without long-term current use of insulin (HCC) -     HgB A1c -     Glucose (CBG) -     CBC with Differential -     Lipid Panel -     Microalbumin, urine -     HM Diabetes Foot Exam -     Comprehensive metabolic panel  Essential hypertension -     cloNIDine  (CATAPRES) tablet 0.1 mg -     Cancel: Comprehensive metabolic panel; Future -     Comprehensive metabolic panel  Tobacco use disorder Each visit d/w cessation  Encounter to establish care with new doctor Transfer care care due to finances   Other orders -     amLODipine (NORVASC) 10 MG tablet; Take 1 tablet (10 mg total) by mouth daily for 30 days. -     glipiZIDE (GLUCOTROL) 10 MG tablet; Take 1 tablet (10 mg total) by mouth 2 (two) times daily before a meal for 30 days. -     metFORMIN (GLUCOPHAGE) 1000 MG tablet; Take 1 tablet (1,000 mg total) by mouth 2 (two) times daily with a meal. -     atorvastatin (LIPITOR) 20 MG tablet; Take 1 tablet (20 mg total) by mouth daily. (Patient not taking: Reported on 01/25/2019) -     losartan (COZAAR) 50 MG tablet; Take 1 tablet (50 mg total) by mouth daily. -     Insulin Glargine (LANTUS SOLOSTAR) 100 UNIT/ML Solostar Pen; Inject 20 Units into the skin daily.    Meds ordered this encounter  Medications  . amLODipine (NORVASC) 10 MG tablet    Sig: Take 1 tablet (10 mg total) by mouth daily for 30 days.    Dispense:  30 tablet    Refill:  3  . cloNIDine (CATAPRES) tablet 0.1 mg  . glipiZIDE (GLUCOTROL) 10 MG tablet    Sig: Take 1 tablet (10 mg total) by mouth 2 (two) times daily before a meal for 30 days.    Dispense:  60 tablet    Refill:  3  . metFORMIN (GLUCOPHAGE) 1000 MG tablet    Sig: Take 1 tablet (1,000 mg total) by mouth 2 (two) times daily with a meal.    Dispense:  180 tablet    Refill:  3  . atorvastatin (LIPITOR) 20 MG tablet    Sig: Take 1 tablet (20 mg total) by mouth daily.    Dispense:  90 tablet    Refill:  3  . losartan (COZAAR) 50 MG tablet    Sig: Take 1 tablet (50 mg total) by mouth daily.    Dispense:  90 tablet    Refill:  3  . Insulin Glargine (LANTUS SOLOSTAR) 100 UNIT/ML Solostar Pen    Sig: Inject 20 Units into the skin daily.    Dispense:  5 pen    Refill:  PRN  1. Type 2 diabetes mellitus with  complication, without long-term current use of insulin (HCC)  - HgB A1c  13 - Glucose (CBG) - CBC with Differential - Lipid Panel - Microalbumin, urine - HM Diabetes Foot Exam  2. Essential hypertension - cloNIDine (CATAPRES) tablet 0.1 mg - Comprehensive metabolic panel;  3. Tobacco use disorder Each visit d/w cessation   4. Encounter to establish care with new doctor F/u with clinical pharmacist for diabetic teaching and HTN  Follow-up: Return in about 3 months (around 01/24/2019) for HTN/DM.    Kerin Perna, NP

## 2018-10-25 ENCOUNTER — Telehealth (INDEPENDENT_AMBULATORY_CARE_PROVIDER_SITE_OTHER): Payer: Self-pay

## 2018-10-25 LAB — CBC WITH DIFFERENTIAL/PLATELET
Basophils Absolute: 0 10*3/uL (ref 0.0–0.2)
Basos: 0 %
EOS (ABSOLUTE): 0.1 10*3/uL (ref 0.0–0.4)
Eos: 2 %
Hematocrit: 44.2 % (ref 37.5–51.0)
Hemoglobin: 15 g/dL (ref 13.0–17.7)
Immature Grans (Abs): 0 10*3/uL (ref 0.0–0.1)
Immature Granulocytes: 0 %
Lymphocytes Absolute: 2.4 10*3/uL (ref 0.7–3.1)
Lymphs: 35 %
MCH: 29.6 pg (ref 26.6–33.0)
MCHC: 33.9 g/dL (ref 31.5–35.7)
MCV: 87 fL (ref 79–97)
Monocytes Absolute: 0.9 10*3/uL (ref 0.1–0.9)
Monocytes: 14 %
Neutrophils Absolute: 3.3 10*3/uL (ref 1.4–7.0)
Neutrophils: 49 %
PLATELETS: 309 10*3/uL (ref 150–450)
RBC: 5.06 x10E6/uL (ref 4.14–5.80)
RDW: 14.1 % (ref 11.6–15.4)
WBC: 6.9 10*3/uL (ref 3.4–10.8)

## 2018-10-25 LAB — COMPREHENSIVE METABOLIC PANEL
ALBUMIN: 4.2 g/dL (ref 4.0–5.0)
ALT: 13 IU/L (ref 0–44)
AST: 13 IU/L (ref 0–40)
Albumin/Globulin Ratio: 1.4 (ref 1.2–2.2)
Alkaline Phosphatase: 72 IU/L (ref 39–117)
BUN / CREAT RATIO: 15 (ref 9–20)
BUN: 16 mg/dL (ref 6–20)
Bilirubin Total: 0.5 mg/dL (ref 0.0–1.2)
CHLORIDE: 96 mmol/L (ref 96–106)
CO2: 25 mmol/L (ref 20–29)
Calcium: 9.4 mg/dL (ref 8.7–10.2)
Creatinine, Ser: 1.04 mg/dL (ref 0.76–1.27)
GFR calc Af Amer: 104 mL/min/{1.73_m2} (ref >59)
GFR calc non Af Amer: 90 mL/min/{1.73_m2} (ref >59)
Globulin, Total: 3.1 g/dL (ref 1.5–4.5)
Glucose: 284 mg/dL — ABNORMAL HIGH (ref 65–99)
Potassium: 4.4 mmol/L (ref 3.5–5.2)
Sodium: 134 mmol/L (ref 134–144)
Total Protein: 7.3 g/dL (ref 6.0–8.5)

## 2018-10-25 LAB — LIPID PANEL
CHOLESTEROL TOTAL: 161 mg/dL (ref 100–199)
Chol/HDL Ratio: 3.2 ratio (ref 0.0–5.0)
HDL: 51 mg/dL (ref >39)
LDL Calculated: 91 mg/dL (ref 0–99)
Triglycerides: 96 mg/dL (ref 0–149)
VLDL Cholesterol Cal: 19 mg/dL (ref 5–40)

## 2018-10-25 LAB — MICROALBUMIN, URINE: Microalbumin, Urine: 76.3 ug/mL

## 2018-10-25 NOTE — Telephone Encounter (Signed)
Patient is aware that all labs are normal except elevated glucose. Continue to work on controlling diabetes. Brandon Mcneil, CMA

## 2018-10-25 NOTE — Telephone Encounter (Signed)
-----   Message from Grayce Sessions, NP sent at 10/25/2018 12:00 PM EDT ----- Elevated A1C 13 - glucose will show in urine

## 2018-11-07 ENCOUNTER — Ambulatory Visit (INDEPENDENT_AMBULATORY_CARE_PROVIDER_SITE_OTHER): Payer: Self-pay

## 2019-01-24 ENCOUNTER — Ambulatory Visit (INDEPENDENT_AMBULATORY_CARE_PROVIDER_SITE_OTHER): Payer: Self-pay | Admitting: Primary Care

## 2019-01-25 ENCOUNTER — Ambulatory Visit (INDEPENDENT_AMBULATORY_CARE_PROVIDER_SITE_OTHER): Payer: Self-pay | Admitting: Primary Care

## 2019-01-25 ENCOUNTER — Encounter (INDEPENDENT_AMBULATORY_CARE_PROVIDER_SITE_OTHER): Payer: Self-pay | Admitting: Primary Care

## 2019-01-25 ENCOUNTER — Other Ambulatory Visit: Payer: Self-pay

## 2019-01-25 VITALS — BP 147/103 | HR 77 | Temp 98.2°F | Ht 68.0 in | Wt 214.2 lb

## 2019-01-25 DIAGNOSIS — Z794 Long term (current) use of insulin: Secondary | ICD-10-CM

## 2019-01-25 DIAGNOSIS — I10 Essential (primary) hypertension: Secondary | ICD-10-CM

## 2019-01-25 DIAGNOSIS — E118 Type 2 diabetes mellitus with unspecified complications: Secondary | ICD-10-CM

## 2019-01-25 DIAGNOSIS — F17200 Nicotine dependence, unspecified, uncomplicated: Secondary | ICD-10-CM

## 2019-01-25 DIAGNOSIS — E1140 Type 2 diabetes mellitus with diabetic neuropathy, unspecified: Secondary | ICD-10-CM

## 2019-01-25 DIAGNOSIS — R200 Anesthesia of skin: Secondary | ICD-10-CM

## 2019-01-25 LAB — GLUCOSE, POCT (MANUAL RESULT ENTRY): POC Glucose: 287 mg/dl — AB (ref 70–99)

## 2019-01-25 MED ORDER — LOSARTAN POTASSIUM-HCTZ 100-25 MG PO TABS: 1.0000 | ORAL_TABLET | Freq: Every day | ORAL | 3 refills | Status: DC

## 2019-01-25 MED ORDER — ATORVASTATIN CALCIUM 20 MG PO TABS: 20.0000 mg | ORAL_TABLET | Freq: Every day | ORAL | 3 refills | Status: DC

## 2019-01-25 MED ORDER — NICOTINE 14 MG/24HR TD PT24: 14.0000 mg | MEDICATED_PATCH | Freq: Every day | TRANSDERMAL | 0 refills | Status: DC

## 2019-01-25 MED ORDER — NICOTINE 21 MG/24HR TD PT24: 21.0000 mg | MEDICATED_PATCH | Freq: Every day | TRANSDERMAL | 0 refills | Status: DC

## 2019-01-25 MED ORDER — NICOTINE 7 MG/24HR TD PT24: 7.0000 mg | MEDICATED_PATCH | Freq: Every day | TRANSDERMAL | 0 refills | Status: DC

## 2019-01-25 MED FILL — LOSARTAN-HCTZ 100-25 MG TAB: 100-25 | 30 days supply | Qty: 30 | Fill #0

## 2019-01-25 MED FILL — NICOTINE 21 MG/24HR PATCH: 21 | 28 days supply | Qty: 28 | Fill #0

## 2019-01-25 MED FILL — ?ATORVASTATIN 20 MG TABLET: 20 | 30 days supply | Qty: 30 | Fill #0

## 2019-01-25 NOTE — Patient Instructions (Signed)
Tobacco Use Disorder Tobacco use disorder (TUD) occurs when a person craves, seeks, and uses tobacco, regardless of the consequences. This disorder can cause problems with mental and physical health. It can affect your ability to have healthy relationships, and it can keep you from meeting your responsibilities at work, home, or school. Tobacco may be:  Smoked as a cigarette or cigar.  Inhaled using e-cigarettes.  Smoked in a pipe or hookah.  Chewed as smokeless tobacco.  Inhaled into the nostrils as snuff. Tobacco products contain a dangerous chemical called nicotine, which is very addictive. Nicotine triggers hormones that make the body feel stimulated and works on areas of the brain that make you feel good. These effects can make it hard for people to quit nicotine. Tobacco contains many other unsafe chemicals that can damage almost every organ in the body. Smoking tobacco also puts others in danger due to fire risk and possible health problems caused by breathing in secondhand smoke. What are the signs or symptoms? Symptoms of TUD may include:  Being unable to slow down or stop your tobacco use.  Spending an abnormal amount of time getting or using tobacco.  Craving tobacco. Cravings may last for up to 6 months after quitting.  Tobacco use that: ? Interferes with your work, school, or home life. ? Interferes with your personal and social relationships. ? Makes you give up activities that you once enjoyed or found important.  Using tobacco even though you know that it is: ? Dangerous or bad for your health or someone else's health. ? Causing problems in your life.  Needing more and more of the substance to get the same effect (developing tolerance).  Experiencing unpleasant symptoms if you do not use the substance (withdrawal). Withdrawal symptoms may include: ? Depressed, anxious, or irritable mood. ? Difficulty concentrating. ? Increased appetite. ? Restlessness or trouble  sleeping.  Using the substance to avoid withdrawal. How is this diagnosed? This condition may be diagnosed based on:  Your current and past tobacco use. Your health care provider may ask questions about how your tobacco use affects your life.  A physical exam. You may be diagnosed with TUD if you have at least two symptoms within a 12-month period. How is this treated? This condition is treated by stopping tobacco use. Many people are unable to quit on their own and need help. Treatment may include:  Nicotine replacement therapy (NRT). NRT provides nicotine without the other harmful chemicals in tobacco. NRT gradually lowers the dosage of nicotine in the body and reduces withdrawal symptoms. NRT is available as: ? Over-the-counter gums, lozenges, and skin patches. ? Prescription mouth inhalers and nasal sprays.  Medicine that acts on the brain to reduce cravings and withdrawal symptoms.  A type of talk therapy that examines your triggers for tobacco use, how to avoid them, and how to cope with cravings (behavioral therapy).  Hypnosis. This may help with withdrawal symptoms.  Joining a support group for others coping with TUD. The best treatment for TUD is usually a combination of medicine, talk therapy, and support groups. Recovery can be a long process. Many people start using tobacco again after stopping (relapse). If you relapse, it does not mean that treatment will not work. Follow these instructions at home:  Lifestyle  Do not use any products that contain nicotine or tobacco, such as cigarettes and e-cigarettes.  Avoid things that trigger tobacco use as much as you can. Triggers include people and situations that usually cause you   to use tobacco.  Avoid drinks that contain caffeine, including coffee. These may worsen some withdrawal symptoms.  Find ways to manage stress. Wanting to smoke may cause stress, and stress can make you want to smoke. Relaxation techniques such as  deep breathing, meditation, and yoga may help.  Attend support groups as needed. These groups are an important part of long-term recovery for many people. General instructions  Take over-the-counter and prescription medicines only as told by your health care provider.  Check with your health care provider before taking any new prescription or over-the-counter medicines.  Decide on a friend, family member, or smoking quit-line (such as 1-800-QUIT-NOW in the [place] that you can call or text when you feel the urge to smoke or when you need help coping with cravings.  Keep all follow-up visits as told by your health care provider and therapist. This is important. Contact a health care provider if:  You are not able to take your medicines as prescribed.  Your symptoms get worse, even with treatment. Summary  Tobacco use disorder (TUD) occurs when a person craves, seeks, and uses tobacco regardless of the consequences.  This condition may be diagnosed based on your current and past tobacco use and a physical exam.  Many people are unable to quit on their own and need help. Recovery can be a long process.  The most effective treatment for TUD is usually a combination of medicine, talk therapy, and support groups. This information is not intended to replace advice given to you by your health care provider. Make sure you discuss any questions you have with your health care provider. Document Released: 04/08/2004 Document Revised: 07/21/2017 Document Reviewed: 07/21/2017 Elsevier Interactive Patient Education  2019 [employer] Inc[employer]  

## 2019-01-25 NOTE — Progress Notes (Signed)
Numbness in left hand for 3 months

## 2019-01-25 NOTE — Progress Notes (Addendum)
Subjective:  Patient ID: Brandon Mcneil, male    DOB: 03-08-1981  Age: 38 y.o. MRN: [id number]  CC: Follow-up (HTN/DM )   HPI Brandon Mcneil presents for numbness in right hand for 3 months especially under fingernails.  PMH: Hypertension  and diabetes. He denies shortness of breath, headaches, chest pain or lower extremity edema.  Outpatient Medications Prior to Visit  Medication Sig Dispense Refill  . aspirin 81 MG tablet Take 1 tablet (81 mg total) by mouth daily. 90 tablet 3  . Blood Glucose Monitoring Suppl (AGAMATRIX PRESTO) w/Device KIT Test glucose twice daiy 1 kit 0  . glucose blood (AGAMATRIX PRESTO TEST) test strip Twice daily blood glucose testing 200 each 3  . ibuprofen (ADVIL,MOTRIN) 200 MG tablet Take 200-400 mg by mouth every 6 (six) hours as needed for moderate pain.    . Insulin Glargine (LANTUS SOLOSTAR) 100 UNIT/ML Solostar Pen Inject 20 Units into the skin daily. 5 pen PRN  . metFORMIN (GLUCOPHAGE) 1000 MG tablet Take 1 tablet (1,000 mg total) by mouth 2 (two) times daily with a meal. 180 tablet 3  . losartan (COZAAR) 50 MG tablet Take 1 tablet (50 mg total) by mouth daily. 90 tablet 3  . amLODipine (NORVASC) 10 MG tablet Take 1 tablet (10 mg total) by mouth daily for 30 days. 30 tablet 3  . glipiZIDE (GLUCOTROL) 10 MG tablet Take 1 tablet (10 mg total) by mouth 2 (two) times daily before a meal for 30 days. 60 tablet 3  . glucose blood test strip Use as instructed 100 each 0  . atorvastatin (LIPITOR) 20 MG tablet Take 1 tablet (20 mg total) by mouth daily. (Patient not taking: Reported on 01/25/2019) 90 tablet 3   No facility-administered medications prior to visit.     ROS Review of Systems  Musculoskeletal:       Shoulder pain  Neurological: Positive for weakness and numbness.    Objective:  BP (!) 147/103 (BP Location: Left Arm, Patient Position: Sitting, Cuff Size: Normal)   Pulse 77   Temp 98.2 F (36.8 C) (Oral)   Ht '5\' 8"'  (1.727 m)   Wt 214  lb 3.2 oz (97.2 kg)   SpO2 98%   BMI 32.57 kg/m   BP/Weight 01/25/2019 10/24/2018 11/05/252  Systolic BP 270 623 762  Diastolic BP 831 85 517  Wt. (Lbs) 214.2 212 -  BMI 32.57 32.23 -   Physical exam Constitutional constitutional: Fatigue Pulmonary: lungs clear anterior and posterior Cardiovascular: Normal sinus rhythm Abdominal: Distended soft with normal bowel sounds Musculoskeletal: Lower extremity no edema Skin: Warm and dry intact Neurological: Alert and oriented to person, place and Time   Assessment & Plan:   1. Type 2 diabetes mellitus with complication, without long-term current use of insulin (HCC) Noncompliance  - Glucose (CBG) Kenton was seen today for follow-up.  Diagnoses and all orders for this visit:  Type 2 diabetes mellitus with diabetic neuropathy, with long-term current use of insulin ([place]) ADA recommends the following therapeutic goals for glycemic control related to A1c measurements: Goal of therapy: Less than 6.5 hemoglobin A1c.  Reference clinical practice recommendations. Foods that are high in carbohydrates are the following rice, potatoes, breads, sugars, and pastas.  Reduction in the intake (eating) will assist in lowering your blood sugars. Noncompliant discussed increased risk of heart attack or stroke  -     Glucose (CBG) -     Hemoglobin A1c  Essential hypertension, benign Counseled on blood pressure  goal of less than 130/80, low-sodium, DASH diet, medication compliance, 150 minutes of moderate intensity exercise per week. Discussed medication compliance, adverse effects. -     losartan-hydrochlorothiazide (HYZAAR) 100-25 MG tablet; Take 1 tablet by mouth daily.  Tobacco dependence Nicotine can decrease circulation in your body and affect every organ. Reseach shows increase in lung cancer and respiratory problems. When you are ready to stop let's talk. Agreed to stop sent in patches Other orders -     nicotine (NICODERM CQ) 21 mg/24hr patch;  Place 1 patch (21 mg total) onto the skin daily. -     nicotine (NICODERM CQ) 14 mg/24hr patch; Place 1 patch (14 mg total) onto the skin daily. -     nicotine (NICODERM CQ) 7 mg/24hr patch; Place 1 patch (7 mg total) onto the skin daily. -     atorvastatin (LIPITOR) 20 MG tablet; Take 1 tablet (20 mg total) by mouth daily.    Meds ordered this encounter  Medications  . losartan-hydrochlorothiazide (HYZAAR) 100-25 MG tablet    Sig: Take 1 tablet by mouth daily.    Dispense:  90 tablet    Refill:  3  . nicotine (NICODERM CQ) 21 mg/24hr patch    Sig: Place 1 patch (21 mg total) onto the skin daily.    Dispense:  28 patch    Refill:  0  . nicotine (NICODERM CQ) 14 mg/24hr patch    Sig: Place 1 patch (14 mg total) onto the skin daily.    Dispense:  28 patch    Refill:  0  . nicotine (NICODERM CQ) 7 mg/24hr patch    Sig: Place 1 patch (7 mg total) onto the skin daily.    Dispense:  28 patch    Refill:  0  . atorvastatin (LIPITOR) 20 MG tablet    Sig: Take 1 tablet (20 mg total) by mouth daily.    Dispense:  90 tablet    Refill:  3    Follow-up: Return in about 4 weeks (around 02/22/2019) for T2D ,HTN.   Kerin Perna NP

## 2019-01-26 LAB — HEMOGLOBIN A1C
Est. average glucose Bld gHb Est-mCnc: 258 mg/dL
Hgb A1c MFr Bld: 10.6 % — ABNORMAL HIGH (ref 4.8–5.6)

## 2019-02-03 ENCOUNTER — Encounter (HOSPITAL_COMMUNITY): Payer: Self-pay | Admitting: Emergency Medicine

## 2019-02-03 ENCOUNTER — Other Ambulatory Visit: Payer: Self-pay

## 2019-02-03 ENCOUNTER — Ambulatory Visit (HOSPITAL_COMMUNITY)
Admission: EM | Admit: 2019-02-03 | Discharge: 2019-02-03 | Disposition: A | Discharge status: Home / Self Care | Payer: Self-pay | Attending: Internal Medicine | Admitting: Internal Medicine

## 2019-02-03 DIAGNOSIS — R739 Hyperglycemia, unspecified: Secondary | ICD-10-CM

## 2019-02-03 DIAGNOSIS — F17210 Nicotine dependence, cigarettes, uncomplicated: Secondary | ICD-10-CM

## 2019-02-03 DIAGNOSIS — E1165 Type 2 diabetes mellitus with hyperglycemia: Secondary | ICD-10-CM

## 2019-02-03 DIAGNOSIS — F10988 Alcohol use, unspecified with other alcohol-induced disorder: Secondary | ICD-10-CM

## 2019-02-03 DIAGNOSIS — I10 Essential (primary) hypertension: Secondary | ICD-10-CM

## 2019-02-03 DIAGNOSIS — R251 Tremor, unspecified: Secondary | ICD-10-CM

## 2019-02-03 LAB — GLUCOSE, CAPILLARY: Glucose-Capillary: 332 mg/dL — ABNORMAL HIGH (ref 70–99)

## 2019-02-03 LAB — POCT URINALYSIS DIP (DEVICE)
Bilirubin Urine: NEGATIVE
Glucose, UA: >=1000 mg/dL — AB
Hgb urine dipstick: NEGATIVE
Ketones, ur: NEGATIVE mg/dL
Leukocytes,Ua: NEGATIVE
Nitrite: NEGATIVE
Protein, ur: NEGATIVE mg/dL
Specific Gravity, Urine: <=1.005 (ref 1.005–1.030)
Urobilinogen, UA: 0.2 mg/dL (ref 0.0–1.0)
pH: 5.5 (ref 5.0–8.0)

## 2019-02-03 NOTE — Discharge Instructions (Addendum)
You must go home and drink only water: No more alcohol intake today. Keep a sugar log of all your readings to bring to your next PCP appointment. Call your PCP on Monday to schedule this appointment for next week.  The ER if you develop lightheadedness, dizziness, passout, chest pain, palpitations, severe abdominal pain.

## 2019-02-03 NOTE — ED Triage Notes (Signed)
Pt reports feeling very shaky.  He states his BS was 399 this morning.  He took 20 Units of insulin.  He checked his BS again and it was 300.  He states he then took another 20 Units.

## 2019-02-03 NOTE — ED Provider Notes (Signed)
[facility]    CSN: [id number] Arrival date & time: 02/03/19  1841     History   Chief Complaint Chief Complaint  Patient presents with  . shaky    HPI Brandon Mcneil is a 38 y.o. male with history of uncontrolled diabetes, hypertension, bipolar 1 disorder, tobacco use, alcohol use presenting for shakiness.  Patient appears to be slightly intoxicated during appointment and history very slightly throughout the visit.  Patient states that his PCP discontinued his metformin and glipizide on 6/10, resumed Lantus.  Patient states that his difficulty checking her sugars throughout the day since he does not like poking his fingers.  Patient states he was not feeling well this morning, so he checked his blood sugar around noon and had a reading of 499.  States that he took 20 units of his Lantus, checked again in 15 minutes later and had a reading of 4-15, so he took another 15 units.  Patient states that he does not understand how to use sliding scale insulin, requesting "a worksheet or something that can help ".  Patient states that he does not feel shaky anymore, though has been drinking all day.  Has consumed six 12 ounce beers.  Patient states that his sugars tend to run in the 200s: Last A1c done 6/10: 10.6%, down from greater than 13% 6 months prior.  In office, patient denies headache, change in vision, polyphagia, polyuria, polydipsia, chest pain, palpitations, cough, shortness of breath, abdominal pain, nausea, vomiting, diarrhea, hematochezia, melena, hematuria, urinary frequency or urgency, burning with urination.  She states that several points during appointment that he needs to go and that his friend is waiting for him in the parking lot..   Past Medical History:  Diagnosis Date  . Depression   . Diabetes ([place])   . Essential hypertension 02/03/2017  . Hypercholesteremia   . Hypertension     Patient Active Problem List   Diagnosis Date Noted  . Stress 02/08/2017  .  Essential hypertension 02/03/2017  . Type 2 diabetes mellitus with complication, without long-term current use of insulin ([place]) 02/03/2017  . Medication overdose 10/31/2013  . Bipolar I disorder, most recent episode depressed ([place]) 09/11/2013  . Tobacco use disorder 09/11/2013  . Newly diagnosed diabetes ([place]) 09/11/2013  . Dehydration 09/11/2013  . Pain, dental 09/11/2013  . DKA (diabetic ketoacidoses) ([place]) 09/10/2013    History reviewed. No pertinent surgical history.     Home Medications    Prior to Admission medications   Medication Sig Start Date End Date Taking? Authorizing Provider  aspirin 81 MG tablet Take 1 tablet (81 mg total) by mouth daily. 02/03/17  Yes Mack Hook, MD  Blood Glucose Monitoring Suppl [facility] PRESTO) w/Device KIT Test glucose twice daiy 06/15/16  Yes Mack Hook, MD  glucose blood (AGAMATRIX PRESTO TEST) test strip Twice daily blood glucose testing 02/03/17  Yes Mack Hook, MD  glucose blood test strip Use as instructed 06/09/17  Yes Maczis, Barth Kirks, PA-C  Insulin Glargine (LANTUS SOLOSTAR) 100 UNIT/ML Solostar Pen Inject 20 Units into the skin daily. 10/24/18  Yes Kerin Perna, NP  amLODipine (NORVASC) 10 MG tablet Take 1 tablet (10 mg total) by mouth daily for 30 days. 10/24/18 11/23/18  Kerin Perna, NP  atorvastatin (LIPITOR) 20 MG tablet Take 1 tablet (20 mg total) by mouth daily. 01/25/19   Kerin Perna, NP  glipiZIDE (GLUCOTROL) 10 MG tablet Take 1 tablet (10 mg total) by mouth 2 (two) times  daily before a meal for 30 days. 10/24/18 11/23/18  Kerin Perna, NP  ibuprofen (ADVIL,MOTRIN) 200 MG tablet Take 200-400 mg by mouth every 6 (six) hours as needed for moderate pain.    [provider]  losartan-hydrochlorothiazide (HYZAAR) 100-25 MG tablet Take 1 tablet by mouth daily. 01/25/19   Kerin Perna, NP  metFORMIN (GLUCOPHAGE) 1000 MG tablet Take 1 tablet (1,000 mg total) by mouth 2 (two)  times daily with a meal. 10/24/18   Kerin Perna, NP  nicotine (NICODERM CQ) 14 mg/24hr patch Place 1 patch (14 mg total) onto the skin daily. 01/25/19   Kerin Perna, NP  nicotine (NICODERM CQ) 21 mg/24hr patch Place 1 patch (21 mg total) onto the skin daily. 01/25/19   Kerin Perna, NP  nicotine (NICODERM CQ) 7 mg/24hr patch Place 1 patch (7 mg total) onto the skin daily. 01/25/19   Kerin Perna, NP    Family History History reviewed. No pertinent family history.  Social History Social History   Tobacco Use  . Smoking status: Current Every Day Smoker    Packs/day: 0.50    Years: 14.00    Pack years: 7.00    Types: Cigarettes  . Smokeless tobacco: Never Used  . Tobacco comment: 1/2 pack a day  Substance Use Topics  . Alcohol use: Yes    Alcohol/week: 2.0 standard drinks    Types: 2 Cans of beer per week    Comment: occasional  . Drug use: No    Types: Marijuana    Comment: MJ former user.  Last use in September 2017     Allergies   Lisinopril and Penicillins   Review of Systems As per HPI   Physical Exam Triage Vital Signs ED Triage Vitals  Enc Vitals Group     BP 02/03/19 1920 133/82     Pulse Rate 02/03/19 1920 (!) 111     Resp 02/03/19 1920 18     Temp 02/03/19 1920 100.1 F (37.8 C)     Temp Source 02/03/19 1920 Oral     SpO2 02/03/19 1920 95 %     Weight --      Height --      Head Circumference --      Peak Flow --      Pain Score 02/03/19 1924 0     Pain Loc --      Pain Edu? --      Excl. in [place]? --    No data found.  Updated Vital Signs BP 133/82 (BP Location: Left Arm)   Pulse (!) 111   Temp 100.1 F (37.8 C) (Oral)   Resp 18   SpO2 95%   Visual Acuity Right Eye Distance:   Left Eye Distance:   Bilateral Distance:    Right Eye Near:   Left Eye Near:    Bilateral Near:     Physical Exam Constitutional:      General: He is not in acute distress.    Appearance: He is obese. He is not toxic-appearing.   HENT:     Head: Normocephalic and atraumatic.     Mouth/Throat:     Mouth: Mucous membranes are dry.     Pharynx: No oropharyngeal exudate or posterior oropharyngeal erythema.  Eyes:     General: No scleral icterus.    Extraocular Movements: Extraocular movements intact.     Conjunctiva/sclera: Conjunctivae normal.     Pupils: Pupils are equal, round, and reactive to  light.  Neck:     Musculoskeletal: Normal range of motion and neck supple. No muscular tenderness.  Cardiovascular:     Rate and Rhythm: Regular rhythm. Tachycardia present.  Pulmonary:     Effort: Pulmonary effort is normal. No respiratory distress.     Breath sounds: No wheezing, rhonchi or rales.  Abdominal:     General: Bowel sounds are normal. There is no distension.     Palpations: Abdomen is soft.     Tenderness: There is no abdominal tenderness. There is no right CVA tenderness, left CVA tenderness or guarding.  Musculoskeletal:     Comments: Full active range of motion with 5/5 strength in upper and lower extremities bilaterally symmetric.  Lymphadenopathy:     Cervical: No cervical adenopathy.  Skin:    General: Skin is warm.     Capillary Refill: Capillary refill takes less than 2 seconds.     Coloration: Skin is not jaundiced or pale.     Findings: No bruising.  Neurological:     General: No focal deficit present.     Mental Status: He is alert and oriented to person, place, and time.     Cranial Nerves: No cranial nerve deficit.     Motor: No weakness.     Gait: Gait normal.     Deep Tendon Reflexes: Reflexes normal.  Psychiatric:     Comments: Patient has pleasant affect, though does appear to be mildly intoxicated.      UC Treatments / Results  Labs (all labs ordered are listed, but only abnormal results are displayed) Labs Reviewed  GLUCOSE, CAPILLARY - Abnormal; Notable for the following components:      Result Value   Glucose-Capillary 332 (*)    All other components within normal  limits  POCT URINALYSIS DIP (DEVICE) - Abnormal; Notable for the following components:   Glucose, UA >=1000 (*)    All other components within normal limits  CBG MONITORING, ED    EKG None  Radiology No results found.  Procedures Procedures (including critical care time)  Medications Ordered in UC Medications - No data to display  Initial Impression / Assessment and Plan / UC Course  I have reviewed the triage vital signs and the nursing notes.  Pertinent labs & imaging results that were available during my care of the patient were reviewed by me and considered in my medical decision making (see chart for details).     38 year old male with history of type 2 diabetes, hypertension, bipolar disorder type I, nicotine and alcohol use presenting for hyperglycemia.  Patient has had low water intake today in conjunction with high alcohol intake.  Tachycardia likely due to dehydration.  Discussed Lantus being long-term insulin, not short-term.  Presented option of going to hospital for IV fluids and insulin, patient declined.  Discussed importance of alcohol cessation today and increasing water intake when he gets home.  Patient verbalized understanding.  Risks of dehydration in conjunction with T2DM w/ hyperglycemia discussed including HHS, syncope, coma.  Patient verbalized understanding, electing to go home to orally rehydrate.  Will follow up with PCP regarding glucose management.  Encourage patient to keep a sugar log until that appointment. Final Clinical Impressions(s) / UC Diagnoses   Final diagnoses:  Hyperglycemia     Discharge Instructions     You must go home and drink only water: No more alcohol intake today. Keep a sugar log of all your readings to bring to your next PCP appointment. Call your  PCP on Monday to schedule this appointment for next week.  The ER if you develop lightheadedness, dizziness, passout, chest pain, palpitations, severe abdominal pain.    ED  Prescriptions    None     Controlled Substance Prescriptions [place] Controlled Substance Registry consulted? Not Applicable   Quincy Sheehan, [place] 02/05/19 1025

## 2019-02-05 ENCOUNTER — Other Ambulatory Visit: Payer: Self-pay

## 2019-02-05 ENCOUNTER — Emergency Department (HOSPITAL_COMMUNITY)
Admission: EM | Admit: 2019-02-05 | Discharge: 2019-02-05 | Disposition: A | Discharge status: Home / Self Care | Payer: Self-pay | Attending: Emergency Medicine | Admitting: Emergency Medicine

## 2019-02-05 ENCOUNTER — Emergency Department (HOSPITAL_COMMUNITY): Payer: Self-pay

## 2019-02-05 DIAGNOSIS — E1165 Type 2 diabetes mellitus with hyperglycemia: Secondary | ICD-10-CM | POA: Insufficient documentation

## 2019-02-05 DIAGNOSIS — R739 Hyperglycemia, unspecified: Secondary | ICD-10-CM

## 2019-02-05 DIAGNOSIS — I10 Essential (primary) hypertension: Secondary | ICD-10-CM | POA: Insufficient documentation

## 2019-02-05 DIAGNOSIS — F17210 Nicotine dependence, cigarettes, uncomplicated: Secondary | ICD-10-CM | POA: Insufficient documentation

## 2019-02-05 DIAGNOSIS — Z7982 Long term (current) use of aspirin: Secondary | ICD-10-CM | POA: Insufficient documentation

## 2019-02-05 DIAGNOSIS — Z20828 Contact with and (suspected) exposure to other viral communicable diseases: Secondary | ICD-10-CM | POA: Insufficient documentation

## 2019-02-05 DIAGNOSIS — Z79899 Other long term (current) drug therapy: Secondary | ICD-10-CM | POA: Insufficient documentation

## 2019-02-05 LAB — HEPATIC FUNCTION PANEL
ALT: 17 U/L (ref 0–44)
AST: 20 U/L (ref 15–41)
Albumin: 3.5 g/dL (ref 3.5–5.0)
Alkaline Phosphatase: 89 U/L (ref 38–126)
Bilirubin, Direct: 0.2 mg/dL (ref 0.0–0.2)
Indirect Bilirubin: 0.7 mg/dL (ref 0.3–0.9)
Total Bilirubin: 0.9 mg/dL (ref 0.3–1.2)
Total Protein: 6.5 g/dL (ref 6.5–8.1)

## 2019-02-05 LAB — CBG MONITORING, ED
Glucose-Capillary: 375 mg/dL — ABNORMAL HIGH (ref 70–99)
Glucose-Capillary: 252 mg/dL — ABNORMAL HIGH (ref 70–99)
Glucose-Capillary: 248 mg/dL — ABNORMAL HIGH (ref 70–99)

## 2019-02-05 LAB — CBC WITH DIFFERENTIAL/PLATELET
Abs Immature Granulocytes: 0.02 10*3/uL (ref 0.00–0.07)
Basophils Absolute: 0 10*3/uL (ref 0.0–0.1)
Basophils Relative: 0 %
Eosinophils Absolute: 0.1 10*3/uL (ref 0.0–0.5)
Eosinophils Relative: 1 %
HCT: 40.2 % (ref 39.0–52.0)
Hemoglobin: 14.9 g/dL (ref 13.0–17.0)
Immature Granulocytes: 0 %
Lymphocytes Relative: 24 %
Lymphs Abs: 1.5 10*3/uL (ref 0.7–4.0)
MCH: 31.6 pg (ref 26.0–34.0)
MCHC: 37.1 g/dL — ABNORMAL HIGH (ref 30.0–36.0)
MCV: 85.2 fL (ref 80.0–100.0)
Monocytes Absolute: 0.8 10*3/uL (ref 0.1–1.0)
Monocytes Relative: 13 %
Neutro Abs: 3.7 10*3/uL (ref 1.7–7.7)
Neutrophils Relative %: 62 %
Platelets: 210 10*3/uL (ref 150–400)
RBC: 4.72 MIL/uL (ref 4.22–5.81)
RDW: 12.9 % (ref 11.5–15.5)
WBC: 6 10*3/uL (ref 4.0–10.5)
nRBC: 0 % (ref 0.0–0.2)

## 2019-02-05 LAB — URINALYSIS, ROUTINE W REFLEX MICROSCOPIC
Bacteria, UA: NONE SEEN
Bilirubin Urine: NEGATIVE
Glucose, UA: >=500 mg/dL — AB
Hgb urine dipstick: NEGATIVE
Ketones, ur: NEGATIVE mg/dL
Leukocytes,Ua: NEGATIVE
Nitrite: NEGATIVE
Protein, ur: NEGATIVE mg/dL
Specific Gravity, Urine: 1.009 (ref 1.005–1.030)
pH: 6 (ref 5.0–8.0)

## 2019-02-05 LAB — BASIC METABOLIC PANEL
Anion gap: 9 (ref 5–15)
BUN: 10 mg/dL (ref 6–20)
CO2: 23 mmol/L (ref 22–32)
Calcium: 8.8 mg/dL — ABNORMAL LOW (ref 8.9–10.3)
Chloride: 100 mmol/L (ref 98–111)
Creatinine, Ser: 0.97 mg/dL (ref 0.61–1.24)
GFR calc Af Amer: >60 mL/min (ref >60)
GFR calc non Af Amer: >60 mL/min (ref >60)
Glucose, Bld: 337 mg/dL — ABNORMAL HIGH (ref 70–99)
Potassium: 3.7 mmol/L (ref 3.5–5.1)
Sodium: 132 mmol/L — ABNORMAL LOW (ref 135–145)

## 2019-02-05 LAB — TROPONIN I: Troponin I: <0.03 ng/mL (ref <0.03)

## 2019-02-05 LAB — MAGNESIUM: Magnesium: 2 mg/dL (ref 1.7–2.4)

## 2019-02-05 MED ORDER — GLIPIZIDE 10 MG PO TABS: 10.0000 mg | ORAL_TABLET | Freq: Two times a day (BID) | ORAL | 0 refills | Status: DC

## 2019-02-05 MED ORDER — ASPIRIN EC 325 MG PO TBEC
325.0000 mg | DELAYED_RELEASE_TABLET | Freq: Once | ORAL | Status: AC
  Administered 2019-02-05: 18:00:00 325 mg via ORAL
  Filled 2019-02-05: qty 1

## 2019-02-05 MED ORDER — METFORMIN HCL 1000 MG PO TABS: 1000.0000 mg | ORAL_TABLET | Freq: Two times a day (BID) | ORAL | 0 refills | Status: DC

## 2019-02-05 MED ORDER — LACTATED RINGERS IV BOLUS
1000.0000 mL | Freq: Once | INTRAVENOUS | Status: AC
  Administered 2019-02-05: 1000 mL via INTRAVENOUS

## 2019-02-05 NOTE — ED Notes (Signed)
Gave pt urinal 

## 2019-02-05 NOTE — ED Notes (Signed)
Discharge instructions discussed with pt. Pt verbalized understanding. Pt stable and ambulatory. No signature pad available. All questions answered.

## 2019-02-05 NOTE — ED Notes (Signed)
Pt's CBG result was 248. Informed Elmyra Ricks - RN.

## 2019-02-05 NOTE — ED Notes (Signed)
Pt reporting he "feels warm all over." Repeat EKG ordered by MD.

## 2019-02-05 NOTE — ED Provider Notes (Signed)
[facility] EMERGENCY DEPARTMENT Provider Note   CSN: [id number] Arrival date & time: 02/05/19  1428    History   Chief Complaint Chief Complaint  Patient presents with  . Hyperglycemia    HPI Brandon Mcneil is a 38 y.o. male.     The history is provided by the patient, medical records and the EMS personnel.  Hyperglycemia Blood sugar level PTA:  400 Severity:  Moderate Onset quality:  Gradual Duration:  3 hours Timing:  Constant Progression:  Unchanged Chronicity:  Recurrent Diabetes status:  Controlled with insulin and controlled with oral medications Current diabetic therapy:  Patient is supposed to be on glipizide, metformin, Lantus, has been out of p.o. medications, out of Lantus, has been taking some extra NovoLog but a family member had Time since last antidiabetic medication:  3 hours (Patient injected 15 units NovoLog 3 hours ago) Context: change in medication and noncompliance   Relieved by:  Nothing Ineffective treatments:  None tried Associated symptoms: dehydration, fatigue and increased appetite   Associated symptoms: no abdominal pain, no chest pain, no diaphoresis, no nausea, no shortness of breath, no vomiting and no weakness   Associated symptoms comment:  Chills, subjective feeling of warmth, potentially fever but does not have fever when he checks it Risk factors: obesity   Risk factors: no pancreatic disease and no pregnancy     Past Medical History:  Diagnosis Date  . Depression   . Diabetes ([place])   . Essential hypertension 02/03/2017  . Hypercholesteremia   . Hypertension     Patient Active Problem List   Diagnosis Date Noted  . Stress 02/08/2017  . Essential hypertension 02/03/2017  . Type 2 diabetes mellitus with complication, without long-term current use of insulin ([place]) 02/03/2017  . Medication overdose 10/31/2013  . Bipolar I disorder, most recent episode depressed ([place]) 09/11/2013  . Tobacco use disorder  09/11/2013  . Newly diagnosed diabetes ([place]) 09/11/2013  . Dehydration 09/11/2013  . Pain, dental 09/11/2013  . DKA (diabetic ketoacidoses) ([place]) 09/10/2013    No past surgical history on file.      Home Medications    Prior to Admission medications   Medication Sig Start Date End Date Taking? Authorizing Provider  amLODipine (NORVASC) 10 MG tablet Take 1 tablet (10 mg total) by mouth daily for 30 days. 10/24/18 11/23/18  Kerin Perna, NP  aspirin 81 MG tablet Take 1 tablet (81 mg total) by mouth daily. 02/03/17   Mack Hook, MD  atorvastatin (LIPITOR) 20 MG tablet Take 1 tablet (20 mg total) by mouth daily. 01/25/19   Kerin Perna, NP  Blood Glucose Monitoring Suppl (AGAMATRIX PRESTO) w/Device KIT Test glucose twice daiy 06/15/16   Mack Hook, MD  glipiZIDE (GLUCOTROL) 10 MG tablet Take 1 tablet (10 mg total) by mouth 2 (two) times daily before a meal for 30 days. 02/05/19 03/07/19  Lonzo Candy, MD  glucose blood [facility] PRESTO TEST) test strip Twice daily blood glucose testing 02/03/17   Mack Hook, MD  glucose blood test strip Use as instructed 06/09/17   Maczis, Barth Kirks, PA-C  ibuprofen (ADVIL,MOTRIN) 200 MG tablet Take 200-400 mg by mouth every 6 (six) hours as needed for moderate pain.    [provider]  Insulin Glargine (LANTUS SOLOSTAR) 100 UNIT/ML Solostar Pen Inject 20 Units into the skin daily. 10/24/18   Kerin Perna, NP  losartan-hydrochlorothiazide (HYZAAR) 100-25 MG tablet Take 1 tablet by mouth daily. 01/25/19   Juluis Mire  P, NP  metFORMIN (GLUCOPHAGE) 1000 MG tablet Take 1 tablet (1,000 mg total) by mouth 2 (two) times daily with a meal. 02/05/19   Lonzo Candy, MD  nicotine (NICODERM CQ) 14 mg/24hr patch Place 1 patch (14 mg total) onto the skin daily. 01/25/19   Kerin Perna, NP  nicotine (NICODERM CQ) 21 mg/24hr patch Place 1 patch (21 mg total) onto the skin daily. 01/25/19   Kerin Perna, NP   nicotine (NICODERM CQ) 7 mg/24hr patch Place 1 patch (7 mg total) onto the skin daily. 01/25/19   Kerin Perna, NP    Family History No family history on file.  Social History Social History   Tobacco Use  . Smoking status: Current Every Day Smoker    Packs/day: 0.50    Years: 14.00    Pack years: 7.00    Types: Cigarettes  . Smokeless tobacco: Never Used  . Tobacco comment: 1/2 pack a day  Substance Use Topics  . Alcohol use: Yes    Alcohol/week: 2.0 standard drinks    Types: 2 Cans of beer per week    Comment: occasional  . Drug use: No    Types: Marijuana    Comment: MJ former user.  Last use in September 2017     Allergies   Lisinopril and Penicillins   Review of Systems Review of Systems  Constitutional: Positive for fatigue. Negative for diaphoresis.  Respiratory: Negative for shortness of breath.   Cardiovascular: Negative for chest pain.  Gastrointestinal: Negative for abdominal pain, nausea and vomiting.  Neurological: Negative for weakness.  All other systems reviewed and are negative.    Physical Exam Updated Vital Signs BP (!) 138/93   Pulse 65   Resp 14   Ht _0  (1.727 m)   Wt 95.7 kg   SpO2 99%   BMI 32.08 kg/m   Physical Exam Vitals signs and nursing note reviewed.  Constitutional:      Appearance: He is well-developed.  HENT:     Head: Normocephalic and atraumatic.     Comments: No obvious facial erythema, facial rash, skin changes to head or neck, no obvious areas of swelling to face    Mouth/Throat:     Comments: Uvula midline No obvious asymmetric palatine elevation No obvious swellings posterior oropharynx No obvious swellings appreciated on neck exam Patient able to move neck in all directions without difficulty, tolerating secretions appropriately  Eyes:     Conjunctiva/sclera: Conjunctivae normal.     Pupils: Pupils are equal, round, and reactive to light.  Neck:     Musculoskeletal: Neck supple. No neck rigidity  or muscular tenderness.  Cardiovascular:     Rate and Rhythm: Normal rate.  Pulmonary:     Effort: Pulmonary effort is normal. No respiratory distress.     Breath sounds: No stridor. No wheezing or rhonchi.  Abdominal:     Palpations: Abdomen is soft.     Tenderness: There is no abdominal tenderness.  Musculoskeletal:     Right lower leg: No edema.     Left lower leg: No edema.  Skin:    General: Skin is warm and dry.  Neurological:     Mental Status: He is alert and oriented to person, place, and time.     Comments: Sensation intact throughout, moves all extremities well      ED Treatments / Results  Labs (all labs ordered are listed, but only abnormal results are displayed) Labs Reviewed  BASIC METABOLIC PANEL -  Abnormal; Notable for the following components:      Result Value   Sodium 132 (*)    Glucose, Bld 337 (*)    Calcium 8.8 (*)    All other components within normal limits  CBC WITH DIFFERENTIAL/PLATELET - Abnormal; Notable for the following components:   MCHC 37.1 (*)    All other components within normal limits  URINALYSIS, ROUTINE W REFLEX MICROSCOPIC - Abnormal; Notable for the following components:   Color, Urine COLORLESS (*)    Glucose, UA >=500 (*)    All other components within normal limits  CBG MONITORING, ED - Abnormal; Notable for the following components:   Glucose-Capillary 375 (*)    All other components within normal limits  CBG MONITORING, ED - Abnormal; Notable for the following components:   Glucose-Capillary 252 (*)    All other components within normal limits  CBG MONITORING, ED - Abnormal; Notable for the following components:   Glucose-Capillary 248 (*)    All other components within normal limits  NOVEL CORONAVIRUS, NAA (HOSPITAL ORDER, SEND-OUT TO REF LAB)  MAGNESIUM  HEPATIC FUNCTION PANEL  TROPONIN I  TROPONIN I    EKG EKG Interpretation  Date/Time:  Sunday February 05 2019 18:08:58 EDT Ventricular Rate:  73 PR Interval:     QRS Duration: 105 QT Interval:  410 QTC Calculation: 452 R Axis:   -21 Text Interpretation:  Sinus rhythm Incomplete RBBB and LAFB RSR' in V1 or V2, right VCD or RVH Nonspecific T abnormalities, lateral leads Minimal ST elevation, inferior leads No significant change since last tracing Confirmed by Gareth Morgan [phone]) on 02/05/2019 7:06:17 PM   Radiology Dg Chest Portable 1 View  Result Date: 02/05/2019 CLINICAL DATA:  Uncontrolled diabetes. EXAM: PORTABLE CHEST 1 VIEW COMPARISON:  09/10/2013 FINDINGS: Cardiomediastinal silhouette is normal. Mediastinal contours appear intact. There is no evidence of focal airspace consolidation, pleural effusion or pneumothorax. Osseous structures are without acute abnormality. Soft tissues are grossly normal. IMPRESSION: No active disease. Electronically Signed   By: Fidela Salisbury M.D.   On: 02/05/2019 16:03    Procedures Procedures (including critical care time)  Medications Ordered in ED Medications  lactated ringers bolus 1,000 mL (0 mLs Intravenous Stopped 02/05/19 1925)  aspirin EC tablet 325 mg (325 mg Oral Given 02/05/19 1800)     Initial Impression / Assessment and Plan / ED Course  I have reviewed the triage vital signs and the nursing notes.  Pertinent labs & imaging results that were available during my care of the patient were reviewed by me and considered in my medical decision making (see chart for details).        Medical Decision Making: Brandon Mcneil is a 38 y.o. male who presented to the ED today with elevated blood sugar, feeling of facial warmth, potentially subjective fever but no elevated temperature when he checks at home, chills.  Past medical history significant for uncontrolled diabetes, hypertension, bipolar 1 disorder, tobacco use, alcohol use  Reviewed and confirmed nursing documentation for past medical history, family history, social history.  On my initial exam, the pt was calm, cooperative,  conversant, follows commands appropriately, GCS 15, not tachycardic, not hypotensive, afebrile, no increased work of breathing or respiratory distress, no signs of impending respiratory failure.   Blood glucose around 400, patient took 15 units of NovoLog at home, will give IV fluids, assess for DKA EKG shows T wave inversions in 2, 3, aVF, V4 through V6, when compared with EKG from 10/31/2013, T  wave inversions in 2, V4 to V6 are new, no obvious reciprocal changes, patient denies any chest pain, any shortness of breath, discussed in more detail the feeling of facial warmth, feeling actually improves with exertion and with movement and activity, no pain left chest, left shoulder, left back, left neck, left arm, no nausea or vomiting, no diaphoresis, no other concerning symptoms for ACS Troponin negative Urged patient to follow-up with PCP for repeat EKG, continued observation and monitoring of EKG changes, patient in agreement with plan moving forward Labs show hyperglycemia without evidence of DKA, no gross electrolyte derangements Case management help work with patient to obtain outpatient prescriptions  All radiology and laboratory studies reviewed independently and with my attending physician, agree with reading provided by radiologist unless otherwise noted.  Upon reassessing patient, patient was calm, resting comfortably, no new complaints Based on the above findings, I believe patient is hemodynamically stable for discharge.  Patient educated about specific return precautions for given chief complaint and symptoms.  Patient educated about follow-up with PCP.  Patient expressed understanding of return precautions and need for follow-up.  Patient discharged.  The above care was discussed with and agreed upon by my attending physician. Emergency Department Medication Summary:  Medications  lactated ringers bolus 1,000 mL (0 mLs Intravenous Stopped 02/05/19 1925)  aspirin EC tablet 325 mg (325  mg Oral Given 02/05/19 1800)   Final Clinical Impressions(s) / ED Diagnoses   Final diagnoses:  Hyperglycemia    ED Discharge Orders         Ordered    glipiZIDE (GLUCOTROL) 10 MG tablet  2 times daily before meals     02/05/19 2017    metFORMIN (GLUCOPHAGE) 1000 MG tablet  2 times daily with meals     02/05/19 2017           Lonzo Candy, MD 02/05/19 2018    Gareth Morgan, MD 02/11/19 1517

## 2019-02-05 NOTE — Discharge Instructions (Addendum)
Advance activities of daily living as tolerated. Stand up and ambulate with assistance until comfortable doing independently.  Advance diet as tolerated based on nausea, vomiting. Start with clear liquids and soft mechanical and advance.  Please return to ED for chest pain, shortness of breath, uncontrolled nausea, vomiting, any new concerning symptoms.  If prescribed any medications please take appropriate dose as prescribed and appropriate frequency has prescribed.  Please follow up with your PCP in 2-4 weeks or sooner if needed.  Please follow-up with your PCP for repeat EKG, further evaluation, care and observation and monitoring of EKG changes when compared with prior

## 2019-02-05 NOTE — ED Triage Notes (Signed)
Pt BIB [facility] for hyperglycemia. Per EMS patient has been having intermittent "hot flashes" and difficulty controlling his CBG at home. EMS reports CBG of 451. Pt reports he went to urgent care on Friday and they told him he was dehydrated. Pt reports he went home and drank some beer. Pt reports he took 15 units of insulin around 1200. Pt also reports he took his blood pressure medication upon EMS arrival. Pt is alert and oriented x4 at present time. Pt denying any pain at present time.

## 2019-02-06 LAB — NOVEL CORONAVIRUS, NAA (HOSP ORDER, SEND-OUT TO REF LAB; TAT 18-24 HRS): SARS-CoV-2, NAA: NOT DETECTED

## 2019-02-27 ENCOUNTER — Other Ambulatory Visit: Payer: Self-pay

## 2019-02-27 ENCOUNTER — Ambulatory Visit (HOSPITAL_COMMUNITY)
Admission: EM | Admit: 2019-02-27 | Discharge: 2019-02-27 | Disposition: A | Discharge status: Home / Self Care | Payer: Self-pay | Attending: Internal Medicine | Admitting: Internal Medicine

## 2019-02-27 ENCOUNTER — Encounter (HOSPITAL_COMMUNITY): Payer: Self-pay

## 2019-02-27 DIAGNOSIS — M542 Cervicalgia: Secondary | ICD-10-CM

## 2019-02-27 DIAGNOSIS — I10 Essential (primary) hypertension: Secondary | ICD-10-CM

## 2019-02-27 LAB — GLUCOSE, CAPILLARY: Glucose-Capillary: 358 mg/dL — ABNORMAL HIGH (ref 70–99)

## 2019-02-27 MED ORDER — NAPROXEN 375 MG PO TABS: 375.0000 mg | ORAL_TABLET | Freq: Two times a day (BID) | ORAL | 0 refills | Status: DC

## 2019-02-27 MED ORDER — KETOROLAC TROMETHAMINE 60 MG/2ML IM SOLN
INTRAMUSCULAR | Status: AC
  Filled 2019-02-27: qty 2

## 2019-02-27 MED ORDER — CYCLOBENZAPRINE HCL 10 MG PO TABS: 10.0000 mg | ORAL_TABLET | Freq: Two times a day (BID) | ORAL | 0 refills | Status: DC | PRN

## 2019-02-27 MED ORDER — KETOROLAC TROMETHAMINE 60 MG/2ML IM SOLN
60.0000 mg | Freq: Once | INTRAMUSCULAR | Status: AC
  Administered 2019-02-27: 60 mg via INTRAMUSCULAR

## 2019-02-27 NOTE — ED Triage Notes (Signed)
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

## 2019-02-27 NOTE — ED Provider Notes (Signed)
[place]    CSN: [id number] Arrival date & time: 02/27/19  1518     History   Chief Complaint Chief Complaint  Patient presents with  . Back Pain  . Neck Pain    HPI Brandon Mcneil is a 38 y.o. 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  Patient thought he may have lifted some heavy cooking where.  He denies any relieving factors.  Pain is severe and aggravated by movement and palpation.  No nausea or vomiting.  No numbness or tingling or weakness in the right upper extremity.   HPI  Past Medical History:  Diagnosis Date  . Depression   . Diabetes ([place])   . Essential hypertension 02/03/2017  . Hypercholesteremia   . Hypertension     Patient Active Problem List   Diagnosis Date Noted  . Stress 02/08/2017  . Essential hypertension 02/03/2017  . Type 2 diabetes mellitus with complication, without long-term current use of insulin ([place]) 02/03/2017  . Medication overdose 10/31/2013  . Bipolar I disorder, most recent episode depressed ([place]) 09/11/2013  . Tobacco use disorder 09/11/2013  . Newly diagnosed diabetes ([place]) 09/11/2013  . Dehydration 09/11/2013  . Pain, dental 09/11/2013  . DKA (diabetic ketoacidoses) ([place]) 09/10/2013    History reviewed. No pertinent surgical history.     Home Medications    Prior to Admission medications   Medication Sig Start Date End Date Taking? Authorizing Provider  amLODipine (NORVASC) 10 MG tablet Take 1 tablet (10 mg total) by mouth daily for 30 days. 10/24/18 11/23/18  Kerin Perna, NP  aspirin 81 MG tablet Take 1 tablet (81 mg total) by mouth daily. 02/03/17   Mack Hook, MD  atorvastatin (LIPITOR) 20 MG tablet Take 1 tablet (20 mg total) by mouth daily. 01/25/19   Kerin Perna, NP  Blood Glucose Monitoring Suppl (AGAMATRIX PRESTO) w/Device KIT Test glucose twice daiy 06/15/16   Mack Hook, MD  cyclobenzaprine (FLEXERIL) 10 MG tablet Take 1 tablet (10 mg total) by mouth 2 (two) times daily as needed for muscle spasms. 02/27/19   Natally Ribera, Myrene Galas, MD  glipiZIDE (GLUCOTROL) 10 MG tablet Take 1 tablet (10 mg total) by mouth 2 (two) times daily before a meal for 30 days. 02/05/19 03/07/19  Lonzo Candy, MD  glucose blood [facility] PRESTO TEST) test strip Twice daily blood glucose testing 02/03/17   Mack Hook, MD  glucose blood test strip Use as instructed 06/09/17   Maczis, Barth Kirks, PA-C  ibuprofen (ADVIL,MOTRIN) 200 MG tablet Take 200-400 mg by mouth every 6 (six) hours as needed for moderate pain.    [provider]  Insulin Glargine (LANTUS SOLOSTAR) 100 UNIT/ML Solostar Pen Inject 20 Units into the skin daily. 10/24/18   Kerin Perna, NP  losartan-hydrochlorothiazide (HYZAAR) 100-25 MG tablet Take 1 tablet by mouth daily. 01/25/19   Kerin Perna, NP  metFORMIN (GLUCOPHAGE) 1000 MG tablet Take 1 tablet (1,000 mg total) by mouth 2 (two) times daily with a meal. 02/05/19   Lonzo Candy, MD  naproxen (NAPROSYN) 375 MG tablet Take 1 tablet (375 mg total) by mouth 2 (two) times daily. 02/27/19   Mashayla Lavin, Myrene Galas, MD  nicotine (NICODERM CQ) 14 mg/24hr patch Place 1 patch (14 mg total) onto the skin daily. 01/25/19   Kerin Perna, NP  nicotine (NICODERM CQ) 21 mg/24hr patch Place  1 patch (21 mg total) onto the skin daily. 01/25/19   Kerin Perna, NP  nicotine (NICODERM CQ) 7 mg/24hr patch Place 1 patch (7 mg total) onto the skin daily. 01/25/19   Kerin Perna, NP    Family History History reviewed. No pertinent family history.  Social History Social History   Tobacco Use  . Smoking status: Current Every Day Smoker    Packs/day: 0.50    Years: 14.00    Pack years: 7.00    Types: Cigarettes  . Smokeless tobacco: Never Used  . Tobacco comment: 1/2 pack a day  Substance Use Topics  . Alcohol use: Yes    Alcohol/week: 2.0  standard drinks    Types: 2 Cans of beer per week    Comment: occasional  . Drug use: No    Types: Marijuana    Comment: MJ former user.  Last use in September 2017     Allergies   Lisinopril and Penicillins   Review of Systems Review of Systems  Constitutional: Positive for activity change. Negative for appetite change, chills, fatigue and fever.  HENT: Negative.   Respiratory: Negative.   Cardiovascular: Negative.   Gastrointestinal: Negative.   Genitourinary: Negative.   Musculoskeletal: Negative.   Skin: Negative.   Neurological: Positive for dizziness, syncope, weakness, light-headedness, numbness and headaches.     Physical Exam Triage Vital Signs ED Triage Vitals  Enc Vitals Group     BP 02/27/19 1616 (!) 148/107     Pulse Rate 02/27/19 1616 87     Resp 02/27/19 1616 18     Temp 02/27/19 1616 98.1 F (36.7 C)     Temp Source 02/27/19 1616 Oral     SpO2 02/27/19 1616 100 %     Weight --      Height --      Head Circumference --      Peak Flow --      Pain Score 02/27/19 1617 10     Pain Loc --      Pain Edu? --      Excl. in [place]? --    No data found.  Updated Vital Signs BP (!) 148/107 (BP Location: Left Arm)   Pulse 87   Temp 98.1 F (36.7 C) (Oral)   Resp 18   SpO2 100%   Visual Acuity Right Eye Distance:   Left Eye Distance:   Bilateral Distance:    Right Eye Near:   Left Eye Near:    Bilateral Near:     Physical Exam Cardiovascular:     Rate and Rhythm: Normal rate and regular rhythm.     Pulses: Normal pulses.     Heart sounds: Normal heart sounds.  Pulmonary:     Effort: Pulmonary effort is normal. No respiratory distress.     Breath sounds: Normal breath sounds. No wheezing or rales.  Abdominal:     General: Bowel sounds are normal. There is no distension.     Palpations: Abdomen is soft. There is no mass.     Tenderness: There is no abdominal tenderness. There is no guarding.  Musculoskeletal:     Comments: Tenderness on  palpation over the right trapezius muscle.  Limited range of motion around the right shoulder secondary to pain.  Power in both hands is 5/5.  Skin:    Capillary Refill: Capillary refill takes less than 2 seconds.  Neurological:     General: No focal deficit present.     Mental  Status: He is oriented to person, place, and time.     Cranial Nerves: No cranial nerve deficit.     Motor: No weakness.     Coordination: Coordination normal.     Gait: Gait abnormal.     Deep Tendon Reflexes: Reflexes normal.      UC Treatments / Results  Labs (all labs ordered are listed, but only abnormal results are displayed) Labs Reviewed  GLUCOSE, CAPILLARY - Abnormal; Notable for the following components:      Result Value   Glucose-Capillary 358 (*)    All other components within normal limits  CBG MONITORING, ED    EKG   Radiology No results found.  Procedures Procedures (including critical care time)  Medications Ordered in UC Medications  ketorolac (TORADOL) injection 60 mg (60 mg Intramuscular Given 02/27/19 1641)  ketorolac (TORADOL) 60 MG/2ML injection (has no administration in time range)    Initial Impression / Assessment and Plan / UC Course  I have reviewed the triage vital signs and the nursing notes.  Pertinent labs & imaging results that were available during my care of the patient were reviewed by me and considered in my medical decision making (see chart for details).     1.  Musculoskeletal pain of the neck and right side of his shoulder Toradol 60 mg IV x1 dose Warm compress over shoulder Range of motion exercises Naproxen/Flexeril to be used as needed If patient experiences worsening symptoms, he is advised to return to urgent care for further management. Final Clinical Impressions(s) / UC Diagnoses   Final diagnoses:  Musculoskeletal neck pain   Discharge Instructions   None    ED Prescriptions    Medication Sig Dispense Auth. Provider   naproxen  (NAPROSYN) 375 MG tablet Take 1 tablet (375 mg total) by mouth 2 (two) times daily. 20 tablet Yuko Coventry, Myrene Galas, MD   cyclobenzaprine (FLEXERIL) 10 MG tablet Take 1 tablet (10 mg total) by mouth 2 (two) times daily as needed for muscle spasms. 20 tablet Trentan Trippe, Myrene Galas, MD     Controlled Substance Prescriptions [place] Controlled Substance Registry consulted? No   Chase Picket, MD 02/28/19 [phone]

## 2019-03-16 MED FILL — LOSARTAN-HCTZ 100-25 MG TAB: 100-25 | 30 days supply | Qty: 30 | Fill #1

## 2019-03-16 MED FILL — ?ATORVASTATIN 20 MG TABLET: 20 | 30 days supply | Qty: 30 | Fill #1

## 2019-05-08 MED FILL — LOSARTAN-HCTZ 100-25 MG TAB: 100-25 | 30 days supply | Qty: 30 | Fill #2

## 2019-05-08 MED FILL — ?ATORVASTATIN 20 MG TABLET: 20 | 30 days supply | Qty: 30 | Fill #2

## 2019-05-10 ENCOUNTER — Encounter (INDEPENDENT_AMBULATORY_CARE_PROVIDER_SITE_OTHER): Payer: Self-pay | Admitting: Primary Care

## 2019-05-10 ENCOUNTER — Ambulatory Visit (INDEPENDENT_AMBULATORY_CARE_PROVIDER_SITE_OTHER): Payer: Self-pay | Admitting: Primary Care

## 2019-05-10 ENCOUNTER — Other Ambulatory Visit: Payer: Self-pay

## 2019-05-10 VITALS — BP 149/88 | HR 72 | Temp 97.1°F | Ht 68.0 in | Wt 217.4 lb

## 2019-05-10 DIAGNOSIS — F5101 Primary insomnia: Secondary | ICD-10-CM

## 2019-05-10 DIAGNOSIS — I10 Essential (primary) hypertension: Secondary | ICD-10-CM

## 2019-05-10 DIAGNOSIS — Z760 Encounter for issue of repeat prescription: Secondary | ICD-10-CM

## 2019-05-10 DIAGNOSIS — E1165 Type 2 diabetes mellitus with hyperglycemia: Secondary | ICD-10-CM

## 2019-05-10 DIAGNOSIS — Z6833 Body mass index (BMI) 33.0-33.9, adult: Secondary | ICD-10-CM

## 2019-05-10 DIAGNOSIS — E669 Obesity, unspecified: Secondary | ICD-10-CM

## 2019-05-10 DIAGNOSIS — Z114 Encounter for screening for human immunodeficiency virus [HIV]: Secondary | ICD-10-CM

## 2019-05-10 DIAGNOSIS — F411 Generalized anxiety disorder: Secondary | ICD-10-CM

## 2019-05-10 DIAGNOSIS — E118 Type 2 diabetes mellitus with unspecified complications: Secondary | ICD-10-CM

## 2019-05-10 LAB — POCT GLYCOSYLATED HEMOGLOBIN (HGB A1C): Hemoglobin A1C: 10.4 % — AB (ref 4.0–5.6)

## 2019-05-10 LAB — GLUCOSE, POCT (MANUAL RESULT ENTRY): POC Glucose: 296 mg/dl — AB (ref 70–99)

## 2019-05-10 MED ORDER — MELATONIN 5 MG PO CAPS: 5.0000 mg | ORAL_CAPSULE | Freq: Every day | ORAL | 3 refills | Status: DC

## 2019-05-10 MED ORDER — ATORVASTATIN CALCIUM 20 MG PO TABS: 20.0000 mg | ORAL_TABLET | Freq: Every day | ORAL | 0 refills | Status: DC

## 2019-05-10 MED ORDER — AMLODIPINE BESYLATE 10 MG PO TABS: 10.0000 mg | ORAL_TABLET | Freq: Every day | ORAL | 0 refills | Status: DC

## 2019-05-10 MED ORDER — LANTUS SOLOSTAR 100 UNIT/ML ~~LOC~~ SOPN: 20.0000 [IU] | PEN_INJECTOR | Freq: Every day | SUBCUTANEOUS | 3 refills | Status: DC

## 2019-05-10 MED ORDER — GLIPIZIDE 10 MG PO TABS: 10.0000 mg | ORAL_TABLET | Freq: Two times a day (BID) | ORAL | 3 refills | Status: DC

## 2019-05-10 MED ORDER — LOSARTAN POTASSIUM-HCTZ 100-25 MG PO TABS: 1.0000 | ORAL_TABLET | Freq: Every day | ORAL | 0 refills | Status: DC

## 2019-05-10 MED ORDER — METFORMIN HCL 1000 MG PO TABS: 1000.0000 mg | ORAL_TABLET | Freq: Two times a day (BID) | ORAL | 3 refills | Status: DC

## 2019-05-10 NOTE — Progress Notes (Signed)
Pt is having trouble falling asleep. States he lays down at 10 pm and does not fall asleep until about 6 or 7 am

## 2019-05-10 NOTE — Progress Notes (Signed)
Established Patient Office Visit  Subjective:  Patient ID: Brandon Mcneil, male    DOB: Apr 26, 1981  Age: 38 y.o. MRN: [id number]  CC:  Chief Complaint  Patient presents with  . Diabetes  . Hypertension  . Back Pain  . Medication Refill    HPI Brandon Mcneil presents for management of hypertension and uncontrolled type 2 diabetes.Elevated Bp on this visit. He denies shortness of breath, headaches, chest pain or lower extremity edema. Unable to monitor blood sugars meter is broken. He is concern with feeling tired and body is restless and unable to fall asleep.Mr. Bowdish has not taken any medications today.   Past Medical History:  Diagnosis Date  . Depression   . Diabetes ([place])   . Essential hypertension 02/03/2017  . Hypercholesteremia   . Hypertension     History reviewed. No pertinent surgical history.  History reviewed. No pertinent family history.  Social History   Socioeconomic History  . Marital status: Single    Spouse name: Not on file  . Number of children: 3  . Years of education: 10th  . Highest education level: Not on file  Occupational History  . Occupation: unemployed  Social Needs  . Financial resource strain: Not on file  . Food insecurity    Worry: Not on file    Inability: Not on file  . Transportation needs    Medical: Not on file    Non-medical: Not on file  Tobacco Use  . Smoking status: Current Every Day Smoker    Packs/day: 0.50    Years: 14.00    Pack years: 7.00    Types: Cigarettes  . Smokeless tobacco: Never Used  . Tobacco comment: 1/2 pack a day  Substance and Sexual Activity  . Alcohol use: Yes    Alcohol/week: 2.0 standard drinks    Types: 2 Cans of beer per week    Comment: occasional  . Drug use: No    Types: Marijuana    Comment: MJ former user.  Last use in September 2017  . Sexual activity: Yes    Partners: Female    Birth control/protection: None  Lifestyle  . Physical activity    Days per week: Not  on file    Minutes per session: Not on file  . Stress: Not on file  Relationships  . Social Herbalist on phone: Not on file    Gets together: Not on file    Attends religious service: Not on file    Active member of club or organization: Not on file    Attends meetings of clubs or organizations: Not on file    Relationship status: Not on file  . Intimate partner violence    Fear of current or ex partner: Not on file    Emotionally abused: Not on file    Physically abused: Not on file    Forced sexual activity: Not on file  Other Topics Concern  . 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    Outpatient Medications Prior to Visit  Medication Sig Dispense Refill  . aspirin 81 MG tablet Take 1 tablet (81 mg total) by mouth daily. 90 tablet 3  . Blood Glucose Monitoring Suppl (AGAMATRIX PRESTO) w/Device KIT Test glucose twice daiy 1 kit 0  . glucose blood (AGAMATRIX PRESTO  TEST) test strip Twice daily blood glucose testing 200 each 3  . glucose blood test strip Use as instructed 100 each 0  . ibuprofen (ADVIL,MOTRIN) 200 MG tablet Take 200-400 mg by mouth every 6 (six) hours as needed for moderate pain.    Marland Kitchen atorvastatin (LIPITOR) 20 MG tablet Take 1 tablet (20 mg total) by mouth daily. 90 tablet 3  . Insulin Glargine (LANTUS SOLOSTAR) 100 UNIT/ML Solostar Pen Inject 20 Units into the skin daily. 5 pen PRN  . losartan-hydrochlorothiazide (HYZAAR) 100-25 MG tablet Take 1 tablet by mouth daily. 90 tablet 3  . metFORMIN (GLUCOPHAGE) 1000 MG tablet Take 1 tablet (1,000 mg total) by mouth 2 (two) times daily with a meal. 60 tablet 0  . cyclobenzaprine (FLEXERIL) 10 MG tablet Take 1 tablet (10 mg total) by mouth 2 (two) times daily as needed for muscle spasms. (Patient not taking: Reported on 05/10/2019) 20 tablet 0  . naproxen (NAPROSYN) 375 MG tablet Take 1  tablet (375 mg total) by mouth 2 (two) times daily. (Patient not taking: Reported on 05/10/2019) 20 tablet 0  . nicotine (NICODERM CQ) 14 mg/24hr patch Place 1 patch (14 mg total) onto the skin daily. (Patient not taking: Reported on 05/10/2019) 28 patch 0  . nicotine (NICODERM CQ) 21 mg/24hr patch Place 1 patch (21 mg total) onto the skin daily. (Patient not taking: Reported on 05/10/2019) 28 patch 0  . nicotine (NICODERM CQ) 7 mg/24hr patch Place 1 patch (7 mg total) onto the skin daily. 28 patch 0  . amLODipine (NORVASC) 10 MG tablet Take 1 tablet (10 mg total) by mouth daily for 30 days. 30 tablet 3  . glipiZIDE (GLUCOTROL) 10 MG tablet Take 1 tablet (10 mg total) by mouth 2 (two) times daily before a meal for 30 days. 60 tablet 0   No facility-administered medications prior to visit.     Allergies  Allergen Reactions  . Lisinopril Swelling    Angioedema  . Penicillins Itching and Rash    ROS Review of Systems  All other systems reviewed and are negative.     Objective:    Physical Exam  Constitutional: He is oriented to person, place, and time. He appears well-developed and well-nourished.  HENT:  Head: Normocephalic and atraumatic.  Eyes: Pupils are equal, round, and reactive to light. EOM are normal.  Neck: Normal range of motion. Neck supple.  Cardiovascular: Normal rate and regular rhythm.  Pulmonary/Chest: Effort normal. He has wheezes.  Abdominal: Soft. Bowel sounds are normal.  Musculoskeletal: Normal range of motion.  Neurological: He is alert and oriented to person, place, and time.  Skin: Skin is warm.  Psychiatric: He has a normal mood and affect.    BP (!) 149/88 (BP Location: Left Arm, Patient Position: Sitting, Cuff Size: Normal)   Pulse 72   Temp (!) 97.1 F (36.2 C) (Tympanic)   Ht '5\' 8"'  (1.727 m)   Wt 217 lb 6.4 oz (98.6 kg)   SpO2 99%   BMI 33.06 kg/m  Wt Readings from Last 3 Encounters:  05/10/19 217 lb 6.4 oz (98.6 kg)  02/05/19 211 lb (95.7  kg)  01/25/19 214 lb 3.2 oz (97.2 kg)     Health Maintenance Due  Topic Date Due  . OPHTHALMOLOGY EXAM  12/09/1988    There are no preventive care reminders to display for this patient.  No results found for: TSH Lab Results  Component Value Date   WBC 5.7 05/10/2019   HGB 14.4  05/10/2019   HCT 43.7 05/10/2019   MCV 95 05/10/2019   PLT 246 05/10/2019   Lab Results  Component Value Date   NA 134 05/10/2019   K 4.2 05/10/2019   CO2 27 05/10/2019   GLUCOSE 322 (H) 05/10/2019   BUN 21 05/10/2019   CREATININE 1.05 05/10/2019   BILITOT 0.4 05/10/2019   ALKPHOS 88 05/10/2019   AST 16 05/10/2019   ALT 13 05/10/2019   PROT 7.3 05/10/2019   ALBUMIN 4.0 05/10/2019   CALCIUM 9.2 05/10/2019   ANIONGAP 9 02/05/2019   Lab Results  Component Value Date   CHOL 153 05/10/2019   Lab Results  Component Value Date   HDL 44 05/10/2019   Lab Results  Component Value Date   LDLCALC 91 10/24/2018   Lab Results  Component Value Date   TRIG 89 05/10/2019   Lab Results  Component Value Date   CHOLHDL 3.5 05/10/2019   Lab Results  Component Value Date   HGBA1C 10.4 (A) 05/10/2019      Assessment & Plan:  S[place] was seen today for diabetes, hypertension, back pain and medication refill.  Diagnoses and all orders for this visit:  Type 2 diabetes mellitus with complication, without long-term current use of insulin ([place]) ADA recommends the following therapeutic goals for glycemic control related to A1c measurements: Goal of therapy: Less than 6.5 hemoglobin A1c.  Reference clinical practice recommendations. Foods that are high in carbohydrates are the following rice, potatoes, breads, sugars, and pastas.  Reduction in the intake (eating) will assist in lowering your blood sugars. -     HgB A1c -     Glucose (CBG) -     Ambulatory referral to Ophthalmology -     Lipid Panel -     CBC with Differential -     CMP14+EGFR  Screening for HIV (human immunodeficiency virus) -   HIV Antibody (routine testing w rflx)  Adult BMI 33.0-33.9 kg/sq m Obesity is 30-39 indicating an excess in caloric intake or underlining conditions. This may lead to other co-morbidities. Lifestyle modifications of diet and exercise may reduce obesity.   Generalized anxiety disorder  We discussed options for treatment of anxiety including therapy and/or medication.  Will check basic labs to ensure thyroid is in normal range and that no other metabolic issues are obvious.  Reviewed concept of anxiety as biochemical imbalance of neurotransmitters and rationale for treatment. Discussed potential risks, expected benefits, possible side effects of the medicine. We also discussed how to take it correctly and dosing instructions. If he has any significant side effects to the medicine, he is to stop it and call for advice.  Instructed patient to contact office or on-call physician promptly should condition worsen or any new symptoms appear.    He was agreeable with this plan.   Essential hypertension, benign Counseled on blood pressure goal of less than 130/80, low-sodium, DASH diet, medication compliance, 150 minutes of moderate intensity exercise per week. Discussed medication compliance, adverse effects. -     losartan-hydrochlorothiazide (HYZAAR) 100-25 MG tablet; Take 1 tablet by mouth daily.  Primary insomnia Can try melatonin 54m-15 mg at night for sleep, can also do benadryl 25-520mat night for sleep.  If this does not help we can try prescription medication.  Also here is some information about good sleep hygiene.   Insomnia Insomnia is frequent trouble falling and/or staying asleep. Insomnia can be a long term problem or a short term problem. Both are common. Insomnia  can be a short term problem when the wakefulness is related to a certain stress or worry. Long term insomnia is often related to ongoing stress during waking hours and/or poor sleeping habits. Overtime, sleep deprivation  itself can make the problem worse. Every little thing feels more severe because you are overtired and your ability to cope is decreased. CAUSES   Stress, anxiety, and depression.  Poor sleeping habits.  Distractions such as TV in the bedroom.  Naps close to bedtime.  Engaging in emotionally charged conversations before bed.  Technical reading before sleep.  Alcohol and other sedatives. They may make the problem worse. They can hurt normal sleep patterns and normal dream activity.  Stimulants such as caffeine for several hours prior to bedtime.  Pain syndromes and shortness of breath can cause insomnia.  Exercise late at night.  Changing time zones may cause sleeping problems (jet lag). It is sometimes helpful to have someone observe your sleeping patterns. They should look for periods of not breathing during the night (sleep apnea). They should also look to see how long those periods last. If you live alone or observers are uncertain, you can also be observed at a sleep clinic where your sleep patterns will be professionally monitored. Sleep apnea requires a checkup and treatment. Give your caregivers your medical history. Give your caregivers observations your family has made about your sleep.  SYMPTOMS   Not feeling rested in the morning.  Anxiety and restlessness at bedtime.  Difficulty falling and staying asleep. TREATMENT   Your caregiver may prescribe treatment for an underlying medical disorders. Your caregiver can give advice or help if you are using alcohol or other drugs for self-medication. Treatment of underlying problems will usually eliminate insomnia problems.  Medications can be prescribed for short time use. They are generally not recommended for lengthy use.  Over-the-counter sleep medicines are not recommended for lengthy use. They can be habit forming.  You can promote easier sleeping by making lifestyle changes such as:  Using relaxation techniques that  help with breathing and reduce muscle tension.  Exercising earlier in the day.  Changing your diet and the time of your last meal. No night time snacks.  Establish a regular time to go to bed.  Counseling can help with stressful problems and worry.  Soothing music and white noise may be helpful if there are background noises you cannot remove.  Stop tedious detailed work at least one hour before bedtime. HOME CARE INSTRUCTIONS   Keep a diary. Inform your caregiver about your progress. This includes any medication side effects. See your caregiver regularly. Take note of:  Times when you are asleep.  Times when you are awake during the night.  The quality of your sleep.  How you feel the next day. This information will help your caregiver care for you.  Get out of bed if you are still awake after 15 minutes. Read or do some quiet activity. Keep the lights down. Wait until you feel sleepy and go back to bed.  Keep regular sleeping and waking hours. Avoid naps.  Exercise regularly.  Avoid distractions at bedtime. Distractions include watching television or engaging in any intense or detailed activity like attempting to balance the household checkbook.  Develop a bedtime ritual. Keep a familiar routine of bathing, brushing your teeth, climbing into bed at the same time each night, listening to soothing music. Routines increase the success of falling to sleep faster.  Use relaxation techniques. This can be using  breathing and muscle tension release routines. It can also include visualizing peaceful scenes. You can also help control troubling or intruding thoughts by keeping your mind occupied with boring or repetitive thoughts like the old concept of counting sheep. You can make it more creative like imagining planting one beautiful flower after another in your backyard garden.  During your day, work to eliminate stress. When this is not possible use some of the previous suggestions  to help reduce the anxiety that accompanies stressful situations. MAKE SURE YOU:   Understand these instructions.  Will watch your condition.  Will get help right away if you are not doing well or get worse. Document Released: 07/31/2000 Document Revised: 10/26/2011 Document Reviewed: 08/31/2007 [facility] Patient Information 2015 [place], [place]. This information is not intended to replace advice given to you by your health care provider. Make sure you discuss any questions you have with your health care provider.  Other orders/Medication refills -     metFORMIN (GLUCOPHAGE) 1000 MG tablet; Take 1 tablet (1,000 mg total) by mouth 2 (two) times daily with a meal. -     glipiZIDE (GLUCOTROL) 10 MG tablet; Take 1 tablet (10 mg total) by mouth 2 (two) times daily before a meal. -     amLODipine (NORVASC) 10 MG tablet; Take 1 tablet (10 mg total) by mouth daily. -     atorvastatin (LIPITOR) 20 MG tablet; Take 1 tablet (20 mg total) by mouth daily. -     Insulin Glargine (LANTUS SOLOSTAR) 100 UNIT/ML Solostar Pen; Inject 20 Units into the skin daily. -     Melatonin 5 MG CAPS; Take 1 capsule (5 mg total) by mouth at bedtime.    Meds ordered this encounter  Medications  . losartan-hydrochlorothiazide (HYZAAR) 100-25 MG tablet    Sig: Take 1 tablet by mouth daily.    Dispense:  90 tablet    Refill:  0  . metFORMIN (GLUCOPHAGE) 1000 MG tablet    Sig: Take 1 tablet (1,000 mg total) by mouth 2 (two) times daily with a meal.    Dispense:  60 tablet    Refill:  3  . glipiZIDE (GLUCOTROL) 10 MG tablet    Sig: Take 1 tablet (10 mg total) by mouth 2 (two) times daily before a meal.    Dispense:  60 tablet    Refill:  3  . amLODipine (NORVASC) 10 MG tablet    Sig: Take 1 tablet (10 mg total) by mouth daily.    Dispense:  30 tablet    Refill:  0  . atorvastatin (LIPITOR) 20 MG tablet    Sig: Take 1 tablet (20 mg total) by mouth daily.    Dispense:  90 tablet    Refill:  0  . Insulin Glargine  (LANTUS SOLOSTAR) 100 UNIT/ML Solostar Pen    Sig: Inject 20 Units into the skin daily.    Dispense:  5 pen    Refill:  3  . Melatonin 5 MG CAPS    Sig: Take 1 capsule (5 mg total) by mouth at bedtime.    Dispense:  30 capsule    Refill:  3    Follow-up: Return in about 3 months (around 08/09/2019) for HTN/DM.    Kerin Perna, NP

## 2019-05-10 NOTE — Patient Instructions (Signed)

## 2019-05-11 LAB — CBC WITH DIFFERENTIAL/PLATELET
Basophils Absolute: 0 10*3/uL (ref 0.0–0.2)
Basos: 1 %
EOS (ABSOLUTE): 0.1 10*3/uL (ref 0.0–0.4)
Eos: 2 %
Hematocrit: 43.7 % (ref 37.5–51.0)
Hemoglobin: 14.4 g/dL (ref 13.0–17.7)
Immature Grans (Abs): 0 10*3/uL (ref 0.0–0.1)
Immature Granulocytes: 1 %
Lymphocytes Absolute: 2.1 10*3/uL (ref 0.7–3.1)
Lymphs: 36 %
MCH: 31.2 pg (ref 26.6–33.0)
MCHC: 33 g/dL (ref 31.5–35.7)
MCV: 95 fL (ref 79–97)
Monocytes Absolute: 0.8 10*3/uL (ref 0.1–0.9)
Monocytes: 13 %
Neutrophils Absolute: 2.7 10*3/uL (ref 1.4–7.0)
Neutrophils: 47 %
Platelets: 246 10*3/uL (ref 150–450)
RBC: 4.61 x10E6/uL (ref 4.14–5.80)
RDW: 13.2 % (ref 11.6–15.4)
WBC: 5.7 10*3/uL (ref 3.4–10.8)

## 2019-05-11 LAB — CMP14+EGFR
ALT: 13 IU/L (ref 0–44)
AST: 16 IU/L (ref 0–40)
Albumin/Globulin Ratio: 1.2 (ref 1.2–2.2)
Albumin: 4 g/dL (ref 4.0–5.0)
Alkaline Phosphatase: 88 IU/L (ref 39–117)
BUN/Creatinine Ratio: 20 (ref 9–20)
BUN: 21 mg/dL (ref 6–24)
Bilirubin Total: 0.4 mg/dL (ref 0.0–1.2)
CO2: 27 mmol/L (ref 20–29)
Calcium: 9.2 mg/dL (ref 8.7–10.2)
Chloride: 97 mmol/L (ref 96–106)
Creatinine, Ser: 1.05 mg/dL (ref 0.76–1.27)
GFR calc Af Amer: 102 mL/min/{1.73_m2} (ref >59)
GFR calc non Af Amer: 88 mL/min/{1.73_m2} (ref >59)
Globulin, Total: 3.3 g/dL (ref 1.5–4.5)
Glucose: 322 mg/dL — ABNORMAL HIGH (ref 65–99)
Potassium: 4.2 mmol/L (ref 3.5–5.2)
Sodium: 134 mmol/L (ref 134–144)
Total Protein: 7.3 g/dL (ref 6.0–8.5)

## 2019-05-11 LAB — LIPID PANEL
Chol/HDL Ratio: 3.5 ratio (ref 0.0–5.0)
Cholesterol, Total: 153 mg/dL (ref 100–199)
HDL: 44 mg/dL (ref >39)
LDL Chol Calc (NIH): 92 mg/dL (ref 0–99)
Triglycerides: 89 mg/dL (ref 0–149)
VLDL Cholesterol Cal: 17 mg/dL (ref 5–40)

## 2019-05-11 LAB — HIV ANTIBODY (ROUTINE TESTING W REFLEX): HIV Screen 4th Generation wRfx: NONREACTIVE

## 2019-07-07 ENCOUNTER — Ambulatory Visit (HOSPITAL_COMMUNITY)
Admission: EM | Admit: 2019-07-07 | Discharge: 2019-07-07 | Disposition: A | Discharge status: Home / Self Care | Payer: HRSA Program | Attending: Urgent Care | Admitting: Urgent Care

## 2019-07-07 ENCOUNTER — Other Ambulatory Visit: Payer: Self-pay

## 2019-07-07 ENCOUNTER — Encounter (HOSPITAL_COMMUNITY): Payer: Self-pay | Admitting: Urgent Care

## 2019-07-07 DIAGNOSIS — R040 Epistaxis: Secondary | ICD-10-CM | POA: Diagnosis present

## 2019-07-07 DIAGNOSIS — J0100 Acute maxillary sinusitis, unspecified: Secondary | ICD-10-CM | POA: Diagnosis present

## 2019-07-07 DIAGNOSIS — R05 Cough: Secondary | ICD-10-CM | POA: Diagnosis present

## 2019-07-07 DIAGNOSIS — E1165 Type 2 diabetes mellitus with hyperglycemia: Secondary | ICD-10-CM | POA: Insufficient documentation

## 2019-07-07 DIAGNOSIS — Z20828 Contact with and (suspected) exposure to other viral communicable diseases: Secondary | ICD-10-CM | POA: Diagnosis present

## 2019-07-07 DIAGNOSIS — I160 Hypertensive urgency: Secondary | ICD-10-CM | POA: Insufficient documentation

## 2019-07-07 DIAGNOSIS — R0981 Nasal congestion: Secondary | ICD-10-CM | POA: Diagnosis present

## 2019-07-07 DIAGNOSIS — R058 Other specified cough: Secondary | ICD-10-CM

## 2019-07-07 MED ORDER — DOXYCYCLINE HYCLATE 100 MG PO CAPS: 100.0000 mg | ORAL_CAPSULE | Freq: Two times a day (BID) | ORAL | 0 refills | Status: DC

## 2019-07-07 MED ORDER — AMLODIPINE BESYLATE 10 MG PO TABS: 10.0000 mg | ORAL_TABLET | Freq: Every day | ORAL | 0 refills | Status: DC

## 2019-07-07 MED ORDER — HYDROCHLOROTHIAZIDE 25 MG PO TABS: 25.0000 mg | ORAL_TABLET | Freq: Every day | ORAL | 0 refills | Status: DC

## 2019-07-07 NOTE — Discharge Instructions (Addendum)
We will manage your symptoms for sinusitis. However, there is a possibility that your symptoms may change or worsen as some emergencies develop over time. If you develop chest pain and you have sweating, belly pain, shortness of breath, nausea, vomiting, headache then call 911 as it may be a life threatening event.    Please stop using your mother's blood pressure medication. Start your own HCTZ, pick up your new script for amlodipine. These are both blood pressure medications you should be on. Cut back on your alcohol use.   For elevated blood pressure, make sure you are monitoring salt in your diet.  Do not eat restaurant foods and limit processed foods at home.  Processed foods include things like frozen meals preseason meats and dinners.  Make sure your pain attention to sodium labels on foods you by at the grocery store.  For seasoning you can use a brand called Mrs. Dash which includes a lot of salt free seasonings.  Salads - kale, spinach, cabbage, spring mix; use seeds like pumpkin seeds or sunflower seeds, almonds Fruits - avocadoes, berries (blueberries, raspberries, blackberries), apples, oranges, pomegranate, grapefruit Vegetables - aspargus, cauliflower, broccoli, green beans, brussel spouts, bell peppers; stay away from starchy vegetables like potatoes, carrots, peas  Regarding meat it is better to eat lean meats and limit your red meat consumption including pork.  Wild caught fish, chicken breast are good options.  Do not eat any foods on this list that you are allergic to.

## 2019-07-07 NOTE — ED Provider Notes (Signed)
[place]   MRN: [id number] DOB: 1980-12-01  Subjective:   Brandon Mcneil is a 38 y.o. male presenting for 2-3 week history of persistent sinus congestion, sinus drainage, productive cough. Developed a headache yesterday and today, it has since resolved. Had a frontal headache, nose bleed that is improved.  He is not taking amlodipine and instead is using his mother's medication (HCTZ). He is supposed to be taking amlodipine but has not picked this up yet. Takes Lantus and glipizide for his diabetes. Blood sugar is generally in the 200s.   No current facility-administered medications for this encounter.   Current Outpatient Medications:  .  amLODipine (NORVASC) 10 MG tablet, Take 1 tablet (10 mg total) by mouth daily., Disp: 30 tablet, Rfl: 0 .  aspirin 81 MG tablet, Take 1 tablet (81 mg total) by mouth daily., Disp: 90 tablet, Rfl: 3 .  atorvastatin (LIPITOR) 20 MG tablet, Take 1 tablet (20 mg total) by mouth daily., Disp: 90 tablet, Rfl: 0 .  Blood Glucose Monitoring Suppl (AGAMATRIX PRESTO) w/Device KIT, Test glucose twice daiy, Disp: 1 kit, Rfl: 0 .  cyclobenzaprine (FLEXERIL) 10 MG tablet, Take 1 tablet (10 mg total) by mouth 2 (two) times daily as needed for muscle spasms. (Patient not taking: Reported on 05/10/2019), Disp: 20 tablet, Rfl: 0 .  glipiZIDE (GLUCOTROL) 10 MG tablet, Take 1 tablet (10 mg total) by mouth 2 (two) times daily before a meal., Disp: 60 tablet, Rfl: 3 .  glucose blood (AGAMATRIX PRESTO TEST) test strip, Twice daily blood glucose testing, Disp: 200 each, Rfl: 3 .  glucose blood test strip, Use as instructed, Disp: 100 each, Rfl: 0 .  ibuprofen (ADVIL,MOTRIN) 200 MG tablet, Take 200-400 mg by mouth every 6 (six) hours as needed for moderate pain., Disp: , Rfl:  .  Insulin Glargine (LANTUS SOLOSTAR) 100 UNIT/ML Solostar Pen, Inject 20 Units into the skin daily., Disp: 5 pen, Rfl: 3 .  losartan-hydrochlorothiazide (HYZAAR) 100-25 MG tablet, Take 1 tablet  by mouth daily., Disp: 90 tablet, Rfl: 0 .  Melatonin 5 MG CAPS, Take 1 capsule (5 mg total) by mouth at bedtime., Disp: 30 capsule, Rfl: 3 .  metFORMIN (GLUCOPHAGE) 1000 MG tablet, Take 1 tablet (1,000 mg total) by mouth 2 (two) times daily with a meal., Disp: 60 tablet, Rfl: 3 .  naproxen (NAPROSYN) 375 MG tablet, Take 1 tablet (375 mg total) by mouth 2 (two) times daily. (Patient not taking: Reported on 05/10/2019), Disp: 20 tablet, Rfl: 0 .  nicotine (NICODERM CQ) 14 mg/24hr patch, Place 1 patch (14 mg total) onto the skin daily. (Patient not taking: Reported on 05/10/2019), Disp: 28 patch, Rfl: 0 .  nicotine (NICODERM CQ) 21 mg/24hr patch, Place 1 patch (21 mg total) onto the skin daily. (Patient not taking: Reported on 05/10/2019), Disp: 28 patch, Rfl: 0 .  nicotine (NICODERM CQ) 7 mg/24hr patch, Place 1 patch (7 mg total) onto the skin daily., Disp: 28 patch, Rfl: 0   Allergies  Allergen Reactions  . Lisinopril Swelling    Angioedema  . Penicillins Itching and Rash    Past Medical History:  Diagnosis Date  . Depression   . Diabetes ([place])   . Essential hypertension 02/03/2017  . Hypercholesteremia   . Hypertension      No past surgical history on file.  No family history on file.  Social History   Tobacco Use  . Smoking status: Current Every Day Smoker    Packs/day: 0.50  Years: 14.00    Pack years: 7.00    Types: Cigarettes  . Smokeless tobacco: Never Used  . Tobacco comment: 1/2 pack a day  Substance Use Topics  . Alcohol use: Yes    Alcohol/week: 2.0 standard drinks    Types: 2 Cans of beer per week    Comment: occasional  . Drug use: No    Types: Marijuana    Comment: [employer] former user.  Last use in September 2017    Review of Systems  Constitutional: Positive for malaise/fatigue. Negative for fever.  HENT: Positive for congestion, nosebleeds and sinus pain. Negative for ear pain and sore throat.   Eyes: Negative for blurred vision, double vision, discharge and  redness.  Respiratory: Positive for cough. Negative for hemoptysis, shortness of breath and wheezing.   Cardiovascular: Negative for chest pain.  Gastrointestinal: Negative for abdominal pain, diarrhea, nausea and vomiting.  Genitourinary: Negative for dysuria, flank pain and hematuria.  Musculoskeletal: Negative for myalgias.  Skin: Negative for rash.  Neurological: Positive for headaches. Negative for dizziness, tingling, speech change, focal weakness and weakness.  Psychiatric/Behavioral: Negative for depression and substance abuse.     Objective:   Vitals: BP (!) 184/117 (BP Location: Right Arm) Comment: has not taken blood pressure medication in a few days  Pulse 87   Temp 98 F (36.7 C) (Oral)   Resp 16   SpO2 97%   BP was 170/107 on recheck.   Physical Exam Constitutional:      General: He is not in acute distress.    Appearance: Normal appearance. He is well-developed. He is not ill-appearing, toxic-appearing or diaphoretic.  HENT:     Head: Normocephalic and atraumatic.     Right Ear: External ear normal.     Left Ear: External ear normal.     Nose: Congestion and rhinorrhea present.     Right Sinus: Maxillary sinus tenderness present.     Left Sinus: Maxillary sinus tenderness present.     Mouth/Throat:     Mouth: Mucous membranes are dry.  Eyes:     General: No scleral icterus.       Right eye: No discharge.        Left eye: No discharge.     Extraocular Movements: Extraocular movements intact.     Pupils: Pupils are equal, round, and reactive to light.  Neck:     Musculoskeletal: Normal range of motion and neck supple. No neck rigidity or muscular tenderness.     Vascular: No carotid bruit.  Cardiovascular:     Rate and Rhythm: Normal rate and regular rhythm.     Heart sounds: Normal heart sounds. No murmur. No friction rub. No gallop.   Pulmonary:     Effort: Pulmonary effort is normal. No respiratory distress.     Breath sounds: Normal breath sounds.  No stridor. No wheezing, rhonchi or rales.  Abdominal:     General: Bowel sounds are normal. There is no distension.     Palpations: Abdomen is soft.     Tenderness: There is no abdominal tenderness. There is no guarding or rebound.  Musculoskeletal:     Right lower leg: No edema.     Left lower leg: No edema.  Lymphadenopathy:     Cervical: No cervical adenopathy.  Skin:    General: Skin is warm and dry.  Neurological:     Mental Status: He is alert and oriented to person, place, and time.     Cranial Nerves: No  cranial nerve deficit.     Motor: No weakness.     Coordination: Coordination normal.     Gait: Gait normal.     Deep Tendon Reflexes: Reflexes normal.  Psychiatric:        Mood and Affect: Mood normal.        Behavior: Behavior normal.        Thought Content: Thought content normal.        Judgment: Judgment normal.      Assessment and Plan :   1. Acute maxillary sinusitis, recurrence not specified   2. Sinus congestion   3. Productive cough   4. Epistaxis   5. Cough with exposure to COVID-19 virus   6. Uncontrolled type 2 diabetes mellitus with hyperglycemia ([place])   7. Hypertensive urgency     Will cover for sinusitis with doxycycline. Covid 19 testing pending. Had extensive discussion about medical non-compliance. Right now patient does not endorse headache, cp, confusion, dizziness, abdominal pain. Discussed strict ER precautions, need for f/u with PCP asap. Recommended efforts at smoking cessation and cutting back on alcohol use. Counseled patient on potential for adverse effects with medications prescribed/recommended today, ER and return-to-clinic precautions discussed, patient verbalized understanding.    Jaynee Eagles, PA-C 07/07/19 1423

## 2019-07-07 NOTE — ED Triage Notes (Signed)
Pt presents with upper respiratory cold; non productive cough, nasal drainage, and congestion X 2 weeks.  Pt also presents with nosebleed X 2 days that he believes to be associated with elevated blood pressure.

## 2019-07-08 LAB — NOVEL CORONAVIRUS, NAA (HOSP ORDER, SEND-OUT TO REF LAB; TAT 18-24 HRS): SARS-CoV-2, NAA: NOT DETECTED

## 2019-08-09 ENCOUNTER — Other Ambulatory Visit: Payer: Self-pay | Admitting: Pharmacist

## 2019-08-09 ENCOUNTER — Encounter (INDEPENDENT_AMBULATORY_CARE_PROVIDER_SITE_OTHER): Payer: Self-pay | Admitting: Primary Care

## 2019-08-09 ENCOUNTER — Other Ambulatory Visit: Payer: Self-pay

## 2019-08-09 ENCOUNTER — Ambulatory Visit (INDEPENDENT_AMBULATORY_CARE_PROVIDER_SITE_OTHER): Payer: Self-pay | Admitting: Primary Care

## 2019-08-09 VITALS — BP 162/89 | HR 94 | Temp 97.3°F | Ht 68.0 in | Wt 222.0 lb

## 2019-08-09 DIAGNOSIS — F17210 Nicotine dependence, cigarettes, uncomplicated: Secondary | ICD-10-CM

## 2019-08-09 DIAGNOSIS — E1165 Type 2 diabetes mellitus with hyperglycemia: Secondary | ICD-10-CM

## 2019-08-09 DIAGNOSIS — F17200 Nicotine dependence, unspecified, uncomplicated: Secondary | ICD-10-CM

## 2019-08-09 DIAGNOSIS — Z760 Encounter for issue of repeat prescription: Secondary | ICD-10-CM

## 2019-08-09 DIAGNOSIS — Z6833 Body mass index (BMI) 33.0-33.9, adult: Secondary | ICD-10-CM

## 2019-08-09 DIAGNOSIS — E118 Type 2 diabetes mellitus with unspecified complications: Secondary | ICD-10-CM

## 2019-08-09 DIAGNOSIS — IMO0002 Reserved for concepts with insufficient information to code with codable children: Secondary | ICD-10-CM

## 2019-08-09 DIAGNOSIS — I10 Essential (primary) hypertension: Secondary | ICD-10-CM

## 2019-08-09 DIAGNOSIS — E669 Obesity, unspecified: Secondary | ICD-10-CM

## 2019-08-09 LAB — POCT GLYCOSYLATED HEMOGLOBIN (HGB A1C): Hemoglobin A1C: 10.3 % — AB (ref 4.0–5.6)

## 2019-08-09 LAB — GLUCOSE, POCT (MANUAL RESULT ENTRY): POC Glucose: 290 mg/dl — AB (ref 70–99)

## 2019-08-09 MED ORDER — GLIPIZIDE 10 MG PO TABS: 10.0000 mg | ORAL_TABLET | Freq: Two times a day (BID) | ORAL | 5 refills | Status: DC

## 2019-08-09 MED ORDER — TRUE METRIX METER DEVI: 1.0000 | Freq: Every day | 0 refills | Status: DC

## 2019-08-09 MED ORDER — LANTUS SOLOSTAR 100 UNIT/ML ~~LOC~~ SOPN: 20.0000 [IU] | PEN_INJECTOR | Freq: Every day | SUBCUTANEOUS | 5 refills | Status: DC

## 2019-08-09 MED ORDER — TRUE METRIX BLOOD GLUCOSE TEST VI STRP: ORAL_STRIP | 11 refills | Status: DC

## 2019-08-09 MED ORDER — ATORVASTATIN CALCIUM 20 MG PO TABS: 20.0000 mg | ORAL_TABLET | Freq: Every day | ORAL | 0 refills | Status: DC

## 2019-08-09 MED ORDER — AMLODIPINE BESYLATE 10 MG PO TABS: 10.0000 mg | ORAL_TABLET | Freq: Every day | ORAL | 5 refills | Status: DC

## 2019-08-09 MED ORDER — HYDROCHLOROTHIAZIDE 25 MG PO TABS: 25.0000 mg | ORAL_TABLET | Freq: Every day | ORAL | 5 refills | Status: DC

## 2019-08-09 MED ORDER — AGAMATRIX PRESTO TEST VI STRP: ORAL_STRIP | 3 refills | Status: DC

## 2019-08-09 MED ORDER — METFORMIN HCL 1000 MG PO TABS: 1000.0000 mg | ORAL_TABLET | Freq: Two times a day (BID) | ORAL | 5 refills | Status: DC

## 2019-08-09 MED ORDER — TRUEPLUS LANCETS 28G MISC: 11 refills | Status: DC

## 2019-08-09 MED ORDER — LOSARTAN POTASSIUM 100 MG PO TABS: 50.0000 mg | ORAL_TABLET | Freq: Every day | ORAL | 5 refills | Status: DC

## 2019-08-09 MED FILL — ?AMLODIPINE BESYLATE 10 MG: 10 | 30 days supply | Qty: 30 | Fill #0

## 2019-08-09 MED FILL — ?BASAGLAR 100 UNITS/ML KWPE: 100 | 30 days supply | Qty: 6 | Fill #0

## 2019-08-09 MED FILL — TRUEplus LANCETS 28G MISC: 25 days supply | Qty: 100 | Fill #0

## 2019-08-09 MED FILL — metFORMIN HCL 1000 MG TABS: 1000 | 30 days supply | Qty: 60 | Fill #0

## 2019-08-09 MED FILL — ?HYDROCHLOROTHIAZIDE 25MG T: 25 | 30 days supply | Qty: 30 | Fill #0

## 2019-08-09 MED FILL — !TRUE METRIX BLOOD GLUCOSE: 365 days supply | Qty: 1 | Fill #0

## 2019-08-09 MED FILL — ?GLIPIZIDE 10 MG TABLET: 10 | 30 days supply | Qty: 60 | Fill #0

## 2019-08-09 MED FILL — TRUE METRIX TEST STRIP: 25 days supply | Qty: 100 | Fill #0

## 2019-08-09 NOTE — Patient Instructions (Signed)
Managing Your Hypertension Hypertension is commonly called high blood pressure. This is when the force of your blood pressing against the walls of your arteries is too strong. Arteries are blood vessels that carry blood from your heart throughout your body. Hypertension forces the heart to work harder to pump blood, and may cause the arteries to become narrow or stiff. Having untreated or uncontrolled hypertension can cause heart attack, stroke, kidney disease, and other problems. What are blood pressure readings? A blood pressure reading consists of a higher number over a lower number. Ideally, your blood pressure should be below 120/80. The first ("top") number is called the systolic pressure. It is a measure of the pressure in your arteries as your heart beats. The second ("bottom") number is called the diastolic pressure. It is a measure of the pressure in your arteries as the heart relaxes. What does my blood pressure reading mean? Blood pressure is classified into four stages. Based on your blood pressure reading, your health care provider may use the following stages to determine what type of treatment you need, if any. Systolic pressure and diastolic pressure are measured in a unit called mm Hg. Normal  Systolic pressure: below 120.  Diastolic pressure: below 80. Elevated  Systolic pressure: 120-129.  Diastolic pressure: below 80. Hypertension stage 1  Systolic pressure: 130-139.  Diastolic pressure: 80-89. Hypertension stage 2  Systolic pressure: 140 or above.  Diastolic pressure: 90 or above. What health risks are associated with hypertension? Managing your hypertension is an important responsibility. Uncontrolled hypertension can lead to:  A heart attack.  A stroke.  A weakened blood vessel (aneurysm).  Heart failure.  Kidney damage.  Eye damage.  Metabolic syndrome.  Memory and concentration problems. What changes can I make to manage my  hypertension? Hypertension can be managed by making lifestyle changes and possibly by taking medicines. Your health care provider will help you make a plan to bring your blood pressure within a normal range. Eating and drinking   Eat a diet that is high in fiber and potassium, and low in salt (sodium), added sugar, and fat. An example eating plan is called the DASH (Dietary Approaches to Stop Hypertension) diet. To eat this way: ? Eat plenty of fresh fruits and vegetables. Try to fill half of your plate at each meal with fruits and vegetables. ? Eat whole grains, such as whole wheat pasta, brown rice, or whole grain bread. Fill about one quarter of your plate with whole grains. ? Eat low-fat diary products. ? Avoid fatty cuts of meat, processed or cured meats, and poultry with skin. Fill about one quarter of your plate with lean proteins such as fish, chicken without skin, beans, eggs, and tofu. ? Avoid premade and processed foods. These tend to be higher in sodium, added sugar, and fat.  Reduce your daily sodium intake. Most people with hypertension should eat less than 1,500 mg of sodium a day.  Limit alcohol intake to no more than 1 drink a day for nonpregnant women and 2 drinks a day for men. One drink equals 12 oz of beer, 5 oz of wine, or 1 oz of hard liquor. Lifestyle  Work with your health care provider to maintain a healthy body weight, or to lose weight. Ask what an ideal weight is for you.  Get at least 30 minutes of exercise that causes your heart to beat faster (aerobic exercise) most days of the week. Activities may include walking, swimming, or biking.  Include exercise   to strengthen your muscles (resistance exercise), such as weight lifting, as part of your weekly exercise routine. Try to do these types of exercises for 30 minutes at least 3 days a week.  Do not use any products that contain nicotine or tobacco, such as cigarettes and e-cigarettes. If you need help quitting,  ask your health care provider.  Control any long-term (chronic) conditions you have, such as high cholesterol or diabetes. Monitoring  Monitor your blood pressure at home as told by your health care provider. Your personal target blood pressure may vary depending on your medical conditions, your age, and other factors.  Have your blood pressure checked regularly, as often as told by your health care provider. Working with your health care provider  Review all the medicines you take with your health care provider because there may be side effects or interactions.  Talk with your health care provider about your diet, exercise habits, and other lifestyle factors that may be contributing to hypertension.  Visit your health care provider regularly. Your health care provider can help you create and adjust your plan for managing hypertension. Will I need medicine to control my blood pressure? Your health care provider may prescribe medicine if lifestyle changes are not enough to get your blood pressure under control, and if:  Your systolic blood pressure is 130 or higher.  Your diastolic blood pressure is 80 or higher. Take medicines only as told by your health care provider. Follow the directions carefully. Blood pressure medicines must be taken as prescribed. The medicine does not work as well when you skip doses. Skipping doses also puts you at risk for problems. Contact a health care provider if:  You think you are having a reaction to medicines you have taken.  You have repeated (recurrent) headaches.  You feel dizzy.  You have swelling in your ankles.  You have trouble with your vision. Get help right away if:  You develop a severe headache or confusion.  You have unusual weakness or numbness, or you feel faint.  You have severe pain in your chest or abdomen.  You vomit repeatedly.  You have trouble breathing. Summary  Hypertension is when the force of blood pumping  through your arteries is too strong. If this condition is not controlled, it may put you at risk for serious complications.  Your personal target blood pressure may vary depending on your medical conditions, your age, and other factors. For most people, a normal blood pressure is less than 120/80.  Hypertension is managed by lifestyle changes, medicines, or both. Lifestyle changes include weight loss, eating a healthy, low-sodium diet, exercising more, and limiting alcohol. This information is not intended to replace advice given to you by your health care provider. Make sure you discuss any questions you have with your health care provider. Document Released: 04/27/2012 Document Revised: 11/25/2018 Document Reviewed: 07/01/2016 [employer] Patient Education  2020 [employer]  

## 2019-08-09 NOTE — Progress Notes (Signed)
Established Patient Office Visit  Subjective:  Patient ID: Brandon Mcneil, male    DOB: February 21, 1981  Age: 38 y.o. MRN: [id number]  CC:  Chief Complaint  Patient presents with  . Hypertension  . Diabetes    HPI DIQUAN KASSIS presents for the management of hypertension Bp elevated 162/89 uncontrolled he denies  shortness of breath, headaches, chest pain or lower extremity edema and diabetes A1C 10.3 previously 10.4 uncontrolled.   Past Medical History:  Diagnosis Date  . Depression   . Diabetes ([place])   . Essential hypertension 02/03/2017  . Hypercholesteremia   . Hypertension     No past surgical history on file.  No family history on file.  Social History   Socioeconomic History  . Marital status: Single    Spouse name: Not on file  . Number of children: 3  . Years of education: 10th  . Highest education level: Not on file  Occupational History  . Occupation: unemployed  Tobacco Use  . Smoking status: Current Every Day Smoker    Packs/day: 0.50    Years: 14.00    Pack years: 7.00    Types: Cigarettes  . Smokeless tobacco: Never Used  . Tobacco comment: 1/2 pack a day  Substance and Sexual Activity  . Alcohol use: Yes    Alcohol/week: 2.0 standard drinks    Types: 2 Cans of beer per week    Comment: occasional  . Drug use: No    Types: Marijuana    Comment: MJ former user.  Last use in September 2017  . Sexual activity: Yes    Partners: Female    Birth control/protection: None  Other Topics Concern  . 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   Social Determinants of Health   Financial Resource Strain:   . Difficulty of Paying Living Expenses: Not on file  Food Insecurity:   . Worried About Charity fundraiser in the Last Year: Not on file  . Ran Out of Food in the Last Year: Not on file  Transportation Needs:   .  Lack of Transportation (Medical): Not on file  . Lack of Transportation (Non-Medical): Not on file  Physical Activity:   . Days of Exercise per Week: Not on file  . Minutes of Exercise per Session: Not on file  Stress:   . Feeling of Stress : Not on file  Social Connections:   . Frequency of Communication with Friends and Family: Not on file  . Frequency of Social Gatherings with Friends and Family: Not on file  . Attends Religious Services: Not on file  . Active Member of Clubs or Organizations: Not on file  . Attends Archivist Meetings: Not on file  . Marital Status: Not on file  Intimate Partner Violence:   . Fear of Current or Ex-Partner: Not on file  . Emotionally Abused: Not on file  . Physically Abused: Not on file  . Sexually Abused: Not on file    Outpatient Medications Prior to Visit  Medication Sig Dispense Refill  . aspirin 81 MG tablet Take 1 tablet (81 mg total) by mouth daily. 90 tablet 3  . Blood Glucose Monitoring Suppl (AGAMATRIX PRESTO) w/Device KIT Test glucose twice daiy 1 kit 0  . glucose blood (AGAMATRIX PRESTO TEST) test strip Twice daily  blood glucose testing 200 each 3  . glucose blood test strip Use as instructed 100 each 0  . hydrochlorothiazide (HYDRODIURIL) 25 MG tablet Take 1 tablet (25 mg total) by mouth daily. 30 tablet 0  . Insulin Glargine (LANTUS SOLOSTAR) 100 UNIT/ML Solostar Pen Inject 20 Units into the skin daily. 5 pen 3  . ibuprofen (ADVIL,MOTRIN) 200 MG tablet Take 200-400 mg by mouth every 6 (six) hours as needed for moderate pain.    . Melatonin 5 MG CAPS Take 1 capsule (5 mg total) by mouth at bedtime. (Patient not taking: Reported on 08/09/2019) 30 capsule 3  . amLODipine (NORVASC) 10 MG tablet Take 1 tablet (10 mg total) by mouth daily. 30 tablet 0  . atorvastatin (LIPITOR) 20 MG tablet Take 1 tablet (20 mg total) by mouth daily. (Patient not taking: Reported on 08/09/2019) 90 tablet 0  . cyclobenzaprine (FLEXERIL) 10 MG  tablet Take 1 tablet (10 mg total) by mouth 2 (two) times daily as needed for muscle spasms. (Patient not taking: Reported on 05/10/2019) 20 tablet 0  . doxycycline (VIBRAMYCIN) 100 MG capsule Take 1 capsule (100 mg total) by mouth 2 (two) times daily. 20 capsule 0  . glipiZIDE (GLUCOTROL) 10 MG tablet Take 1 tablet (10 mg total) by mouth 2 (two) times daily before a meal. 60 tablet 3  . metFORMIN (GLUCOPHAGE) 1000 MG tablet Take 1 tablet (1,000 mg total) by mouth 2 (two) times daily with a meal. (Patient not taking: Reported on 08/09/2019) 60 tablet 3  . naproxen (NAPROSYN) 375 MG tablet Take 1 tablet (375 mg total) by mouth 2 (two) times daily. (Patient not taking: Reported on 05/10/2019) 20 tablet 0  . nicotine (NICODERM CQ) 14 mg/24hr patch Place 1 patch (14 mg total) onto the skin daily. (Patient not taking: Reported on 05/10/2019) 28 patch 0  . nicotine (NICODERM CQ) 21 mg/24hr patch Place 1 patch (21 mg total) onto the skin daily. (Patient not taking: Reported on 05/10/2019) 28 patch 0  . nicotine (NICODERM CQ) 7 mg/24hr patch Place 1 patch (7 mg total) onto the skin daily. 28 patch 0   No facility-administered medications prior to visit.    Allergies  Allergen Reactions  . Lisinopril Swelling    Angioedema  . Penicillins Itching and Rash    ROS Review of Systems  Cardiovascular: Positive for chest pain.       When he smokes and drinks alcohol  All other systems reviewed and are negative.     Objective:    Physical Exam  Constitutional: He is oriented to person, place, and time. He appears well-developed and well-nourished.  Cardiovascular: Normal rate and regular rhythm.  Pulmonary/Chest: Effort normal and breath sounds normal.  Abdominal: Bowel sounds are normal.  Musculoskeletal:        General: Normal range of motion.     Cervical back: Neck supple.  Neurological: He is oriented to person, place, and time.  Skin: Skin is warm.  Psychiatric: He has a normal mood and  affect. His behavior is normal.    BP (!) 162/89 (BP Location: Left Arm, Patient Position: Sitting, Cuff Size: Normal)   Pulse 94   Temp (!) 97.3 F (36.3 C) (Temporal)   Ht _0  (1.727 m)   Wt 222 lb (100.7 kg)   SpO2 93%   BMI 33.75 kg/m  Wt Readings from Last 3 Encounters:  08/09/19 222 lb (100.7 kg)  05/10/19 217 lb 6.4 oz (98.6 kg)  02/05/19 211 lb (  95.7 kg)     Health Maintenance Due  Topic Date Due  . OPHTHALMOLOGY EXAM  12/09/1988    There are no preventive care reminders to display for this patient.  No results found for: TSH Lab Results  Component Value Date   WBC 5.7 05/10/2019   HGB 14.4 05/10/2019   HCT 43.7 05/10/2019   MCV 95 05/10/2019   PLT 246 05/10/2019   Lab Results  Component Value Date   NA 134 05/10/2019   K 4.2 05/10/2019   CO2 27 05/10/2019   GLUCOSE 322 (H) 05/10/2019   BUN 21 05/10/2019   CREATININE 1.05 05/10/2019   BILITOT 0.4 05/10/2019   ALKPHOS 88 05/10/2019   AST 16 05/10/2019   ALT 13 05/10/2019   PROT 7.3 05/10/2019   ALBUMIN 4.0 05/10/2019   CALCIUM 9.2 05/10/2019   ANIONGAP 9 02/05/2019   Lab Results  Component Value Date   CHOL 153 05/10/2019   Lab Results  Component Value Date   HDL 44 05/10/2019   Lab Results  Component Value Date   LDLCALC 92 05/10/2019   Lab Results  Component Value Date   TRIG 89 05/10/2019   Lab Results  Component Value Date   CHOLHDL 3.5 05/10/2019   Lab Results  Component Value Date   HGBA1C 10.3 (A) 08/09/2019      Assessment & Plan:  Verlie was seen today for hypertension and diabetes.  Diagnoses and all orders for this visit:  Type 2 diabetes mellitus with complication, without long-term current use of insulin (HCC) Uncontrolled discussed modifiable and non modifable risk factors AAM, obese, HTN modifiable weight loss, medication compliance, diet modification increase risk of complications vision, limb loss, kidney failure, microvascular disease stroke and heart  attack . Well needed conversation all questions were addressed after information above provided. -     HgB A1c -     Glucose (CBG) -     Discontinue: glucose blood (AGAMATRIX PRESTO TEST) test strip; Twice daily blood glucose testing -     Ambulatory referral to Ophthalmology  Essential hypertension, benign Risk for MI or CVA obese AAM noncompliant with medication and diet and exercise goal </= 130/80  DASH diet, medication compliance,Discussed medication compliance, adverse effects.  Tobacco use disorder Aware of increased risk for lung cancer and other respiratory diseases recommend cessation.  This will be reminded at each clinical visit.  Adult BMI 33.0-33.9 kg/sq m Obesity is 30-39 indicating an excess in caloric intake or underlining conditions. This may lead to other co-morbidities as mention above . Lifestyle modifications of diet and exercise may reduce obesity and improve health  Other orders/ Medication Refill -    -     metFORMIN (GLUCOPHAGE) 1000 MG tablet; Take 1 tablet (1,000 mg total) by mouth 2 (two) times daily with a meal. -     glipiZIDE (GLUCOTROL) 10 MG tablet; Take 1 tablet (10 mg total) by mouth 2 (two) times daily before a meal. -     amLODipine (NORVASC) 10 MG tablet; Take 1 tablet (10 mg total) by mouth daily. -     atorvastatin (LIPITOR) 20 MG tablet; Take 1 tablet (20 mg total) by mouth daily. -     Insulin Glargine (LANTUS SOLOSTAR) 100 UNIT/ML Solostar Pen; Inject 20 Units into the skin daily. -     hydrochlorothiazide (HYDRODIURIL) 25 MG tablet; Take 1 tablet (25 mg total) by mouth daily. -     losartan (COZAAR) 100 MG tablet; Take 0.5 tablets (50  mg total) by mouth daily.    Meds ordered this encounter  Medications  . DISCONTD: glucose blood (AGAMATRIX PRESTO TEST) test strip    Sig: Twice daily blood glucose testing    Dispense:  200 each    Refill:  3    Please let me know if this is the wrong test strip you cover or if you do not have at all.  Marland Kitchen  DISCONTD: metFORMIN (GLUCOPHAGE) 1000 MG tablet    Sig: Take 1 tablet (1,000 mg total) by mouth 2 (two) times daily with a meal.    Dispense:  60 tablet    Refill:  5  . DISCONTD: glipiZIDE (GLUCOTROL) 10 MG tablet    Sig: Take 1 tablet (10 mg total) by mouth 2 (two) times daily before a meal.    Dispense:  60 tablet    Refill:  5  . DISCONTD: amLODipine (NORVASC) 10 MG tablet    Sig: Take 1 tablet (10 mg total) by mouth daily.    Dispense:  30 tablet    Refill:  5  . DISCONTD: Insulin Glargine (LANTUS SOLOSTAR) 100 UNIT/ML Solostar Pen    Sig: Inject 20 Units into the skin daily.    Dispense:  5 pen    Refill:  5  . DISCONTD: hydrochlorothiazide (HYDRODIURIL) 25 MG tablet    Sig: Take 1 tablet (25 mg total) by mouth daily.    Dispense:  30 tablet    Refill:  5  . metFORMIN (GLUCOPHAGE) 1000 MG tablet    Sig: Take 1 tablet (1,000 mg total) by mouth 2 (two) times daily with a meal.    Dispense:  60 tablet    Refill:  5  . glipiZIDE (GLUCOTROL) 10 MG tablet    Sig: Take 1 tablet (10 mg total) by mouth 2 (two) times daily before a meal.    Dispense:  60 tablet    Refill:  5  . amLODipine (NORVASC) 10 MG tablet    Sig: Take 1 tablet (10 mg total) by mouth daily.    Dispense:  30 tablet    Refill:  5  . atorvastatin (LIPITOR) 20 MG tablet    Sig: Take 1 tablet (20 mg total) by mouth daily.    Dispense:  90 tablet    Refill:  0  . Insulin Glargine (LANTUS SOLOSTAR) 100 UNIT/ML Solostar Pen    Sig: Inject 20 Units into the skin daily.    Dispense:  5 pen    Refill:  5  . hydrochlorothiazide (HYDRODIURIL) 25 MG tablet    Sig: Take 1 tablet (25 mg total) by mouth daily.    Dispense:  30 tablet    Refill:  5  . losartan (COZAAR) 100 MG tablet    Sig: Take 0.5 tablets (50 mg total) by mouth daily.    Dispense:  30 tablet    Refill:  5    Follow-up: Return in about 3 months (around 11/07/2019) for on medication schedule after he takes medication consistanly .    Kerin Perna, NP

## 2019-08-13 ENCOUNTER — Encounter (INDEPENDENT_AMBULATORY_CARE_PROVIDER_SITE_OTHER): Payer: Self-pay | Admitting: Primary Care

## 2019-10-22 ENCOUNTER — Other Ambulatory Visit: Payer: Self-pay

## 2019-10-22 ENCOUNTER — Emergency Department (HOSPITAL_COMMUNITY): Payer: Self-pay

## 2019-10-22 ENCOUNTER — Emergency Department (HOSPITAL_COMMUNITY)
Admission: EM | Admit: 2019-10-22 | Discharge: 2019-10-23 | Disposition: A | Discharge status: Home / Self Care | Payer: Self-pay | Attending: Emergency Medicine | Admitting: Emergency Medicine

## 2019-10-22 ENCOUNTER — Encounter (HOSPITAL_COMMUNITY): Payer: Self-pay

## 2019-10-22 DIAGNOSIS — Z79899 Other long term (current) drug therapy: Secondary | ICD-10-CM | POA: Insufficient documentation

## 2019-10-22 DIAGNOSIS — F17210 Nicotine dependence, cigarettes, uncomplicated: Secondary | ICD-10-CM | POA: Insufficient documentation

## 2019-10-22 DIAGNOSIS — R1030 Lower abdominal pain, unspecified: Secondary | ICD-10-CM | POA: Insufficient documentation

## 2019-10-22 DIAGNOSIS — R300 Dysuria: Secondary | ICD-10-CM | POA: Insufficient documentation

## 2019-10-22 DIAGNOSIS — I10 Essential (primary) hypertension: Secondary | ICD-10-CM | POA: Insufficient documentation

## 2019-10-22 DIAGNOSIS — N3001 Acute cystitis with hematuria: Secondary | ICD-10-CM | POA: Insufficient documentation

## 2019-10-22 DIAGNOSIS — Z794 Long term (current) use of insulin: Secondary | ICD-10-CM | POA: Insufficient documentation

## 2019-10-22 DIAGNOSIS — E119 Type 2 diabetes mellitus without complications: Secondary | ICD-10-CM | POA: Insufficient documentation

## 2019-10-22 LAB — CBC WITH DIFFERENTIAL/PLATELET
Abs Immature Granulocytes: 0.01 10*3/uL (ref 0.00–0.07)
Basophils Absolute: 0 10*3/uL (ref 0.0–0.1)
Basophils Relative: 1 %
Eosinophils Absolute: 0 10*3/uL (ref 0.0–0.5)
Eosinophils Relative: 0 %
HCT: 40.6 % (ref 39.0–52.0)
Hemoglobin: 14.9 g/dL (ref 13.0–17.0)
Immature Granulocytes: 0 %
Lymphocytes Relative: 28 %
Lymphs Abs: 1.8 10*3/uL (ref 0.7–4.0)
MCH: 31 pg (ref 26.0–34.0)
MCHC: 36.7 g/dL — ABNORMAL HIGH (ref 30.0–36.0)
MCV: 84.4 fL (ref 80.0–100.0)
Monocytes Absolute: 0.5 10*3/uL (ref 0.1–1.0)
Monocytes Relative: 7 %
Neutro Abs: 4 10*3/uL (ref 1.7–7.7)
Neutrophils Relative %: 64 %
Platelets: 242 10*3/uL (ref 150–400)
RBC: 4.81 MIL/uL (ref 4.22–5.81)
RDW: 13 % (ref 11.5–15.5)
WBC: 6.3 10*3/uL (ref 4.0–10.5)
nRBC: 0 % (ref 0.0–0.2)

## 2019-10-22 LAB — URINALYSIS, ROUTINE W REFLEX MICROSCOPIC
Bilirubin Urine: NEGATIVE
Glucose, UA: 150 mg/dL — AB
Ketones, ur: NEGATIVE mg/dL
Leukocytes,Ua: NEGATIVE
Nitrite: NEGATIVE
Protein, ur: NEGATIVE mg/dL
RBC / HPF: >50 RBC/hpf — ABNORMAL HIGH (ref 0–5)
Specific Gravity, Urine: 1.008 (ref 1.005–1.030)
WBC, UA: >50 WBC/hpf — ABNORMAL HIGH (ref 0–5)
pH: 6 (ref 5.0–8.0)

## 2019-10-22 LAB — COMPREHENSIVE METABOLIC PANEL
ALT: 23 U/L (ref 0–44)
AST: 25 U/L (ref 15–41)
Albumin: 4.5 g/dL (ref 3.5–5.0)
Alkaline Phosphatase: 60 U/L (ref 38–126)
Anion gap: 10 (ref 5–15)
BUN: 15 mg/dL (ref 6–20)
CO2: 27 mmol/L (ref 22–32)
Calcium: 9.2 mg/dL (ref 8.9–10.3)
Chloride: 102 mmol/L (ref 98–111)
Creatinine, Ser: 0.87 mg/dL (ref 0.61–1.24)
GFR calc Af Amer: >60 mL/min (ref >60)
GFR calc non Af Amer: >60 mL/min (ref >60)
Glucose, Bld: 205 mg/dL — ABNORMAL HIGH (ref 70–99)
Potassium: 3.5 mmol/L (ref 3.5–5.1)
Sodium: 139 mmol/L (ref 135–145)
Total Bilirubin: 0.5 mg/dL (ref 0.3–1.2)
Total Protein: 8.6 g/dL — ABNORMAL HIGH (ref 6.5–8.1)

## 2019-10-22 LAB — PROTIME-INR
INR: 1 (ref 0.8–1.2)
Prothrombin Time: 13.4 seconds (ref 11.4–15.2)

## 2019-10-22 MED ORDER — CIPROFLOXACIN HCL 500 MG PO TABS: 500.0000 mg | ORAL_TABLET | Freq: Once | ORAL | Status: DC

## 2019-10-22 MED ORDER — CEPHALEXIN 500 MG PO CAPS
500.0000 mg | ORAL_CAPSULE | Freq: Once | ORAL | Status: AC
  Administered 2019-10-22: 500 mg via ORAL
  Filled 2019-10-22: qty 1

## 2019-10-22 MED ORDER — NAPROXEN 500 MG PO TABS
500.0000 mg | ORAL_TABLET | Freq: Once | ORAL | Status: AC
  Administered 2019-10-22: 500 mg via ORAL
  Filled 2019-10-22: qty 1

## 2019-10-22 MED ORDER — CEPHALEXIN 500 MG PO CAPS: 500.0000 mg | ORAL_CAPSULE | Freq: Two times a day (BID) | ORAL | 0 refills | Status: AC | PRN

## 2019-10-22 MED ORDER — SODIUM CHLORIDE (PF) 0.9 % IJ SOLN
INTRAMUSCULAR | Status: AC
  Filled 2019-10-22: qty 50

## 2019-10-22 MED ORDER — IOHEXOL 300 MG/ML  SOLN
100.0000 mL | Freq: Once | INTRAMUSCULAR | Status: AC | PRN
  Administered 2019-10-22: 100 mL via INTRAVENOUS

## 2019-10-22 NOTE — ED Notes (Signed)
Pt provided sanitary napkin to place in underwear as pt states " I am leaking down there"

## 2019-10-22 NOTE — ED Provider Notes (Signed)
Brandon Mcneil DEPT Provider Note   CSN: [id number] Arrival date & time: 10/22/19  2016     History Chief Complaint  Patient presents with  . Hematuria    Brandon Mcneil is a 39 y.o. male.  HPI     39 year old male comes in a chief complaint of bloody urine and pain with urination. He has history of diabetes, hypertension, hyperlipidemia.  He smokes 1 to 2 packs a day of cigarettes for the last 20 years and also admits to heavy alcohol consumption for the last 10 years.  Patient reports that today he started having bloody urine.  He has had 3 episodes of gross hematuria, along with urinary hesitancy and pain with urination.  He has no history of kidney stones.  He denies any prior history of UTI or bloody urine.  Patient also denies any trauma.  Past Medical History:  Diagnosis Date  . Depression   . Diabetes ([place])   . Essential hypertension 02/03/2017  . Hypercholesteremia   . Hypertension     Patient Active Problem List   Diagnosis Date Noted  . Stress 02/08/2017  . Essential hypertension 02/03/2017  . Type 2 diabetes mellitus with complication, without long-term current use of insulin ([place]) 02/03/2017  . Medication overdose 10/31/2013  . Bipolar I disorder, most recent episode depressed ([place]) 09/11/2013  . Tobacco use disorder 09/11/2013  . Newly diagnosed diabetes ([place]) 09/11/2013  . Dehydration 09/11/2013  . Pain, dental 09/11/2013  . DKA (diabetic ketoacidoses) ([place]) 09/10/2013    History reviewed. No pertinent surgical history.     No family history on file.  Social History   Tobacco Use  . Smoking status: Current Every Day Smoker    Packs/day: 0.50    Years: 14.00    Pack years: 7.00    Types: Cigarettes  . Smokeless tobacco: Never Used  . Tobacco comment: 1/2 pack a day  Substance Use Topics  . Alcohol use: Yes    Alcohol/week: 2.0 standard drinks    Types: 2 Cans of beer per week    Comment: occasional  . Drug  use: No    Types: Marijuana    Comment: MJ former user.  Last use in September 2017    Home Medications Prior to Admission medications   Medication Sig Start Date End Date Taking? Authorizing Provider  amLODipine (NORVASC) 10 MG tablet Take 10 mg by mouth daily.   Yes [provider]  aspirin 81 MG tablet Take 1 tablet (81 mg total) by mouth daily. 02/03/17  Yes Mack Hook, MD  atorvastatin (LIPITOR) 20 MG tablet Take 1 tablet (20 mg total) by mouth daily. 08/09/19  Yes Kerin Perna, NP  glipiZIDE (GLUCOTROL) 10 MG tablet Take 10 mg by mouth 2 (two) times daily before a meal.   Yes [provider]  hydrochlorothiazide (HYDRODIURIL) 25 MG tablet Take 1 tablet (25 mg total) by mouth daily. Patient taking differently: Take 12.5 mg by mouth daily.  08/09/19  Yes Kerin Perna, NP  Insulin Glargine (LANTUS SOLOSTAR) 100 UNIT/ML Solostar Pen Inject 20 Units into the skin daily. 08/09/19  Yes Kerin Perna, NP  losartan (COZAAR) 100 MG tablet Take 0.5 tablets (50 mg total) by mouth daily. 08/09/19  Yes Kerin Perna, NP  amLODipine (NORVASC) 10 MG tablet Take 1 tablet (10 mg total) by mouth daily. 08/09/19 09/08/19  Kerin Perna, NP  Blood Glucose Monitoring Suppl (TRUE METRIX METER) DEVI 1 kit by Does  not apply route daily. Use as instructed to check blood sugar daily. 08/09/19   Charlott Rakes, MD  glipiZIDE (GLUCOTROL) 10 MG tablet Take 1 tablet (10 mg total) by mouth 2 (two) times daily before a meal. 08/09/19 09/08/19  Kerin Perna, NP  glucose blood (TRUE METRIX BLOOD GLUCOSE TEST) test strip Use as instructed to check blood sugar daily. 08/09/19   Charlott Rakes, MD  Melatonin 5 MG CAPS Take 1 capsule (5 mg total) by mouth at bedtime. Patient not taking: Reported on 08/09/2019 05/10/19   Kerin Perna, NP  metFORMIN (GLUCOPHAGE) 1000 MG tablet Take 1 tablet (1,000 mg total) by mouth 2 (two) times daily with a meal. 08/09/19    Kerin Perna, NP  TRUEplus Lancets 28G MISC Use as instructed to check blood sugar daily. 08/09/19   Charlott Rakes, MD  losartan-hydrochlorothiazide (HYZAAR) 100-25 MG tablet Take 1 tablet by mouth daily. 05/10/19 07/07/19  Kerin Perna, NP    Allergies    Lisinopril and Penicillins  Review of Systems   Review of Systems  Constitutional: Positive for activity change.  Respiratory: Negative for stridor.   Cardiovascular: Negative for chest pain.  Gastrointestinal: Negative for nausea and vomiting.  Genitourinary: Positive for dysuria and hematuria.  Allergic/Immunologic: Negative for immunocompromised state.  Hematological: Does not bruise/bleed easily.  All other systems reviewed and are negative.   Physical Exam Updated Vital Signs BP 126/78   Pulse 89   Temp 98.2 F (36.8 C) (Oral)   Resp 18   SpO2 100%   Physical Exam Vitals and nursing note reviewed.  Constitutional:      Appearance: He is well-developed.  HENT:     Head: Atraumatic.  Cardiovascular:     Rate and Rhythm: Normal rate.     Pulses: Normal pulses.     Comments: 2+ and equal radial pulse bilaterally.  2+ and equal dorsalis pedis and femoral pulses bilaterally Pulmonary:     Effort: Pulmonary effort is normal.  Abdominal:     Tenderness: There is abdominal tenderness. There is no guarding or rebound.     Comments: Mild tenderness in the lower abdominal quadrants  Musculoskeletal:     Cervical back: Neck supple.  Skin:    General: Skin is warm.  Neurological:     Mental Status: He is alert and oriented to person, place, and time.     ED Results / Procedures / Treatments   Labs (all labs ordered are listed, but only abnormal results are displayed) Labs Reviewed  URINALYSIS, ROUTINE W REFLEX MICROSCOPIC - Abnormal; Notable for the following components:      Result Value   Glucose, UA 150 (*)    Hgb urine dipstick LARGE (*)    RBC / HPF >50 (*)    WBC, UA >50 (*)    Bacteria, UA  RARE (*)    All other components within normal limits  COMPREHENSIVE METABOLIC PANEL - Abnormal; Notable for the following components:   Glucose, Bld 205 (*)    Total Protein 8.6 (*)    All other components within normal limits  CBC WITH DIFFERENTIAL/PLATELET - Abnormal; Notable for the following components:   MCHC 36.7 (*)    All other components within normal limits  URINE CULTURE  PROTIME-INR    EKG None  Radiology No results found.  Procedures Procedures (including critical care time)  Medications Ordered in ED Medications - No data to display  ED Course  I have reviewed the triage  vital signs and the nursing notes.  Pertinent labs & imaging results that were available during my care of the patient were reviewed by me and considered in my medical decision making (see chart for details).    MDM Rules/Calculators/A&P                      39 year old comes in a chief complaint of bloody urine. He has history of hypertension, hyperlipidemia, around 30-pack-year smoking history and alcoholism.  Along with the gross hematuria he is having dysuria as well.  Differential diagnosis includes cystitis, kidney stones, tumor. UA is positive for RBCs and WBCs, there is only rare bacteria.  We will proceed with CT abdomen and pelvis to ensure there is no underlying tumor mass that we can appreciate.  If the CT scan is negative then patient will be discharged with antibiotics and PCP follow-up. Incoming team to follow-up on the CT scan.  Final Clinical Impression(s) / ED Diagnoses Final diagnoses:  None    Rx / DC Orders ED Discharge Orders    None       Varney Biles, MD 10/22/19 2218

## 2019-10-22 NOTE — Discharge Instructions (Addendum)
Your CT scan today was reassuring, but your urine did reveal findings consistent with infection.  Take Keflex as prescribed until finished.  We recommend follow-up with your primary care doctor.  You may return to the ED for new or concerning symptoms.

## 2019-10-22 NOTE — ED Notes (Signed)
Pt ambulatory to RR independently  

## 2019-10-22 NOTE — ED Notes (Signed)
Pt transported to CT ?

## 2019-10-22 NOTE — ED Triage Notes (Signed)
Patient arrived via ems stating that he woke up at 1pm with hematuria. Reports burning during urination. Declines any blood thinners.  Patient made EMS aware he is a diabetic and did not take his medication tonight.

## 2019-10-23 NOTE — ED Provider Notes (Signed)
11:35 PM Patient care assumed from MD Nanavati at change of shift.  In short, patient is a 39 year old diabetic with a history of dyslipidemia and hypertension who presents to the ED for complaints of hematuria with urinary hesitancy and dysuria.  Pending CT scan at shift change.  CT reviewed and is negative for acute process in the abdomen/pelvis to explain patient's hematuria.  No kidney stones, renal masses, or abscess.  His history, however, is consistent with cystitis.  Therefore, believe hematuria to be secondary to hemorrhagic cystitis.  The patient will be started on Keflex for management.  His urine has been sent for culture.  Encouraged follow-up with a primary doctor.  Return precautions discussed and provided. Patient discharged in stable condition with no unaddressed concerns.  Results for orders placed or performed during the hospital encounter of 10/22/19  Urinalysis, Routine w reflex microscopic- may I&O cath if menses  Result Value Ref Range   Color, Urine YELLOW YELLOW   APPearance CLEAR CLEAR   Specific Gravity, Urine 1.008 1.005 - 1.030   pH 6.0 5.0 - 8.0   Glucose, UA 150 (A) NEGATIVE mg/dL   Hgb urine dipstick LARGE (A) NEGATIVE   Bilirubin Urine NEGATIVE NEGATIVE   Ketones, ur NEGATIVE NEGATIVE mg/dL   Protein, ur NEGATIVE NEGATIVE mg/dL   Nitrite NEGATIVE NEGATIVE   Leukocytes,Ua NEGATIVE NEGATIVE   RBC / HPF >50 (H) 0 - 5 RBC/hpf   WBC, UA >50 (H) 0 - 5 WBC/hpf   Bacteria, UA RARE (A) NONE SEEN  Comprehensive metabolic panel  Result Value Ref Range   Sodium 139 135 - 145 mmol/L   Potassium 3.5 3.5 - 5.1 mmol/L   Chloride 102 98 - 111 mmol/L   CO2 27 22 - 32 mmol/L   Glucose, Bld 205 (H) 70 - 99 mg/dL   BUN 15 6 - 20 mg/dL   Creatinine, Ser 7.40 0.61 - 1.24 mg/dL   Calcium 9.2 8.9 - 81.4 mg/dL   Total Protein 8.6 (H) 6.5 - 8.1 g/dL   Albumin 4.5 3.5 - 5.0 g/dL   AST 25 15 - 41 U/L   ALT 23 0 - 44 U/L   Alkaline Phosphatase 60 38 - 126 U/L   Total Bilirubin  0.5 0.3 - 1.2 mg/dL   GFR calc non Af Amer >60 >60 mL/min   GFR calc Af Amer >60 >60 mL/min   Anion gap 10 5 - 15  CBC with Differential  Result Value Ref Range   WBC 6.3 4.0 - 10.5 K/uL   RBC 4.81 4.22 - 5.81 MIL/uL   Hemoglobin 14.9 13.0 - 17.0 g/dL   HCT 48.1 85.6 - 31.4 %   MCV 84.4 80.0 - 100.0 fL   MCH 31.0 26.0 - 34.0 pg   MCHC 36.7 (H) 30.0 - 36.0 g/dL   RDW 97.0 26.3 - 78.5 %   Platelets 242 150 - 400 K/uL   nRBC 0.0 0.0 - 0.2 %   Neutrophils Relative % 64 %   Neutro Abs 4.0 1.7 - 7.7 K/uL   Lymphocytes Relative 28 %   Lymphs Abs 1.8 0.7 - 4.0 K/uL   Monocytes Relative 7 %   Monocytes Absolute 0.5 0.1 - 1.0 K/uL   Eosinophils Relative 0 %   Eosinophils Absolute 0.0 0.0 - 0.5 K/uL   Basophils Relative 1 %   Basophils Absolute 0.0 0.0 - 0.1 K/uL   Immature Granulocytes 0 %   Abs Immature Granulocytes 0.01 0.00 - 0.07 K/uL  Protime-INR  Result Value Ref Range   Prothrombin Time 13.4 11.4 - 15.2 seconds   INR 1.0 0.8 - 1.2   CT ABDOMEN PELVIS W CONTRAST  Result Date: 10/22/2019 CLINICAL DATA:  Hematuria with an unknown cause. EXAM: CT ABDOMEN AND PELVIS WITH CONTRAST TECHNIQUE: Multidetector CT imaging of the abdomen and pelvis was performed using the standard protocol following bolus administration of intravenous contrast. CONTRAST:  179mL OMNIPAQUE IOHEXOL 300 MG/ML  SOLN COMPARISON:  None. FINDINGS: Lower chest: The lung bases are clear. The heart size is normal. Hepatobiliary: There is decreased hepatic attenuation suggestive of hepatic steatosis. There may be gallstones versus and adherent cholesterol polyp within the gallbladder lumen.There is no biliary ductal dilation. Pancreas: Normal contours without ductal dilatation. No peripancreatic fluid collection. Spleen: No splenic laceration or hematoma. Adrenals/Urinary Tract: --Adrenal glands: No adrenal hemorrhage. --Right kidney/ureter: No hydronephrosis or perinephric hematoma. --Left kidney/ureter: No hydronephrosis or  perinephric hematoma. --Urinary bladder: Unremarkable. Stomach/Bowel: --Stomach/Duodenum: No hiatal hernia or other gastric abnormality. Normal duodenal course and caliber. --Small bowel: No dilatation or inflammation. --Colon: No focal abnormality. --Appendix: Normal. Vascular/Lymphatic: Normal course and caliber of the major abdominal vessels. --No retroperitoneal lymphadenopathy. --No mesenteric lymphadenopathy. --No pelvic or inguinal lymphadenopathy. Reproductive: Unremarkable Other: There is a 1.8 cm fluid collection in the right gluteal cleft which may represent a pilonidal cyst or abscess. There is some skin thickening along the patient's low anterior left abdominal wall. Musculoskeletal. No acute displaced fractures. IMPRESSION: 1. No clear cause of the patient's hematuria. Specifically, there are no radiopaque kidney stones. There is no hydronephrosis. 2. A 1.8 cm fluid collection in the right gluteal cleft which may represent a pilonidal cyst or abscess. Correlation with physical exam is recommended. 3. Hepatic steatosis. 4. Possible gallstones versus polyp within the gallbladder lumen. Further evaluation with a nonemergent outpatient right upper quadrant ultrasound is recommended. Electronically Signed   By: Constance Holster M.D.   On: 10/22/2019 23:23      Antonietta Breach, PA-C 10/23/19 0046    Orpah Greek, MD 10/23/19 [phone]

## 2019-10-24 LAB — URINE CULTURE: Culture: NO GROWTH

## 2019-11-04 ENCOUNTER — Encounter (HOSPITAL_COMMUNITY): Payer: Self-pay | Admitting: *Deleted

## 2019-11-04 ENCOUNTER — Other Ambulatory Visit: Payer: Self-pay

## 2019-11-04 ENCOUNTER — Emergency Department (HOSPITAL_COMMUNITY)
Admission: EM | Admit: 2019-11-04 | Discharge: 2019-11-04 | Disposition: A | Discharge status: Home / Self Care | Payer: Self-pay | Attending: Emergency Medicine | Admitting: Emergency Medicine

## 2019-11-04 DIAGNOSIS — I10 Essential (primary) hypertension: Secondary | ICD-10-CM | POA: Insufficient documentation

## 2019-11-04 DIAGNOSIS — E876 Hypokalemia: Secondary | ICD-10-CM

## 2019-11-04 DIAGNOSIS — E119 Type 2 diabetes mellitus without complications: Secondary | ICD-10-CM | POA: Insufficient documentation

## 2019-11-04 DIAGNOSIS — Z794 Long term (current) use of insulin: Secondary | ICD-10-CM | POA: Insufficient documentation

## 2019-11-04 DIAGNOSIS — F17210 Nicotine dependence, cigarettes, uncomplicated: Secondary | ICD-10-CM | POA: Insufficient documentation

## 2019-11-04 DIAGNOSIS — L0231 Cutaneous abscess of buttock: Secondary | ICD-10-CM

## 2019-11-04 DIAGNOSIS — Z79899 Other long term (current) drug therapy: Secondary | ICD-10-CM | POA: Insufficient documentation

## 2019-11-04 DIAGNOSIS — Z7982 Long term (current) use of aspirin: Secondary | ICD-10-CM | POA: Insufficient documentation

## 2019-11-04 LAB — COMPREHENSIVE METABOLIC PANEL
ALT: 22 U/L (ref 0–44)
AST: 17 U/L (ref 15–41)
Albumin: 3.8 g/dL (ref 3.5–5.0)
Alkaline Phosphatase: 68 U/L (ref 38–126)
Anion gap: 11 (ref 5–15)
BUN: 15 mg/dL (ref 6–20)
CO2: 25 mmol/L (ref 22–32)
Calcium: 9.3 mg/dL (ref 8.9–10.3)
Chloride: 100 mmol/L (ref 98–111)
Creatinine, Ser: 1.04 mg/dL (ref 0.61–1.24)
GFR calc Af Amer: >60 mL/min (ref >60)
GFR calc non Af Amer: >60 mL/min (ref >60)
Glucose, Bld: 316 mg/dL — ABNORMAL HIGH (ref 70–99)
Potassium: 3 mmol/L — ABNORMAL LOW (ref 3.5–5.1)
Sodium: 136 mmol/L (ref 135–145)
Total Bilirubin: 0.7 mg/dL (ref 0.3–1.2)
Total Protein: 7.5 g/dL (ref 6.5–8.1)

## 2019-11-04 LAB — URINALYSIS, ROUTINE W REFLEX MICROSCOPIC
Bacteria, UA: NONE SEEN
Bilirubin Urine: NEGATIVE
Glucose, UA: >=500 mg/dL — AB
Hgb urine dipstick: NEGATIVE
Ketones, ur: NEGATIVE mg/dL
Leukocytes,Ua: NEGATIVE
Nitrite: NEGATIVE
Protein, ur: NEGATIVE mg/dL
Specific Gravity, Urine: 1.018 (ref 1.005–1.030)
pH: 6 (ref 5.0–8.0)

## 2019-11-04 LAB — CBC WITH DIFFERENTIAL/PLATELET
Abs Immature Granulocytes: 0.04 10*3/uL (ref 0.00–0.07)
Basophils Absolute: 0 10*3/uL (ref 0.0–0.1)
Basophils Relative: 0 %
Eosinophils Absolute: 0.1 10*3/uL (ref 0.0–0.5)
Eosinophils Relative: 1 %
HCT: 37.4 % — ABNORMAL LOW (ref 39.0–52.0)
Hemoglobin: 14 g/dL (ref 13.0–17.0)
Immature Granulocytes: 0 %
Lymphocytes Relative: 15 %
Lymphs Abs: 1.6 10*3/uL (ref 0.7–4.0)
MCH: 31.9 pg (ref 26.0–34.0)
MCHC: 37.4 g/dL — ABNORMAL HIGH (ref 30.0–36.0)
MCV: 85.2 fL (ref 80.0–100.0)
Monocytes Absolute: 1.1 10*3/uL — ABNORMAL HIGH (ref 0.1–1.0)
Monocytes Relative: 11 %
Neutro Abs: 7.7 10*3/uL (ref 1.7–7.7)
Neutrophils Relative %: 73 %
Platelets: 307 10*3/uL (ref 150–400)
RBC: 4.39 MIL/uL (ref 4.22–5.81)
RDW: 12.7 % (ref 11.5–15.5)
WBC: 10.6 10*3/uL — ABNORMAL HIGH (ref 4.0–10.5)
nRBC: 0 % (ref 0.0–0.2)

## 2019-11-04 LAB — CBG MONITORING, ED: Glucose-Capillary: 338 mg/dL — ABNORMAL HIGH (ref 70–99)

## 2019-11-04 MED ORDER — METFORMIN HCL 1000 MG PO TABS: 1000.0000 mg | ORAL_TABLET | Freq: Two times a day (BID) | ORAL | 1 refills | Status: DC

## 2019-11-04 MED ORDER — KETOROLAC TROMETHAMINE 30 MG/ML IJ SOLN
30.0000 mg | Freq: Once | INTRAMUSCULAR | Status: AC
  Administered 2019-11-04: 30 mg via INTRAMUSCULAR
  Filled 2019-11-04: qty 1

## 2019-11-04 MED ORDER — POTASSIUM CHLORIDE CRYS ER 20 MEQ PO TBCR
40.0000 meq | EXTENDED_RELEASE_TABLET | Freq: Once | ORAL | Status: AC
  Administered 2019-11-04: 06:00:00 40 meq via ORAL
  Filled 2019-11-04: qty 2

## 2019-11-04 MED ORDER — SULFAMETHOXAZOLE-TRIMETHOPRIM 800-160 MG PO TABS: 1.0000 | ORAL_TABLET | Freq: Two times a day (BID) | ORAL | 0 refills | Status: AC

## 2019-11-04 MED ORDER — LIDOCAINE-EPINEPHRINE (PF) 2 %-1:200000 IJ SOLN
10.0000 mL | Freq: Once | INTRAMUSCULAR | Status: AC
  Administered 2019-11-04: 10 mL via INTRADERMAL
  Filled 2019-11-04: qty 20

## 2019-11-04 MED ORDER — GLIPIZIDE 10 MG PO TABS: 10.0000 mg | ORAL_TABLET | Freq: Two times a day (BID) | ORAL | 1 refills | Status: DC

## 2019-11-04 NOTE — ED Provider Notes (Signed)
[facility] EMERGENCY DEPARTMENT Provider Note   CSN: [id number] Arrival date & time: 11/04/19  0055     History Chief Complaint  Patient presents with  . Abscess    Brandon Mcneil is a 39 y.o. male.  39 year old male with a history of diabetes, hypertension, dyslipidemia, depression presents to the emergency department for complaints of an abscess to his left buttock x2 days.  Denies any drainage from the area.  Reports that the abscess has been getting larger.  He has had an abscess in this same location before.  Reports recent noncompliance with his diabetic medications as he ran out of his prescriptions for Metformin and glipizide.  He continues to take his NovoLog twice a day.  Sugars usually run in the 200s at baseline.  He has not had any fevers, nausea, vomiting, pain with defecation, stool changes.  The history is provided by the patient. No language interpreter was used.  Abscess      Past Medical History:  Diagnosis Date  . Depression   . Diabetes ([place])   . Essential hypertension 02/03/2017  . Hypercholesteremia   . Hypertension     Patient Active Problem List   Diagnosis Date Noted  . Stress 02/08/2017  . Essential hypertension 02/03/2017  . Type 2 diabetes mellitus with complication, without long-term current use of insulin ([place]) 02/03/2017  . Medication overdose 10/31/2013  . Bipolar I disorder, most recent episode depressed ([place]) 09/11/2013  . Tobacco use disorder 09/11/2013  . Newly diagnosed diabetes ([place]) 09/11/2013  . Dehydration 09/11/2013  . Pain, dental 09/11/2013  . DKA (diabetic ketoacidoses) ([place]) 09/10/2013    History reviewed. No pertinent surgical history.     No family history on file.  Social History   Tobacco Use  . Smoking status: Current Every Day Smoker    Packs/day: 0.50    Years: 14.00    Pack years: 7.00    Types: Cigarettes  . Smokeless tobacco: Never Used  . Tobacco comment: 1/2 pack a day    Substance Use Topics  . Alcohol use: Yes    Alcohol/week: 2.0 standard drinks    Types: 2 Cans of beer per week    Comment: occasional  . Drug use: No    Types: Marijuana    Comment: MJ former user.  Last use in September 2017    Home Medications Prior to Admission medications   Medication Sig Start Date End Date Taking? Authorizing Provider  amLODipine (NORVASC) 10 MG tablet Take 10 mg by mouth daily.   Yes [provider]  aspirin 81 MG tablet Take 1 tablet (81 mg total) by mouth daily. 02/03/17  Yes Mack Hook, MD  atorvastatin (LIPITOR) 20 MG tablet Take 1 tablet (20 mg total) by mouth daily. 08/09/19  Yes Kerin Perna, NP  hydrochlorothiazide (HYDRODIURIL) 25 MG tablet Take 1 tablet (25 mg total) by mouth daily. 08/09/19  Yes Kerin Perna, NP  ibuprofen (ADVIL) 200 MG tablet Take 200 mg by mouth every 6 (six) hours as needed for mild pain.   Yes [provider]  insulin aspart (NOVOLOG) 100 UNIT/ML injection Inject 6-10 Units into the skin 2 (two) times daily after a meal. Sliding scale   Yes [provider]  losartan (COZAAR) 100 MG tablet Take 0.5 tablets (50 mg total) by mouth daily. 08/09/19  Yes Kerin Perna, NP  Blood Glucose Monitoring Suppl (TRUE METRIX METER) DEVI 1 kit by Does not apply route daily. Use  as instructed to check blood sugar daily. 08/09/19   Charlott Rakes, MD  glipiZIDE (GLUCOTROL) 10 MG tablet Take 1 tablet (10 mg total) by mouth 2 (two) times daily before a meal. 11/04/19   Antonietta Breach, PA-C  glucose blood (TRUE METRIX BLOOD GLUCOSE TEST) test strip Use as instructed to check blood sugar daily. 08/09/19   Charlott Rakes, MD  Insulin Glargine (LANTUS SOLOSTAR) 100 UNIT/ML Solostar Pen Inject 20 Units into the skin daily. Patient not taking: Reported on 11/04/2019 08/09/19   Kerin Perna, NP  Melatonin 5 MG CAPS Take 1 capsule (5 mg total) by mouth at bedtime. Patient not taking: Reported on  08/09/2019 05/10/19   Kerin Perna, NP  metFORMIN (GLUCOPHAGE) 1000 MG tablet Take 1 tablet (1,000 mg total) by mouth 2 (two) times daily with a meal. 11/04/19   Antonietta Breach, PA-C  sulfamethoxazole-trimethoprim (BACTRIM DS) 800-160 MG tablet Take 1 tablet by mouth 2 (two) times daily for 7 days. 11/04/19 11/11/19  Antonietta Breach, PA-C  TRUEplus Lancets 28G MISC Use as instructed to check blood sugar daily. 08/09/19   Charlott Rakes, MD  losartan-hydrochlorothiazide (HYZAAR) 100-25 MG tablet Take 1 tablet by mouth daily. 05/10/19 07/07/19  Kerin Perna, NP    Allergies    Lisinopril and Penicillins  Review of Systems   Review of Systems  Ten systems reviewed and are negative for acute change, except as noted in the HPI.    Physical Exam Updated Vital Signs BP 139/71 (BP Location: Left Arm)   Pulse 95   Temp 98.6 F (37 C) (Oral)   Resp 17   SpO2 100%   Physical Exam Vitals and nursing note reviewed.  Constitutional:      General: He is not in acute distress.    Appearance: He is well-developed. He is not diaphoretic.     Comments: Nontoxic appearing and in NAD  HENT:     Head: Normocephalic and atraumatic.  Eyes:     General: No scleral icterus.    Conjunctiva/sclera: Conjunctivae normal.  Pulmonary:     Effort: Pulmonary effort is normal. No respiratory distress.     Comments: Respirations even and unlabored Genitourinary:   Musculoskeletal:        General: Normal range of motion.     Cervical back: Normal range of motion.  Skin:    General: Skin is warm and dry.     Coloration: Skin is not pale.     Findings: No erythema or rash.  Neurological:     Mental Status: He is alert and oriented to person, place, and time.     Comments: Ambulatory with steady gait.  Psychiatric:        Behavior: Behavior normal.     ED Results / Procedures / Treatments   Labs (all labs ordered are listed, but only abnormal results are displayed) Labs Reviewed    COMPREHENSIVE METABOLIC PANEL - Abnormal; Notable for the following components:      Result Value   Potassium 3.0 (*)    Glucose, Bld 316 (*)    All other components within normal limits  CBC WITH DIFFERENTIAL/PLATELET - Abnormal; Notable for the following components:   WBC 10.6 (*)    HCT 37.4 (*)    MCHC 37.4 (*)    Monocytes Absolute 1.1 (*)    All other components within normal limits  URINALYSIS, ROUTINE W REFLEX MICROSCOPIC - Abnormal; Notable for the following components:   Color, Urine STRAW (*)  Glucose, UA >=500 (*)    All other components within normal limits  CBG MONITORING, ED - Abnormal; Notable for the following components:   Glucose-Capillary 338 (*)    All other components within normal limits    EKG None  Radiology No results found.  Procedures Procedures (including critical care time)  INCISION AND DRAINAGE Performed by: Antonietta Breach Consent: Verbal consent obtained. Risks and benefits: risks, benefits and alternatives were discussed Type: abscess  Body area: L gluteal  Anesthesia: local infiltration  Incision was made with a scalpel.  Local anesthetic: lidocaine 2% with epinephrine  Anesthetic total: 5 ml  Complexity: complex Blunt dissection to break up loculations  Drainage: purulent  Drainage amount: copious, >10cc  Packing material: none  Patient tolerance: Patient tolerated the procedure well with no immediate complications.     Medications Ordered in ED Medications  lidocaine-EPINEPHrine (XYLOCAINE W/EPI) 2 %-1:200000 (PF) injection 10 mL (10 mLs Intradermal Given 11/04/19 0542)  ketorolac (TORADOL) 30 MG/ML injection 30 mg (30 mg Intramuscular Given 11/04/19 0541)  potassium chloride SA (KLOR-CON) CR tablet 40 mEq (40 mEq Oral Given 11/04/19 0541)    ED Course  I have reviewed the triage vital signs and the nursing notes.  Pertinent labs & imaging results that were available during my care of the patient were reviewed by  me and considered in my medical decision making (see chart for details).    MDM Rules/Calculators/A&P                      Patient with skin abscess amenable to incision and drainage.  Abscess was not large enough to warrant packing or drain. Wound recheck in 2 days. Encouraged home warm soaks and flushing.  Mild signs of cellulitis is surrounding skin.  Will d/c to home with Bactrim. Encouraged tight sugar control. Return precautions discussed and provided. Patient discharged in stable condition with no unaddressed concerns.   Final Clinical Impression(s) / ED Diagnoses Final diagnoses:  Abscess, gluteal, left  Hypokalemia    Rx / DC Orders ED Discharge Orders         Ordered    metFORMIN (GLUCOPHAGE) 1000 MG tablet  2 times daily with meals     11/04/19 0614    glipiZIDE (GLUCOTROL) 10 MG tablet  2 times daily before meals     11/04/19 0614    sulfamethoxazole-trimethoprim (BACTRIM DS) 800-160 MG tablet  2 times daily     11/04/19 0614           Antonietta Breach, PA-C 11/04/19 0618    Palumbo, April, MD 11/04/19 [phone]

## 2019-11-04 NOTE — Discharge Instructions (Signed)
Apply warm compresses or perform warm soaks 3-4 times per day to promote drainage.  We advised use of Bactrim as prescribed until finished.  Continue taking your diabetes medications.  Follow-up with your primary care doctor for wound recheck in 48 hours as well as for recheck of your sugar levels.  You may return to the ED for any new or concerning symptoms.

## 2019-11-04 NOTE — ED Triage Notes (Signed)
Pt reporting left buttocks abscess, hx of the same. Noticed about 2 days ago, not draining. Diabetic, says his sugars have been in the 300's for the past 3 days, denies fevers.

## 2019-11-30 ENCOUNTER — Encounter (INDEPENDENT_AMBULATORY_CARE_PROVIDER_SITE_OTHER): Payer: Self-pay | Admitting: Primary Care

## 2019-11-30 ENCOUNTER — Ambulatory Visit (INDEPENDENT_AMBULATORY_CARE_PROVIDER_SITE_OTHER): Payer: Self-pay | Admitting: Primary Care

## 2019-11-30 ENCOUNTER — Other Ambulatory Visit: Payer: Self-pay

## 2019-11-30 VITALS — BP 138/85 | HR 105 | Temp 97.1°F | Resp 16 | Ht 68.0 in | Wt 220.0 lb

## 2019-11-30 DIAGNOSIS — E119 Type 2 diabetes mellitus without complications: Secondary | ICD-10-CM

## 2019-11-30 DIAGNOSIS — I10 Essential (primary) hypertension: Secondary | ICD-10-CM

## 2019-11-30 DIAGNOSIS — E876 Hypokalemia: Secondary | ICD-10-CM

## 2019-11-30 DIAGNOSIS — E118 Type 2 diabetes mellitus with unspecified complications: Secondary | ICD-10-CM

## 2019-11-30 DIAGNOSIS — E782 Mixed hyperlipidemia: Secondary | ICD-10-CM

## 2019-11-30 DIAGNOSIS — F17200 Nicotine dependence, unspecified, uncomplicated: Secondary | ICD-10-CM

## 2019-11-30 DIAGNOSIS — Z760 Encounter for issue of repeat prescription: Secondary | ICD-10-CM

## 2019-11-30 LAB — POCT GLYCOSYLATED HEMOGLOBIN (HGB A1C): Hemoglobin A1C: 9.4 % — AB (ref 4.0–5.6)

## 2019-11-30 LAB — GLUCOSE, POCT (MANUAL RESULT ENTRY): POC Glucose: 229 mg/dl — AB (ref 70–99)

## 2019-11-30 MED ORDER — LANTUS SOLOSTAR 100 UNIT/ML ~~LOC~~ SOPN: 20.0000 [IU] | PEN_INJECTOR | Freq: Every day | SUBCUTANEOUS | 5 refills | Status: DC

## 2019-11-30 MED ORDER — ATORVASTATIN CALCIUM 20 MG PO TABS: 20.0000 mg | ORAL_TABLET | Freq: Every day | ORAL | 0 refills | Status: DC

## 2019-11-30 MED ORDER — POTASSIUM CHLORIDE CRYS ER 10 MEQ PO TBCR: 20.0000 meq | EXTENDED_RELEASE_TABLET | Freq: Every day | ORAL | 0 refills | Status: DC

## 2019-11-30 MED ORDER — METFORMIN HCL 1000 MG PO TABS: 1000.0000 mg | ORAL_TABLET | Freq: Two times a day (BID) | ORAL | 3 refills | Status: DC

## 2019-11-30 MED ORDER — GLIPIZIDE 10 MG PO TABS: 10.0000 mg | ORAL_TABLET | Freq: Two times a day (BID) | ORAL | 3 refills | Status: DC

## 2019-11-30 MED ORDER — HYDROCHLOROTHIAZIDE 25 MG PO TABS: 25.0000 mg | ORAL_TABLET | Freq: Every day | ORAL | 5 refills | Status: DC

## 2019-11-30 MED ORDER — INSULIN ASPART 100 UNIT/ML ~~LOC~~ SOLN: 6.0000 [IU] | Freq: Two times a day (BID) | SUBCUTANEOUS | 3 refills | Status: DC

## 2019-11-30 MED ORDER — AMLODIPINE BESYLATE 10 MG PO TABS: 10.0000 mg | ORAL_TABLET | Freq: Every day | ORAL | 1 refills | Status: DC

## 2019-11-30 MED ORDER — LOSARTAN POTASSIUM 100 MG PO TABS: 50.0000 mg | ORAL_TABLET | Freq: Every day | ORAL | 5 refills | Status: DC

## 2019-11-30 NOTE — Progress Notes (Signed)
New Patient Office Visit  Subjective:  Patient ID: Brandon Mcneil, male    DOB: 18-Feb-1981  Age: 39 y.o. MRN: [id number]  CC: No chief complaint on file.   HPI Brandon Mcneil presents for management of diabetes he does not grasp diabetes treatment if blood sure is 160 he takes or drinks something soda goes up to 300. Explain Do not use sugary candy or drinks to raised blood sugar unless less than 80.Blood pressure has improved . Denies shortness of breath,chest pain or lower extremity edema. Notes does have a headache after taking insulin on a empty stomach   Past Medical History:  Diagnosis Date  . Depression   . Diabetes ([place])   . Essential hypertension 02/03/2017  . Hypercholesteremia   . Hypertension     No past surgical history on file.  No family history on file.  Social History   Socioeconomic History  . Marital status: Single    Spouse name: Not on file  . Number of children: 3  . Years of education: 10th  . Highest education level: Not on file  Occupational History  . Occupation: unemployed  Tobacco Use  . Smoking status: Current Every Day Smoker    Packs/day: 0.50    Years: 14.00    Pack years: 7.00    Types: Cigarettes  . Smokeless tobacco: Never Used  . Tobacco comment: 1/2 pack a day  Substance and Sexual Activity  . Alcohol use: Yes    Alcohol/week: 2.0 standard drinks    Types: 2 Cans of beer per week    Comment: occasional  . Drug use: No    Types: Marijuana    Comment: MJ former user.  Last use in September 2017  . Sexual activity: Yes    Partners: Female    Birth control/protection: None  Other Topics Concern  . 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   Social Determinants of Health   Financial Resource Strain:   . Difficulty of Paying Living Expenses:   Food Insecurity:   . Worried About Ship broker in the Last Year:   . Arboriculturist in the Last Year:   Transportation Needs:   . Film/video editor (Medical):   Marland Kitchen Lack of Transportation (Non-Medical):   Physical Activity:   . Days of Exercise per Week:   . Minutes of Exercise per Session:   Stress:   . Feeling of Stress :   Social Connections:   . Frequency of Communication with Friends and Family:   . Frequency of Social Gatherings with Friends and Family:   . Attends Religious Services:   . Active Member of Clubs or Organizations:   . Attends Archivist Meetings:   Marland Kitchen Marital Status:   Intimate Partner Violence:   . Fear of Current or Ex-Partner:   . Emotionally Abused:   Marland Kitchen Physically Abused:   . Sexually Abused:     ROS Review of Systems  Neurological: Positive for headaches.       Due to blood sugars  All other systems reviewed and are negative.   Objective:   Today's Vitals: BP 138/85   Pulse (!) 105   Temp (!) 97.1 F (36.2 C)   Resp 16   Ht '5\' 8"'  (1.727 m)   Wt 220  lb (99.8 kg)   SpO2 97%   BMI 33.45 kg/m   Physical Exam Vitals reviewed.  Constitutional:      Appearance: He is obese.  HENT:     Head: Normocephalic.  Eyes:     Extraocular Movements: Extraocular movements intact.  Cardiovascular:     Rate and Rhythm: Normal rate and regular rhythm.  Pulmonary:     Effort: Pulmonary effort is normal.     Breath sounds: Normal breath sounds.  Abdominal:     General: Bowel sounds are normal.  Musculoskeletal:        General: Normal range of motion.     Cervical back: Normal range of motion and neck supple.  Skin:    General: Skin is warm.  Neurological:     Mental Status: He is alert and oriented to person, place, and time.  Psychiatric:        Mood and Affect: Mood normal.        Behavior: Behavior normal.        Thought Content: Thought content normal.        Judgment: Judgment normal.     Assessment & Plan:    Outpatient Encounter Medications as of 11/30/2019   Medication Sig  . amLODipine (NORVASC) 10 MG tablet Take 1 tablet (10 mg total) by mouth daily.  Marland Kitchen aspirin 81 MG tablet Take 1 tablet (81 mg total) by mouth daily.  Marland Kitchen atorvastatin (LIPITOR) 20 MG tablet Take 1 tablet (20 mg total) by mouth daily.  Marland Kitchen glipiZIDE (GLUCOTROL) 10 MG tablet Take 1 tablet (10 mg total) by mouth 2 (two) times daily before a meal.  . hydrochlorothiazide (HYDRODIURIL) 25 MG tablet Take 1 tablet (25 mg total) by mouth daily.  Marland Kitchen ibuprofen (ADVIL) 200 MG tablet Take 200 mg by mouth every 6 (six) hours as needed for mild pain.  Marland Kitchen insulin aspart (NOVOLOG) 100 UNIT/ML injection Inject 6-10 Units into the skin 2 (two) times daily after a meal. Sliding scale  . insulin glargine (LANTUS SOLOSTAR) 100 UNIT/ML Solostar Pen Inject 20 Units into the skin daily.  Marland Kitchen losartan (COZAAR) 100 MG tablet Take 0.5 tablets (50 mg total) by mouth daily.  . metFORMIN (GLUCOPHAGE) 1000 MG tablet Take 1 tablet (1,000 mg total) by mouth 2 (two) times daily with a meal.  . [DISCONTINUED] amLODipine (NORVASC) 10 MG tablet Take 10 mg by mouth daily.  . [DISCONTINUED] atorvastatin (LIPITOR) 20 MG tablet Take 1 tablet (20 mg total) by mouth daily.  . [DISCONTINUED] glipiZIDE (GLUCOTROL) 10 MG tablet Take 1 tablet (10 mg total) by mouth 2 (two) times daily before a meal.  . [DISCONTINUED] hydrochlorothiazide (HYDRODIURIL) 25 MG tablet Take 1 tablet (25 mg total) by mouth daily.  . [DISCONTINUED] insulin aspart (NOVOLOG) 100 UNIT/ML injection Inject 6-10 Units into the skin 2 (two) times daily after a meal. Sliding scale  . [DISCONTINUED] Insulin Glargine (LANTUS SOLOSTAR) 100 UNIT/ML Solostar Pen Inject 20 Units into the skin daily.  . [DISCONTINUED] losartan (COZAAR) 100 MG tablet Take 0.5 tablets (50 mg total) by mouth daily.  . [DISCONTINUED] metFORMIN (GLUCOPHAGE) 1000 MG tablet Take 1 tablet (1,000 mg total) by mouth 2 (two) times daily with a meal.  . Blood Glucose Monitoring Suppl (TRUE METRIX METER)  DEVI 1 kit by Does not apply route daily. Use as instructed to check blood sugar daily.  Marland Kitchen glucose blood (TRUE METRIX BLOOD GLUCOSE TEST) test strip Use as instructed to check blood sugar daily.  . Melatonin 5  MG CAPS Take 1 capsule (5 mg total) by mouth at bedtime. (Patient not taking: Reported on 08/09/2019)  . TRUEplus Lancets 28G MISC Use as instructed to check blood sugar daily.  . [DISCONTINUED] losartan-hydrochlorothiazide (HYZAAR) 100-25 MG tablet Take 1 tablet by mouth daily.   No facility-administered encounter medications on file as of 11/30/2019.  Diagnoses and all orders for this visit:  Type 2 diabetes mellitus with complication, without long-term current use of insulin ([facility]) -     Glucose (CBG) -     HgB A1c  Comprehensive diabetic foot examination, type 2 DM, encounter for [facility]) Sensory exam of the foot is normal, tested with the monofilament. Good pulses, no lesions or ulcers, good peripheral pulses.  Essential hypertension, benign Improved  low-sodium, DASH diet, medication compliance, 150 minutes of moderate intensity exercise per week. Discussed medication compliance, adverse effects. Continue HCTZ 47m, Cozzar 573m, amlodipine 107mDaily   Mixed hyperlipidemia Decrease your fatty foods, red meat, cheese, milk and increase fiber like whole grains and veggies. Continue atorvastatin 26m75m bedtime -     Lipid panel; Future  Tobacco dependence He is aware of increased risk for lung cancer and other respiratory diseases recommend cessation.  This will be reminded at each clinical visit.  Hypokalemia Prescribed [id number] from review of labs in ED   Other orders/Medication refill -     insulin aspart (NOVOLOG) 100 UNIT/ML injection; Inject 6-10 Units into the skin 2 (two) times daily after a meal. Sliding scale -     losartan (COZAAR) 100 MG tablet; Take 0.5 tablets (50 mg total) by mouth daily. -     hydrochlorothiazide (HYDRODIURIL) 25 MG tablet; Take 1  tablet (25 mg total) by mouth daily. -     insulin glargine (LANTUS SOLOSTAR) 100 UNIT/ML Solostar Pen; Inject 20 Units into the skin daily. -     atorvastatin (LIPITOR) 20 MG tablet; Take 1 tablet (20 mg total) by mouth daily. -     amLODipine (NORVASC) 10 MG tablet; Take 1 tablet (10 mg total) by mouth daily. -     glipiZIDE (GLUCOTROL) 10 MG tablet; Take 1 tablet (10 mg total) by mouth 2 (two) times daily before a meal. -     metFORMIN (GLUCOPHAGE) 1000 MG tablet; Take 1 tablet (1,000 mg total) by mouth 2 (two) times daily with a meal.    Follow-up: Return in about 3 months (around 02/29/2020) for in person diabetes.   MicheKerin Perna

## 2019-11-30 NOTE — Progress Notes (Signed)
F/u DM - not compliant on medications.  Feeling fatigue and no energy Lightheaded x 3 weeks

## 2019-11-30 NOTE — Patient Instructions (Signed)

## 2020-02-11 ENCOUNTER — Emergency Department (HOSPITAL_COMMUNITY): Payer: Self-pay

## 2020-02-11 ENCOUNTER — Emergency Department (HOSPITAL_COMMUNITY)
Admission: EM | Admit: 2020-02-11 | Discharge: 2020-02-11 | Disposition: A | Discharge status: Home / Self Care | Payer: Self-pay | Attending: Emergency Medicine | Admitting: Emergency Medicine

## 2020-02-11 ENCOUNTER — Encounter (HOSPITAL_COMMUNITY): Payer: Self-pay | Admitting: Emergency Medicine

## 2020-02-11 ENCOUNTER — Other Ambulatory Visit: Payer: Self-pay

## 2020-02-11 DIAGNOSIS — R079 Chest pain, unspecified: Secondary | ICD-10-CM | POA: Insufficient documentation

## 2020-02-11 DIAGNOSIS — R1013 Epigastric pain: Secondary | ICD-10-CM | POA: Insufficient documentation

## 2020-02-11 DIAGNOSIS — I10 Essential (primary) hypertension: Secondary | ICD-10-CM | POA: Insufficient documentation

## 2020-02-11 DIAGNOSIS — F17210 Nicotine dependence, cigarettes, uncomplicated: Secondary | ICD-10-CM | POA: Insufficient documentation

## 2020-02-11 DIAGNOSIS — R0602 Shortness of breath: Secondary | ICD-10-CM | POA: Insufficient documentation

## 2020-02-11 DIAGNOSIS — Z794 Long term (current) use of insulin: Secondary | ICD-10-CM | POA: Insufficient documentation

## 2020-02-11 DIAGNOSIS — K30 Functional dyspepsia: Secondary | ICD-10-CM | POA: Insufficient documentation

## 2020-02-11 DIAGNOSIS — Z79899 Other long term (current) drug therapy: Secondary | ICD-10-CM | POA: Insufficient documentation

## 2020-02-11 DIAGNOSIS — E119 Type 2 diabetes mellitus without complications: Secondary | ICD-10-CM | POA: Insufficient documentation

## 2020-02-11 LAB — CBC
HCT: 40.9 % (ref 39.0–52.0)
Hemoglobin: 14.8 g/dL (ref 13.0–17.0)
MCH: 30.7 pg (ref 26.0–34.0)
MCHC: 36.2 g/dL — ABNORMAL HIGH (ref 30.0–36.0)
MCV: 84.9 fL (ref 80.0–100.0)
Platelets: 251 10*3/uL (ref 150–400)
RBC: 4.82 MIL/uL (ref 4.22–5.81)
RDW: 12.8 % (ref 11.5–15.5)
WBC: 7.8 10*3/uL (ref 4.0–10.5)
nRBC: 0 % (ref 0.0–0.2)

## 2020-02-11 LAB — HEPATIC FUNCTION PANEL
ALT: 35 U/L (ref 0–44)
AST: 27 U/L (ref 15–41)
Albumin: 4 g/dL (ref 3.5–5.0)
Alkaline Phosphatase: 47 U/L (ref 38–126)
Bilirubin, Direct: 0.1 mg/dL (ref 0.0–0.2)
Indirect Bilirubin: 0.8 mg/dL (ref 0.3–0.9)
Total Bilirubin: 0.9 mg/dL (ref 0.3–1.2)
Total Protein: 7.6 g/dL (ref 6.5–8.1)

## 2020-02-11 LAB — BASIC METABOLIC PANEL
Anion gap: 11 (ref 5–15)
BUN: 8 mg/dL (ref 6–20)
CO2: 23 mmol/L (ref 22–32)
Calcium: 9.1 mg/dL (ref 8.9–10.3)
Chloride: 100 mmol/L (ref 98–111)
Creatinine, Ser: 0.88 mg/dL (ref 0.61–1.24)
GFR calc Af Amer: >60 mL/min (ref >60)
GFR calc non Af Amer: >60 mL/min (ref >60)
Glucose, Bld: 328 mg/dL — ABNORMAL HIGH (ref 70–99)
Potassium: 3.6 mmol/L (ref 3.5–5.1)
Sodium: 134 mmol/L — ABNORMAL LOW (ref 135–145)

## 2020-02-11 LAB — TROPONIN I (HIGH SENSITIVITY)
Troponin I (High Sensitivity): 14 ng/L (ref <18)
Troponin I (High Sensitivity): 8 ng/L (ref <18)

## 2020-02-11 LAB — LIPASE, BLOOD: Lipase: 24 U/L (ref 11–51)

## 2020-02-11 LAB — CBG MONITORING, ED: Glucose-Capillary: 203 mg/dL — ABNORMAL HIGH (ref 70–99)

## 2020-02-11 MED ORDER — SUCRALFATE 1 G PO TABS
1.0000 g | ORAL_TABLET | Freq: Three times a day (TID) | ORAL | 0 refills | Status: DC
Start: 2020-02-11 — End: 2020-06-19

## 2020-02-11 MED ORDER — OMEPRAZOLE 20 MG PO CPDR
20.0000 mg | DELAYED_RELEASE_CAPSULE | Freq: Every day | ORAL | 0 refills | Status: DC
Start: 2020-02-11 — End: 2020-08-03

## 2020-02-11 MED ORDER — SUCRALFATE 1 G PO TABS
1.0000 g | ORAL_TABLET | Freq: Once | ORAL | Status: AC
  Administered 2020-02-11: 1 g via ORAL
  Filled 2020-02-11: qty 1

## 2020-02-11 MED ORDER — SODIUM CHLORIDE 0.9 % IV BOLUS
1000.0000 mL | Freq: Once | INTRAVENOUS | Status: AC
  Administered 2020-02-11: 1000 mL via INTRAVENOUS

## 2020-02-11 MED ORDER — FAMOTIDINE 20 MG PO TABS
10.0000 mg | ORAL_TABLET | Freq: Once | ORAL | Status: AC
  Administered 2020-02-11: 10 mg via ORAL
  Filled 2020-02-11: qty 1

## 2020-02-11 MED ORDER — SODIUM CHLORIDE 0.9% FLUSH: 3.0000 mL | Freq: Once | INTRAVENOUS | Status: DC

## 2020-02-11 MED ORDER — ALUM & MAG HYDROXIDE-SIMETH 200-200-20 MG/5ML PO SUSP
30.0000 mL | Freq: Once | ORAL | Status: AC
  Administered 2020-02-11: 30 mL via ORAL
  Filled 2020-02-11: qty 30

## 2020-02-11 MED ORDER — LIDOCAINE VISCOUS HCL 2 % MT SOLN
15.0000 mL | Freq: Once | OROMUCOSAL | Status: AC
  Administered 2020-02-11: 15 mL via ORAL
  Filled 2020-02-11: qty 15

## 2020-02-11 NOTE — ED Triage Notes (Signed)
C/o pain to center of chest that started while drinking ETOH last night.  Reports sob, nausea, and vomited this morning.

## 2020-02-11 NOTE — Discharge Instructions (Signed)
Your history, exam, and work-up today are less consistent with a cardiac cause of your discomfort and more related to reflux and indigestion.  Please take the acid medications to help with your symptoms and rest and stay hydrated.  Please follow-up with a GI doctor for further evaluation.  If any symptoms change or worsen, please return to the nearest emergency department.  Please avoid spicy and greasy foods.

## 2020-02-11 NOTE — ED Provider Notes (Signed)
[facility] DEPART[facility] Provider Note   CSN: [id number] Arrival date & time: 02/11/20  1408     History Chief Complaint  Patient presents with  . Chest Pain    Brandon Mcneil is a 39 y.o. male.  The history is provided by the patient and medical records.  Chest Pain Pain location:  Substernal area and epigastric Pain quality: aching, burning and dull   Pain radiates to:  Epigastrium Pain severity:  Severe Onset quality:  Gradual Duration:  1 day Timing:  Constant Progression:  Waxing and waning Chronicity:  New Relieved by:  Nothing Worsened by:  Certain positions (alcohol) Ineffective treatments:  None tried Associated symptoms: abdominal pain, heartburn, nausea, shortness of breath and vomiting   Associated symptoms: no altered mental status, no anxiety, no back pain, no claudication, no cough, no diaphoresis, no dizziness, no fatigue, no fever, no headache, no lower extremity edema, no near-syncope, no numbness, no palpitations and no weakness   Risk factors: diabetes mellitus, high cholesterol, hypertension and male sex   Risk factors: no prior DVT/PE        Past Medical History:  Diagnosis Date  . Depression   . Diabetes ([place])   . Essential hypertension 02/03/2017  . Hypercholesteremia   . Hypertension     Patient Active Problem List   Diagnosis Date Noted  . Stress 02/08/2017  . Essential hypertension 02/03/2017  . Type 2 diabetes mellitus with complication, without long-term current use of insulin ([place]) 02/03/2017  . Medication overdose 10/31/2013  . Bipolar I disorder, most recent episode depressed ([place]) 09/11/2013  . Tobacco use disorder 09/11/2013  . Newly diagnosed diabetes ([place]) 09/11/2013  . Dehydration 09/11/2013  . Pain, dental 09/11/2013  . DKA (diabetic ketoacidoses) ([place]) 09/10/2013    History reviewed. No pertinent surgical history.     No family history on file.  Social History   Tobacco Use  .  Smoking status: Current Every Day Smoker    Packs/day: 0.50    Years: 14.00    Pack years: 7.00    Types: Cigarettes  . Smokeless tobacco: Never Used  . Tobacco comment: 1/2 pack a day  Substance Use Topics  . Alcohol use: Yes    Alcohol/week: 2.0 standard drinks    Types: 2 Cans of beer per week    Comment: occasional  . Drug use: No    Types: Marijuana    Comment: [employer] former user.  Last use in September 2017    Home Medications Prior to Admission medications   Medication Sig Start Date End Date Taking? Authorizing Provider  amLODipine (NORVASC) 10 MG tablet Take 1 tablet (10 mg total) by mouth daily. 11/30/19   Kerin Perna, NP  aspirin 81 MG tablet Take 1 tablet (81 mg total) by mouth daily. 02/03/17   Mack Hook, MD  atorvastatin (LIPITOR) 20 MG tablet Take 1 tablet (20 mg total) by mouth daily. 11/30/19   Kerin Perna, NP  Blood Glucose Monitoring Suppl (TRUE METRIX METER) DEVI 1 kit by Does not apply route daily. Use as instructed to check blood sugar daily. 08/09/19   Charlott Rakes, MD  glipiZIDE (GLUCOTROL) 10 MG tablet Take 1 tablet (10 mg total) by mouth 2 (two) times daily before a meal. 11/30/19   Kerin Perna, NP  glucose blood (TRUE METRIX BLOOD GLUCOSE TEST) test strip Use as instructed to check blood sugar daily. 08/09/19   Charlott Rakes, MD  hydrochlorothiazide (HYDRODIURIL) 25 MG tablet  Take 1 tablet (25 mg total) by mouth daily. 11/30/19   Kerin Perna, NP  ibuprofen (ADVIL) 200 MG tablet Take 200 mg by mouth every 6 (six) hours as needed for mild pain.    [provider]  insulin aspart (NOVOLOG) 100 UNIT/ML injection Inject 6-10 Units into the skin 2 (two) times daily after a meal. Sliding scale 11/30/19   Kerin Perna, NP  insulin glargine (LANTUS SOLOSTAR) 100 UNIT/ML Solostar Pen Inject 20 Units into the skin daily. 11/30/19   Kerin Perna, NP  losartan (COZAAR) 100 MG tablet Take 0.5 tablets (50 mg total)  by mouth daily. 11/30/19   Kerin Perna, NP  Melatonin 5 MG CAPS Take 1 capsule (5 mg total) by mouth at bedtime. Patient not taking: Reported on 08/09/2019 05/10/19   Kerin Perna, NP  metFORMIN (GLUCOPHAGE) 1000 MG tablet Take 1 tablet (1,000 mg total) by mouth 2 (two) times daily with a meal. 11/30/19   Kerin Perna, NP  potassium chloride SA (KLOR-CON) 10 MEQ tablet Take 2 tablets (20 mEq total) by mouth daily. 11/30/19   Kerin Perna, NP  TRUEplus Lancets 28G MISC Use as instructed to check blood sugar daily. 08/09/19   Charlott Rakes, MD  losartan-hydrochlorothiazide (HYZAAR) 100-25 MG tablet Take 1 tablet by mouth daily. 05/10/19 07/07/19  Kerin Perna, NP    Allergies    Lisinopril and Penicillins  Review of Systems   Review of Systems  Constitutional: Negative for chills, diaphoresis, fatigue and fever.  HENT: Negative for congestion.   Eyes: Negative for visual disturbance.  Respiratory: Positive for shortness of breath. Negative for cough, chest tightness and wheezing.   Cardiovascular: Positive for chest pain. Negative for palpitations, claudication, leg swelling and near-syncope.  Gastrointestinal: Positive for abdominal pain, heartburn, nausea and vomiting. Negative for abdominal distention, constipation and diarrhea.  Musculoskeletal: Negative for back pain, neck pain and neck stiffness.  Skin: Negative for rash.  Neurological: Negative for dizziness, weakness, light-headedness, numbness and headaches.  Psychiatric/Behavioral: Negative for agitation.    Physical Exam Updated Vital Signs BP (!) 162/95 (BP Location: Right Arm)   Pulse 92   Temp 98.7 F (37.1 C) (Oral)   Resp 16   SpO2 99%   Physical Exam Vitals and nursing note reviewed.  Constitutional:      General: He is not in acute distress.    Appearance: He is well-developed. He is not ill-appearing, toxic-appearing or diaphoretic.  HENT:     Head: Normocephalic and  atraumatic.  Eyes:     Conjunctiva/sclera: Conjunctivae normal.  Cardiovascular:     Rate and Rhythm: Normal rate and regular rhythm.     Heart sounds: No murmur heard.   Pulmonary:     Effort: Pulmonary effort is normal. No respiratory distress.     Breath sounds: Normal breath sounds. No decreased breath sounds, wheezing, rhonchi or rales.  Chest:     Chest wall: No tenderness.  Abdominal:     Palpations: Abdomen is soft.     Tenderness: There is abdominal tenderness.  Musculoskeletal:     Cervical back: Neck supple.     Right lower leg: No tenderness. No edema.     Left lower leg: No tenderness. No edema.  Skin:    General: Skin is warm and dry.     Capillary Refill: Capillary refill takes less than 2 seconds.     Findings: No erythema.  Neurological:     General: No focal  deficit present.     Mental Status: He is alert.  Psychiatric:        Mood and Affect: Mood normal.     ED Results / Procedures / Treatments   Labs (all labs ordered are listed, but only abnormal results are displayed) Labs Reviewed  BASIC METABOLIC PANEL - Abnormal; Notable for the following components:      Result Value   Sodium 134 (*)    Glucose, Bld 328 (*)    All other components within normal limits  CBC - Abnormal; Notable for the following components:   MCHC 36.2 (*)    All other components within normal limits  CBG MONITORING, ED - Abnormal; Notable for the following components:   Glucose-Capillary 203 (*)    All other components within normal limits  HEPATIC FUNCTION PANEL  LIPASE, BLOOD  TROPONIN I (HIGH SENSITIVITY)  TROPONIN I (HIGH SENSITIVITY)    EKG EKG Interpretation  Date/Time:  Sunday February 11 2020 15:56:44 EDT Ventricular Rate:  88 PR Interval:  144 QRS Duration: 103 QT Interval:  361 QTC Calculation: 437 R Axis:   -28 Text Interpretation: Sinus rhythm Incomplete RBBB and LAFB RSR' in V1 or V2, right VCD or RVH When compared to prior, no significant changes seen.  No sTEMI Confirmed by Antony Blackbird [phone]) on 02/11/2020 3:59:57 PM   Radiology DG Chest 2 View  Result Date: 02/11/2020 CLINICAL DATA:  Chest pain beginning last night. Shortness of breath. Nausea and vomiting. EXAM: CHEST - 2 VIEW COMPARISON:  02/05/2019 FINDINGS: The heart size and mediastinal contours are within normal limits. Both lungs are clear. The visualized skeletal structures are unremarkable. IMPRESSION: No active cardiopulmonary disease. Electronically Signed   By: Marlaine Hind M.D.   On: 02/11/2020 15:10    Procedures Procedures (including critical care time)  Medications Ordered in ED Medications  sodium chloride flush (NS) 0.9 % injection 3 mL (has no administration in time range)  alum & mag hydroxide-simeth (MAALOX/MYLANTA) 200-200-20 MG/5ML suspension 30 mL (30 mLs Oral Given 02/11/20 1711)    And  lidocaine (XYLOCAINE) 2 % viscous mouth solution 15 mL (15 mLs Oral Given 02/11/20 1711)  sodium chloride 0.9 % bolus 1,000 mL (0 mLs Intravenous Stopped 02/11/20 1905)  famotidine (PEPCID) tablet 10 mg (10 mg Oral Given 02/11/20 1711)  sucralfate (CARAFATE) tablet 1 g (1 g Oral Given 02/11/20 1712)    ED Course  I have reviewed the triage vital signs and the nursing notes.  Pertinent labs & imaging results that were available during my care of the patient were reviewed by me and considered in my medical decision making (see chart for details).    MDM Rules/Calculators/A&P                          WILMON CONOVER is a 39 y.o. male with a past medical history significant for diabetes with prior DKA, hypertension, hypercholesterolemia, and prior depression who presents with chest discomfort.  Patient reports that he was drinking heavily last night and during his drinking and eating pizza he developed pain in his central chest and left upper abdomen.  He reports some nausea and vomiting this morning but the pain began before this.  He denies a history of Mallory-Weiss or  [place] tears.  Denies any hematemesis.  He reports he has had no recent fevers, chills, or congestion but has had occasional cough.  Denies any recent trauma.  He reports that it  is worse when he lays down and he thinks it may be indigestion but he want to make sure it was not his heart.  He denies any history of heart disease.  He reports some shortness of breath with the pain.  He reports it is a burning and aching type pain.  He has not ever taken any medicine for reflux.  He denies any leg pain or leg swelling or history of DVT or PE.  Denies any urinary symptoms or constipation/diarrhea.  Denies other complaints.  On exam, lungs are clear and chest is nontender.  Epigastrium is slightly tender to palpation.  Normal bowel sounds.  No flank or back tenderness.  No focal neurologic deficits.  Good pulses in all extremities.  Legs are nontender nonedematous.  Patient is resting comfortably.  EKG shows no STEMI.  Patient had some screening blood work and imaging and x-ray was reassuring with no evidence of subcutaneous gas pneumothorax or pneumonia.  Doubt a esophageal tear at this time.  No wide mediastinum.  Patient's labs began to return showing a normal kidney function and no leukocytosis or anemia.  Initial troponin is negative.  Will trend.  We will add a lipase and hepatic function panel but clinically I am most concerned about an alcohol gastritis.  Will give GI cocktail, Carafate, and Pepcid.  We will give him fluids for some dehydration.  If work-up continues to be reassuring and he is feeling better, dissipate discharge home with PCP follow-up.  Patient agrees with this plan.  Patient's symptoms improved and labs were reassuring.  We feel he safe for discharge home.  He will be started on Prilosec and Carafate and will follow up with a GI doctor.  He agrees with this plan and patient was discharged in good condition.  Final Clinical Impression(s) / ED Diagnoses Final diagnoses:  Nonspecific  chest pain  Indigestion    Rx / DC Orders ED Discharge Orders         Ordered    omeprazole (PRILOSEC) 20 MG capsule  Daily     Discontinue  Reprint     02/11/20 2207    sucralfate (CARAFATE) 1 g tablet  3 times daily with meals & bedtime     Discontinue  Reprint     02/11/20 2207         Clinical Impression: 1. Nonspecific chest pain   2. Indigestion     Disposition: Discharge  Condition: Good  I have discussed the results, Dx and Tx plan with the pt(& family if present). He/she/they expressed understanding and agree(s) with the plan. Discharge instructions discussed at great length. Strict return precautions discussed and pt &/or family have verbalized understanding of the instructions. No further questions at time of discharge.    Discharge Medication List as of 02/11/2020 10:09 PM    START taking these medications   Details  omeprazole (PRILOSEC) 20 MG capsule Take 1 capsule (20 mg total) by mouth daily., Starting Sun 02/11/2020, Print    sucralfate (CARAFATE) 1 g tablet Take 1 tablet (1 g total) by mouth 4 (four) times daily -  with meals and at bedtime., Starting Sun 02/11/2020, Print        Follow Up: Kerin Perna, NP [address] [place] [address] [phone]     [facility] [employer], [place] [address] [phone]   with [place] [address] [id number] [place]  [phone]       Eldonna Neuenfeldt, Gwenyth Allegra, MD 02/11/20 [phone]

## 2020-02-21 ENCOUNTER — Emergency Department (HOSPITAL_COMMUNITY)
Admission: EM | Admit: 2020-02-21 | Discharge: 2020-02-21 | Disposition: A | Discharge status: Home / Self Care | Payer: Self-pay | Attending: Emergency Medicine | Admitting: Emergency Medicine

## 2020-02-21 ENCOUNTER — Emergency Department (HOSPITAL_COMMUNITY): Payer: Self-pay

## 2020-02-21 ENCOUNTER — Encounter (HOSPITAL_COMMUNITY): Payer: Self-pay | Admitting: Emergency Medicine

## 2020-02-21 ENCOUNTER — Other Ambulatory Visit: Payer: Self-pay

## 2020-02-21 ENCOUNTER — Encounter (HOSPITAL_COMMUNITY): Payer: Self-pay

## 2020-02-21 DIAGNOSIS — R042 Hemoptysis: Secondary | ICD-10-CM | POA: Insufficient documentation

## 2020-02-21 DIAGNOSIS — Z794 Long term (current) use of insulin: Secondary | ICD-10-CM | POA: Insufficient documentation

## 2020-02-21 DIAGNOSIS — I10 Essential (primary) hypertension: Secondary | ICD-10-CM | POA: Insufficient documentation

## 2020-02-21 DIAGNOSIS — Z7982 Long term (current) use of aspirin: Secondary | ICD-10-CM | POA: Insufficient documentation

## 2020-02-21 DIAGNOSIS — E1110 Type 2 diabetes mellitus with ketoacidosis without coma: Secondary | ICD-10-CM | POA: Insufficient documentation

## 2020-02-21 DIAGNOSIS — Z79899 Other long term (current) drug therapy: Secondary | ICD-10-CM | POA: Insufficient documentation

## 2020-02-21 DIAGNOSIS — R059 Cough, unspecified: Secondary | ICD-10-CM

## 2020-02-21 DIAGNOSIS — Z20822 Contact with and (suspected) exposure to covid-19: Secondary | ICD-10-CM | POA: Insufficient documentation

## 2020-02-21 DIAGNOSIS — F17210 Nicotine dependence, cigarettes, uncomplicated: Secondary | ICD-10-CM | POA: Insufficient documentation

## 2020-02-21 DIAGNOSIS — Z720 Tobacco use: Secondary | ICD-10-CM

## 2020-02-21 DIAGNOSIS — Z5321 Procedure and treatment not carried out due to patient leaving prior to being seen by health care provider: Secondary | ICD-10-CM | POA: Insufficient documentation

## 2020-02-21 DIAGNOSIS — R1110 Vomiting, unspecified: Secondary | ICD-10-CM | POA: Insufficient documentation

## 2020-02-21 DIAGNOSIS — R05 Cough: Secondary | ICD-10-CM | POA: Insufficient documentation

## 2020-02-21 LAB — CBG MONITORING, ED: Glucose-Capillary: 347 mg/dL — ABNORMAL HIGH (ref 70–99)

## 2020-02-21 LAB — SARS CORONAVIRUS 2 BY RT PCR (HOSPITAL ORDER, PERFORMED IN ~~LOC~~ HOSPITAL LAB): SARS Coronavirus 2: NEGATIVE

## 2020-02-21 NOTE — ED Notes (Signed)
Pt states he is going to [facility]

## 2020-02-21 NOTE — ED Provider Notes (Signed)
[facility] DEPT Provider Note   CSN: [id number] Arrival date & time: 02/21/20  1521     History Chief Complaint  Patient presents with  . Cough  . Hematemesis    Brandon Mcneil is a 39 y.o. male with PMH of IDDM, HTN, and HLD who presents to the ED with a 4-day history of cough productive of blood-tinged sputum.  Patient endorses a 30-pack-year smoking history, smoking approximately 1.5 packs/day x 20 years.  He reports that his cough is typically triggered by smoking tobacco because it makes his mouth feel dry.  He then subsequently will cough vigorously to the point that he may even vomit.  He noticed that he had mild blood-tinged sputum and some of his recent coughing episodes.  He states that he believes he has bronchitis.  He denies any fevers or chills, chest pain, difficulty breathing, abdominal pain, nausea, obvious sick contacts, pleuritic chest symptoms, history of clots, unilateral extremity edema, or other symptoms.  He has not yet had his COVID-19 vaccination, but states that he was tested negative last week while asymptomatic.    HPI     Past Medical History:  Diagnosis Date  . Depression   . Diabetes ([place])   . Essential hypertension 02/03/2017  . Hypercholesteremia   . Hypertension     Patient Active Problem List   Diagnosis Date Noted  . Stress 02/08/2017  . Essential hypertension 02/03/2017  . Type 2 diabetes mellitus with complication, without long-term current use of insulin ([place]) 02/03/2017  . Medication overdose 10/31/2013  . Bipolar I disorder, most recent episode depressed ([place]) 09/11/2013  . Tobacco use disorder 09/11/2013  . Newly diagnosed diabetes ([place]) 09/11/2013  . Dehydration 09/11/2013  . Pain, dental 09/11/2013  . DKA (diabetic ketoacidoses) ([place]) 09/10/2013    History reviewed. No pertinent surgical history.     No family history on file.  Social History   Tobacco Use  . Smoking status: Current  Every Day Smoker    Packs/day: 0.50    Years: 14.00    Pack years: 7.00    Types: Cigarettes  . Smokeless tobacco: Never Used  . Tobacco comment: 1/2 pack a day  Substance Use Topics  . Alcohol use: Yes    Alcohol/week: 2.0 standard drinks    Types: 2 Cans of beer per week    Comment: occasional  . Drug use: No    Types: Marijuana    Comment: MJ former user.  Last use in September 2017    Home Medications Prior to Admission medications   Medication Sig Start Date End Date Taking? Authorizing Provider  amLODipine (NORVASC) 10 MG tablet Take 1 tablet (10 mg total) by mouth daily. 11/30/19   Kerin Perna, NP  aspirin 81 MG tablet Take 1 tablet (81 mg total) by mouth daily. 02/03/17   Mack Hook, MD  atorvastatin (LIPITOR) 20 MG tablet Take 1 tablet (20 mg total) by mouth daily. 11/30/19   Kerin Perna, NP  Blood Glucose Monitoring Suppl (TRUE METRIX METER) DEVI 1 kit by Does not apply route daily. Use as instructed to check blood sugar daily. 08/09/19   Charlott Rakes, MD  glipiZIDE (GLUCOTROL) 10 MG tablet Take 1 tablet (10 mg total) by mouth 2 (two) times daily before a meal. 11/30/19   Kerin Perna, NP  glucose blood (TRUE METRIX BLOOD GLUCOSE TEST) test strip Use as instructed to check blood sugar daily. 08/09/19   Charlott Rakes, MD  hydrochlorothiazide (  HYDRODIURIL) 25 MG tablet Take 1 tablet (25 mg total) by mouth daily. 11/30/19   Kerin Perna, NP  insulin aspart (NOVOLOG) 100 UNIT/ML injection Inject 6-10 Units into the skin 2 (two) times daily after a meal. Sliding scale Patient taking differently: Inject 6-15 Units into the skin See admin instructions. Inject 6-15 units into the skin two times a day after meals, per sliding scale 11/30/19   Kerin Perna, NP  insulin glargine (LANTUS SOLOSTAR) 100 UNIT/ML Solostar Pen Inject 20 Units into the skin daily. Patient taking differently: Inject 20 Units into the skin daily before breakfast.   11/30/19   Kerin Perna, NP  losartan (COZAAR) 100 MG tablet Take 0.5 tablets (50 mg total) by mouth daily. 11/30/19   Kerin Perna, NP  Melatonin 5 MG CAPS Take 1 capsule (5 mg total) by mouth at bedtime. Patient not taking: Reported on 02/11/2020 05/10/19   Kerin Perna, NP  metFORMIN (GLUCOPHAGE) 1000 MG tablet Take 1 tablet (1,000 mg total) by mouth 2 (two) times daily with a meal. 11/30/19   Kerin Perna, NP  omeprazole (PRILOSEC) 20 MG capsule Take 1 capsule (20 mg total) by mouth daily. 02/11/20   Tegeler, Gwenyth Allegra, MD  potassium chloride SA (KLOR-CON) 10 MEQ tablet Take 2 tablets (20 mEq total) by mouth daily. Patient taking differently: Take 10 mEq by mouth daily.  11/30/19   Kerin Perna, NP  sucralfate (CARAFATE) 1 g tablet Take 1 tablet (1 g total) by mouth 4 (four) times daily -  with meals and at bedtime. 02/11/20   Tegeler, Gwenyth Allegra, MD  TRUEplus Lancets 28G MISC Use as instructed to check blood sugar daily. 08/09/19   Charlott Rakes, MD  losartan-hydrochlorothiazide (HYZAAR) 100-25 MG tablet Take 1 tablet by mouth daily. 05/10/19 07/07/19  Kerin Perna, NP    Allergies    Lisinopril and Penicillins  Review of Systems   Review of Systems  Constitutional: Negative for chills and fever.  Respiratory: Positive for cough. Negative for shortness of breath, wheezing and stridor.   Cardiovascular: Negative for chest pain, palpitations and leg swelling.  Gastrointestinal: Positive for vomiting. Negative for nausea.    Physical Exam Updated Vital Signs BP (!) 132/91 (BP Location: Right Arm)   Pulse 85   Temp 97.8 F (36.6 C) (Oral)   Resp 18   SpO2 100%   Physical Exam Vitals and nursing note reviewed. Exam conducted with a chaperone present.  Constitutional:      General: He is not in acute distress.    Appearance: Normal appearance. He is not ill-appearing.  HENT:     Head: Normocephalic and atraumatic.     Nose: Nose normal.      Mouth/Throat:     Pharynx: Oropharynx is clear. No posterior oropharyngeal erythema.  Eyes:     General: No scleral icterus.    Conjunctiva/sclera: Conjunctivae normal.  Cardiovascular:     Rate and Rhythm: Normal rate and regular rhythm.     Pulses: Normal pulses.     Heart sounds: Normal heart sounds.  Pulmonary:     Effort: Pulmonary effort is normal. No respiratory distress.     Breath sounds: Normal breath sounds. No wheezing or rales.  Musculoskeletal:     Cervical back: Normal range of motion.     Right lower leg: No edema.     Left lower leg: No edema.  Skin:    General: Skin is dry.  Capillary Refill: Capillary refill takes less than 2 seconds.  Neurological:     Mental Status: He is alert and oriented to person, place, and time.     GCS: GCS eye subscore is 4. GCS verbal subscore is 5. GCS motor subscore is 6.  Psychiatric:        Mood and Affect: Mood normal.        Behavior: Behavior normal.        Thought Content: Thought content normal.     ED Results / Procedures / Treatments   Labs (all labs ordered are listed, but only abnormal results are displayed) Labs Reviewed  CBG MONITORING, ED - Abnormal; Notable for the following components:      Result Value   Glucose-Capillary 347 (*)    All other components within normal limits  SARS CORONAVIRUS 2 BY RT PCR (HOSPITAL ORDER, [facility])    EKG None  Radiology DG Chest 2 View  Result Date: 02/21/2020 CLINICAL DATA:  Cough. EXAM: CHEST - 2 VIEW COMPARISON:  February 11, 2020 FINDINGS: The heart size and mediastinal contours are within normal limits. Both lungs are clear. The visualized skeletal structures are unremarkable. IMPRESSION: No active cardiopulmonary disease. Electronically Signed   By: Virgina Norfolk M.D.   On: 02/21/2020 17:55    Procedures Procedures (including critical care time)  Medications Ordered in ED Medications - No data to display  ED Course  I have  reviewed the triage vital signs and the nursing notes.  Pertinent labs & imaging results that were available during my care of the patient were reviewed by me and considered in my medical decision making (see chart for details).    MDM Rules/Calculators/A&P                          Patient reports that he has been having a 3-day history of episodic coughing fits that are precipitated almost exclusively by smoking tobacco.  He does endorse some blood-tinged sputum, but denies any fevers or chills or other symptoms of upper respiratory infection concerning for bronchitis.  While patient was endorsing blood-tinged sputum, he denied any hematemesis, contrary to triage note.  No recent alcohol binging or history concerning for a Mallory-Weiss tear or Boerhaave syndrome.  Patient is hemodynamically stable and in note acute distress.   Will obtain plain films to evaluate for pneumonia or other acute chest disease.  Obtained CBG per patient request which I felt was reasonable given his IDDM and concern for hyperglycemia.  Do not feel as though further laboratory work-up or imaging would yield any significant findings.  Patient would be negative for [facility] and Well's Criteria for PE if not for his reported hemoptysis.  Regardless, low suspicion for pulmonary embolism at this time.  Patient denies any pleuritic symptoms, history of clots, clotting disorder, recent surgery or immobilization, or recent unilateral extremity edema.  His only other risk factor is tobacco use.  POC CBG here in the ED was elevated at 347, relatively consistent with patient's baseline of IDDM.  He is not complaining of any abdominal discomfort or nausea symptoms.  Patient also notes that he had orange juice earlier which is why he was afraid that he may be hyperglycemic.  Plain films are personally reviewed and reveal no acute cardiopulmonary findings.    The patient was counseled on the dangers of tobacco use, and was advised to quit.   Reviewed strategies to maximize success,  including removing cigarettes and smoking materials from environment, stress management, substitution of other forms of reinforcement, support of family/friends and written materials. Total time was 5 min CPT code 99406.   All of the evaluation and work-up results were discussed with the patient and any family at bedside.  Patient and/or family were informed that while patient is appropriate for discharge at this time, some medical emergencies may only develop or become detectable after a period of time.  I specifically instructed patient and/or family to return to return to the ED or seek immediate medical attention for any new or worsening symptoms.  They were provided opportunity to ask any additional questions and have none at this time.  Prior to discharge patient is feeling well, agreeable with plan for discharge home.  They have expressed understanding of verbal discharge instructions as well as return precautions and are agreeable to the plan.   Brandon Mcneil was evaluated in Emergency Department on 02/21/2020 for the symptoms described in the history of present illness. He was evaluated in the context of the global COVID-19 pandemic, which necessitated consideration that the patient might be at risk for infection with the SARS-CoV-2 virus that causes COVID-19. Institutional protocols and algorithms that pertain to the evaluation of patients at risk for COVID-19 are in a state of rapid change based on information released by regulatory bodies including the CDC and federal and state organizations. These policies and algorithms were followed during the patient's care in the ED.   Final Clinical Impression(s) / ED Diagnoses Final diagnoses:  Cough  Tobacco use    Rx / DC Orders ED Discharge Orders    None       Reita Chard 02/21/20 Colon Flattery, MD 02/22/20 1018

## 2020-02-21 NOTE — ED Triage Notes (Signed)
Pt reports had a cough since Sunday. Today while at work noticed some bright red spots in his clear phlegm. Denies pains.

## 2020-02-21 NOTE — ED Triage Notes (Signed)
Pt reports cough that's started Monday, states yesterday he saw a little bit of blood in his sputum. States he drank a lot over the weekend and does not know if its related. Pt denies chest pain, sob or abd pain.

## 2020-02-21 NOTE — ED Notes (Signed)
Patient provided with bus pass

## 2020-02-21 NOTE — Discharge Instructions (Signed)
Please follow-up with your primary care provider regarding today's encounter.  Chest x-ray was reassuring.  I highly encourage you to pursue tobacco cessation strategies further with your primary care provider.  Your symptoms of cough and hemoptysis are directly related to your tobacco use, per your report.  Please continue to check your sugars regularly.   Please maintain isolation precautions pending results of your COVID-19 testing.  Return to the ED or seek immediate medical attention should you experience any new or worsening symptoms.

## 2020-02-29 ENCOUNTER — Ambulatory Visit (INDEPENDENT_AMBULATORY_CARE_PROVIDER_SITE_OTHER): Payer: Self-pay | Admitting: Primary Care

## 2020-03-06 ENCOUNTER — Encounter (INDEPENDENT_AMBULATORY_CARE_PROVIDER_SITE_OTHER): Payer: Self-pay | Admitting: Primary Care

## 2020-03-06 ENCOUNTER — Ambulatory Visit (INDEPENDENT_AMBULATORY_CARE_PROVIDER_SITE_OTHER): Payer: Self-pay | Admitting: Primary Care

## 2020-03-06 ENCOUNTER — Telehealth: Payer: Self-pay | Admitting: Primary Care

## 2020-03-06 ENCOUNTER — Other Ambulatory Visit: Payer: Self-pay

## 2020-03-06 VITALS — BP 123/78 | HR 69 | Temp 98.1°F | Ht 68.0 in | Wt 217.0 lb

## 2020-03-06 DIAGNOSIS — E118 Type 2 diabetes mellitus with unspecified complications: Secondary | ICD-10-CM

## 2020-03-06 DIAGNOSIS — Z760 Encounter for issue of repeat prescription: Secondary | ICD-10-CM

## 2020-03-06 LAB — POCT GLYCOSYLATED HEMOGLOBIN (HGB A1C): Hemoglobin A1C: 9.8 % — AB (ref 4.0–5.6)

## 2020-03-06 LAB — GLUCOSE, POCT (MANUAL RESULT ENTRY): POC Glucose: 240 mg/dl — AB (ref 70–99)

## 2020-03-06 MED ORDER — GLIPIZIDE 10 MG PO TABS: 10.0000 mg | ORAL_TABLET | Freq: Two times a day (BID) | ORAL | 1 refills | Status: DC

## 2020-03-06 MED ORDER — METFORMIN HCL 1000 MG PO TABS: 1000.0000 mg | ORAL_TABLET | Freq: Two times a day (BID) | ORAL | 1 refills | Status: DC

## 2020-03-06 MED ORDER — LOSARTAN POTASSIUM 100 MG PO TABS: 50.0000 mg | ORAL_TABLET | Freq: Every day | ORAL | 1 refills | Status: DC

## 2020-03-06 MED ORDER — HYDROCHLOROTHIAZIDE 25 MG PO TABS: 25.0000 mg | ORAL_TABLET | Freq: Every day | ORAL | 1 refills | Status: DC

## 2020-03-06 NOTE — Progress Notes (Signed)
Established Patient Office Visit  Subjective:  Patient ID: Brandon Mcneil, male    DOB: 07-Jun-1981  Age: 39 y.o. MRN: [id number]  CC:  Chief Complaint  Patient presents with  . Diabetes    Pt. is here for diabetes follow up.     HPI Brandon Mcneil presents for management of type 2 diabetes.  He denies any signs or symptoms of hypo or hyper glycemia.  Past Medical History:  Diagnosis Date  . Depression   . Diabetes ([place])   . Essential hypertension 02/03/2017  . Hypercholesteremia   . Hypertension     History reviewed. No pertinent surgical history.  Family History  Problem Relation Age of Onset  . Healthy Mother   . Healthy Father     Social History   Socioeconomic History  . Marital status: Single    Spouse name: Not on file  . Number of children: 3  . Years of education: 10th  . Highest education level: Not on file  Occupational History  . Occupation: unemployed  Tobacco Use  . Smoking status: Current Every Day Smoker    Packs/day: 0.50    Years: 14.00    Pack years: 7.00    Types: Cigarettes  . Smokeless tobacco: Never Used  . Tobacco comment: 1/2 pack a day  Substance and Sexual Activity  . Alcohol use: Yes    Alcohol/week: 2.0 standard drinks    Types: 2 Cans of beer per week    Comment: occasional  . Drug use: No    Types: Marijuana    Comment: MJ former user.  Last use in September 2017  . Sexual activity: Yes    Partners: Female    Birth control/protection: None  Other Topics Concern  . 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   Social Determinants of Health   Financial Resource Strain:   . Difficulty of Paying Living Expenses:   Food Insecurity:   . Worried About Charity fundraiser in the Last Year:   . Arboriculturist in the Last Year:   Transportation Needs:   . Film/video editor  (Medical):   Marland Kitchen Lack of Transportation (Non-Medical):   Physical Activity:   . Days of Exercise per Week:   . Minutes of Exercise per Session:   Stress:   . Feeling of Stress :   Social Connections:   . Frequency of Communication with Friends and Family:   . Frequency of Social Gatherings with Friends and Family:   . Attends Religious Services:   . Active Member of Clubs or Organizations:   . Attends Archivist Meetings:   Marland Kitchen Marital Status:   Intimate Partner Violence:   . Fear of Current or Ex-Partner:   . Emotionally Abused:   Marland Kitchen Physically Abused:   . Sexually Abused:     Outpatient Medications Prior to Visit  Medication Sig Dispense Refill  . amLODipine (NORVASC) 10 MG tablet Take 1 tablet (10 mg total) by mouth daily. 90 tablet 1  . aspirin 81 MG tablet Take 1 tablet (81 mg total) by mouth daily. 90 tablet 3  . atorvastatin (LIPITOR) 20 MG tablet Take 1 tablet (20 mg total) by mouth daily. 90 tablet 0  . Blood Glucose Monitoring Suppl (TRUE METRIX METER) DEVI 1 kit by Does  not apply route daily. Use as instructed to check blood sugar daily. 1 each 0  . glucose blood (TRUE METRIX BLOOD GLUCOSE TEST) test strip Use as instructed to check blood sugar daily. 100 each 11  . insulin aspart (NOVOLOG) 100 UNIT/ML injection Inject 6-10 Units into the skin 2 (two) times daily after a meal. Sliding scale (Patient taking differently: Inject 6-15 Units into the skin See admin instructions. Inject 6-15 units into the skin two times a day after meals, per sliding scale) 10 mL 3  . insulin glargine (LANTUS SOLOSTAR) 100 UNIT/ML Solostar Pen Inject 20 Units into the skin daily. (Patient taking differently: Inject 20 Units into the skin daily before breakfast. ) 5 pen 5  . omeprazole (PRILOSEC) 20 MG capsule Take 1 capsule (20 mg total) by mouth daily. 30 capsule 0  . potassium chloride SA (KLOR-CON) 10 MEQ tablet Take 2 tablets (20 mEq total) by mouth daily. (Patient taking differently:  Take 10 mEq by mouth daily. ) 90 tablet 0  . sucralfate (CARAFATE) 1 g tablet Take 1 tablet (1 g total) by mouth 4 (four) times daily -  with meals and at bedtime. 30 tablet 0  . TRUEplus Lancets 28G MISC Use as instructed to check blood sugar daily. 100 each 11  . glipiZIDE (GLUCOTROL) 10 MG tablet Take 1 tablet (10 mg total) by mouth 2 (two) times daily before a meal. 60 tablet 3  . hydrochlorothiazide (HYDRODIURIL) 25 MG tablet Take 1 tablet (25 mg total) by mouth daily. 30 tablet 5  . losartan (COZAAR) 100 MG tablet Take 0.5 tablets (50 mg total) by mouth daily. 30 tablet 5  . metFORMIN (GLUCOPHAGE) 1000 MG tablet Take 1 tablet (1,000 mg total) by mouth 2 (two) times daily with a meal. 60 tablet 3  . Melatonin 5 MG CAPS Take 1 capsule (5 mg total) by mouth at bedtime. (Patient not taking: Reported on 02/11/2020) 30 capsule 3   No facility-administered medications prior to visit.    Allergies  Allergen Reactions  . Lisinopril Swelling and Other (See Comments)    Lips and mouth became swollen  . Penicillins Itching and Rash    Did it involve swelling of the face/tongue/throat, SOB, or low BP? Yes Did it involve sudden or severe rash/hives, skin peeling, or any reaction on the inside of your mouth or nose? Yes Did you need to seek medical attention at a hospital or doctor's office? No When did it last happen?Teenager If all above answers are "NO", may proceed with cephalosporin use.     ROS Review of Systems  All other systems reviewed and are negative.     Objective:    Physical Exam Vitals reviewed.  Constitutional:      Appearance: He is obese.  HENT:     Head: Normocephalic.     Right Ear: Tympanic membrane normal.     Nose: Nose normal.  Cardiovascular:     Rate and Rhythm: Normal rate and regular rhythm.  Musculoskeletal:        General: Normal range of motion.     Cervical back: Normal range of motion and neck supple.  Skin:    General: Skin is warm and  dry.  Neurological:     Mental Status: He is alert and oriented to person, place, and time.  Psychiatric:        Mood and Affect: Mood normal.     BP 123/78 (BP Location: Left Arm, Patient Position: Sitting, Cuff Size: Normal)  Pulse 69   Temp 98.1 F (36.7 C) (Oral)   Ht '5\' 8"'  (1.727 m)   Wt 217 lb (98.4 kg)   SpO2 99%   BMI 32.99 kg/m  Wt Readings from Last 3 Encounters:  03/06/20 217 lb (98.4 kg)  02/21/20 202 lb (91.6 kg)  02/11/20 209 lb (94.8 kg)     Health Maintenance Due  Topic Date Due  . Hepatitis C Screening  Never done  . OPHTHALMOLOGY EXAM  Never done    There are no preventive care reminders to display for this patient.  No results found for: TSH Lab Results  Component Value Date   WBC 7.8 02/11/2020   HGB 14.8 02/11/2020   HCT 40.9 02/11/2020   MCV 84.9 02/11/2020   PLT 251 02/11/2020   Lab Results  Component Value Date   NA 134 (L) 02/11/2020   K 3.6 02/11/2020   CO2 23 02/11/2020   GLUCOSE 328 (H) 02/11/2020   BUN 8 02/11/2020   CREATININE 0.88 02/11/2020   BILITOT 0.9 02/11/2020   ALKPHOS 47 02/11/2020   AST 27 02/11/2020   ALT 35 02/11/2020   PROT 7.6 02/11/2020   ALBUMIN 4.0 02/11/2020   CALCIUM 9.1 02/11/2020   ANIONGAP 11 02/11/2020   Lab Results  Component Value Date   CHOL 153 05/10/2019   Lab Results  Component Value Date   HDL 44 05/10/2019   Lab Results  Component Value Date   LDLCALC 92 05/10/2019   Lab Results  Component Value Date   TRIG 89 05/10/2019   Lab Results  Component Value Date   CHOLHDL 3.5 05/10/2019   Lab Results  Component Value Date   HGBA1C 9.8 (A) 03/06/2020      Assessment & Plan:  Moishe was seen today for diabetes.  Diagnoses and all orders for this visit:  Type 2 diabetes mellitus with complication, without long-term current use of insulin ([place]) : Goal of therapy: Less than 6.5 hemoglobin A1c.Decrease  foods that are high in carbohydrates are the following rice, potatoes,  breads, sugars, and pastas.  Reduction in the intake (eating) will assist in lowering your blood sugars. -     Glucose (CBG) -     HgB A1c -     Ambulatory referral to Ophthalmology  Other orders/Medication Refill -     metFORMIN (GLUCOPHAGE) 1000 MG tablet; Take 1 tablet (1,000 mg total) by mouth 2 (two) times daily with a meal. -     glipiZIDE (GLUCOTROL) 10 MG tablet; Take 1 tablet (10 mg total) by mouth 2 (two) times daily before a meal. -     hydrochlorothiazide (HYDRODIURIL) 25 MG tablet; Take 1 tablet (25 mg total) by mouth daily. -     losartan (COZAAR) 100 MG tablet; Take 0.5 tablets (50 mg total) by mouth daily.    Follow-up: Return in about 3 months (around 06/06/2020) for DM/fasting labs.    Kerin Perna, NP

## 2020-03-06 NOTE — Telephone Encounter (Signed)
Copied from CRM [id number]. Topic: General - Inquiry >> Mar 06, 2020  3:54 PM Leary Roca wrote: Reason for CRM: Pt was told to call office back to let Gwinda Passe know what type of insulin he was taking . Insulin lispro kwikpen . Please advise

## 2020-03-06 NOTE — Telephone Encounter (Signed)
PCP is informed on what insulin was taking.

## 2020-03-06 NOTE — Patient Instructions (Signed)

## 2020-03-13 ENCOUNTER — Emergency Department (HOSPITAL_COMMUNITY): Admission: EM | Admit: 2020-03-13 | Discharge: 2020-03-13 | Discharge status: Against Medical Advice | Payer: Self-pay

## 2020-03-13 ENCOUNTER — Ambulatory Visit (INDEPENDENT_AMBULATORY_CARE_PROVIDER_SITE_OTHER): Payer: Self-pay | Admitting: *Deleted

## 2020-03-13 ENCOUNTER — Other Ambulatory Visit: Payer: Self-pay

## 2020-03-13 ENCOUNTER — Ambulatory Visit (HOSPITAL_COMMUNITY)
Admission: EM | Admit: 2020-03-13 | Discharge: 2020-03-13 | Disposition: A | Discharge status: Home / Self Care | Payer: Self-pay | Attending: Family Medicine | Admitting: Family Medicine

## 2020-03-13 ENCOUNTER — Encounter (HOSPITAL_COMMUNITY): Payer: Self-pay

## 2020-03-13 DIAGNOSIS — L089 Local infection of the skin and subcutaneous tissue, unspecified: Secondary | ICD-10-CM

## 2020-03-13 DIAGNOSIS — B9689 Other specified bacterial agents as the cause of diseases classified elsewhere: Secondary | ICD-10-CM

## 2020-03-13 LAB — CBG MONITORING, ED: Glucose-Capillary: 244 mg/dL — ABNORMAL HIGH (ref 70–99)

## 2020-03-13 MED ORDER — DOXYCYCLINE HYCLATE 100 MG PO CAPS
100.0000 mg | ORAL_CAPSULE | Freq: Two times a day (BID) | ORAL | 0 refills | Status: DC
Start: 2020-03-13 — End: 2020-06-19

## 2020-03-13 MED ORDER — BLOOD GLUCOSE MONITOR KIT: PACK | 0 refills | Status: AC

## 2020-03-13 NOTE — Telephone Encounter (Signed)
No BP medication today/yesterday.Patient states he does not have a monitor at home to check BP.Patient has had a gap between his payday and his medication refill. He states he is getting some of his medications today- but he wants to come to office for BP/glucose check. Patient has missed oral medications for diabetes-he states he is out of oral medication. Last checked at work yesterday lunch(used last strip)- 247.He has not taken his insulin because he is out of test strips. He is to pick up today. Advised patient the office does not do BP/glucose checks without appointment- advised UC because his levels are probable high without any medications on board. He states he will go. Reason for Disposition . [1] Caller has NON-URGENT medicine question about med that PCP prescribed AND [2] triager unable to answer question    Patient is calling to request nurse check his BP/glucose- he has been out of medications due to gap in payday and refills- unable to check levels- out of strips/no BP monitor at home. Call to office-they do not do BP/glucose checks with nurse. Advised UC to check these level since patient has not been taking medications.  Answer Assessment - Initial Assessment Questions 1. NAME of MEDICATION: "What medicine are you calling about?"     Patient has run out of medications before his payday and is requesting BP/glucose check. 2. QUESTION: "What is your question?" (e.g., medication refill, side effect)     Wants to come to office for nurse to check BP/glucose level 3. PRESCRIBING HCP: "Who prescribed it?" Reason: if prescribed by specialist, call should be referred to that group.     PCP 4. SYMPTOMS: "Do you have any symptoms?"     headache 5. SEVERITY: If symptoms are present, ask "Are they mild, moderate or severe?"     Mild/moderate- comes and goes 6. PREGNANCY:  "Is there any chance that you are pregnant?" "When was your last menstrual period?"     n/a  Answer Assessment - Initial  Assessment Questions 1. LOCATION: "Where does it hurt?"      sides 2. ONSET: "When did the headache start?" (Minutes, hours or days)      today 3. PATTERN: "Does the pain come and go, or has it been constant since it started?"     Comes and goes 4. SEVERITY: "How bad is the pain?" and "What does it keep you from doing?"  (e.g., Scale 1-10; mild, moderate, or severe)   - MILD (1-3): doesn't interfere with normal activities    - MODERATE (4-7): interferes with normal activities or awakens from sleep    - SEVERE (8-10): excruciating pain, unable to do any normal activities        mild 5. RECURRENT SYMPTOM: "Have you ever had headaches before?" If Yes, ask: "When was the last time?" and "What happened that time?"      Yes- patient states he has headache when he has elevated BP or glucose 6. CAUSE: "What do you think is causing the headache?"     Out of medications- gets paid today and will fill 7. MIGRAINE: "Have you been diagnosed with migraine headaches?" If Yes, ask: "Is this headache similar?"      no 8. HEAD INJURY: "Has there been any recent injury to the head?"      no 9. OTHER SYMPTOMS: "Do you have any other symptoms?" (fever, stiff neck, eye pain, sore throat, cold symptoms)     no 10. PREGNANCY: "Is there any chance you are  pregnant?" "When was your last menstrual period?"       n/a  Protocols used: MEDICATION QUESTION CALL-A-AH, HEADACHE-A-AH

## 2020-03-13 NOTE — ED Triage Notes (Addendum)
Pt presents with complaints of an abscess on the top right side of his buttocks x 4 days. Reports area is draining some on its own. Pt reports hx of same and needing antibiotics. States he has not checked his blood sugar since yesterday afternoon.

## 2020-03-13 NOTE — Telephone Encounter (Signed)
Patient exp. head aches unable to check BP or sugar level no other symptoms seeking clinical advice. Offered appointment no avail appts until 8/8 In office. (will send message to practice for a possible work in)

## 2020-03-16 NOTE — ED Provider Notes (Signed)
[place]   [id number] 03/13/20 Arrival Time: 5320  ASSESSMENT & PLAN:  1. Bacterial skin infection     Prefers trial of antibiotic vs attempt at I&D.  Meds ordered this encounter  Medications  . doxycycline (VIBRAMYCIN) 100 MG capsule    Sig: Take 1 capsule (100 mg total) by mouth 2 (two) times daily.    Dispense:  20 capsule    Refill:  0  . blood glucose meter kit and supplies KIT    Sig: Dispense based on patient and insurance preference. Use up to four times daily as directed. (FOR ICD-9 250.00, 250.01).    Dispense:  1 each    Refill:  0    Order Specific Question:   Number of strips    Answer:   100    Order Specific Question:   Number of lancets    Answer:   100     Finish all antibiotics. OTC analgesics as needed.  Reviewed expectations re: course of current medical issues. Questions answered. Outlined signs and symptoms indicating need for more acute intervention. Patient verbalized understanding. After Visit Summary given.   SUBJECTIVE:  Brandon Mcneil is a 39 y.o. male who presents with a possible infection of his upper R buttock. Onset gradual, approximately 2 days ago without active drainage and without active bleeding. Symptoms have stabilized since beginning. Fever: absent. OTC/home treatment: none. Would like a Rx for new blood glucometer    OBJECTIVE:  Vitals:   03/13/20 1727  BP: (!) 138/85  Pulse: 99  Resp: 18  Temp: 98.2 F (36.8 C)  TempSrc: Oral  SpO2: 100%     General appearance: alert; no distress Skin: approx 1 cm area of skin thickening over R upper buttock; tender to touch; no active drainage or bleeding Psychological: alert and cooperative; normal mood and affect  Allergies  Allergen Reactions  . Lisinopril Swelling and Other (See Comments)    Lips and mouth became swollen  . Penicillins Itching and Rash    Did it involve swelling of the face/tongue/throat, SOB, or low BP? Yes Did it involve sudden or  severe rash/hives, skin peeling, or any reaction on the inside of your mouth or nose? Yes Did you need to seek medical attention at a hospital or doctor's office? No When did it last happen?Teenager If all above answers are "NO", may proceed with cephalosporin use.     Past Medical History:  Diagnosis Date  . Depression   . Diabetes ([place])   . Essential hypertension 02/03/2017  . Hypercholesteremia   . Hypertension    Social History   Socioeconomic History  . Marital status: Single    Spouse name: Not on file  . Number of children: 3  . Years of education: 10th  . Highest education level: Not on file  Occupational History  . Occupation: unemployed  Tobacco Use  . Smoking status: Current Every Day Smoker    Packs/day: 0.50    Years: 14.00    Pack years: 7.00    Types: Cigarettes  . Smokeless tobacco: Never Used  . Tobacco comment: 1/2 pack a day  Substance and Sexual Activity  . Alcohol use: Yes    Alcohol/week: 2.0 standard drinks    Types: 2 Cans of beer per week    Comment: occasional  . Drug use: No    Types: Marijuana    Comment: MJ former user.  Last use in September 2017  . Sexual activity: Yes  Partners: Female    Birth control/protection: None  Other Topics Concern  . 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   Social Determinants of Health   Financial Resource Strain:   . Difficulty of Paying Living Expenses:   Food Insecurity:   . Worried About Charity fundraiser in the Last Year:   . Arboriculturist in the Last Year:   Transportation Needs:   . Film/video editor (Medical):   Marland Kitchen Lack of Transportation (Non-Medical):   Physical Activity:   . Days of Exercise per Week:   . Minutes of Exercise per Session:   Stress:   . Feeling of Stress :   Social Connections:   . Frequency of Communication with Friends and  Family:   . Frequency of Social Gatherings with Friends and Family:   . Attends Religious Services:   . Active Member of Clubs or Organizations:   . Attends Archivist Meetings:   Marland Kitchen Marital Status:    Family History  Problem Relation Age of Onset  . Healthy Mother   . Healthy Father    History reviewed. No pertinent surgical history.         Vanessa Kick, MD 03/16/20 1019

## 2020-04-30 ENCOUNTER — Encounter (HOSPITAL_COMMUNITY): Payer: Self-pay | Admitting: Emergency Medicine

## 2020-04-30 ENCOUNTER — Emergency Department (HOSPITAL_COMMUNITY)
Admission: EM | Admit: 2020-04-30 | Discharge: 2020-04-30 | Disposition: A | Discharge status: Home / Self Care | Payer: Self-pay | Attending: Emergency Medicine | Admitting: Emergency Medicine

## 2020-04-30 ENCOUNTER — Other Ambulatory Visit: Payer: Self-pay

## 2020-04-30 DIAGNOSIS — K0889 Other specified disorders of teeth and supporting structures: Secondary | ICD-10-CM | POA: Insufficient documentation

## 2020-04-30 DIAGNOSIS — E119 Type 2 diabetes mellitus without complications: Secondary | ICD-10-CM | POA: Insufficient documentation

## 2020-04-30 DIAGNOSIS — E1110 Type 2 diabetes mellitus with ketoacidosis without coma: Secondary | ICD-10-CM | POA: Insufficient documentation

## 2020-04-30 DIAGNOSIS — F17210 Nicotine dependence, cigarettes, uncomplicated: Secondary | ICD-10-CM | POA: Insufficient documentation

## 2020-04-30 DIAGNOSIS — J029 Acute pharyngitis, unspecified: Secondary | ICD-10-CM | POA: Insufficient documentation

## 2020-04-30 DIAGNOSIS — Z794 Long term (current) use of insulin: Secondary | ICD-10-CM | POA: Insufficient documentation

## 2020-04-30 DIAGNOSIS — I10 Essential (primary) hypertension: Secondary | ICD-10-CM | POA: Insufficient documentation

## 2020-04-30 DIAGNOSIS — Z7982 Long term (current) use of aspirin: Secondary | ICD-10-CM | POA: Insufficient documentation

## 2020-04-30 MED ORDER — CLINDAMYCIN HCL 150 MG PO CAPS: 300.0000 mg | ORAL_CAPSULE | Freq: Three times a day (TID) | ORAL | 0 refills | Status: DC

## 2020-04-30 MED ORDER — CLINDAMYCIN HCL 300 MG PO CAPS
300.0000 mg | ORAL_CAPSULE | Freq: Once | ORAL | Status: AC
  Administered 2020-04-30: 300 mg via ORAL
  Filled 2020-04-30: qty 1

## 2020-04-30 NOTE — ED Provider Notes (Signed)
[facility] DEPT Provider Note   CSN: [id number] Arrival date & time: 04/30/20  2316     History   Chief Complaint Chief Complaint  Patient presents with  . Dental Pain    HPI Brandon Mcneil is a 39 y.o. male.  Patient presents to the emergency department with a dental complaint. Symptoms began a couple of days ago. The patient has tried to alleviate pain with OTC excedrin.  Pain rated as severe, characterized as throbbing in nature and located left rear lower tooth.  Patient also reports sore throat that started yesterday. Patient denies fever, night sweats, chills, opening mouth, SOB, nuchal rigidity or decreased ROM of neck.  Patient does not have a dentist and requests a resource guide at discharge.      HPI  Past Medical History:  Diagnosis Date  . Depression   . Diabetes ([place])   . Essential hypertension 02/03/2017  . Hypercholesteremia   . Hypertension     Patient Active Problem List   Diagnosis Date Noted  . Stress 02/08/2017  . Essential hypertension 02/03/2017  . Type 2 diabetes mellitus with complication, without long-term current use of insulin ([place]) 02/03/2017  . Medication overdose 10/31/2013  . Bipolar I disorder, most recent episode depressed ([place]) 09/11/2013  . Tobacco use disorder 09/11/2013  . Newly diagnosed diabetes ([place]) 09/11/2013  . Dehydration 09/11/2013  . Pain, dental 09/11/2013  . DKA (diabetic ketoacidoses) ([place]) 09/10/2013    History reviewed. No pertinent surgical history.      Home Medications    Prior to Admission medications   Medication Sig Start Date End Date Taking? Authorizing Provider  amLODipine (NORVASC) 10 MG tablet Take 1 tablet (10 mg total) by mouth daily. 11/30/19   Kerin Perna, NP  aspirin 81 MG tablet Take 1 tablet (81 mg total) by mouth daily. 02/03/17   Mack Hook, MD  atorvastatin (LIPITOR) 20 MG tablet Take 1 tablet (20 mg total) by mouth daily. 11/30/19    Kerin Perna, NP  blood glucose meter kit and supplies KIT Dispense based on patient and insurance preference. Use up to four times daily as directed. (FOR ICD-9 250.00, 250.01). 03/13/20   Vanessa Kick, MD  Blood Glucose Monitoring Suppl (TRUE METRIX METER) DEVI 1 kit by Does not apply route daily. Use as instructed to check blood sugar daily. 08/09/19   Charlott Rakes, MD  clindamycin (CLEOCIN) 150 MG capsule Take 2 capsules (300 mg total) by mouth 3 (three) times daily. May dispense as 19m capsules 04/30/20   BMontine Circle PA-C  doxycycline (VIBRAMYCIN) 100 MG capsule Take 1 capsule (100 mg total) by mouth 2 (two) times daily. 03/13/20   HVanessa Kick MD  glipiZIDE (GLUCOTROL) 10 MG tablet Take 1 tablet (10 mg total) by mouth 2 (two) times daily before a meal. 03/06/20   EKerin Perna NP  glucose blood (TRUE METRIX BLOOD GLUCOSE TEST) test strip Use as instructed to check blood sugar daily. 08/09/19   NCharlott Rakes MD  hydrochlorothiazide (HYDRODIURIL) 25 MG tablet Take 1 tablet (25 mg total) by mouth daily. 03/06/20   EKerin Perna NP  insulin aspart (NOVOLOG) 100 UNIT/ML injection Inject 6-10 Units into the skin 2 (two) times daily after a meal. Sliding scale Patient taking differently: Inject 6-15 Units into the skin See admin instructions. Inject 6-15 units into the skin two times a day after meals, per sliding scale 11/30/19   EKerin Perna NP  insulin glargine (LANTUS SOLOSTAR)  100 UNIT/ML Solostar Pen Inject 20 Units into the skin daily. Patient taking differently: Inject 20 Units into the skin daily before breakfast.  11/30/19   Kerin Perna, NP  losartan (COZAAR) 100 MG tablet Take 0.5 tablets (50 mg total) by mouth daily. 03/06/20   Kerin Perna, NP  Melatonin 5 MG CAPS Take 1 capsule (5 mg total) by mouth at bedtime. Patient not taking: Reported on 02/11/2020 05/10/19   Kerin Perna, NP  metFORMIN (GLUCOPHAGE) 1000 MG tablet Take 1  tablet (1,000 mg total) by mouth 2 (two) times daily with a meal. 03/06/20   Kerin Perna, NP  omeprazole (PRILOSEC) 20 MG capsule Take 1 capsule (20 mg total) by mouth daily. 02/11/20   Tegeler, Gwenyth Allegra, MD  potassium chloride SA (KLOR-CON) 10 MEQ tablet Take 2 tablets (20 mEq total) by mouth daily. Patient taking differently: Take 10 mEq by mouth daily.  11/30/19   Kerin Perna, NP  sucralfate (CARAFATE) 1 g tablet Take 1 tablet (1 g total) by mouth 4 (four) times daily -  with meals and at bedtime. 02/11/20   Tegeler, Gwenyth Allegra, MD  TRUEplus Lancets 28G MISC Use as instructed to check blood sugar daily. 08/09/19   Charlott Rakes, MD  losartan-hydrochlorothiazide (HYZAAR) 100-25 MG tablet Take 1 tablet by mouth daily. 05/10/19 07/07/19  Kerin Perna, NP    Family History Family History  Problem Relation Age of Onset  . Healthy Mother   . Healthy Father     Social History Social History   Tobacco Use  . Smoking status: Current Every Day Smoker    Packs/day: 0.50    Years: 14.00    Pack years: 7.00    Types: Cigarettes  . Smokeless tobacco: Never Used  . Tobacco comment: 1/2 pack a day  Substance Use Topics  . Alcohol use: Yes    Alcohol/week: 2.0 standard drinks    Types: 2 Cans of beer per week    Comment: occasional  . Drug use: No    Types: Marijuana    Comment: MJ former user.  Last use in September 2017     Allergies   Lisinopril and Penicillins   Review of Systems Review of Systems  Constitutional: Negative for chills and fever.  HENT: Positive for dental problem and sore throat. Negative for drooling.   Neurological: Negative for speech difficulty.  Psychiatric/Behavioral: Positive for sleep disturbance.     Physical Exam Updated Vital Signs BP (!) 164/100 (BP Location: Right Arm)   Pulse 71   Temp 98.6 F (37 C) (Oral)   Resp 15   Ht '5\' 8"'  (1.727 m)   Wt 113.4 kg   SpO2 100%   BMI 38.01 kg/m   Physical Exam Physical  Exam  Constitutional: Pt appears well-developed and well-nourished.  HENT:  Head: Normocephalic.  Right Ear: Tympanic membrane, external ear and ear canal normal.  Left Ear: Tympanic membrane, external ear and ear canal normal.  Nose: Nose normal. Right sinus exhibits no maxillary sinus tenderness and no frontal sinus tenderness. Left sinus exhibits no maxillary sinus tenderness and no frontal sinus tenderness.  Mouth/Throat: Uvula is midline, oropharynx is clear and moist and mucous membranes are normal. No oral lesions. No uvula swelling or lacerations. No oropharyngeal exudate, posterior oropharyngeal edema, posterior oropharyngeal erythema or tonsillar abscesses.  Poor dentition No gingival swelling, fluctuance or induration No gross abscess  No sublingual edema, tenderness to palpation, or sign of Ludwig's angina, or deep  space infection Pain at tooth #18 Oropharynx is moderately erythematous, but no exudate, no evidence of peritonsillar abscess Eyes: Conjunctivae are normal. Pupils are equal, round, and reactive to light. Right eye exhibits no discharge. Left eye exhibits no discharge.  Neck: Normal range of motion. Neck supple.  No stridor Handling secretions without difficulty No nuchal rigidity No cervical lymphadenopathy Cardiovascular: Normal rate, regular rhythm and normal heart sounds.   Pulmonary/Chest: Effort normal. No respiratory distress.  Equal chest rise  Abdominal: Soft. Bowel sounds are normal. Pt exhibits no distension. There is no tenderness.  Lymphadenopathy: Pt has no cervical adenopathy.  Neurological: Pt is alert and oriented x 4  Skin: Skin is warm and dry.  Psychiatric: Pt has a normal mood and affect.  Nursing note and vitals reviewed.   ED Treatments / Results  Labs (all labs ordered are listed, but only abnormal results are displayed) Labs Reviewed - No data to display  EKG    Radiology No results found.  Procedures Procedures (including  critical care time)  Medications Ordered in ED Medications  clindamycin (CLEOCIN) capsule 300 mg (has no administration in time range)     Initial Impression / Assessment and Plan / ED Course  I have reviewed the triage vital signs and the nursing notes.  Pertinent labs & imaging results that were available during my care of the patient were reviewed by me and considered in my medical decision making (see chart for details).        Patient with dentalgia.  No abscess requiring immediate incision and drainage.  Exam not concerning for Ludwig's angina or pharyngeal abscess.  Will treat with clinda, he's allergic to penicillin. Pt instructed to follow-up with dentist.  Discussed return precautions. Pt safe for discharge.   Final Clinical Impressions(s) / ED Diagnoses   Final diagnoses:  Pain, dental  Sore throat    ED Discharge Orders         Ordered    clindamycin (CLEOCIN) 150 MG capsule  3 times daily        04/30/20 2332           Montine Circle, PA-C 04/30/20 2336    Horton, Barbette Hair, MD 05/01/20 [phone]

## 2020-04-30 NOTE — ED Triage Notes (Signed)
Patient is complaining of dental pain, tooth pain, and not being able to eat. Patient brought in by [facility]. Patient states he can not afford the dentist.

## 2020-06-06 ENCOUNTER — Ambulatory Visit (INDEPENDENT_AMBULATORY_CARE_PROVIDER_SITE_OTHER): Payer: Self-pay | Admitting: Primary Care

## 2020-06-19 ENCOUNTER — Other Ambulatory Visit: Payer: Self-pay

## 2020-06-19 ENCOUNTER — Ambulatory Visit (INDEPENDENT_AMBULATORY_CARE_PROVIDER_SITE_OTHER): Payer: Self-pay | Admitting: Primary Care

## 2020-06-19 ENCOUNTER — Encounter (INDEPENDENT_AMBULATORY_CARE_PROVIDER_SITE_OTHER): Payer: Self-pay | Admitting: Primary Care

## 2020-06-19 VITALS — BP 138/93 | HR 99 | Temp 97.2°F | Ht 68.0 in | Wt 218.8 lb

## 2020-06-19 DIAGNOSIS — F17200 Nicotine dependence, unspecified, uncomplicated: Secondary | ICD-10-CM

## 2020-06-19 DIAGNOSIS — I10 Essential (primary) hypertension: Secondary | ICD-10-CM

## 2020-06-19 DIAGNOSIS — E118 Type 2 diabetes mellitus with unspecified complications: Secondary | ICD-10-CM

## 2020-06-19 DIAGNOSIS — E782 Mixed hyperlipidemia: Secondary | ICD-10-CM

## 2020-06-19 DIAGNOSIS — Z23 Encounter for immunization: Secondary | ICD-10-CM

## 2020-06-19 DIAGNOSIS — Z125 Encounter for screening for malignant neoplasm of prostate: Secondary | ICD-10-CM

## 2020-06-19 DIAGNOSIS — Z760 Encounter for issue of repeat prescription: Secondary | ICD-10-CM

## 2020-06-19 LAB — GLUCOSE, POCT (MANUAL RESULT ENTRY): POC Glucose: 276 mg/dl — AB (ref 70–99)

## 2020-06-19 LAB — POCT GLYCOSYLATED HEMOGLOBIN (HGB A1C): Hemoglobin A1C: 8.7 % — AB (ref 4.0–5.6)

## 2020-06-19 MED ORDER — ATORVASTATIN CALCIUM 20 MG PO TABS: 20.0000 mg | ORAL_TABLET | Freq: Every day | ORAL | 1 refills | Status: DC

## 2020-06-19 MED ORDER — LANTUS SOLOSTAR 100 UNIT/ML ~~LOC~~ SOPN: 20.0000 [IU] | PEN_INJECTOR | Freq: Every day | SUBCUTANEOUS | 6 refills | Status: DC

## 2020-06-19 MED ORDER — AMLODIPINE BESYLATE 10 MG PO TABS: 10.0000 mg | ORAL_TABLET | Freq: Every day | ORAL | 1 refills | Status: DC

## 2020-06-19 MED ORDER — LOSARTAN POTASSIUM 100 MG PO TABS: 50.0000 mg | ORAL_TABLET | Freq: Every day | ORAL | 1 refills | Status: DC

## 2020-06-19 MED ORDER — GLIPIZIDE 10 MG PO TABS: 10.0000 mg | ORAL_TABLET | Freq: Two times a day (BID) | ORAL | 1 refills | Status: DC

## 2020-06-19 NOTE — Patient Instructions (Signed)

## 2020-06-19 NOTE — Progress Notes (Signed)
Established Patient Office Visit  Subjective:  Patient ID: Brandon Mcneil, male    DOB: [birth year]  Age: 39 y.o. MRN: [id number]  CC:  Chief Complaint  Patient presents with  . Diabetes    HPI [name] is a 39 year old obese male presents for management of hypertension, -Denies shortness of breath, headaches, chest pain or lower extremity edema. Diabetes has decrease slightly- denies polyuria, polydipsia and polyphagia. Complains of dry coughs lasted 1 mouth caused him to decrease smoking.  Past Medical History:  Diagnosis Date  . Depression   . Diabetes ([place])   . Essential hypertension 02/03/2017  . Hypercholesteremia   . Hypertension     History reviewed. No pertinent surgical history.  Family History  Problem Relation Age of Onset  . Healthy Mother   . Healthy Father     Social History   Socioeconomic History  . Marital status: Single    Spouse name: Not on file  . Number of children: 3  . Years of education: 10th  . Highest education level: Not on file  Occupational History  . Occupation: unemployed  Tobacco Use  . Smoking status: Current Every Day Smoker    Packs/day: 0.50    Years: 14.00    Pack years: 7.00    Types: Cigarettes  . Smokeless tobacco: Never Used  . Tobacco comment: 1/2 pack a day  Substance and Sexual Activity  . Alcohol use: Yes    Alcohol/week: 2.0 standard drinks    Types: 2 Cans of beer per week    Comment: occasional  . Drug use: No    Types: Marijuana    Comment: MJ former user.  Last use in September 2017  . Sexual activity: Yes    Partners: Female    Birth control/protection: None  Other Topics Concern  . 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   Social Determinants of Health   Financial Resource Strain:   . Difficulty of Paying Living Expenses: Not on file  Food Insecurity:    . Worried About Charity fundraiser in the Last Year: Not on file  . Ran Out of Food in the Last Year: Not on file  Transportation Needs:   . Lack of Transportation (Medical): Not on file  . Lack of Transportation (Non-Medical): Not on file  Physical Activity:   . Days of Exercise per Week: Not on file  . Minutes of Exercise per Session: Not on file  Stress:   . Feeling of Stress : Not on file  Social Connections:   . Frequency of Communication with Friends and Family: Not on file  . Frequency of Social Gatherings with Friends and Family: Not on file  . Attends Religious Services: Not on file  . Active Member of Clubs or Organizations: Not on file  . Attends Archivist Meetings: Not on file  . Marital Status: Not on file  Intimate Partner Violence:   . Fear of Current or Ex-Partner: Not on file  . Emotionally Abused: Not on file  . Physically Abused: Not on file  . Sexually Abused: Not on file    Outpatient Medications Prior to Visit  Medication Sig Dispense Refill  . insulin aspart (NOVOLOG) 100 UNIT/ML injection Inject 6-10 Units into the skin 2 (two) times daily after a  meal. Sliding scale (Patient taking differently: Inject 6-15 Units into the skin See admin instructions. Inject 6-15 units into the skin two times a day after meals, per sliding scale) 10 mL 3  . metFORMIN (GLUCOPHAGE) 1000 MG tablet Take 1 tablet (1,000 mg total) by mouth 2 (two) times daily with a meal. 180 tablet 1  . glipiZIDE (GLUCOTROL) 10 MG tablet Take 1 tablet (10 mg total) by mouth 2 (two) times daily before a meal. 180 tablet 1  . insulin glargine (LANTUS SOLOSTAR) 100 UNIT/ML Solostar Pen Inject 20 Units into the skin daily. (Patient taking differently: Inject 20 Units into the skin daily before breakfast. ) 5 pen 5  . blood glucose meter kit and supplies KIT Dispense based on patient and insurance preference. Use up to four times daily as directed. (FOR ICD-9 250.00, 250.01). 1 each 0  .  Blood Glucose Monitoring Suppl (TRUE METRIX METER) DEVI 1 kit by Does not apply route daily. Use as instructed to check blood sugar daily. 1 each 0  . glucose blood (TRUE METRIX BLOOD GLUCOSE TEST) test strip Use as instructed to check blood sugar daily. 100 each 11  . omeprazole (PRILOSEC) 20 MG capsule Take 1 capsule (20 mg total) by mouth daily. 30 capsule 0  . TRUEplus Lancets 28G MISC Use as instructed to check blood sugar daily. 100 each 11  . amLODipine (NORVASC) 10 MG tablet Take 1 tablet (10 mg total) by mouth daily. (Patient not taking: Reported on 06/19/2020) 90 tablet 1  . aspirin 81 MG tablet Take 1 tablet (81 mg total) by mouth daily. 90 tablet 3  . atorvastatin (LIPITOR) 20 MG tablet Take 1 tablet (20 mg total) by mouth daily. (Patient not taking: Reported on 06/19/2020) 90 tablet 0  . clindamycin (CLEOCIN) 150 MG capsule Take 2 capsules (300 mg total) by mouth 3 (three) times daily. May dispense as 161m capsules 60 capsule 0  . doxycycline (VIBRAMYCIN) 100 MG capsule Take 1 capsule (100 mg total) by mouth 2 (two) times daily. 20 capsule 0  . hydrochlorothiazide (HYDRODIURIL) 25 MG tablet Take 1 tablet (25 mg total) by mouth daily. (Patient not taking: Reported on 06/19/2020) 90 tablet 1  . losartan (COZAAR) 100 MG tablet Take 0.5 tablets (50 mg total) by mouth daily. (Patient not taking: Reported on 06/19/2020) 90 tablet 1  . Melatonin 5 MG CAPS Take 1 capsule (5 mg total) by mouth at bedtime. (Patient not taking: Reported on 02/11/2020) 30 capsule 3  . potassium chloride SA (KLOR-CON) 10 MEQ tablet Take 2 tablets (20 mEq total) by mouth daily. (Patient taking differently: Take 10 mEq by mouth daily. ) 90 tablet 0  . sucralfate (CARAFATE) 1 g tablet Take 1 tablet (1 g total) by mouth 4 (four) times daily -  with meals and at bedtime. 30 tablet 0   No facility-administered medications prior to visit.    Allergies  Allergen Reactions  . Lisinopril Swelling and Other (See Comments)     Lips and mouth became swollen  . Penicillins Itching and Rash    Did it involve swelling of the face/tongue/throat, SOB, or low BP? Yes Did it involve sudden or severe rash/hives, skin peeling, or any reaction on the inside of your mouth or nose? Yes Did you need to seek medical attention at a hospital or doctor's office? No When did it last happen?Teenager If all above answers are "NO", may proceed with cephalosporin use.     ROS Review of Systems  All other systems reviewed and are negative.     Objective:    Physical Exam Vitals reviewed.  Constitutional:      Appearance: He is obese.  HENT:     Head: Normocephalic.     Right Ear: Tympanic membrane normal.     Left Ear: Tympanic membrane normal.     Nose: Nose normal.  Eyes:     Extraocular Movements: Extraocular movements intact.     Pupils: Pupils are equal, round, and reactive to light.  Cardiovascular:     Rate and Rhythm: Normal rate and regular rhythm.  Pulmonary:     Effort: Pulmonary effort is normal.     Breath sounds: Normal breath sounds.  Abdominal:     General: Bowel sounds are normal. There is distension.     Palpations: Abdomen is soft.  Musculoskeletal:        General: Normal range of motion.     Cervical back: Normal range of motion and neck supple.  Skin:    General: Skin is warm and dry.  Neurological:     Mental Status: He is alert and oriented to person, place, and time.  Psychiatric:        Mood and Affect: Mood normal.        Behavior: Behavior normal.        Thought Content: Thought content normal.        Judgment: Judgment normal.     BP (!) 138/93 (BP Location: Right Arm, Patient Position: Sitting, Cuff Size: Large)   Pulse 99   Temp (!) 97.2 F (36.2 C) (Temporal)   Ht '5\' 8"'  (1.727 m)   Wt 218 lb 12.8 oz (99.2 kg)   SpO2 95%   BMI 33.27 kg/m  Wt Readings from Last 3 Encounters:  06/19/20 218 lb 12.8 oz (99.2 kg)  04/30/20 250 lb (113.4 kg)  03/06/20 217 lb (98.4 kg)      Health Maintenance Due  Topic Date Due  . OPHTHALMOLOGY EXAM  Never done  . COVID-19 Vaccine (1) Never done    There are no preventive care reminders to display for this patient.  No results found for: TSH Lab Results  Component Value Date   WBC 5.0 06/19/2020   HGB 15.3 06/19/2020   HCT 43.6 06/19/2020   MCV 91 06/19/2020   PLT 255 06/19/2020   Lab Results  Component Value Date   NA 138 06/19/2020   K 4.4 06/19/2020   CO2 22 06/19/2020   GLUCOSE 244 (H) 06/19/2020   BUN 11 06/19/2020   CREATININE 0.93 06/19/2020   BILITOT 0.4 06/19/2020   ALKPHOS 100 06/19/2020   AST 16 06/19/2020   ALT 28 06/19/2020   PROT 7.3 06/19/2020   ALBUMIN 4.4 06/19/2020   CALCIUM 9.3 06/19/2020   ANIONGAP 11 02/11/2020   Lab Results  Component Value Date   CHOL 146 06/19/2020   Lab Results  Component Value Date   HDL 60 06/19/2020   Lab Results  Component Value Date   LDLCALC 61 06/19/2020   Lab Results  Component Value Date   TRIG 148 06/19/2020   Lab Results  Component Value Date   CHOLHDL 2.4 06/19/2020   Lab Results  Component Value Date   HGBA1C 8.7 (A) 06/19/2020      Assessment & Plan:  Kellen was seen today for diabetes.  Diagnoses and all orders for this visit:  Type 2 diabetes mellitus with complication, without long-term current use of insulin ([place]) ADA  recommends the following therapeutic goals for glycemic control related to A1c measurements: Less than 6.5 hemoglobin A1c. Monitor foods that are high in carbohydrates are the following rice, potatoes, breads, sugars, and pastas.  Reduction in the intake (eating) will assist in lowering your blood sugars. Added glucotrol 38m bid -     HgB A1c 8.7 uncontrol  Down from 9.8 3 months ago  -     Glucose (CBG) -     glipiZIDE (GLUCOTROL) 10 MG tablet; Take 1 tablet (10 mg total) by mouth 2 (two) times daily before a meal. -     insulin glargine (LANTUS SOLOSTAR) 100 UNIT/ML Solostar Pen; Inject 20 Units  into the skin daily. -     CBC with Differential  Essential hypertension, benign Counseled on blood pressure goal of less than 130/80, low-sodium, DASH diet, medication compliance, 150 minutes of moderate intensity exercise per week. Discussed medication compliance, adverse effects. -     losartan (COZAAR) 100 MG tablet; Take 0.5 tablets (50 mg total) by mouth daily. -     amLODipine (NORVASC) 10 MG tablet; Take 1 tablet (10 mg total) by mouth daily. -     CMP14+EGFR  Medication refill -     glipiZIDE (GLUCOTROL) 10 MG tablet; Take 1 tablet (10 mg total) by mouth 2 (two) times daily before a meal. -     losartan (COZAAR) 100 MG tablet; Take 0.5 tablets (50 mg total) by mouth daily. -     insulin glargine (LANTUS SOLOSTAR) 100 UNIT/ML Solostar Pen; Inject 20 Units into the skin daily. -     atorvastatin (LIPITOR) 20 MG tablet; Take 1 tablet (20 mg total) by mouth daily. -     amLODipine (NORVASC) 10 MG tablet; Take 1 tablet (10 mg total) by mouth daily.  Mixed hyperlipidemia -     atorvastatin (LIPITOR) 20 MG tablet; Take 1 tablet (20 mg total) by mouth daily. -     Lipid Panel  Tobacco dependence He is aware of increased risk for lung cancer and other respiratory diseases , CVD- recommend cessation.  This will be reminded at each clinical visit.  Prostate cancer screening -     PSA  Need for immunization against influenza Completed    Meds ordered this encounter  Medications  . glipiZIDE (GLUCOTROL) 10 MG tablet    Sig: Take 1 tablet (10 mg total) by mouth 2 (two) times daily before a meal.    Dispense:  180 tablet    Refill:  1  . losartan (COZAAR) 100 MG tablet    Sig: Take 0.5 tablets (50 mg total) by mouth daily.    Dispense:  90 tablet    Refill:  1  . insulin glargine (LANTUS SOLOSTAR) 100 UNIT/ML Solostar Pen    Sig: Inject 20 Units into the skin daily.    Dispense:  3 mL    Refill:  6  . atorvastatin (LIPITOR) 20 MG tablet    Sig: Take 1 tablet (20 mg total) by  mouth daily.    Dispense:  90 tablet    Refill:  1  . amLODipine (NORVASC) 10 MG tablet    Sig: Take 1 tablet (10 mg total) by mouth daily.    Dispense:  90 tablet    Refill:  1    Follow-up: Return in about 3 months (around 09/19/2020) for DM/HTN.    MKerin Perna NP

## 2020-06-20 LAB — CBC WITH DIFFERENTIAL/PLATELET
Basophils Absolute: 0 10*3/uL (ref 0.0–0.2)
Basos: 0 %
EOS (ABSOLUTE): 0.1 10*3/uL (ref 0.0–0.4)
Eos: 2 %
Hematocrit: 43.6 % (ref 37.5–51.0)
Hemoglobin: 15.3 g/dL (ref 13.0–17.7)
Immature Grans (Abs): 0 10*3/uL (ref 0.0–0.1)
Immature Granulocytes: 0 %
Lymphocytes Absolute: 2.2 10*3/uL (ref 0.7–3.1)
Lymphs: 45 %
MCH: 31.9 pg (ref 26.6–33.0)
MCHC: 35.1 g/dL (ref 31.5–35.7)
MCV: 91 fL (ref 79–97)
Monocytes Absolute: 0.6 10*3/uL (ref 0.1–0.9)
Monocytes: 12 %
Neutrophils Absolute: 2.1 10*3/uL (ref 1.4–7.0)
Neutrophils: 41 %
Platelets: 255 10*3/uL (ref 150–450)
RBC: 4.79 x10E6/uL (ref 4.14–5.80)
RDW: 13.6 % (ref 11.6–15.4)
WBC: 5 10*3/uL (ref 3.4–10.8)

## 2020-06-20 LAB — CMP14+EGFR
ALT: 28 IU/L (ref 0–44)
AST: 16 IU/L (ref 0–40)
Albumin/Globulin Ratio: 1.5 (ref 1.2–2.2)
Albumin: 4.4 g/dL (ref 4.0–5.0)
Alkaline Phosphatase: 100 IU/L (ref 44–121)
BUN/Creatinine Ratio: 12 (ref 9–20)
BUN: 11 mg/dL (ref 6–24)
Bilirubin Total: 0.4 mg/dL (ref 0.0–1.2)
CO2: 22 mmol/L (ref 20–29)
Calcium: 9.3 mg/dL (ref 8.7–10.2)
Chloride: 101 mmol/L (ref 96–106)
Creatinine, Ser: 0.93 mg/dL (ref 0.76–1.27)
GFR calc Af Amer: 117 mL/min/{1.73_m2} (ref >59)
GFR calc non Af Amer: 102 mL/min/{1.73_m2} (ref >59)
Globulin, Total: 2.9 g/dL (ref 1.5–4.5)
Glucose: 244 mg/dL — ABNORMAL HIGH (ref 65–99)
Potassium: 4.4 mmol/L (ref 3.5–5.2)
Sodium: 138 mmol/L (ref 134–144)
Total Protein: 7.3 g/dL (ref 6.0–8.5)

## 2020-06-20 LAB — PSA: Prostate Specific Ag, Serum: 0.6 ng/mL (ref 0.0–4.0)

## 2020-06-20 LAB — LIPID PANEL
Chol/HDL Ratio: 2.4 ratio (ref 0.0–5.0)
Cholesterol, Total: 146 mg/dL (ref 100–199)
HDL: 60 mg/dL (ref >39)
LDL Chol Calc (NIH): 61 mg/dL (ref 0–99)
Triglycerides: 148 mg/dL (ref 0–149)
VLDL Cholesterol Cal: 25 mg/dL (ref 5–40)

## 2020-06-21 ENCOUNTER — Encounter (INDEPENDENT_AMBULATORY_CARE_PROVIDER_SITE_OTHER): Payer: Self-pay | Admitting: Primary Care

## 2020-07-29 ENCOUNTER — Encounter (HOSPITAL_COMMUNITY): Payer: Self-pay

## 2020-07-29 ENCOUNTER — Ambulatory Visit (HOSPITAL_COMMUNITY)
Admission: EM | Admit: 2020-07-29 | Discharge: 2020-07-29 | Disposition: A | Discharge status: Home / Self Care | Payer: Self-pay | Attending: Emergency Medicine | Admitting: Emergency Medicine

## 2020-07-29 ENCOUNTER — Other Ambulatory Visit: Payer: Self-pay

## 2020-07-29 DIAGNOSIS — N390 Urinary tract infection, site not specified: Secondary | ICD-10-CM | POA: Insufficient documentation

## 2020-07-29 DIAGNOSIS — Z794 Long term (current) use of insulin: Secondary | ICD-10-CM | POA: Insufficient documentation

## 2020-07-29 DIAGNOSIS — Z9114 Patient's other noncompliance with medication regimen: Secondary | ICD-10-CM | POA: Insufficient documentation

## 2020-07-29 DIAGNOSIS — R319 Hematuria, unspecified: Secondary | ICD-10-CM | POA: Insufficient documentation

## 2020-07-29 DIAGNOSIS — E1165 Type 2 diabetes mellitus with hyperglycemia: Secondary | ICD-10-CM | POA: Insufficient documentation

## 2020-07-29 DIAGNOSIS — R81 Glycosuria: Secondary | ICD-10-CM | POA: Insufficient documentation

## 2020-07-29 LAB — POCT URINALYSIS DIPSTICK, ED / UC
Bilirubin Urine: NEGATIVE
Glucose, UA: >=1000 mg/dL — AB
Ketones, ur: 40 mg/dL — AB
Nitrite: POSITIVE — AB
Protein, ur: >=300 mg/dL — AB
Specific Gravity, Urine: >=1.03 (ref 1.005–1.030)
Urobilinogen, UA: 2 mg/dL — ABNORMAL HIGH (ref 0.0–1.0)
pH: 6 (ref 5.0–8.0)

## 2020-07-29 LAB — CBG MONITORING, ED: Glucose-Capillary: 233 mg/dL — ABNORMAL HIGH (ref 70–99)

## 2020-07-29 MED ORDER — NITROFURANTOIN MONOHYD MACRO 100 MG PO CAPS: 100.0000 mg | ORAL_CAPSULE | Freq: Two times a day (BID) | ORAL | 0 refills | Status: DC

## 2020-07-29 NOTE — Discharge Instructions (Addendum)
Take the antibiotic as directed.  The urine culture is pending.  We will call you if it shows the need to change or discontinue your antibiotic.    Schedule the soonest available appointment with your primary care provider to discuss your high blood sugar and sugar in your urine.

## 2020-07-29 NOTE — ED Triage Notes (Signed)
Pt presents with blood in urine X 2 days and difficulty urinating.

## 2020-07-29 NOTE — ED Provider Notes (Signed)
[facility] [place]    CSN: [id number] Arrival date & time: 07/29/20  1531      History   Chief Complaint Chief Complaint  Patient presents with  . Blood in Urine    HPI Brandon Mcneil is a 39 y.o. male.   Patient presents with 2-day history of hematuria and dysuria.  He also reports suprapubic pressure with urination and low back pain.  He denies fever, chills, pain, diarrhea, penile discharge, testicular pain, or other symptoms.  Patient also reports he has not been taking his Lantus because he cannot afford it but has been using his aunt's insulin instead; he has a picture of the aunt's insulin which appears to be Humalog KwikPen.  His medical history includes diabetes, DKA, hypertension, tobacco use disorder, bipolar 1 disorder, depression.  The history is provided by the patient and medical records.    Past Medical History:  Diagnosis Date  . Depression   . Diabetes ([place])   . Essential hypertension 02/03/2017  . Hypercholesteremia   . Hypertension     Patient Active Problem List   Diagnosis Date Noted  . Stress 02/08/2017  . Essential hypertension 02/03/2017  . Type 2 diabetes mellitus with complication, without long-term current use of insulin ([place]) 02/03/2017  . Medication overdose 10/31/2013  . Bipolar I disorder, most recent episode depressed ([place]) 09/11/2013  . Tobacco use disorder 09/11/2013  . Newly diagnosed diabetes ([place]) 09/11/2013  . Dehydration 09/11/2013  . Pain, dental 09/11/2013  . DKA (diabetic ketoacidoses) 09/10/2013    History reviewed. No pertinent surgical history.     Home Medications    Prior to Admission medications   Medication Sig Start Date End Date Taking? Authorizing Provider  amLODipine (NORVASC) 10 MG tablet Take 1 tablet (10 mg total) by mouth daily. 06/19/20   Kerin Perna, NP  atorvastatin (LIPITOR) 20 MG tablet Take 1 tablet (20 mg total) by mouth daily. 06/19/20   Kerin Perna, NP  blood glucose meter  kit and supplies KIT Dispense based on patient and insurance preference. Use up to four times daily as directed. (FOR ICD-9 250.00, 250.01). 03/13/20   Vanessa Kick, MD  Blood Glucose Monitoring Suppl (TRUE METRIX METER) DEVI 1 kit by Does not apply route daily. Use as instructed to check blood sugar daily. 08/09/19   Charlott Rakes, MD  glipiZIDE (GLUCOTROL) 10 MG tablet Take 1 tablet (10 mg total) by mouth 2 (two) times daily before a meal. 06/19/20   Kerin Perna, NP  glucose blood (TRUE METRIX BLOOD GLUCOSE TEST) test strip Use as instructed to check blood sugar daily. 08/09/19   Charlott Rakes, MD  insulin aspart (NOVOLOG) 100 UNIT/ML injection Inject 6-10 Units into the skin 2 (two) times daily after a meal. Sliding scale Patient taking differently: Inject 6-15 Units into the skin See admin instructions. Inject 6-15 units into the skin two times a day after meals, per sliding scale 11/30/19   Kerin Perna, NP  insulin glargine (LANTUS SOLOSTAR) 100 UNIT/ML Solostar Pen Inject 20 Units into the skin daily. 06/19/20   Kerin Perna, NP  losartan (COZAAR) 100 MG tablet Take 0.5 tablets (50 mg total) by mouth daily. 06/19/20   Kerin Perna, NP  metFORMIN (GLUCOPHAGE) 1000 MG tablet Take 1 tablet (1,000 mg total) by mouth 2 (two) times daily with a meal. 03/06/20   Kerin Perna, NP  nitrofurantoin, macrocrystal-monohydrate, (MACROBID) 100 MG capsule Take 1 capsule (100 mg total) by mouth 2 (  two) times daily. 07/29/20   Sharion Balloon, NP  omeprazole (PRILOSEC) 20 MG capsule Take 1 capsule (20 mg total) by mouth daily. 02/11/20   Tegeler, Gwenyth Allegra, MD  TRUEplus Lancets 28G MISC Use as instructed to check blood sugar daily. 08/09/19   Charlott Rakes, MD  losartan-hydrochlorothiazide (HYZAAR) 100-25 MG tablet Take 1 tablet by mouth daily. 05/10/19 07/07/19  Kerin Perna, NP    Family History Family History  Problem Relation Age of Onset  . Healthy Mother   .  Healthy Father     Social History Social History   Tobacco Use  . Smoking status: Current Every Day Smoker    Packs/day: 0.50    Years: 14.00    Pack years: 7.00    Types: Cigarettes  . Smokeless tobacco: Never Used  . Tobacco comment: 1/2 pack a day  Substance Use Topics  . Alcohol use: Yes    Alcohol/week: 2.0 standard drinks    Types: 2 Cans of beer per week    Comment: occasional  . Drug use: No    Types: Marijuana    Comment: MJ former user.  Last use in September 2017     Allergies   Lisinopril and Penicillins   Review of Systems Review of Systems  Constitutional: Negative for chills and fever.  HENT: Negative for ear pain and sore throat.   Eyes: Negative for pain and visual disturbance.  Respiratory: Negative for cough and shortness of breath.   Cardiovascular: Negative for chest pain and palpitations.  Gastrointestinal: Positive for abdominal pain. Negative for nausea and vomiting.  Genitourinary: Positive for dysuria and hematuria. Negative for flank pain, penile discharge and testicular pain.  Musculoskeletal: Positive for back pain. Negative for arthralgias.  Skin: Negative for color change and rash.  Neurological: Negative for seizures and syncope.  All other systems reviewed and are negative.    Physical Exam Triage Vital Signs ED Triage Vitals  Enc Vitals Group     BP      Pulse      Resp      Temp      Temp src      SpO2      Weight      Height      Head Circumference      Peak Flow      Pain Score      Pain Loc      Pain Edu?      Excl. in [place]?    No data found.  Updated Vital Signs BP 138/88 (BP Location: Right Arm)   Pulse 85   Temp 99.2 F (37.3 C) (Oral)   Resp 20   SpO2 100%   Visual Acuity Right Eye Distance:   Left Eye Distance:   Bilateral Distance:    Right Eye Near:   Left Eye Near:    Bilateral Near:     Physical Exam Vitals and nursing note reviewed.  Constitutional:      General: He is not in acute  distress.    Appearance: He is well-developed and well-nourished. He is not ill-appearing.  HENT:     Head: Normocephalic and atraumatic.     Mouth/Throat:     Mouth: Mucous membranes are moist.  Eyes:     Conjunctiva/sclera: Conjunctivae normal.  Cardiovascular:     Rate and Rhythm: Normal rate and regular rhythm.     Heart sounds: Normal heart sounds.  Pulmonary:     Effort: Pulmonary effort  is normal. No respiratory distress.     Breath sounds: Normal breath sounds.  Abdominal:     General: Bowel sounds are normal.     Palpations: Abdomen is soft.     Tenderness: There is no abdominal tenderness. There is no right CVA tenderness, left CVA tenderness, guarding or rebound.  Musculoskeletal:        General: No edema.     Cervical back: Neck supple.  Skin:    General: Skin is warm and dry.     Findings: No rash.  Neurological:     General: No focal deficit present.     Mental Status: He is alert and oriented to person, place, and time.     Gait: Gait normal.  Psychiatric:        Mood and Affect: Mood and affect and mood normal.        Behavior: Behavior normal.      UC Treatments / Results  Labs (all labs ordered are listed, but only abnormal results are displayed) Labs Reviewed  POCT URINALYSIS DIPSTICK, ED / UC - Abnormal; Notable for the following components:      Result Value   Glucose, UA >=1000 (*)    Ketones, ur 40 (*)    Hgb urine dipstick LARGE (*)    Protein, ur >=300 (*)    Urobilinogen, UA 2.0 (*)    Nitrite POSITIVE (*)    Leukocytes,Ua TRACE (*)    All other components within normal limits  CBG MONITORING, ED - Abnormal; Notable for the following components:   Glucose-Capillary 233 (*)    All other components within normal limits  URINE CULTURE    EKG   Radiology No results found.  Procedures Procedures (including critical care time)  Medications Ordered in UC Medications - No data to display  Initial Impression / Assessment and Plan /  UC Course  I have reviewed the triage vital signs and the nursing notes.  Pertinent labs & imaging results that were available during my care of the patient were reviewed by me and considered in my medical decision making (see chart for details).   UTI with hematuria.  Hyperglycemia with type II DM.  Glucosuria.  Medication nonadherence due to psychosocial problem.  Strong precautions given to patient not to take someone else's insulin.  Instructed him to call his PCPs office tomorrow morning to schedule the soonest available appointment to discuss his hyperglycemia and glucosuria.  Treating UTI with Macrobid.  Urine culture pending.  Discussed with patient that we will call him if the culture shows the need to change or discontinue his antibiotic.  He agrees to plan of care.   Final Clinical Impressions(s) / UC Diagnoses   Final diagnoses:  Urinary tract infection with hematuria, site unspecified  Type 2 diabetes mellitus with hyperglycemia, with long-term current use of insulin ([place])  Glucosuria  Medication nonadherence due to psychosocial problem     Discharge Instructions     Take the antibiotic as directed.  The urine culture is pending.  We will call you if it shows the need to change or discontinue your antibiotic.    Schedule the soonest available appointment with your primary care provider to discuss your high blood sugar and sugar in your urine.        ED Prescriptions    Medication Sig Dispense Auth. Provider   nitrofurantoin, macrocrystal-monohydrate, (MACROBID) 100 MG capsule Take 1 capsule (100 mg total) by mouth 2 (two) times daily. 10 capsule Hall Busing,  Fredderick Phenix, NP     I have reviewed the PDMP during this encounter.   Sharion Balloon, NP 07/29/20 [phone]

## 2020-07-31 ENCOUNTER — Ambulatory Visit: Payer: Self-pay

## 2020-07-31 DIAGNOSIS — Z0289 Encounter for other administrative examinations: Secondary | ICD-10-CM

## 2020-07-31 LAB — URINE CULTURE: Culture: 90000 — AB

## 2020-07-31 NOTE — Telephone Encounter (Signed)
Pt. Reports his blood sugar today is 400. Taking oral medications, but not taking insulin as ordered. No symptoms today. Malachi Bonds in the practice made appointment for Monday. Instructed pt. To go to ED for worsening of symptoms and blood glucose. Verbalizes understanding.  Answer Assessment - Initial Assessment Questions 1. BLOOD GLUCOSE: "What is your blood glucose level?"      400 - now 2. ONSET: "When did you check the blood glucose?"     Now 3. USUAL RANGE: "What is your glucose level usually?" (e.g., usual fasting morning value, usual evening value)     210 - fasting 4. KETONES: "Do you check for ketones (urine or blood test strips)?" If yes, ask: "What does the test show now?"      No 5. TYPE 1 or 2:  "Do you know what type of diabetes you have?"  (e.g., Type 1, Type 2, Gestational; doesn't know)      Type 2 6. INSULIN: "Do you take insulin?" "What type of insulin(s) do you use? What is the mode of delivery? (syringe, pen; injection or pump)?"      Yes 7. DIABETES PILLS: "Do you take any pills for your diabetes?" If yes, ask: "Have you missed taking any pills recently?"     Yes 8. OTHER SYMPTOMS: "Do you have any symptoms?" (e.g., fever, frequent urination, difficulty breathing, dizziness, weakness, vomiting)     No 9. PREGNANCY: "Is there any chance you are pregnant?" "When was your last menstrual period?"     n/a  Protocols used: DIABETES - HIGH BLOOD SUGAR-A-AH

## 2020-08-01 ENCOUNTER — Emergency Department (HOSPITAL_COMMUNITY)
Admission: EM | Admit: 2020-08-01 | Discharge: 2020-08-02 | Disposition: A | Discharge status: Home / Self Care | Payer: Self-pay | Attending: Emergency Medicine | Admitting: Emergency Medicine

## 2020-08-01 ENCOUNTER — Other Ambulatory Visit: Payer: Self-pay

## 2020-08-01 DIAGNOSIS — R739 Hyperglycemia, unspecified: Secondary | ICD-10-CM | POA: Insufficient documentation

## 2020-08-01 DIAGNOSIS — Z5321 Procedure and treatment not carried out due to patient leaving prior to being seen by health care provider: Secondary | ICD-10-CM | POA: Insufficient documentation

## 2020-08-01 DIAGNOSIS — L0231 Cutaneous abscess of buttock: Secondary | ICD-10-CM | POA: Insufficient documentation

## 2020-08-01 LAB — BASIC METABOLIC PANEL
Anion gap: 13 (ref 5–15)
BUN: 11 mg/dL (ref 6–20)
CO2: 22 mmol/L (ref 22–32)
Calcium: 9.7 mg/dL (ref 8.9–10.3)
Chloride: 99 mmol/L (ref 98–111)
Creatinine, Ser: 0.98 mg/dL (ref 0.61–1.24)
GFR, Estimated: >60 mL/min (ref >60)
Glucose, Bld: 363 mg/dL — ABNORMAL HIGH (ref 70–99)
Potassium: 4.1 mmol/L (ref 3.5–5.1)
Sodium: 134 mmol/L — ABNORMAL LOW (ref 135–145)

## 2020-08-01 LAB — CBC
HCT: 41.2 % (ref 39.0–52.0)
Hemoglobin: 15.2 g/dL (ref 13.0–17.0)
MCH: 31.3 pg (ref 26.0–34.0)
MCHC: 36.9 g/dL — ABNORMAL HIGH (ref 30.0–36.0)
MCV: 84.9 fL (ref 80.0–100.0)
Platelets: 261 10*3/uL (ref 150–400)
RBC: 4.85 MIL/uL (ref 4.22–5.81)
RDW: 12.3 % (ref 11.5–15.5)
WBC: 14.4 10*3/uL — ABNORMAL HIGH (ref 4.0–10.5)
nRBC: 0 % (ref 0.0–0.2)

## 2020-08-01 LAB — URINALYSIS, ROUTINE W REFLEX MICROSCOPIC
Bacteria, UA: NONE SEEN
Bilirubin Urine: NEGATIVE
Glucose, UA: >=500 mg/dL — AB
Hgb urine dipstick: NEGATIVE
Ketones, ur: NEGATIVE mg/dL
Leukocytes,Ua: NEGATIVE
Nitrite: NEGATIVE
Protein, ur: 30 mg/dL — AB
Specific Gravity, Urine: 1.036 — ABNORMAL HIGH (ref 1.005–1.030)
pH: 5 (ref 5.0–8.0)

## 2020-08-01 LAB — CBG MONITORING, ED: Glucose-Capillary: 218 mg/dL — ABNORMAL HIGH (ref 70–99)

## 2020-08-01 NOTE — ED Triage Notes (Signed)
Pt here with reports of abcess to buttock "size of a baseball". Pt also reports CBG of 500.

## 2020-08-02 ENCOUNTER — Inpatient Hospital Stay (HOSPITAL_COMMUNITY): Payer: Self-pay | Admitting: Certified Registered Nurse Anesthetist

## 2020-08-02 ENCOUNTER — Encounter (HOSPITAL_COMMUNITY): Payer: Self-pay

## 2020-08-02 ENCOUNTER — Emergency Department (HOSPITAL_COMMUNITY): Payer: Self-pay

## 2020-08-02 ENCOUNTER — Inpatient Hospital Stay (HOSPITAL_COMMUNITY)
Admission: EM | Admit: 2020-08-02 | Discharge: 2020-08-03 | DRG: 345 | Disposition: A | Discharge status: Home / Self Care | Payer: Self-pay | Attending: Family Medicine | Admitting: Family Medicine

## 2020-08-02 ENCOUNTER — Other Ambulatory Visit: Payer: Self-pay

## 2020-08-02 ENCOUNTER — Encounter (HOSPITAL_COMMUNITY)
Admission: EM | Disposition: A | Discharge status: Home / Self Care | Payer: Self-pay | Source: Home / Self Care | Attending: Internal Medicine

## 2020-08-02 DIAGNOSIS — L0591 Pilonidal cyst without abscess: Secondary | ICD-10-CM | POA: Diagnosis present

## 2020-08-02 DIAGNOSIS — Z880 Allergy status to penicillin: Secondary | ICD-10-CM

## 2020-08-02 DIAGNOSIS — R319 Hematuria, unspecified: Secondary | ICD-10-CM | POA: Diagnosis present

## 2020-08-02 DIAGNOSIS — Z79899 Other long term (current) drug therapy: Secondary | ICD-10-CM

## 2020-08-02 DIAGNOSIS — Z794 Long term (current) use of insulin: Secondary | ICD-10-CM

## 2020-08-02 DIAGNOSIS — Z888 Allergy status to other drugs, medicaments and biological substances status: Secondary | ICD-10-CM

## 2020-08-02 DIAGNOSIS — Z7982 Long term (current) use of aspirin: Secondary | ICD-10-CM

## 2020-08-02 DIAGNOSIS — R739 Hyperglycemia, unspecified: Secondary | ICD-10-CM

## 2020-08-02 DIAGNOSIS — E785 Hyperlipidemia, unspecified: Secondary | ICD-10-CM | POA: Diagnosis present

## 2020-08-02 DIAGNOSIS — E7800 Pure hypercholesterolemia, unspecified: Secondary | ICD-10-CM | POA: Diagnosis present

## 2020-08-02 DIAGNOSIS — L0231 Cutaneous abscess of buttock: Secondary | ICD-10-CM

## 2020-08-02 DIAGNOSIS — F17210 Nicotine dependence, cigarettes, uncomplicated: Secondary | ICD-10-CM | POA: Diagnosis present

## 2020-08-02 DIAGNOSIS — E1165 Type 2 diabetes mellitus with hyperglycemia: Secondary | ICD-10-CM | POA: Diagnosis present

## 2020-08-02 DIAGNOSIS — F319 Bipolar disorder, unspecified: Secondary | ICD-10-CM | POA: Diagnosis present

## 2020-08-02 DIAGNOSIS — Z20822 Contact with and (suspected) exposure to covid-19: Secondary | ICD-10-CM | POA: Diagnosis present

## 2020-08-02 DIAGNOSIS — N390 Urinary tract infection, site not specified: Secondary | ICD-10-CM | POA: Diagnosis present

## 2020-08-02 DIAGNOSIS — L0291 Cutaneous abscess, unspecified: Secondary | ICD-10-CM | POA: Diagnosis present

## 2020-08-02 DIAGNOSIS — E1169 Type 2 diabetes mellitus with other specified complication: Secondary | ICD-10-CM

## 2020-08-02 DIAGNOSIS — I10 Essential (primary) hypertension: Secondary | ICD-10-CM | POA: Diagnosis present

## 2020-08-02 DIAGNOSIS — K611 Rectal abscess: Principal | ICD-10-CM | POA: Diagnosis present

## 2020-08-02 HISTORY — PX: INCISION AND DRAINAGE PERIRECTAL ABSCESS: SHX1804

## 2020-08-02 LAB — BASIC METABOLIC PANEL
Anion gap: 12 (ref 5–15)
BUN: 9 mg/dL (ref 6–20)
CO2: 22 mmol/L (ref 22–32)
Calcium: 9 mg/dL (ref 8.9–10.3)
Chloride: 99 mmol/L (ref 98–111)
Creatinine, Ser: 0.87 mg/dL (ref 0.61–1.24)
GFR, Estimated: >60 mL/min (ref >60)
Glucose, Bld: 281 mg/dL — ABNORMAL HIGH (ref 70–99)
Potassium: 3.6 mmol/L (ref 3.5–5.1)
Sodium: 133 mmol/L — ABNORMAL LOW (ref 135–145)

## 2020-08-02 LAB — URINALYSIS, ROUTINE W REFLEX MICROSCOPIC
Bacteria, UA: NONE SEEN
Bilirubin Urine: NEGATIVE
Glucose, UA: >=500 mg/dL — AB
Hgb urine dipstick: NEGATIVE
Ketones, ur: 5 mg/dL — AB
Leukocytes,Ua: NEGATIVE
Nitrite: NEGATIVE
Protein, ur: NEGATIVE mg/dL
Specific Gravity, Urine: 1.04 — ABNORMAL HIGH (ref 1.005–1.030)
pH: 6 (ref 5.0–8.0)

## 2020-08-02 LAB — CBC WITH DIFFERENTIAL/PLATELET
Abs Immature Granulocytes: 0.06 10*3/uL (ref 0.00–0.07)
Basophils Absolute: 0 10*3/uL (ref 0.0–0.1)
Basophils Relative: 0 %
Eosinophils Absolute: 0.1 10*3/uL (ref 0.0–0.5)
Eosinophils Relative: 1 %
HCT: 38 % — ABNORMAL LOW (ref 39.0–52.0)
Hemoglobin: 14 g/dL (ref 13.0–17.0)
Immature Granulocytes: 0 %
Lymphocytes Relative: 14 %
Lymphs Abs: 2.1 10*3/uL (ref 0.7–4.0)
MCH: 31.2 pg (ref 26.0–34.0)
MCHC: 36.8 g/dL — ABNORMAL HIGH (ref 30.0–36.0)
MCV: 84.6 fL (ref 80.0–100.0)
Monocytes Absolute: 1.9 10*3/uL — ABNORMAL HIGH (ref 0.1–1.0)
Monocytes Relative: 13 %
Neutro Abs: 10.7 10*3/uL — ABNORMAL HIGH (ref 1.7–7.7)
Neutrophils Relative %: 72 %
Platelets: 257 10*3/uL (ref 150–400)
RBC: 4.49 MIL/uL (ref 4.22–5.81)
RDW: 12.1 % (ref 11.5–15.5)
WBC: 14.9 10*3/uL — ABNORMAL HIGH (ref 4.0–10.5)
nRBC: 0 % (ref 0.0–0.2)

## 2020-08-02 LAB — CBG MONITORING, ED: Glucose-Capillary: 302 mg/dL — ABNORMAL HIGH (ref 70–99)

## 2020-08-02 LAB — GLUCOSE, CAPILLARY
Glucose-Capillary: 271 mg/dL — ABNORMAL HIGH (ref 70–99)
Glucose-Capillary: 272 mg/dL — ABNORMAL HIGH (ref 70–99)
Glucose-Capillary: 304 mg/dL — ABNORMAL HIGH (ref 70–99)

## 2020-08-02 LAB — RESP PANEL BY RT-PCR (FLU A&B, COVID) ARPGX2
Influenza A by PCR: NEGATIVE
Influenza B by PCR: NEGATIVE
SARS Coronavirus 2 by RT PCR: NEGATIVE

## 2020-08-02 SURGERY — INCISION AND DRAINAGE, ABSCESS, PERIRECTAL
Anesthesia: General

## 2020-08-02 MED ORDER — ASPIRIN EC 325 MG PO TBEC
325.0000 mg | DELAYED_RELEASE_TABLET | Freq: Every day | ORAL | Status: DC
  Administered 2020-08-03: 11:00:00 325 mg via ORAL
  Filled 2020-08-02: qty 1

## 2020-08-02 MED ORDER — LIDOCAINE 2% (20 MG/ML) 5 ML SYRINGE
INTRAMUSCULAR | Status: DC | PRN
  Administered 2020-08-02: 80 mg via INTRAVENOUS

## 2020-08-02 MED ORDER — IOHEXOL 300 MG/ML  SOLN
100.0000 mL | Freq: Once | INTRAMUSCULAR | Status: AC | PRN
  Administered 2020-08-02: 11:00:00 100 mL via INTRAVENOUS

## 2020-08-02 MED ORDER — ACETAMINOPHEN 500 MG PO TABS
1000.0000 mg | ORAL_TABLET | Freq: Once | ORAL | Status: AC
  Administered 2020-08-02: 14:00:00 1000 mg via ORAL
  Filled 2020-08-02: qty 2

## 2020-08-02 MED ORDER — INSULIN GLARGINE 100 UNIT/ML ~~LOC~~ SOLN
20.0000 [IU] | Freq: Every day | SUBCUTANEOUS | Status: DC
  Administered 2020-08-02: 21:00:00 20 [IU] via SUBCUTANEOUS
  Filled 2020-08-02: qty 0.2

## 2020-08-02 MED ORDER — PROPOFOL 10 MG/ML IV BOLUS
INTRAVENOUS | Status: DC | PRN
  Administered 2020-08-02: 180 mg via INTRAVENOUS

## 2020-08-02 MED ORDER — FENTANYL CITRATE (PF) 100 MCG/2ML IJ SOLN: 25.0000 ug | INTRAMUSCULAR | Status: DC | PRN

## 2020-08-02 MED ORDER — INSULIN ASPART 100 UNIT/ML ~~LOC~~ SOLN
0.0000 [IU] | Freq: Every day | SUBCUTANEOUS | Status: DC
  Administered 2020-08-02: 21:00:00 4 [IU] via SUBCUTANEOUS

## 2020-08-02 MED ORDER — SODIUM CHLORIDE (PF) 0.9 % IJ SOLN
INTRAMUSCULAR | Status: AC
  Filled 2020-08-02: qty 50

## 2020-08-02 MED ORDER — SODIUM CHLORIDE 0.9 % IV SOLN: 2.0000 g | Freq: Once | INTRAVENOUS | Status: DC

## 2020-08-02 MED ORDER — METRONIDAZOLE 500 MG PO TABS: 500.0000 mg | ORAL_TABLET | Freq: Two times a day (BID) | ORAL | Status: DC

## 2020-08-02 MED ORDER — BUPIVACAINE LIPOSOME 1.3 % IJ SUSP: 20.0000 mL | Freq: Once | INTRAMUSCULAR | Status: DC

## 2020-08-02 MED ORDER — ACETAMINOPHEN 325 MG PO TABS
650.0000 mg | ORAL_TABLET | Freq: Four times a day (QID) | ORAL | Status: DC | PRN
  Administered 2020-08-03: 15:00:00 650 mg via ORAL
  Filled 2020-08-02: qty 2

## 2020-08-02 MED ORDER — INSULIN ASPART 100 UNIT/ML ~~LOC~~ SOLN
0.0000 [IU] | Freq: Three times a day (TID) | SUBCUTANEOUS | Status: DC
  Administered 2020-08-03: 08:00:00 3 [IU] via SUBCUTANEOUS
  Administered 2020-08-03: 12:00:00 5 [IU] via SUBCUTANEOUS

## 2020-08-02 MED ORDER — ACETAMINOPHEN 650 MG RE SUPP: 650.0000 mg | Freq: Four times a day (QID) | RECTAL | Status: DC | PRN

## 2020-08-02 MED ORDER — MIDAZOLAM HCL 5 MG/5ML IJ SOLN
INTRAMUSCULAR | Status: DC | PRN
  Administered 2020-08-02: 2 mg via INTRAVENOUS

## 2020-08-02 MED ORDER — CHLORHEXIDINE GLUCONATE 0.12 % MT SOLN
15.0000 mL | Freq: Once | OROMUCOSAL | Status: AC
  Administered 2020-08-02: 14:00:00 15 mL via OROMUCOSAL

## 2020-08-02 MED ORDER — LACTATED RINGERS IV SOLN: INTRAVENOUS | Status: DC

## 2020-08-02 MED ORDER — SODIUM CHLORIDE 0.9 % IV SOLN
1.0000 g | Freq: Once | INTRAVENOUS | Status: DC
  Administered 2020-08-02: 17:00:00 1 g via INTRAVENOUS
  Filled 2020-08-02: qty 1

## 2020-08-02 MED ORDER — CELECOXIB 200 MG PO CAPS
200.0000 mg | ORAL_CAPSULE | Freq: Once | ORAL | Status: AC
  Administered 2020-08-02: 14:00:00 200 mg via ORAL
  Filled 2020-08-02 (×2): qty 1

## 2020-08-02 MED ORDER — SODIUM CHLORIDE 0.9 % IV BOLUS
500.0000 mL | Freq: Once | INTRAVENOUS | Status: AC
  Administered 2020-08-02: 09:00:00 500 mL via INTRAVENOUS

## 2020-08-02 MED ORDER — ONDANSETRON HCL 4 MG/2ML IJ SOLN
INTRAMUSCULAR | Status: DC | PRN
  Administered 2020-08-02: 4 mg via INTRAVENOUS

## 2020-08-02 MED ORDER — BUPIVACAINE LIPOSOME 1.3 % IJ SUSP
20.0000 mL | Freq: Once | INTRAMUSCULAR | Status: AC
  Administered 2020-08-02: 15:00:00 20 mL
  Filled 2020-08-02: qty 20

## 2020-08-02 MED ORDER — INSULIN ASPART 100 UNIT/ML ~~LOC~~ SOLN
4.0000 [IU] | Freq: Three times a day (TID) | SUBCUTANEOUS | Status: DC
  Administered 2020-08-03 (×2): 4 [IU] via SUBCUTANEOUS

## 2020-08-02 MED ORDER — ORAL CARE MOUTH RINSE: 15.0000 mL | Freq: Once | OROMUCOSAL | Status: AC

## 2020-08-02 MED ORDER — ATORVASTATIN CALCIUM 20 MG PO TABS
20.0000 mg | ORAL_TABLET | Freq: Every day | ORAL | Status: DC
  Administered 2020-08-03: 11:00:00 20 mg via ORAL
  Filled 2020-08-02: qty 1

## 2020-08-02 MED ORDER — OXYCODONE HCL 5 MG PO TABS: 5.0000 mg | ORAL_TABLET | ORAL | Status: DC | PRN

## 2020-08-02 MED ORDER — SODIUM CHLORIDE 0.9 % IV SOLN
1.0000 g | Freq: Once | INTRAVENOUS | Status: AC
  Administered 2020-08-02: 14:00:00 1 g via INTRAVENOUS
  Filled 2020-08-02 (×3): qty 10

## 2020-08-02 MED ORDER — POTASSIUM CHLORIDE CRYS ER 10 MEQ PO TBCR: 10.0000 meq | EXTENDED_RELEASE_TABLET | Freq: Every day | ORAL | Status: DC | PRN

## 2020-08-02 MED ORDER — HYDROCODONE-ACETAMINOPHEN 5-325 MG PO TABS
1.0000 | ORAL_TABLET | ORAL | Status: DC | PRN
  Administered 2020-08-03: 07:00:00 2 via ORAL
  Filled 2020-08-02 (×2): qty 2

## 2020-08-02 MED ORDER — BUPIVACAINE-EPINEPHRINE 0.5% -1:200000 IJ SOLN
INTRAMUSCULAR | Status: AC
  Filled 2020-08-02: qty 1

## 2020-08-02 MED ORDER — ACETAMINOPHEN 500 MG PO TABS: 1000.0000 mg | ORAL_TABLET | Freq: Four times a day (QID) | ORAL | Status: DC | PRN

## 2020-08-02 MED ORDER — INSULIN ASPART 100 UNIT/ML ~~LOC~~ SOLN: 10.0000 [IU] | Freq: Once | SUBCUTANEOUS | Status: AC

## 2020-08-02 MED ORDER — METRONIDAZOLE 500 MG PO TABS
500.0000 mg | ORAL_TABLET | Freq: Three times a day (TID) | ORAL | Status: DC
Start: 2020-08-02 — End: 2020-08-02

## 2020-08-02 MED ORDER — AMLODIPINE BESYLATE 10 MG PO TABS
10.0000 mg | ORAL_TABLET | Freq: Every day | ORAL | Status: DC
  Administered 2020-08-03: 11:00:00 10 mg via ORAL
  Filled 2020-08-02: qty 1

## 2020-08-02 MED ORDER — PROMETHAZINE HCL 25 MG/ML IJ SOLN: 6.2500 mg | INTRAMUSCULAR | Status: DC | PRN

## 2020-08-02 MED ORDER — FENTANYL CITRATE (PF) 100 MCG/2ML IJ SOLN
INTRAMUSCULAR | Status: DC | PRN
  Administered 2020-08-02 (×4): 50 ug via INTRAVENOUS

## 2020-08-02 MED ORDER — MORPHINE SULFATE (PF) 4 MG/ML IV SOLN
4.0000 mg | Freq: Once | INTRAVENOUS | Status: AC
  Administered 2020-08-02: 09:00:00 4 mg via INTRAVENOUS
  Filled 2020-08-02: qty 1

## 2020-08-02 MED ORDER — SULFAMETHOXAZOLE-TRIMETHOPRIM 800-160 MG PO TABS
2.0000 | ORAL_TABLET | Freq: Two times a day (BID) | ORAL | Status: DC
  Administered 2020-08-02 – 2020-08-03 (×2): 2 via ORAL
  Filled 2020-08-02 (×2): qty 2

## 2020-08-02 MED ORDER — SODIUM CHLORIDE 0.9 % IV SOLN: 2.0000 g | INTRAVENOUS | Status: DC

## 2020-08-02 MED ORDER — MORPHINE SULFATE (PF) 2 MG/ML IV SOLN: 2.0000 mg | INTRAVENOUS | Status: DC | PRN

## 2020-08-02 MED ORDER — METOPROLOL TARTRATE 5 MG/5ML IV SOLN: 5.0000 mg | Freq: Four times a day (QID) | INTRAVENOUS | Status: DC | PRN

## 2020-08-02 MED ORDER — INSULIN ASPART 100 UNIT/ML ~~LOC~~ SOLN
SUBCUTANEOUS | Status: AC
  Administered 2020-08-02: 16:00:00 10 [IU] via SUBCUTANEOUS
  Filled 2020-08-02: qty 1

## 2020-08-02 MED ORDER — BUPIVACAINE-EPINEPHRINE 0.5% -1:200000 IJ SOLN
INTRAMUSCULAR | Status: DC | PRN
  Administered 2020-08-02: 20 mL

## 2020-08-02 MED ORDER — ENOXAPARIN SODIUM 40 MG/0.4ML ~~LOC~~ SOLN: 40.0000 mg | SUBCUTANEOUS | Status: DC

## 2020-08-02 MED ORDER — METRONIDAZOLE IN NACL 5-0.79 MG/ML-% IV SOLN
500.0000 mg | Freq: Once | INTRAVENOUS | Status: AC
  Administered 2020-08-02: 14:00:00 500 mg via INTRAVENOUS
  Filled 2020-08-02 (×2): qty 100

## 2020-08-02 MED ORDER — ONDANSETRON HCL 4 MG/2ML IJ SOLN
4.0000 mg | Freq: Once | INTRAMUSCULAR | Status: AC
  Administered 2020-08-02: 09:00:00 4 mg via INTRAVENOUS
  Filled 2020-08-02: qty 2

## 2020-08-02 SURGICAL SUPPLY — 21 items
BLADE SURG 15 STRL LF DISP TIS (BLADE) ×1 IMPLANT
BLADE SURG 15 STRL SS (BLADE) ×3
COVER WAND RF STERILE (DRAPES) IMPLANT
DRAIN PENROSE 0.25X18 (DRAIN) IMPLANT
DRSG PAD ABDOMINAL 8X10 ST (GAUZE/BANDAGES/DRESSINGS) ×4 IMPLANT
GAUZE 4X4 16PLY RFD (DISPOSABLE) ×1 IMPLANT
GAUZE PACKING 1 X5 YD ST (GAUZE/BANDAGES/DRESSINGS) ×2 IMPLANT
GAUZE SPONGE 4X4 12PLY STRL (GAUZE/BANDAGES/DRESSINGS) ×4 IMPLANT
GLOVE BIO SURGEON STRL SZ 6.5 (GLOVE) ×2 IMPLANT
GLOVE BIO SURGEONS STRL SZ 6.5 (GLOVE) ×1
GLOVE BIOGEL PI IND STRL 7.0 (GLOVE) ×1 IMPLANT
GLOVE BIOGEL PI INDICATOR 7.0 (GLOVE) ×2
GOWN STRL REUS W/TWL XL LVL3 (GOWN DISPOSABLE) ×6 IMPLANT
KIT TURNOVER KIT A (KITS) IMPLANT
PACK LITHOTOMY IV (CUSTOM PROCEDURE TRAY) ×3 IMPLANT
PENCIL SMOKE EVACUATOR (MISCELLANEOUS) IMPLANT
SURGILUBE 2OZ TUBE FLIPTOP (MISCELLANEOUS) ×1 IMPLANT
SUT SILK 2 0 (SUTURE)
SUT SILK 2-0 18XBRD TIE 12 (SUTURE) IMPLANT
TOWEL OR 17X26 10 PK STRL BLUE (TOWEL DISPOSABLE) ×3 IMPLANT
TOWEL OR NON WOVEN STRL DISP B (DISPOSABLE) ×3 IMPLANT

## 2020-08-02 NOTE — Progress Notes (Addendum)
Inpatient Diabetes Program Recommendations  AACE/ADA: New Consensus Statement on Inpatient Glycemic Control (2015)  Target Ranges:  Prepandial:   less than 140 mg/dL      Peak postprandial:   less than 180 mg/dL (1-2 hours)      Critically ill patients:  140 - 180 mg/dL   Lab Results  Component Value Date   GLUCAP 302 (H) 08/02/2020   HGBA1C 8.7 (A) 06/19/2020    Review of Glycemic Control Results for Brandon Mcneil, Brandon Mcneil (MRN [id number]) as of 08/02/2020 13:37  Ref. Range 08/01/2020 21:48 08/02/2020 07:04  Glucose-Capillary Latest Ref Range: 70 - 99 mg/dL 183 (H) 358 (H)   Diabetes history: Type 2 DM Outpatient Diabetes medications: Glipizide 10 mg BID, Novolog 6-15 units BID, Lantus 20 units QD, Metformin 1000 mg BID Current orders for Inpatient glycemic control: none  Inpatient Diabetes Program Recommendations:    At this time, consider adding:  -Lantus 15 units Q24H (to start following surgical case) -Novolog 0-15 units TID &HS  Per MD notes, looks like patient has been using family member's insulin. Patient is followed by [facility] with last appointment on 06/19/20 where all antidiabetic medications were refilled, so unclear as to why patient did not have own prescription. Will place [facility] consult to help assist.   Thanks, Lujean Rave, MSN, RNC-OB Diabetes Coordinator [phone] (8a-5p)

## 2020-08-02 NOTE — ED Provider Notes (Signed)
[place] DEPT Provider Note   CSN: [id number] Arrival date & time: 08/02/20  6579     History Chief Complaint  Patient presents with  . Abscess    Brandon Mcneil is a 39 y.o. male.  Brandon Mcneil is a 39 y.o. male with a history of hypertension, hyperlipidemia, diabetes, bipolar disorder, who presents to the emergency department for evaluation of abscess to the right buttock that has been present since Tuesday.  He reports it has gotten significantly larger very quickly.  He reports some pain when he sits down to have a bowel movement but he has been able to pass stools.  Has not noted any drainage from the area since it began.  He has had previous abscesses in similar locations but these of always been much smaller and have either drained on their own or even easily drained in the emergency department or urgent care.  He initially checked into [facility], they checked his blood sugar there and it was 500, he left due to long wait time and took an additional 500 mg of Metformin at home due to his high blood sugar.  He reports his blood sugars have been difficult to control over the past month.  He went to urgent care On Monday due to noting some blood in his urine with associated suprapubic pressure and low back pain.  At this time he had not been taking his Lantus due to difficulty affording the medication but had you been using a family members insulin.  Patient placed on Macrobid for UTI and encouraged to follow-up with his PCP for further treatment of his hyperglycemia.  He denies fevers or chills, no nausea or vomiting, no abdominal pain.  No other aggravating or alleviating factors.        Past Medical History:  Diagnosis Date  . Depression   . Diabetes ([place])   . Essential hypertension 02/03/2017  . Hypercholesteremia   . Hypertension     Patient Active Problem List   Diagnosis Date Noted  . Abscess 08/02/2020  . Stress 02/08/2017  .  Essential hypertension 02/03/2017  . Type 2 diabetes mellitus with complication, without long-term current use of insulin ([place]) 02/03/2017  . Medication overdose 10/31/2013  . Bipolar I disorder, most recent episode depressed ([place]) 09/11/2013  . Tobacco use disorder 09/11/2013  . Newly diagnosed diabetes ([place]) 09/11/2013  . Dehydration 09/11/2013  . Pain, dental 09/11/2013  . DKA (diabetic ketoacidoses) 09/10/2013    History reviewed. No pertinent surgical history.     Family History  Problem Relation Age of Onset  . Healthy Mother   . Healthy Father     Social History   Tobacco Use  . Smoking status: Current Every Day Smoker    Packs/day: 0.50    Years: 14.00    Pack years: 7.00    Types: Cigarettes  . Smokeless tobacco: Never Used  . Tobacco comment: 1/2 pack a day  Substance Use Topics  . Alcohol use: Yes    Alcohol/week: 2.0 standard drinks    Types: 2 Cans of beer per week    Comment: occasional  . Drug use: No    Types: Marijuana    Comment: [employer]J former user.  Last use in September 2017    Home Medications Prior to Admission medications   Medication Sig Start Date End Date Taking? Authorizing Provider  acetaminophen (TYLENOL) 500 MG tablet Take 1,000 mg by mouth every 6 (six) hours as needed for  mild pain.   Yes [provider]  amLODipine (NORVASC) 10 MG tablet Take 1 tablet (10 mg total) by mouth daily. 06/19/20  Yes Kerin Perna, NP  aspirin 325 MG EC tablet Take 325 mg by mouth daily.   Yes [provider]  atorvastatin (LIPITOR) 20 MG tablet Take 1 tablet (20 mg total) by mouth daily. 06/19/20  Yes Kerin Perna, NP  glipiZIDE (GLUCOTROL) 10 MG tablet Take 1 tablet (10 mg total) by mouth 2 (two) times daily before a meal. 06/19/20  Yes Kerin Perna, NP  metFORMIN (GLUCOPHAGE) 1000 MG tablet Take 1 tablet (1,000 mg total) by mouth 2 (two) times daily with a meal. Patient taking differently: Take 500-1,000 mg by mouth 2 (two)  times daily as needed ("Depending on what my blood sugar is. Just how much I think I need."). 03/06/20  Yes Kerin Perna, NP  nitrofurantoin, macrocrystal-monohydrate, (MACROBID) 100 MG capsule Take 1 capsule (100 mg total) by mouth 2 (two) times daily. 07/29/20  Yes Sharion Balloon, NP  potassium chloride (KLOR-CON) 10 MEQ tablet Take 10 mEq by mouth daily as needed ("When I feel dehydrated").   Yes [provider]  blood glucose meter kit and supplies KIT Dispense based on patient and insurance preference. Use up to four times daily as directed. (FOR ICD-9 250.00, 250.01). 03/13/20   Vanessa Kick, MD  Blood Glucose Monitoring Suppl (TRUE METRIX METER) DEVI 1 kit by Does not apply route daily. Use as instructed to check blood sugar daily. 08/09/19   Charlott Rakes, MD  glucose blood (TRUE METRIX BLOOD GLUCOSE TEST) test strip Use as instructed to check blood sugar daily. 08/09/19   Charlott Rakes, MD  insulin aspart (NOVOLOG) 100 UNIT/ML injection Inject 6-10 Units into the skin 2 (two) times daily after a meal. Sliding scale Patient not taking: Reported on 08/02/2020 11/30/19   Kerin Perna, NP  insulin glargine (LANTUS SOLOSTAR) 100 UNIT/ML Solostar Pen Inject 20 Units into the skin daily. Patient not taking: No sig reported 06/19/20   Kerin Perna, NP  losartan (COZAAR) 100 MG tablet Take 0.5 tablets (50 mg total) by mouth daily. Patient not taking: No sig reported 06/19/20   Kerin Perna, NP  omeprazole (PRILOSEC) 20 MG capsule Take 1 capsule (20 mg total) by mouth daily. Patient not taking: No sig reported 02/11/20   Tegeler, Gwenyth Allegra, MD  TRUEplus Lancets 28G MISC Use as instructed to check blood sugar daily. 08/09/19   Charlott Rakes, MD  losartan-hydrochlorothiazide (HYZAAR) 100-25 MG tablet Take 1 tablet by mouth daily. 05/10/19 07/07/19  Kerin Perna, NP    Allergies    Lisinopril and Penicillins  Review of Systems   Review of Systems   Constitutional: Negative for chills and fever.  Respiratory: Negative for cough and shortness of breath.   Cardiovascular: Negative for chest pain.  Gastrointestinal: Positive for rectal pain. Negative for abdominal pain, nausea and vomiting.  Genitourinary: Negative for dysuria and frequency.  Musculoskeletal: Negative for arthralgias and myalgias.  Skin: Negative for color change and rash.       Abscess  All other systems reviewed and are negative.   Physical Exam Updated Vital Signs BP (!) 147/95 (BP Location: Left Arm)   Pulse 92   Temp 98.7 F (37.1 C) (Oral)   Resp 16   Ht '5\' 8"'  (1.727 m)   Wt 97.5 kg   SpO2 99%   BMI 32.69 kg/m   Physical Exam  Vitals and nursing note reviewed. Exam conducted with a chaperone present.  Constitutional:      General: He is not in acute distress.    Appearance: Normal appearance. He is well-developed and well-nourished. He is obese. He is not ill-appearing or diaphoretic.  HENT:     Head: Normocephalic and atraumatic.     Mouth/Throat:     Mouth: Mucous membranes are moist.     Pharynx: Oropharynx is clear.  Eyes:     General:        Right eye: No discharge.        Left eye: No discharge.  Cardiovascular:     Rate and Rhythm: Normal rate and regular rhythm.     Heart sounds: Normal heart sounds.  Pulmonary:     Effort: Pulmonary effort is normal. No respiratory distress.     Breath sounds: Normal breath sounds.     Comments: Respirations equal and unlabored, patient able to speak in full sentences, lungs clear to auscultation bilaterally Abdominal:     General: Bowel sounds are normal. There is no distension.     Palpations: Abdomen is soft. There is no mass.     Tenderness: There is no abdominal tenderness. There is no guarding.     Comments: Respirations equal and unlabored, patient able to speak in full sentences, lungs clear to auscultation bilaterally   Genitourinary:      Comments: Tenderness and induration in the  distribution noted above on the right buttock surrounding the anal sphincter, no expressible drainage, no clear area of fluctuance for drainage, tracks towards the perineum but does not extend to the scrotum or groin. Musculoskeletal:        General: No deformity.     Cervical back: Neck supple.  Skin:    General: Skin is warm and dry.  Neurological:     Mental Status: He is alert and oriented to person, place, and time.     Coordination: Coordination normal.     Comments: Speech is clear, able to follow commands Moves extremities without ataxia, coordination intact  Psychiatric:        Mood and Affect: Mood and affect and mood normal.        Behavior: Behavior normal.     ED Results / Procedures / Treatments   Labs (all labs ordered are listed, but only abnormal results are displayed) Labs Reviewed  BASIC METABOLIC PANEL - Abnormal; Notable for the following components:      Result Value   Sodium 133 (*)    Glucose, Bld 281 (*)    All other components within normal limits  CBC WITH DIFFERENTIAL/PLATELET - Abnormal; Notable for the following components:   WBC 14.9 (*)    HCT 38.0 (*)    MCHC 36.8 (*)    Neutro Abs 10.7 (*)    Monocytes Absolute 1.9 (*)    All other components within normal limits  URINALYSIS, ROUTINE W REFLEX MICROSCOPIC - Abnormal; Notable for the following components:   Color, Urine AMBER (*)    Specific Gravity, Urine 1.040 (*)    Glucose, UA >=500 (*)    Ketones, ur 5 (*)    All other components within normal limits  GLUCOSE, CAPILLARY - Abnormal; Notable for the following components:   Glucose-Capillary 271 (*)    All other components within normal limits  GLUCOSE, CAPILLARY - Abnormal; Notable for the following components:   Glucose-Capillary 272 (*)    All other components within normal limits  CBG  MONITORING, ED - Abnormal; Notable for the following components:   Glucose-Capillary 302 (*)    All other components within normal limits  RESP PANEL  BY RT-PCR (FLU A&B, COVID) [id number]  AEROBIC/ANAEROBIC CULTURE (SURGICAL/DEEP WOUND)    EKG None  Radiology CT PELVIS W CONTRAST  Result Date: 08/02/2020 CLINICAL DATA:  Right buttock abscess. EXAM: CT PELVIS WITH CONTRAST TECHNIQUE: Multidetector CT imaging of the pelvis was performed using the standard protocol following the bolus administration of intravenous contrast. CONTRAST:  174m OMNIPAQUE IOHEXOL 300 MG/ML  SOLN COMPARISON:  CT abdomen pelvis dated October 22, 2019. FINDINGS: Urinary Tract:  No abnormality visualized. Bowel: Unremarkable visualized pelvic bowel loops. Normal appendix. Vascular/Lymphatic: Enlarged right inguinal lymph node measuring 1.6 cm in short axis, reactive. No significant vascular abnormality seen. Reproductive:  Prostate is unremarkable. Other: 7.4 x 2.3 x 6.3 cm thick walled rim enhancing fluid collection in the medial inferior aspect of the right buttock along the gluteal cleft with overlying skin thickening. Separate and more superior 1.7 x 1.1 x 2.3 cm thick-walled rim enhancing fluid collection in the medial right buttock along the gluteal cleft (series 2, image 45), unchanged since prior CT in March 2021. Musculoskeletal: No acute or significant osseous findings. IMPRESSION: 1. 7.4 cm thick walled rim enhancing fluid collection in the medial inferior aspect of the right buttock along the gluteal cleft, consistent with abscess. 2. Separate and more superior 1.7 cm thick-walled rim enhancing fluid collection in the medial right buttock along the gluteal cleft, unchanged since March. This could be a pilonidal cyst or small abscess. Electronically Signed   By: WTitus DubinM.D.   On: 08/02/2020 11:43    Procedures Procedures (including critical care time)  Medications Ordered in ED Medications  sodium chloride 0.9 % bolus 500 mL (0 mLs Intravenous Stopped 08/02/20 0939)  ondansetron (ZOFRAN) injection 4 mg (4 mg Intravenous Given 08/02/20 0830)  morphine 4 MG/ML  injection 4 mg (4 mg Intravenous Given 08/02/20 0833)  iohexol (OMNIPAQUE) 300 MG/ML solution 100 mL (100 mLs Intravenous Contrast Given 08/02/20 1111)  sodium chloride (PF) 0.9 % injection (  Given by Other 08/02/20 1154)  cefTRIAXone (ROCEPHIN) 1 g in sodium chloride 0.9 % 100 mL IVPB (1 g Intravenous New Bag/Given 08/02/20 1409)  metroNIDAZOLE (FLAGYL) IVPB 500 mg (500 mg Intravenous Given 08/02/20 1415)    ED Course  I have reviewed the triage vital signs and the nursing notes.  Pertinent labs & imaging results that were available during my care of the patient were reviewed by me and considered in my medical decision making (see chart for details).    MDM Rules/Calculators/A&P                          39year old male with poorly controlled diabetes presents with concern for abscess to the right buttock.  On exam he has a large area of induration surrounding the right side of the anal sphincter and tracking towards the perineum and scrotum I do not palpate any involvement of the scrotum.  There is no crepitus.  I am concerned given the size of this and proximity to anal sphincter and rectum.  Will get labs and CT of the pelvis with contrast to better assess size and depth of abscess.  There is no clear area of fluctuance for drainage noted on exam currently.  Patient with leukocytosis of 14.9 with left shift, normal hemoglobin. Glucose of 281 with expected sodium of 133,  no other electrolyte derangements, normal renal function.  Concerned that patient's diabetes is poorly controlled, sugar earlier was around 299, this could certainly contribute to worsening infection and poor healing with abscess drainage. No evidence of DKA and no ketones. No signs of infection on UA.  CT significant for 7.4 x 2.3 x 6.3 cm abscess n the medial inferior aspect of the right buttock along the gluteal cleft.  There is also a smaller, separate 1.7 cm fluid collection at the gluteal cleft unchanged since March,  likely a pilonidal cyst or small abscess.  Patient does have history of this.  Given significant size large area of surrounding stranding and inflammation feel this would likely be best served to be drained in the OR.  Will consult general surgery.  Case discussed with PA Claiborne Billings with general surgery, they will plan to take patient to the OR for drainage, requests IV Rocephin and Flagyl for antibiotic coverage.  Given patient's poorly controlled diabetes request medicine admission for assistance with diabetes management.  Case discussed with Dr. Neysa Bonito with [facility] hospitalist who will see and admit the patient.  Final Clinical Impression(s) / ED Diagnoses Final diagnoses:  Abscess of buttock, right  Hyperglycemia    Rx / DC Orders ED Discharge Orders    None       Janet Berlin 08/02/20 1558    Lacretia Leigh, MD 08/03/20 1326

## 2020-08-02 NOTE — Transfer of Care (Signed)
Immediate Anesthesia Transfer of Care Note  Patient: Brandon Mcneil  Procedure(s) Performed: IRRIGATION AND DEBRIDEMENT PERIRECTAL ABSCESS (N/A )  Patient Location: PACU  Anesthesia Type:General  Level of Consciousness: awake, alert  and oriented  Airway & Oxygen Therapy: Patient Spontanous Breathing and Patient connected to face mask oxygen  Post-op Assessment: Report given to RN and Post -op Vital signs reviewed and stable  Post vital signs: Reviewed and stable  Last Vitals:  Vitals Value Taken Time  BP    Temp    Pulse 86 08/02/20 1515  Resp 10 08/02/20 1515  SpO2 100 % 08/02/20 1515  Vitals shown include unvalidated device data.  Last Pain:  Vitals:   08/02/20 1344  TempSrc: Oral  PainSc:       Patients Stated Pain Goal: 4 (08/02/20 1332)  Complications: No complications documented.

## 2020-08-02 NOTE — ED Notes (Signed)
Pt made aware that urine sample is needed.

## 2020-08-02 NOTE — Consult Note (Signed)
Consult Note  Brandon Mcneil 08/06/1981  [id number].    Requesting MD: Jodi Geralds PA-C Chief Complaint/Reason for Consult: R gluteal abscess HPI:  Patient is a 39 year old male with poorly controlled T2DM who presented to [facility] this AM with right gluteal swelling and pain. Patient initially went to [facility] but left without being seen and then came here. Patient reports he was seen at urgent care 12/13 for hematuria and started on antibiotics for a UTI. This area was noted then but felt that it would get better with antibiotics as well. Patient reports he had the same area lanced about 7-8 months ago and improved. He has not noted any drainage from the area. He reports subjective fever and chills at home but has not checked temp. He has a PCP and is on medications for T2DM but A1C is 8 as of 06/2020 and reports blood glucose is usually around 220 at home. PMH otherwise significant for HTN and HLD. He is not on any blood thinners. He is allergic to PCNs. 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 He reports that he drinks 6 beers several nights per week and has had tremors when stopping drinking in the past. He has not had any alcohol since Saturday and does not report tremors currently. He smokes 405 cigarettes daily.   ROS: Review of Systems  Constitutional: Positive for chills and fever.  Respiratory: Negative for shortness of breath and wheezing.   Cardiovascular: Negative for chest pain and palpitations.  Gastrointestinal: Positive for abdominal pain (suprapubic, improving). Negative for blood in stool, constipation, diarrhea, nausea and vomiting.  Genitourinary: Positive for dysuria and hematuria.       Pain in R buttock  All other systems reviewed and are negative.   Family History  Problem Relation Age of Onset  . Healthy Mother   . Healthy Father     Past Medical History:  Diagnosis Date  . Depression   . Diabetes (HCC)   . Essential hypertension 02/03/2017   . Hypercholesteremia   . Hypertension     History reviewed. No pertinent surgical history.  Social History:  reports that he has been smoking cigarettes. He has a 7.00 pack-year smoking history. He has never used smokeless tobacco. He reports current alcohol use of about 2.0 standard drinks of alcohol per week. He reports that he does not use drugs.  Allergies:  Allergies  Allergen Reactions  . Lisinopril Swelling and Other (See Comments)    Lips and mouth became swollen  . Penicillins Itching, Swelling and Rash    (Not in a hospital admission)   Blood pressure 125/79, pulse 83, temperature 98.7 F (37.1 C), temperature source Oral, resp. rate 18, height 5\' 8"  (1.727 m), weight 97.5 kg, SpO2 99 %. Physical Exam:  General: pleasant, WD, overweight male who is laying in bed in NAD HEENT: head is normocephalic, atraumatic.  Sclera are noninjected.  PERRL.  Ears and nose without any masses or lesions.  Mouth is pink and moist Heart: regular, rate, and rhythm.  Normal s1,s2. No obvious murmurs, gallops, or rubs noted.  Palpable radial and pedal pulses bilaterally Lungs: CTAB, no wheezes, rhonchi, or rales noted.  Respiratory effort nonlabored Abd: soft, NT, ND, +BS, no masses, hernias, or organomegaly GU: R gluteal cleft with induration and ttp  MS: all 4 extremities are symmetrical with no cyanosis, clubbing, or edema. Skin: warm and dry with no masses, lesions, or rashes Neuro: Cranial nerves  2-12 grossly intact, sensation is normal throughout Psych: A&Ox3 with an appropriate affect.   Results for orders placed or performed during the hospital encounter of 08/02/20 (from the past 48 hour(s))  CBG monitoring, ED     Status: Abnormal   Collection Time: 08/02/20  7:04 AM  Result Value Ref Range   Glucose-Capillary 302 (H) 70 - 99 mg/dL    Comment: Glucose reference range applies only to samples taken after fasting for at least 8 hours.   Comment 1 Notify RN   Basic metabolic  panel     Status: Abnormal   Collection Time: 08/02/20  8:30 AM  Result Value Ref Range   Sodium 133 (L) 135 - 145 mmol/L   Potassium 3.6 3.5 - 5.1 mmol/L   Chloride 99 98 - 111 mmol/L   CO2 22 22 - 32 mmol/L   Glucose, Bld 281 (H) 70 - 99 mg/dL    Comment: Glucose reference range applies only to samples taken after fasting for at least 8 hours.   BUN 9 6 - 20 mg/dL   Creatinine, Ser 2.70 0.61 - 1.24 mg/dL   Calcium 9.0 8.9 - 62.3 mg/dL   GFR, Estimated >76 >28 mL/min    Comment: (NOTE) Calculated using the CKD-EPI Creatinine Equation (2021)    Anion gap 12 5 - 15    Comment: Performed at [facility], [address] [place], [place] [address]  CBC with Differential     Status: Abnormal   Collection Time: 08/02/20  8:30 AM  Result Value Ref Range   WBC 14.9 (H) 4.0 - 10.5 K/uL   RBC 4.49 4.22 - 5.81 MIL/uL   Hemoglobin 14.0 13.0 - 17.0 g/dL   HCT 61.6 (L) 07.3 - 71.0 %   MCV 84.6 80.0 - 100.0 fL   MCH 31.2 26.0 - 34.0 pg   MCHC 36.8 (H) 30.0 - 36.0 g/dL   RDW 62.6 94.8 - 54.6 %   Platelets 257 150 - 400 K/uL   nRBC 0.0 0.0 - 0.2 %   Neutrophils Relative % 72 %   Neutro Abs 10.7 (H) 1.7 - 7.7 K/uL   Lymphocytes Relative 14 %   Lymphs Abs 2.1 0.7 - 4.0 K/uL   Monocytes Relative 13 %   Monocytes Absolute 1.9 (H) 0.1 - 1.0 K/uL   Eosinophils Relative 1 %   Eosinophils Absolute 0.1 0.0 - 0.5 K/uL   Basophils Relative 0 %   Basophils Absolute 0.0 0.0 - 0.1 K/uL   Immature Granulocytes 0 %   Abs Immature Granulocytes 0.06 0.00 - 0.07 K/uL    Comment: Performed at [facility], [address], [place], [place] [address]  Urinalysis, Routine w reflex microscopic     Status: Abnormal   Collection Time: 08/02/20 11:34 AM  Result Value Ref Range   Color, Urine AMBER (A) YELLOW    Comment: BIOCHEMICALS MAY BE AFFECTED BY COLOR   APPearance CLEAR CLEAR   Specific Gravity, Urine 1.040 (H) 1.005 - 1.030   pH 6.0 5.0 - 8.0   Glucose, UA >=500  (A) NEGATIVE mg/dL   Hgb urine dipstick NEGATIVE NEGATIVE   Bilirubin Urine NEGATIVE NEGATIVE   Ketones, ur 5 (A) NEGATIVE mg/dL   Protein, ur NEGATIVE NEGATIVE mg/dL   Nitrite NEGATIVE NEGATIVE   Leukocytes,Ua NEGATIVE NEGATIVE   RBC / HPF 0-5 0 - 5 RBC/hpf   WBC, UA 0-5 0 - 5 WBC/hpf   Bacteria, UA NONE SEEN NONE SEEN  Squamous Epithelial / LPF 0-5 0 - 5    Comment: Performed at [facility], [address] [place], [place] [address]   CT PELVIS W CONTRAST  Result Date: 08/02/2020 CLINICAL DATA:  Right buttock abscess. EXAM: CT PELVIS WITH CONTRAST TECHNIQUE: Multidetector CT imaging of the pelvis was performed using the standard protocol following the bolus administration of intravenous contrast. CONTRAST:  OMNIPAQUE IOHEXOL 300 MG/ML  SOLN COMPARISON:  CT abdomen pelvis dated October 22, 2019. FINDINGS: Urinary Tract:  No abnormality visualized. Bowel: Unremarkable visualized pelvic bowel loops. Normal appendix. Vascular/Lymphatic: Enlarged right inguinal lymph node measuring 1.6 cm in short axis, reactive. No significant vascular abnormality seen. Reproductive:  Prostate is unremarkable. Other: 7.4 x 2.3 x 6.3 cm thick walled rim enhancing fluid collection in the medial inferior aspect of the right buttock along the gluteal cleft with overlying skin thickening. Separate and more superior 1.7 x 1.1 x 2.3 cm thick-walled rim enhancing fluid collection in the medial right buttock along the gluteal cleft (series 2, image 45), unchanged since prior CT in March 2021. Musculoskeletal: No acute or significant osseous findings. IMPRESSION: 1. 7.4 cm thick walled rim enhancing fluid collection in the medial inferior aspect of the right buttock along the gluteal cleft, consistent with abscess. 2. Separate and more superior 1.7 cm thick-walled rim enhancing fluid collection in the medial right buttock along the gluteal cleft, unchanged since March. This could be a pilonidal cyst or  small abscess. Electronically Signed   By: Obie Dredge M.D.   On: 08/02/2020 11:43      Assessment/Plan Poorly controlled T2DM HTN HLD Alcohol abuse  Perirectal abscess - CT today shows 7.4 cm thick walled rim enhancing fluid collection in the medial inferior aspect of the right buttock along the gluteal cleft AND Separate and more superior 1.7 cm thick-walled rim enhancing fluid collection in the medial right buttock along the gluteal cleft, unchanged since March - WBC 14.9, afeb here - IV abx ordered - to OR for I&D  Recommend medical admission for poorly controlled diabetes. May want to consider CIWA with hx of alcohol withdrawal although he has not had any alcohol in 6 days so may be out of the window with this. We will follow for wound care.    Juliet Rude, [facility] 08/02/2020, 12:34 PM Please see Amion for pager number during day hours 7:00am-4:30pm

## 2020-08-02 NOTE — H&P (Signed)
History and Physical        Hospital Admission Note Date: 08/02/2020  Patient name: Brandon Mcneil Medical record number: [id number] Date of birth: 1981/04/28 Age: 39 y.o. Gender: male  PCP: Kerin Perna, NP    Chief Complaint    Chief Complaint  Patient presents with  . Abscess      HPI:   This is a 39 year old male with past medical history of hypertension, hyperlipidemia, diabetes, bipolar disorder who presented to the ED for evaluation of an abscess on his right buttock that has been present since Tuesday.  It has been getting larger over the past couple days and has pain when he sits to have a BM.  Has not noted any drainage.  Has had previous abscesses in the past in the same location but have been smaller and have neither drained on their own or have been drained easily in the ED or urgent care.  Denies any known history of Crohn's or ulcerative colitis.  Initially went to [facility] and they checked his blood sugar and it was 500 but he left due to long wait times and took an additional 500 mg of Metformin at home due to his high blood sugar.  Currently he is being seen at bedside postoperatively after I&D with general surgery.  Denies any fever, chills or other complaints.  However, states that blood sugars have been difficult to control recently.  States that he is afraid of his blood sugar dropping below 180.  States that if his blood sugar is 180 at night then he does not take his insulin and will drink a glass of orange juice before bedtime illness circumstance.  He is concerned when his blood sugar is 300+ in the morning and will take an extra dose of Metformin at that time.  We had a discussion regarding his diabetic meds, proper blood sugar control and diet.  Does take his other medications as prescribed.  ED Course: Afebrile, hemodynamically stable, on room air.  Notable Labs: Sodium 133, K3.6, glucose 281, BUN 9, creatinine 0.87, WBC 15, UA-glucose> 500, ketones 5, no bacteria or WBCs. Notable Imaging: CT pelvis with contrast-7.4 cm right buttock abscess and separate more superior 1.7 cm pilonidal cyst or small abscess in the gluteal cleft unchanged since March. Patient received morphine, ceftriaxone, Flagyl, 500 cc NS bolus.    Vitals:   08/02/20 1545 08/02/20 1602  BP: (!) 111/59 132/85  Pulse: 69 73  Resp: 13 17  Temp: 98 F (36.7 C) 98.4 F (36.9 C)  SpO2: 97% 98%     Review of Systems:  Review of Systems  All other systems reviewed and are negative.   Medical/Social/Family History   Past Medical History: Past Medical History:  Diagnosis Date  . Depression   . Diabetes ([place])   . Essential hypertension 02/03/2017  . Hypercholesteremia   . Hypertension     History reviewed. No pertinent surgical history.  Medications: Prior to Admission medications   Medication Sig Start Date End Date Taking? Authorizing Provider  acetaminophen (TYLENOL) 500 MG tablet Take 1,000 mg by mouth every 6 (six) hours as needed for mild pain.   Yes [provider]  amLODipine (NORVASC) 10  MG tablet Take 1 tablet (10 mg total) by mouth daily. 06/19/20  Yes Kerin Perna, NP  aspirin 325 MG EC tablet Take 325 mg by mouth daily.   Yes [provider]  atorvastatin (LIPITOR) 20 MG tablet Take 1 tablet (20 mg total) by mouth daily. 06/19/20  Yes Kerin Perna, NP  glipiZIDE (GLUCOTROL) 10 MG tablet Take 1 tablet (10 mg total) by mouth 2 (two) times daily before a meal. 06/19/20  Yes Kerin Perna, NP  metFORMIN (GLUCOPHAGE) 1000 MG tablet Take 1 tablet (1,000 mg total) by mouth 2 (two) times daily with a meal. Patient taking differently: Take 500-1,000 mg by mouth 2 (two) times daily as needed ("Depending on what my blood sugar is. Just how much I think I need."). 03/06/20  Yes Kerin Perna, NP  nitrofurantoin,  macrocrystal-monohydrate, (MACROBID) 100 MG capsule Take 1 capsule (100 mg total) by mouth 2 (two) times daily. 07/29/20  Yes Sharion Balloon, NP  potassium chloride (KLOR-CON) 10 MEQ tablet Take 10 mEq by mouth daily as needed ("When I feel dehydrated").   Yes [provider]  blood glucose meter kit and supplies KIT Dispense based on patient and insurance preference. Use up to four times daily as directed. (FOR ICD-9 250.00, 250.01). 03/13/20   Vanessa Kick, MD  Blood Glucose Monitoring Suppl (TRUE METRIX METER) DEVI 1 kit by Does not apply route daily. Use as instructed to check blood sugar daily. 08/09/19   Charlott Rakes, MD  glucose blood (TRUE METRIX BLOOD GLUCOSE TEST) test strip Use as instructed to check blood sugar daily. 08/09/19   Charlott Rakes, MD  insulin aspart (NOVOLOG) 100 UNIT/ML injection Inject 6-10 Units into the skin 2 (two) times daily after a meal. Sliding scale Patient not taking: Reported on 08/02/2020 11/30/19   Kerin Perna, NP  insulin glargine (LANTUS SOLOSTAR) 100 UNIT/ML Solostar Pen Inject 20 Units into the skin daily. Patient not taking: No sig reported 06/19/20   Kerin Perna, NP  losartan (COZAAR) 100 MG tablet Take 0.5 tablets (50 mg total) by mouth daily. Patient not taking: No sig reported 06/19/20   Kerin Perna, NP  omeprazole (PRILOSEC) 20 MG capsule Take 1 capsule (20 mg total) by mouth daily. Patient not taking: No sig reported 02/11/20   Tegeler, Gwenyth Allegra, MD  TRUEplus Lancets 28G MISC Use as instructed to check blood sugar daily. 08/09/19   Charlott Rakes, MD  losartan-hydrochlorothiazide (HYZAAR) 100-25 MG tablet Take 1 tablet by mouth daily. 05/10/19 07/07/19  Kerin Perna, NP    Allergies:   Allergies  Allergen Reactions  . Lisinopril Swelling and Other (See Comments)    Lips and mouth became swollen  . Penicillins Itching, Swelling and Rash    Social History:  reports that he has been smoking  cigarettes. He has a 7.00 pack-year smoking history. He has never used smokeless tobacco. He reports current alcohol use of about 2.0 standard drinks of alcohol per week. He reports that he does not use drugs.  Family History: Family History  Problem Relation Age of Onset  . Healthy Mother   . Healthy Father      Objective   Physical Exam: Blood pressure 132/85, pulse 73, temperature 98.4 F (36.9 C), temperature source Oral, resp. rate 17, height '5\' 8"'  (1.727 m), weight 97.5 kg, SpO2 98 %.  Physical Exam Vitals and nursing note reviewed.  Constitutional:      Appearance: Normal appearance.  HENT:  Head: Normocephalic and atraumatic.  Eyes:     Conjunctiva/sclera: Conjunctivae normal.  Cardiovascular:     Rate and Rhythm: Normal rate and regular rhythm.  Pulmonary:     Effort: Pulmonary effort is normal.     Breath sounds: Normal breath sounds.  Abdominal:     General: Abdomen is flat.     Palpations: Abdomen is soft.  Musculoskeletal:        General: No swelling or tenderness.  Skin:    Coloration: Skin is not jaundiced or pale.  Neurological:     Mental Status: He is alert. Mental status is at baseline.  Psychiatric:        Mood and Affect: Mood normal.        Behavior: Behavior normal.     LABS on Admission: I have personally reviewed all the labs and imaging below    Basic Metabolic Panel: Recent Labs  Lab 08/01/20 1718 08/02/20 0830  NA 134* 133*  K 4.1 3.6  CL 99 99  CO2 22 22  GLUCOSE 363* 281*  BUN 11 9  CREATININE 0.98 0.87  CALCIUM 9.7 9.0   Liver Function Tests: No results for input(s): AST, ALT, ALKPHOS, BILITOT, PROT, ALBUMIN in the last 168 hours. No results for input(s): LIPASE, AMYLASE in the last 168 hours. No results for input(s): AMMONIA in the last 168 hours. CBC: Recent Labs  Lab 08/01/20 1718 08/02/20 0830  WBC 14.4* 14.9*  NEUTROABS  --  10.7*  HGB 15.2 14.0  HCT 41.2 38.0*  MCV 84.9 84.6  PLT 261 257   Cardiac  Enzymes: No results for input(s): CKTOTAL, CKMB, CKMBINDEX, TROPONINI in the last 168 hours. BNP: Invalid input(s): POCBNP CBG: Recent Labs  Lab 08/02/20 1340 08/02/20 1521  GLUCAP 271* 272*    Radiological Exams on Admission:  CT PELVIS W CONTRAST  Result Date: 08/02/2020 CLINICAL DATA:  Right buttock abscess. EXAM: CT PELVIS WITH CONTRAST TECHNIQUE: Multidetector CT imaging of the pelvis was performed using the standard protocol following the bolus administration of intravenous contrast. CONTRAST:  152m OMNIPAQUE IOHEXOL 300 MG/ML  SOLN COMPARISON:  CT abdomen pelvis dated October 22, 2019. FINDINGS: Urinary Tract:  No abnormality visualized. Bowel: Unremarkable visualized pelvic bowel loops. Normal appendix. Vascular/Lymphatic: Enlarged right inguinal lymph node measuring 1.6 cm in short axis, reactive. No significant vascular abnormality seen. Reproductive:  Prostate is unremarkable. Other: 7.4 x 2.3 x 6.3 cm thick walled rim enhancing fluid collection in the medial inferior aspect of the right buttock along the gluteal cleft with overlying skin thickening. Separate and more superior 1.7 x 1.1 x 2.3 cm thick-walled rim enhancing fluid collection in the medial right buttock along the gluteal cleft (series 2, image 45), unchanged since prior CT in March 2021. Musculoskeletal: No acute or significant osseous findings. IMPRESSION: 1. 7.4 cm thick walled rim enhancing fluid collection in the medial inferior aspect of the right buttock along the gluteal cleft, consistent with abscess. 2. Separate and more superior 1.7 cm thick-walled rim enhancing fluid collection in the medial right buttock along the gluteal cleft, unchanged since March. This could be a pilonidal cyst or small abscess. Electronically Signed   By: WTitus DubinM.D.   On: 08/02/2020 11:43      EKG: Not done   A & P   Principal Problem:   Abscess Active Problems:   Essential hypertension   Type 2 diabetes mellitus with  hyperlipidemia ([place]   1. Perirectal abscess s/p I&D by Dr. BBarry Dienes POD  0 a. Afebrile with leukocytosis of 14.9 b. Follow-up wound cultures c. Needs better blood sugar control d. Since he is afebrile and his post I&D, will change from ceftriaxone IV to p.o. antibiotics and add MRSA coverage.  Changed to Bactrim  2. Poorly controlled type 2 diabetes a. Has a poor understanding of his underlying diabetes and medication regimen b. Education was provided at bedside however he needs extensive education.  Will consult diabetic coordinator and dietitian c. Needs financial assistance with medications.  Will consult TOC team  3. Hypertension a. Continue home amlodipine, holding losartan  4. Hyperlipidemia a. Continue statin b. Unclear why he is on high-dose aspirin.  Recommend looking into this further during admission or outpatient  DVT prophylaxis: Lovenox   Code Status: Full Code  Diet: Heart healthy/carb modified Family Communication: Admission, patients condition and plan of care including tests being ordered have been discussed with the patient who indicates understanding and agrees with the plan and Code Status. Disposition Plan: The appropriate patient status for this patient is INPATIENT. Inpatient status is judged to be reasonable and necessary in order to provide the required intensity of service to ensure the patient's safety. The patient's presenting symptoms, physical exam findings, and initial radiographic and laboratory data in the context of their chronic comorbidities is felt to place them at high risk for further clinical deterioration. Furthermore, it is not anticipated that the patient will be medically stable for discharge from the hospital within 2 midnights of admission. The following factors support the patient status of inpatient.   " The patient's presenting symptoms include perirectal abscess. " The worrisome physical exam findings poor insight. " The initial  radiographic and laboratory data are worrisome because of leukocytosis. " The chronic co-morbidities include diabetes, hypertension.   * I certify that at the point of admission it is my clinical judgment that the patient will require inpatient hospital care spanning beyond 2 midnights from the point of admission due to high intensity of service, high risk for further deterioration and high frequency of surveillance required.*   Status is: Inpatient  Remains inpatient appropriate because:Inpatient level of care appropriate due to severity of illness   Dispo: The patient is from: Home              Anticipated d/c is to: Home              Anticipated d/c date is: 1 day              Patient currently is not medically stable to d/c.      Consultants  . Surgery  Procedures  . I&D  Time Spent on Admission: 63 minutes    Harold Hedge, DO [facility]  08/02/2020, 5:04 PM

## 2020-08-02 NOTE — ED Triage Notes (Signed)
Pt reports abscess on right lower buttock worsening since Tuesday. Pt seen at [facility] where cbg was 500. Pt went home and administered 1/2 metformin pill.

## 2020-08-02 NOTE — Anesthesia Postprocedure Evaluation (Signed)
Anesthesia Post Note  Patient: Brandon Mcneil  Procedure(s) Performed: IRRIGATION AND DEBRIDEMENT PERIRECTAL ABSCESS (N/A )     Patient location during evaluation: PACU Anesthesia Type: General Level of consciousness: sedated Pain management: pain level controlled Vital Signs Assessment: post-procedure vital signs reviewed and stable Respiratory status: spontaneous breathing and respiratory function stable Cardiovascular status: stable Postop Assessment: no apparent nausea or vomiting Anesthetic complications: no   No complications documented.  Last Vitals:  Vitals:   08/02/20 1530 08/02/20 1545  BP: (!) 143/82 (!) 111/59  Pulse: 70 69  Resp: 14 13  Temp:  36.7 C  SpO2: 96% 97%    Last Pain:  Vitals:   08/02/20 1545  TempSrc:   PainSc: 0-No pain                 [name]

## 2020-08-02 NOTE — Op Note (Signed)
Incision and Drainage perirectal abscess  Pre-operative Diagnosis: right posterior perirectal abscess   Post-operative Diagnosis: same   Anesthesia: General   Procedure Details  The procedure, risks and complications have been discussed in detail (including, infection, bleeding, need for additional procedures) with the patient, and the patient has signed consent to the procedure. The patient was informed that the wound would be left open.  The patient was taken to OR 5 and general anesthesia was induced. The patient was placed into lithotomy position.  The skin was sterilely prepped and draped over the affected area in the usual fashion. Time out was performed according to the surgical safety checklist.   I&D with a #11 blade was performed on the right lateral perirectal region.  Cultures were sent. Additional skin was debrided around the opening. Copious volume of purulent drainage was present. The pulse lavage was used to irrigate the cavity. The wound was packed with 1" NuGauze.  The patient was awakened from anesthesia and taken to the PACU in stable condition.   Findings:  Copious amounts of foul smelling pus.  Specimens:  Cultures sent to microbiology  EBL: min cc's   Drains: None   Condition: Tolerated procedure well   Complications:  none known.

## 2020-08-02 NOTE — Anesthesia Preprocedure Evaluation (Addendum)
Anesthesia Evaluation  Patient identified by MRN, date of birth, ID band Patient awake    Reviewed: Allergy & Precautions, NPO status , Patient's Chart, lab work & pertinent test results  History of Anesthesia Complications Negative for: history of anesthetic complications  Airway Mallampati: II  TM Distance: >3 FB Neck ROM: Full    Dental no notable dental hx. (+) Dental Advisory Given   Pulmonary neg pulmonary ROS, Current Smoker and Patient abstained from smoking.,    Pulmonary exam normal        Cardiovascular hypertension, Pt. on medications Normal cardiovascular exam     Neuro/Psych negative neurological ROS     GI/Hepatic negative GI ROS, Neg liver ROS,   Endo/Other  diabetes  Renal/GU negative Renal ROS     Musculoskeletal negative musculoskeletal ROS (+)   Abdominal   Peds  Hematology negative hematology ROS (+)   Anesthesia Other Findings   Reproductive/Obstetrics                            Anesthesia Physical Anesthesia Plan  ASA: III and emergent  Anesthesia Plan: General   Post-op Pain Management:    Induction: Intravenous  PONV Risk Score and Plan: Ondansetron, Dexamethasone and Midazolam  Airway Management Planned: LMA  Additional Equipment:   Intra-op Plan:   Post-operative Plan: Extubation in OR  Informed Consent: I have reviewed the patients History and Physical, chart, labs and discussed the procedure including the risks, benefits and alternatives for the proposed anesthesia with the patient or authorized representative who has indicated his/her understanding and acceptance.     Dental advisory given  Plan Discussed with: Anesthesiologist and CRNA  Anesthesia Plan Comments:        Anesthesia Quick Evaluation

## 2020-08-02 NOTE — ED Notes (Signed)
Pt ambulatory to bathroom, no assistance needed.  

## 2020-08-02 NOTE — Anesthesia Procedure Notes (Signed)
Procedure Name: LMA Insertion Date/Time: 08/02/2020 2:10 PM Performed by: Vanessa Coyote, CRNA Pre-anesthesia Checklist: Emergency Drugs available, Patient identified, Suction available and Patient being monitored Patient Re-evaluated:Patient Re-evaluated prior to induction Oxygen Delivery Method: Circle system utilized Preoxygenation: Pre-oxygenation with 100% oxygen Induction Type: IV induction Ventilation: Mask ventilation without difficulty LMA: LMA with gastric port inserted LMA Size: 4.0 Number of attempts: 1 Placement Confirmation: positive ETCO2 and breath sounds checked- equal and bilateral Tube secured with: Tape Dental Injury: Teeth and Oropharynx as per pre-operative assessment

## 2020-08-02 NOTE — ED Notes (Signed)
Pt left due to wait times 

## 2020-08-03 ENCOUNTER — Encounter (HOSPITAL_COMMUNITY): Payer: Self-pay | Admitting: General Surgery

## 2020-08-03 LAB — CBC
HCT: 35.2 % — ABNORMAL LOW (ref 39.0–52.0)
Hemoglobin: 12.7 g/dL — ABNORMAL LOW (ref 13.0–17.0)
MCH: 30.9 pg (ref 26.0–34.0)
MCHC: 36.1 g/dL — ABNORMAL HIGH (ref 30.0–36.0)
MCV: 85.6 fL (ref 80.0–100.0)
Platelets: 231 10*3/uL (ref 150–400)
RBC: 4.11 MIL/uL — ABNORMAL LOW (ref 4.22–5.81)
RDW: 12.1 % (ref 11.5–15.5)
WBC: 12.9 10*3/uL — ABNORMAL HIGH (ref 4.0–10.5)
nRBC: 0 % (ref 0.0–0.2)

## 2020-08-03 LAB — BASIC METABOLIC PANEL
Anion gap: 9 (ref 5–15)
BUN: 13 mg/dL (ref 6–20)
CO2: 23 mmol/L (ref 22–32)
Calcium: 8.8 mg/dL — ABNORMAL LOW (ref 8.9–10.3)
Chloride: 102 mmol/L (ref 98–111)
Creatinine, Ser: 0.9 mg/dL (ref 0.61–1.24)
GFR, Estimated: >60 mL/min (ref >60)
Glucose, Bld: 224 mg/dL — ABNORMAL HIGH (ref 70–99)
Potassium: 3.6 mmol/L (ref 3.5–5.1)
Sodium: 134 mmol/L — ABNORMAL LOW (ref 135–145)

## 2020-08-03 LAB — GLUCOSE, CAPILLARY
Glucose-Capillary: 195 mg/dL — ABNORMAL HIGH (ref 70–99)
Glucose-Capillary: 202 mg/dL — ABNORMAL HIGH (ref 70–99)

## 2020-08-03 LAB — HIV ANTIBODY (ROUTINE TESTING W REFLEX): HIV Screen 4th Generation wRfx: NONREACTIVE

## 2020-08-03 MED ORDER — SULFAMETHOXAZOLE-TRIMETHOPRIM 800-160 MG PO TABS: 2.0000 | ORAL_TABLET | Freq: Two times a day (BID) | ORAL | 0 refills | Status: AC

## 2020-08-03 NOTE — TOC Initial Note (Signed)
Transition of Care [facility]) - Initial/Assessment Note    Patient Details  Name: Brandon Mcneil MRN: [id number] Date of Birth: October 05, 1980  [facility] CM/SW Contact:    Armanda Heritage, RN Phone Number: 08/03/2020, 2:52 PM  Clinical Narrative:                 CM spoke with patient regarding difficulty with affording medications.  Patient reports he is followed at the [facility] clinic and was advised that the cheapest place for him to get his insulin (Novolog/Lantus) is the [facility] but he did not want to use that pharmacy due to transportation concerns.  CM spoke with patient and explained the cheapest place for Lantus/ Novolog is the [facility], however if patient is a candidate to use the Reli-on 70/30 insulin that can be obtained at [facility] for 25$.  Patient has an appointment with pcp on Monday and will discuss insulin options then, either switch to Reli-on 70/30 or use the [facility] for lantus/Novolog.  Patient reports he has insulin left at home to last until after his pcp appointment.  Expected Discharge Plan: Home/Self Care Barriers to Discharge: No Barriers Identified   Patient Goals and CMS Choice Patient states their goals for this hospitalization and ongoing recovery are:: to go home      Expected Discharge Plan and Services Expected Discharge Plan: Home/Self Care   Discharge Planning Services: CM Consult   Living arrangements for the past 2 months: Apartment Expected Discharge Date: 08/03/20               DME Arranged: N/A DME Agency: NA                  Prior Living Arrangements/Services Living arrangements for the past 2 months: Apartment   Patient language and need for interpreter reviewed:: Yes Do you feel safe going back to the place where you live?: Yes      Need for Family Participation in Patient Care: Yes (Comment) Care giver support system in place?: Yes (comment)   Criminal Activity/Legal Involvement Pertinent to Current  Situation/Hospitalization: No - Comment as needed  Activities of Daily Living Home Assistive Devices/Equipment: None ADL Screening (condition at time of admission) Patient's cognitive ability adequate to safely complete daily activities?: No Is the patient deaf or have difficulty hearing?: No Does the patient have difficulty seeing, even when wearing glasses/contacts?: No Does the patient have difficulty concentrating, remembering, or making decisions?: No Patient able to express need for assistance with ADLs?: Yes Does the patient have difficulty dressing or bathing?: No Independently performs ADLs?: Yes (appropriate for developmental age) Does the patient have difficulty walking or climbing stairs?: No Weakness of Legs: None Weakness of Arms/Hands: None  Permission Sought/Granted                  Emotional Assessment Appearance:: Appears stated age Attitude/Demeanor/Rapport: Engaged Affect (typically observed): Accepting Orientation: : Oriented to Self,Oriented to Place,Oriented to  Time,Oriented to Situation   Psych Involvement: No (comment)  Admission diagnosis:  Abscess [L02.91] Hyperglycemia [R73.9] Abscess of buttock, right [L02.31] Patient Active Problem List   Diagnosis Date Noted  . Abscess 08/02/2020  . Stress 02/08/2017  . Essential hypertension 02/03/2017  . Type 2 diabetes mellitus with hyperlipidemia (HCC) 02/03/2017  . Medication overdose 10/31/2013  . Bipolar I disorder, most recent episode depressed (HCC) 09/11/2013  . Tobacco use disorder 09/11/2013  . Newly diagnosed diabetes (HCC) 09/11/2013  . Dehydration 09/11/2013  . Pain, dental 09/11/2013  .  DKA (diabetic ketoacidoses) 09/10/2013   PCP:  Grayce Sessions, NP Pharmacy:   [facility] [address] [place]), [place] - [address] [place] [address] Phone: [phone] Fax: [phone]  [facility] [place], [place] - [place] [address] [place] [address] Phone: [phone] Fax: [phone]     Social Determinants of Health (SDOH) Interventions    Readmission Risk Interventions No flowsheet data found.

## 2020-08-03 NOTE — Progress Notes (Signed)
1 Day Post-Op   Subjective/Chief Complaint: Doing well no complaints   Objective: Vital signs in last 24 hours: Temp:  [98 F (36.7 C)-98.8 F (37.1 C)] 98.4 F (36.9 C) (12/18 0937) Pulse Rate:  [66-83] 68 (12/18 0937) Resp:  [13-18] 16 (12/18 0937) BP: (111-143)/(59-112) 133/81 (12/18 0937) SpO2:  [96 %-100 %] 99 % (12/18 0937) Weight:  [97.5 kg] 97.5 kg (12/17 1332) Last BM Date: 08/02/20  Intake/Output from previous day: 12/17 0701 - 12/18 0700 In: 2460 [P.O.:960; I.V.:800; IV Piggyback:700] Out: 25 [Blood:25] Intake/Output this shift: Total I/O In: 360 [P.O.:360] Out: -   Perianal/perirectal wound: Packing in place.  No significant active bleeding.  Skin appears normal.  No foul-smelling drainage.  Lab Results:  Recent Labs    08/02/20 0830 08/03/20 0524  WBC 14.9* 12.9*  HGB 14.0 12.7*  HCT 38.0* 35.2*  PLT 257 231   BMET Recent Labs    08/02/20 0830 08/03/20 0524  NA 133* 134*  K 3.6 3.6  CL 99 102  CO2 22 23  GLUCOSE 281* 224*  BUN 9 13  CREATININE 0.87 0.90  CALCIUM 9.0 8.8*   PT/INR No results for input(s): LABPROT, INR in the last 72 hours. ABG No results for input(s): PHART, HCO3 in the last 72 hours.  Invalid input(s): PCO2, PO2  Studies/Results: CT PELVIS W CONTRAST  Result Date: 08/02/2020 CLINICAL DATA:  Right buttock abscess. EXAM: CT PELVIS WITH CONTRAST TECHNIQUE: Multidetector CT imaging of the pelvis was performed using the standard protocol following the bolus administration of intravenous contrast. CONTRAST:  OMNIPAQUE IOHEXOL 300 MG/ML  SOLN COMPARISON:  CT abdomen pelvis dated October 22, 2019. FINDINGS: Urinary Tract:  No abnormality visualized. Bowel: Unremarkable visualized pelvic bowel loops. Normal appendix. Vascular/Lymphatic: Enlarged right inguinal lymph node measuring 1.6 cm in short axis, reactive. No significant vascular abnormality seen. Reproductive:  Prostate is unremarkable. Other: 7.4 x 2.3 x 6.3 cm thick  walled rim enhancing fluid collection in the medial inferior aspect of the right buttock along the gluteal cleft with overlying skin thickening. Separate and more superior 1.7 x 1.1 x 2.3 cm thick-walled rim enhancing fluid collection in the medial right buttock along the gluteal cleft (series 2, image 45), unchanged since prior CT in March 2021. Musculoskeletal: No acute or significant osseous findings. IMPRESSION: 1. 7.4 cm thick walled rim enhancing fluid collection in the medial inferior aspect of the right buttock along the gluteal cleft, consistent with abscess. 2. Separate and more superior 1.7 cm thick-walled rim enhancing fluid collection in the medial right buttock along the gluteal cleft, unchanged since March. This could be a pilonidal cyst or small abscess. Electronically Signed   By: Obie Dredge M.D.   On: 08/02/2020 11:43    Anti-infectives: Anti-infectives (From admission, onward)   Start     Dose/Rate Route Frequency Ordered Stop   08/03/20 1400  cefTRIAXone (ROCEPHIN) 2 g in sodium chloride 0.9 % 100 mL IVPB  Status:  Discontinued        2 g 200 mL/hr over 30 Minutes Intravenous Every 24 hours 08/02/20 1616 08/02/20 1725   08/03/20 0000  cefTRIAXone (ROCEPHIN) 2 g in sodium chloride 0.9 % 100 mL IVPB  Status:  Discontinued        2 g 200 mL/hr over 30 Minutes Intravenous  Once 08/02/20 1612 08/02/20 1616   08/02/20 2200  metroNIDAZOLE (FLAGYL) tablet 500 mg  Status:  Discontinued        500 mg Oral Every  12 hours 08/02/20 1605 08/02/20 1616   08/02/20 2200  metroNIDAZOLE (FLAGYL) tablet 500 mg  Status:  Discontinued        500 mg Oral Every 8 hours 08/02/20 1616 08/02/20 1700   08/02/20 1800  sulfamethoxazole-trimethoprim (BACTRIM DS) 800-160 MG per tablet 2 tablet        2 tablet Oral Every 12 hours 08/02/20 1700     08/02/20 1700  cefTRIAXone (ROCEPHIN) 1 g in sodium chloride 0.9 % 100 mL IVPB  Status:  Discontinued        1 g 200 mL/hr over 30 Minutes Intravenous  Once  08/02/20 1616 08/02/20 1739   08/02/20 1245  cefTRIAXone (ROCEPHIN) 1 g in sodium chloride 0.9 % 100 mL IVPB        1 g 200 mL/hr over 30 Minutes Intravenous  Once 08/02/20 1244 08/02/20 1414   08/02/20 1245  metroNIDAZOLE (FLAGYL) IVPB 500 mg        500 mg 100 mL/hr over 60 Minutes Intravenous  Once 08/02/20 1244 08/02/20 1425      Assessment/Plan: s/p Procedure(s): IRRIGATION AND DEBRIDEMENT PERIRECTAL ABSCESS (N/A) Remove packing tomorrow.  Recommend sitz bath tomorrow to help that process.  Can be discharged anytime.  If he goes home today, recommend soaking in a tub of warm water and removing the packing tomorrow morning.  Follow-up 3 weeks with the doctor the week clinic to see our PA Puja.  LOS: 1 day    Dortha Schwalbe MD  08/03/2020

## 2020-08-03 NOTE — Plan of Care (Signed)
Nutrition Education Note  RD consulted for nutrition education regarding diabetes.  Lab Results  Component Value Date   HGBA1C 8.7 (A) 06/19/2020  RD working remotely.   RD spoke with patient via phone this morning. He reports feeling better today and endorses 100% po of breakfast (eggs, pancakes, sausage). Patient reports having a good appetite at baseline, eats 3 meals/day. First meal after 12, recalls a [place] sandwich or bowl of cereal (Cinnamon Toast Crunch) with skim milk, he eats [employer] grilled chicken sandwich with fries and a water for lunch if out, and if at home he will usually eat another sandwich, recalls variety of dinner meals (spaghetti, baked chicken, cabbage, mac and cheese). He does not drink sodas, recalls 5-6 bottles of water daily.   RD provided "Heart Healthy, Consistent Carbohydrate Nutrition Therapy" handout from the Academy of Nutrition and Dietetics. Handout has been attached to discharge instructions as well as link for American Diabetes Association. Reviewed patient's dietary recall. Discussed different food groups and their effects on blood sugar, emphasizing carbohydrate-containing foods. Provided list of carbohydrates and recommended serving sizes of common foods. Provided examples on ways to decrease sodium intake in diet. Discouraged intake of processed foods and use of salt shaker. Encouraged fresh fruits and vegetables as well as whole grain sources of carbohydrates to maximize fiber intake.   Discussed importance of controlled and consistent carbohydrate intake throughout the day. Provided examples of ways to balance meals/snacks and encouraged intake of high-fiber, whole grain complex carbohydrates. Teach back method used.  Expect good compliance.  Body mass index is 32.69 kg/m. Pt meets criteria for overweight based on current BMI.  Current diet order is HH/CM, patient is consuming approximately 100% of meals at this time. Labs and medications reviewed. No  further nutrition interventions warranted at this time. RD contact information provided. If additional nutrition issues arise, please re-consult RD.   Lajuan Lines, RD, LDN Clinical Nutrition After Hours/Weekend Pager # in [place]

## 2020-08-03 NOTE — Progress Notes (Signed)
Assessment unchanged. Pt verbalized understanding of dc instructions including meds, follow up care, and when to call doctor. Verbalized back to nurse how to set up sitz bath at home. Discharged via wc accompanied by Nt.

## 2020-08-03 NOTE — Discharge Summary (Signed)
Physician Discharge Summary  Brandon Mcneil UEK:[id number] DOB: 01-23-1981 DOA: 08/02/2020  PCP: Kerin Perna, NP  Admit date: 08/02/2020   Discharge date: 08/03/2020  Admitted From: Home. Disposition:  Home  Recommendations for Outpatient Follow-up:  1. Follow up with PCP in 1-2 weeks. 2. Please obtain BMP/CBC in one week. 3. Advised sitz bath in warm water.  Remove packing tomorrow morning.   4. Advised to follow-up with general surgery in 3 weeks.   5. Advised to take Bactrim DS twice a day for 10 days.   Home Health: None Equipment/Devices: None  Discharge Condition: Good CODE STATUS:Full code Diet recommendation: Heart Healthy   Brief [facility] course: This 39 year old Male with PMH of hypertension, hyperlipidemia, diabetes, bipolar disorder presents in the ED for the evaluation of abscess on his right buttock that he noticed for 3 days which has been progressively getting bigger associated with pain, denies any drainage.  Patient reports recurrent abscess at the same location in the past. CT pelvis with contrast- showed 7.4 cm right buttock abscess and separate more superior 1.7 cm pilonidal cyst or small abscess in the gluteal cleft unchanged since March.   General surgery was consulted by ED physician.  Patient underwent incision and drainage.  Patient was started on Bactrim DS.  Patient tolerated well.  Patient was seen by general surgery next day recommended Patient can be discharged home,  advised sitz bath in the warm water,  remove packing next day. Patient reports feeling much better and wants to be discharged home.  Diabetic education was completed,  Patient was explained in detail that he should continue taking his diabetes medications and follow-up with the primary care physician as scheduled.  He was managed for below problems.  Discharge Diagnoses:  Principal Problem:   Abscess Active Problems:   Essential hypertension   Type 2 diabetes  mellitus with hyperlipidemia ([place])   1. Perirectal abscess s/p I&D by Dr. Barry Dienes, POD 0 a. Afebrile with leukocytosis of 14.9 b. Follow-up wound cultures c. Needs better blood sugar control d. Since he is afebrile and his post I&D, will change from ceftriaxone IV to p.o. antibiotics and add MRSA coverage.  Changed to Bactrim e. Patient was seen by general surgery next day recommended patient can be discharged. f. Advised sitz bath in November water, remove packing next morning. g. Patient feels better and wants to be discharged home.  2. Poorly controlled type 2 diabetes a. Has a poor understanding of his underlying diabetes and medication regimen b. Education was provided at bedside however he needs extensive education.    Diabetic education completed by diabetic coordinator.   3. Hypertension a. Continue home amlodipine, holding losartan  4. Hyperlipidemia a. Continue statin b. Unclear why he is on high-dose aspirin.   c.    Discharge Instructions  Discharge Instructions    Call MD for:  persistant dizziness or light-headedness   Complete by: As directed    Call MD for:  persistant nausea and vomiting   Complete by: As directed    Call MD for:  redness, tenderness, or signs of infection (pain, swelling, redness, odor or green/yellow discharge around incision site)   Complete by: As directed    Call MD for:  temperature >100.4   Complete by: As directed    Diet - low sodium heart healthy   Complete by: As directed    Diet Carb Modified   Complete by: As directed    Discharge instructions   Complete by:  As directed    Advised to follow-up with primary lesion in 1 week.  Advised sitz bath and warm water.  Remove packing tomorrow morning.  Advised to follow-up with general surgery in 3 weeks.  Advised to take Bactrim DS twice a day for 10 days.   Discharge wound care:   Complete by: As directed    Remove packing tomorrow.   Increase activity slowly   Complete by: As  directed      Allergies as of 08/03/2020      Reactions   Lisinopril Swelling, Other (See Comments)   Lips and mouth became swollen   Penicillins Itching, Swelling, Rash      Medication List    STOP taking these medications   losartan 100 MG tablet Commonly known as: COZAAR   nitrofurantoin (macrocrystal-monohydrate) 100 MG capsule Commonly known as: MACROBID   omeprazole 20 MG capsule Commonly known as: PRILOSEC     TAKE these medications   acetaminophen 500 MG tablet Commonly known as: TYLENOL Take 1,000 mg by mouth every 6 (six) hours as needed for mild pain.   amLODipine 10 MG tablet Commonly known as: NORVASC Take 1 tablet (10 mg total) by mouth daily.   aspirin 325 MG EC tablet Take 325 mg by mouth daily.   atorvastatin 20 MG tablet Commonly known as: LIPITOR Take 1 tablet (20 mg total) by mouth daily.   blood glucose meter kit and supplies Kit Dispense based on patient and insurance preference. Use up to four times daily as directed. (FOR ICD-9 250.00, 250.01).   glipiZIDE 10 MG tablet Commonly known as: GLUCOTROL Take 1 tablet (10 mg total) by mouth 2 (two) times daily before a meal.   insulin aspart 100 UNIT/ML injection Commonly known as: novoLOG Inject 6-10 Units into the skin 2 (two) times daily after a meal. Sliding scale   Lantus SoloStar 100 UNIT/ML Solostar Pen Generic drug: insulin glargine Inject 20 Units into the skin daily.   metFORMIN 1000 MG tablet Commonly known as: GLUCOPHAGE Take 1 tablet (1,000 mg total) by mouth 2 (two) times daily with a meal. What changed:   how much to take  when to take this  reasons to take this   potassium chloride 10 MEQ tablet Commonly known as: KLOR-CON Take 10 mEq by mouth daily as needed ("When I feel dehydrated").   sulfamethoxazole-trimethoprim 800-160 MG tablet Commonly known as: BACTRIM DS Take 2 tablets by mouth every 12 (twelve) hours for 10 days.   True Metrix Blood Glucose Test  test strip Generic drug: glucose blood Use as instructed to check blood sugar daily.   True Metrix Meter Devi 1 kit by Does not apply route daily. Use as instructed to check blood sugar daily.   TRUEplus Lancets 28G Misc Use as instructed to check blood sugar daily.            Discharge Care Instructions  (From admission, onward)         Start     Ordered   08/03/20 0000  Discharge wound care:       Comments: Remove packing tomorrow.   08/03/20 1237          Follow-up Information    Surgery, [place]. Go on 08/13/2020.   Specialty: General Surgery Why: Follow up appointment scheduled for 11:30 AM. Please arrive 30 min prior to appointment time. Bring photo ID and insurance information.  Contact information: [place] [address] [phone]  Kerin Perna, NP .   Specialty: Internal Medicine Contact information: [address] [address] [phone]              Allergies  Allergen Reactions  . Lisinopril Swelling and Other (See Comments)    Lips and mouth became swollen  . Penicillins Itching, Swelling and Rash    Consultations:  General surgery   Procedures/Studies: CT PELVIS W CONTRAST  Result Date: 08/02/2020 CLINICAL DATA:  Right buttock abscess. EXAM: CT PELVIS WITH CONTRAST TECHNIQUE: Multidetector CT imaging of the pelvis was performed using the standard protocol following the bolus administration of intravenous contrast. CONTRAST:  169m OMNIPAQUE IOHEXOL 300 MG/ML  SOLN COMPARISON:  CT abdomen pelvis dated October 22, 2019. FINDINGS: Urinary Tract:  No abnormality visualized. Bowel: Unremarkable visualized pelvic bowel loops. Normal appendix. Vascular/Lymphatic: Enlarged right inguinal lymph node measuring 1.6 cm in short axis, reactive. No significant vascular abnormality seen. Reproductive:  Prostate is unremarkable. Other: 7.4 x 2.3 x 6.3 cm thick walled rim enhancing fluid  collection in the medial inferior aspect of the right buttock along the gluteal cleft with overlying skin thickening. Separate and more superior 1.7 x 1.1 x 2.3 cm thick-walled rim enhancing fluid collection in the medial right buttock along the gluteal cleft (series 2, image 45), unchanged since prior CT in March 2021. Musculoskeletal: No acute or significant osseous findings. IMPRESSION: 1. 7.4 cm thick walled rim enhancing fluid collection in the medial inferior aspect of the right buttock along the gluteal cleft, consistent with abscess. 2. Separate and more superior 1.7 cm thick-walled rim enhancing fluid collection in the medial right buttock along the gluteal cleft, unchanged since March. This could be a pilonidal cyst or small abscess. Electronically Signed   By: WTitus DubinM.D.   On: 08/02/2020 11:43    Incision and  drainage of the perirectal abscess.   Subjective: Patient was seen and examined at bedside.  Overnight events noted.  Patient reports feeling much better.  Pain has improved.  Patient was cleared from general surgery to be discharged.  Patient wants to go home,  advised sitz bath in the warm water.  Discharge Exam: Vitals:   08/03/20 0455 08/03/20 0937  BP: (!) 136/92 133/81  Pulse: 66 68  Resp: 18 16  Temp: 98 F (36.7 C) 98.4 F (36.9 C)  SpO2: 98% 99%   Vitals:   08/02/20 1918 08/03/20 0021 08/03/20 0455 08/03/20 0937  BP: 115/79 (!) 134/98 (!) 136/92 133/81  Pulse: 74 75 66 68  Resp: '18 16 18 16  ' Temp: 98.2 F (36.8 C) 98.1 F (36.7 C) 98 F (36.7 C) 98.4 F (36.9 C)  TempSrc: Oral   Oral  SpO2: 96% 99% 98% 99%  Weight:      Height:        General: Pt is alert, awake, not in acute distress Cardiovascular: RRR, S1/S2 +, no rubs, no gallops Respiratory: CTA bilaterally, no wheezing, no rhonchi Abdominal: Soft, NT, ND, bowel sounds + Extremities: no edema, no cyanosis    The results of significant diagnostics from this hospitalization (including  imaging, microbiology, ancillary and laboratory) are listed below for reference.     Microbiology: Recent Results (from the past 240 hour(s))  Urine culture     Status: Abnormal   Collection Time: 07/29/20  6:35 PM   Specimen: Urine, Random  Result Value Ref Range Status   Specimen Description URINE, RANDOM  Final   Special Requests   Final  NONE Performed at [facility], [place] [address], [place], [place] [address]    Culture 90,000 COLONIES/mL ESCHERICHIA COLI (A)  Final   Report Status 07/31/2020 FINAL  Final   Organism ID, Bacteria ESCHERICHIA COLI (A)  Final      Susceptibility   Escherichia coli - MIC*    AMPICILLIN <=2 SENSITIVE Sensitive     CEFAZOLIN <=4 SENSITIVE Sensitive     CEFEPIME <=0.12 SENSITIVE Sensitive     CEFTRIAXONE <=0.25 SENSITIVE Sensitive     CIPROFLOXACIN <=0.25 SENSITIVE Sensitive     GENTAMICIN 2 SENSITIVE Sensitive     IMIPENEM <=0.25 SENSITIVE Sensitive     NITROFURANTOIN <=16 SENSITIVE Sensitive     TRIMETH/SULFA <=20 SENSITIVE Sensitive     AMPICILLIN/SULBACTAM <=2 SENSITIVE Sensitive     PIP/TAZO <=4 SENSITIVE Sensitive     * 90,000 COLONIES/mL ESCHERICHIA COLI  Resp Panel by RT-PCR (Flu A&B, Covid) Nasopharyngeal Swab     Status: None   Collection Time: 08/02/20 12:22 PM   Specimen: Nasopharyngeal Swab; Nasopharyngeal(NP) swabs in vial transport medium  Result Value Ref Range Status   SARS Coronavirus 2 by RT PCR NEGATIVE NEGATIVE Final    Comment: (NOTE) SARS-CoV-2 target nucleic acids are NOT DETECTED.  The SARS-CoV-2 RNA is generally detectable in upper respiratory specimens during the acute phase of infection. The lowest concentration of SARS-CoV-2 viral copies this assay can detect is 138 copies/mL. A negative result does not preclude SARS-Cov-2 infection and should not be used as the sole basis for treatment or other patient management decisions. A negative result may occur with  improper specimen collection/handling,  submission of specimen other than nasopharyngeal swab, presence of viral mutation(s) within the areas targeted by this assay, and inadequate number of viral copies(<138 copies/mL). A negative result must be combined with clinical observations, patient history, and epidemiological information. The expected result is Negative.  Fact Sheet for Patients:  EntrepreneurPulse.com.au  Fact Sheet for Healthcare Providers:  IncredibleEmployment.be  This test is no t yet approved or cleared by the [place] FDA and  has been authorized for detection and/or diagnosis of SARS-CoV-2 by FDA under an Emergency Use Authorization (EUA). This EUA will remain  in effect (meaning this test can be used) for the duration of the COVID-19 declaration under Section 564(b)(1) of the Act, 21 U.S.C.section 360bbb-3(b)(1), unless the authorization is terminated  or revoked sooner.       Influenza A by PCR NEGATIVE NEGATIVE Final   Influenza B by PCR NEGATIVE NEGATIVE Final    Comment: (NOTE) The Xpert Xpress SARS-CoV-2/FLU/RSV plus assay is intended as an aid in the diagnosis of influenza from Nasopharyngeal swab specimens and should not be used as a sole basis for treatment. Nasal washings and aspirates are unacceptable for Xpert Xpress SARS-CoV-2/FLU/RSV testing.  Fact Sheet for Patients: EntrepreneurPulse.com.au  Fact Sheet for Healthcare Providers: IncredibleEmployment.be  This test is not yet approved or cleared by the [place] FDA and has been authorized for detection and/or diagnosis of SARS-CoV-2 by FDA under an Emergency Use Authorization (EUA). This EUA will remain in effect (meaning this test can be used) for the duration of the COVID-19 declaration under Section 564(b)(1) of the Act, 21 U.S.C. section 360bbb-3(b)(1), unless the authorization is terminated or revoked.  Performed at [facility], [place] [address] [place], [place] [address]   Aerobic/Anaerobic Culture (surgical/deep wound)     Status: None (Preliminary result)   Collection Time: 08/02/20  2:33 PM  Specimen: PATH Other; Tissue  Result Value Ref Range Status   Specimen Description   Final    ABSCESS Performed at [place] [address], [place], [place] [address]    Special Requests   Final    NONE Performed at [facility], [place] [address], [place], [place] [address]    Gram Stain   Final    ABUNDANT WBC PRESENT, PREDOMINANTLY PMN ABUNDANT GRAM POSITIVE COCCI MODERATE GRAM NEGATIVE RODS FEW GRAM POSITIVE RODS Performed at [facility], [place] [address] [place],  [address]    Culture PENDING  Incomplete   Report Status PENDING  Incomplete     Labs: BNP (last 3 results) No results for input(s): BNP in the last 8760 hours. Basic Metabolic Panel: Recent Labs  Lab 08/01/20 1718 08/02/20 0830 08/03/20 0524  NA 134* 133* 134*  K 4.1 3.6 3.6  CL 99 99 102  CO2 '22 22 23  ' GLUCOSE 363* 281* 224*  BUN '11 9 13  ' CREATININE 0.98 0.87 0.90  CALCIUM 9.7 9.0 8.8*   Liver Function Tests: No results for input(s): AST, ALT, ALKPHOS, BILITOT, PROT, ALBUMIN in the last 168 hours. No results for input(s): LIPASE, AMYLASE in the last 168 hours. No results for input(s): AMMONIA in the last 168 hours. CBC: Recent Labs  Lab 08/01/20 1718 08/02/20 0830 08/03/20 0524  WBC 14.4* 14.9* 12.9*  NEUTROABS  --  10.7*  --   HGB 15.2 14.0 12.7*  HCT 41.2 38.0* 35.2*  MCV 84.9 84.6 85.6  PLT 261 257 231   Cardiac Enzymes: No results for input(s): CKTOTAL, CKMB, CKMBINDEX, TROPONINI in the last 168 hours. BNP: Invalid input(s): POCBNP CBG: Recent Labs  Lab 08/02/20 1340 08/02/20 1521 08/02/20 2038 08/03/20 0722 08/03/20 1121  GLUCAP 271* 272* 304* 195* 202*   D-Dimer No results for input(s): DDIMER in the last 72 hours. Hgb A1c No results for  input(s): HGBA1C in the last 72 hours. Lipid Profile No results for input(s): CHOL, HDL, LDLCALC, TRIG, CHOLHDL, LDLDIRECT in the last 72 hours. Thyroid function studies No results for input(s): TSH, T4TOTAL, T3FREE, THYROIDAB in the last 72 hours.  Invalid input(s): FREET3 Anemia work up No results for input(s): VITAMINB12, FOLATE, FERRITIN, TIBC, IRON, RETICCTPCT in the last 72 hours. Urinalysis    Component Value Date/Time   COLORURINE AMBER (A) 08/02/2020 1134   APPEARANCEUR CLEAR 08/02/2020 1134   LABSPEC 1.040 (H) 08/02/2020 1134   PHURINE 6.0 08/02/2020 1134   GLUCOSEU >=500 (A) 08/02/2020 1134   HGBUR NEGATIVE 08/02/2020 1134   BILIRUBINUR NEGATIVE 08/02/2020 1134   KETONESUR 5 (A) 08/02/2020 1134   PROTEINUR NEGATIVE 08/02/2020 1134   UROBILINOGEN 2.0 (H) 07/29/2020 1805   NITRITE NEGATIVE 08/02/2020 1134   LEUKOCYTESUR NEGATIVE 08/02/2020 1134   Sepsis Labs Invalid input(s): PROCALCITONIN,  WBC,  LACTICIDVEN Microbiology Recent Results (from the past 240 hour(s))  Urine culture     Status: Abnormal   Collection Time: 07/29/20  6:35 PM   Specimen: Urine, Random  Result Value Ref Range Status   Specimen Description URINE, RANDOM  Final   Special Requests   Final    NONE Performed at [facility], [place] [address], [place], [place] [address]    Culture 90,000 COLONIES/mL ESCHERICHIA COLI (A)  Final   Report Status 07/31/2020 FINAL  Final   Organism ID, Bacteria ESCHERICHIA COLI (A)  Final      Susceptibility   Escherichia coli - MIC*  AMPICILLIN <=2 SENSITIVE Sensitive     CEFAZOLIN <=4 SENSITIVE Sensitive     CEFEPIME <=0.12 SENSITIVE Sensitive     CEFTRIAXONE <=0.25 SENSITIVE Sensitive     CIPROFLOXACIN <=0.25 SENSITIVE Sensitive     GENTAMICIN 2 SENSITIVE Sensitive     IMIPENEM <=0.25 SENSITIVE Sensitive     NITROFURANTOIN <=16 SENSITIVE Sensitive     TRIMETH/SULFA <=20 SENSITIVE Sensitive     AMPICILLIN/SULBACTAM <=2 SENSITIVE Sensitive      PIP/TAZO <=4 SENSITIVE Sensitive     * 90,000 COLONIES/mL ESCHERICHIA COLI  Resp Panel by RT-PCR (Flu A&B, Covid) Nasopharyngeal Swab     Status: None   Collection Time: 08/02/20 12:22 PM   Specimen: Nasopharyngeal Swab; Nasopharyngeal(NP) swabs in vial transport medium  Result Value Ref Range Status   SARS Coronavirus 2 by RT PCR NEGATIVE NEGATIVE Final    Comment: (NOTE) SARS-CoV-2 target nucleic acids are NOT DETECTED.  The SARS-CoV-2 RNA is generally detectable in upper respiratory specimens during the acute phase of infection. The lowest concentration of SARS-CoV-2 viral copies this assay can detect is 138 copies/mL. A negative result does not preclude SARS-Cov-2 infection and should not be used as the sole basis for treatment or other patient management decisions. A negative result may occur with  improper specimen collection/handling, submission of specimen other than nasopharyngeal swab, presence of viral mutation(s) within the areas targeted by this assay, and inadequate number of viral copies(<138 copies/mL). A negative result must be combined with clinical observations, patient history, and epidemiological information. The expected result is Negative.  Fact Sheet for Patients:  EntrepreneurPulse.com.au  Fact Sheet for Healthcare Providers:  IncredibleEmployment.be  This test is no t yet approved or cleared by the [place] FDA and  has been authorized for detection and/or diagnosis of SARS-CoV-2 by FDA under an Emergency Use Authorization (EUA). This EUA will remain  in effect (meaning this test can be used) for the duration of the COVID-19 declaration under Section 564(b)(1) of the Act, 21 U.S.C.section 360bbb-3(b)(1), unless the authorization is terminated  or revoked sooner.       Influenza A by PCR NEGATIVE NEGATIVE Final   Influenza B by PCR NEGATIVE NEGATIVE Final    Comment: (NOTE) The Xpert Xpress SARS-CoV-2/FLU/RSV  plus assay is intended as an aid in the diagnosis of influenza from Nasopharyngeal swab specimens and should not be used as a sole basis for treatment. Nasal washings and aspirates are unacceptable for Xpert Xpress SARS-CoV-2/FLU/RSV testing.  Fact Sheet for Patients: EntrepreneurPulse.com.au  Fact Sheet for Healthcare Providers: IncredibleEmployment.be  This test is not yet approved or cleared by the [place] FDA and has been authorized for detection and/or diagnosis of SARS-CoV-2 by FDA under an Emergency Use Authorization (EUA). This EUA will remain in effect (meaning this test can be used) for the duration of the COVID-19 declaration under Section 564(b)(1) of the Act, 21 U.S.C. section 360bbb-3(b)(1), unless the authorization is terminated or revoked.  Performed at [facility], [place] [address], [place], [place] [address]   Aerobic/Anaerobic Culture (surgical/deep wound)     Status: None (Preliminary result)   Collection Time: 08/02/20  2:33 PM   Specimen: PATH Other; Tissue  Result Value Ref Range Status   Specimen Description   Final    ABSCESS Performed at [place] [address], [place], [place] [address]    Special Requests   Final    NONE Performed at [facility], [address] [place], [place]  [address]    Gram Stain   Final    ABUNDANT WBC PRESENT, PREDOMINANTLY PMN ABUNDANT GRAM POSITIVE COCCI MODERATE GRAM NEGATIVE RODS FEW GRAM POSITIVE RODS Performed at [facility], [place] [address] [place], [place] [address]    Culture PENDING  Incomplete   Report Status PENDING  Incomplete     Time coordinating discharge: Over 30 minutes  SIGNED:   Shawna Clamp, MD  [facility] 08/03/2020, 12:37 PM Pager   If 7PM-7AM, please contact night-coverage [email or web address]

## 2020-08-03 NOTE — Discharge Instructions (Signed)
Anorectal Abscess An abscess is an infected area that contains a collection of pus. An anorectal abscess is an abscess that is near the opening of the anus or around the rectum. Without treatment, an anorectal abscess can become larger and cause other problems, such as a more serious body-wide infection or pain, especially during bowel movements. What are the causes? This condition is caused by plugged glands or an infection in one of these areas:  The anus.  The area between the anus and the scrotum in males or between the anus and the vagina in females (perineum). What increases the risk? The following factors may make you more likely to develop this condition:  Diabetes or inflammatory bowel disease.  Having a body defense system (immune system) that is weak.  Engaging in anal sex.  Having a sexually transmitted infection (STI).  Certain kinds of cancer, such as rectal carcinoma, leukemia, or lymphoma. What are the signs or symptoms? The main symptom of this condition is pain. The pain may be a throbbing pain that gets worse during bowel movements. Other symptoms include:  Swelling and redness in the area of the abscess. The redness may go beyond the abscess and appear as a red streak on the skin.  A visible, painful lump, or a lump that can be felt when touched.  Bleeding or pus-like discharge from the area.  Fever.  General weakness.  Constipation.  Diarrhea. How is this diagnosed? This condition is diagnosed based on your medical history and a physical exam of the affected area.  This may involve examining the rectal area with a gloved hand (digital rectal exam).  Sometimes, the health care provider needs to look into the rectum using a probe, scope, or imaging test.  For women, it may require a careful vaginal exam. How is this treated? Treatment for this condition may include:  Incision and drainage surgery. This involves making an incision over the abscess to  drain the pus.  Medicines, including antibiotic medicine, pain medicine, stool softeners, or laxatives. Follow these instructions at home: Medicines  Take over-the-counter and prescription medicines only as told by your health care provider.  If you were prescribed an antibiotic medicine, use it as told by your health care provider. Do not stop using the antibiotic even if you start to feel better.  Do not drive or use heavy machinery while taking prescription pain medicine. Wound care   If gauze was used in the abscess, follow instructions from your health care provider about removing or changing the gauze. It can usually be removed in 2-3 days.  Wash your hands with soap and water before you remove or change your gauze. If soap and water are not available, use hand sanitizer.  If one or more drains were placed in the abscess cavity, be careful not to pull at them. Your health care provider will tell you how long they need to remain in place.  Check your incision area every day for signs of infection. Check for: ? More redness, swelling, or pain. ? More fluid or blood. ? Warmth. ? Pus or a bad smell. Managing pain, stiffness, and swelling   Take a sitz bath 3-4 times a day and after bowel movements. This will help reduce pain and swelling.  To relieve pain, try sitting: ? On a heating pad with the setting on low. ? On an inflatable donut-shaped cushion.  If directed, put ice on the affected area: ? Put ice in a plastic bag. ? Place   a towel between your skin and the bag. ? Leave the ice on for 20 minutes, 2-3 times a day. General instructions  Follow any diet instructions given by your health care provider.  Keep all follow-up visits as told by your health care provider. This is important. Contact a health care provider if you have:  Bleeding from your incision.  Pain, swelling, or redness that does not improve or gets worse.  Trouble passing stool or  urine.  Symptoms that return after treatment. Get help right away if you:  Have problems moving or using your legs.  Have severe or increasing pain.  Have swelling in the affected area that suddenly gets worse.  Have a large increase in bleeding or passing of pus.  Develop chills or a fever. Summary  An anorectal abscess is an abscess that is near the opening of the anus or around the rectum. An abscess is an infected area that contains a collection of pus.  The main symptom of this condition is pain. It may be a throbbing pain that gets worse during bowel movements.  Treatment for an anorectal abscess may include surgery to drain the pus from the abscess. Medicines and sitz baths may also be a part of your treatment plan. This information is not intended to replace advice given to you by your health care provider. Make sure you discuss any questions you have with your health care provider. Document Revised: 09/09/2017 Document Reviewed: 09/09/2017 Elsevier Patient Education  2020 [employer].   How to Take a [employer] A sitz bath is a warm water bath that may be used to care for your rectum, genital area, or the area between your rectum and genitals (perineum). For a sitz bath, the water only comes up to your hips and covers your buttocks. A sitz bath may done at home in a bathtub or with a portable sitz bath that fits over the toilet. Your health care provider may recommend a sitz bath to help:  Relieve pain and discomfort after delivering a baby.  Relieve pain and itching from hemorrhoids or anal fissures.  Relieve pain after certain surgeries.  Relax muscles that are sore or tight. How to take a sitz bath Take 3-4 sitz baths a day, or as many as told by your health care provider. Bathtub sitz bath To take a sitz bath in a bathtub: 1. Partially fill a bathtub with warm water. The water should be deep enough to cover your hips and buttocks when you are sitting in the  tub. 2. If your health care provider told you to put medicine in the water, follow his or her instructions. 3. Sit in the water. 4. Open the tub drain a little, and leave it open during your bath. 5. Turn on the warm water again, enough to replace the water that is draining out. Keep the water running throughout your bath. This helps keep the water at the right level and the right temperature. 6. Soak in the water for 15-20 minutes, or as long as told by your health care provider. 7. When you are done, be careful when you stand up. You may feel dizzy. 8. After the sitz bath, pat yourself dry. Do not rub your skin to dry it.  Over-the-toilet sitz bath To take a sitz bath with an over-the-toilet basin: 1. Follow the manufacturer's instructions. 2. Fill the basin with warm water. 3. If your health care provider told you to put medicine in the water, follow his  or her instructions. 4. Sit on the seat. Make sure the water covers your buttocks and perineum. 5. Soak in the water for 15-20 minutes, or as long as told by your health care provider. 6. After the sitz bath, pat yourself dry. Do not rub your skin to dry it. 7. Clean and dry the basin between uses. 8. Discard the basin if it cracks, or according to the manufacturer's instructions. Contact a health care provider if:  Your symptoms get worse. Do not continue with sitz baths if your symptoms get worse.  You have new symptoms. If this happens, do not continue with sitz baths until you talk with your health care provider. Summary  A sitz bath is a warm water bath in which the water only comes up to your hips and covers your buttocks.  A sitz bath may help relieve itching, relieve pain, and relax muscles that are sore or tight in the lower part of your body, including your genital area.  Take 3-4 sitz baths a day, or as many as told by your health care provider. Soak in the water for 15-20 minutes.  Do not continue with sitz baths if  your symptoms get worse. This information is not intended to replace advice given to you by your health care provider. Make sure you discuss any questions you have with your health care provider. Document Revised: 01/02/2019 Document Reviewed: 08/05/2017 Elsevier Patient Education  2020 [employer]  Heart Healthy, Consistent Carbohydrate Nutrition Therapy   A heart-healthy and consistent carbohydrate diet is recommended to manage heart disease and diabetes. To follow a heart-healthy and consistent carbohydrate diet, . Eat a balanced diet with whole grains, fruits and vegetables, and lean protein sources.  . Choose heart-healthy unsaturated fats. Limit saturated fats, trans fats, and cholesterol intake. Eat more plant-based or vegetarian meals using beans and soy foods for protein.  . Eat whole, unprocessed foods to limit the amount of sodium (salt) you eat.  . Choose a consistent amount of carbohydrate at each meal and snack. Limit refined carbohydrates especially sugar, sweets and sugar-sweetened beverages.  . If you drink alcohol, do so in moderation: one serving per day (women) and two servings per day (men). o One serving is equivalent to 12 ounces beer, 5 ounces wine, or 1.5 ounces distilled spirits  Tips Tips for Choosing Heart-Healthy Fats Choose lean protein and low-fat dairy foods to reduce saturated fat intake. . Saturated fat is usually found in animal-based protein and is associated with certain health risks. Saturated fat is the biggest contributor to raise low-density lipoprotein (LDL) cholesterol levels. Research shows that limiting saturated fat lowers unhealthy cholesterol levels. Eat no more than 7% of your total calories each day from saturated fat. Ask your RDN to help you determine how much saturated fat is right for you. . There are many foods that do not contain large amounts of saturated fats. Swapping these foods to replace foods high in saturated fats will help you  limit the saturated fat you eat and improve your cholesterol levels. You can also try eating more plant-based or vegetarian meals. Instead of. Try:  Whole milk, cheese, yogurt, and ice cream 1% or skim milk, low-fat cheese, non-fat yogurt, and low-fat ice cream  Fatty, marbled beef and pork Lean beef, pork, or venison  Poultry with skin Poultry without skin  Butter, stick margarine Reduced-fat, whipped, or liquid spreads  Coconut oil, palm oil Liquid vegetable oils: corn, canola, olive, soybean and safflower oils   Avoid  foods that contain trans fats. . Trans fats increase levels of LDL-cholesterol. Hydrogenated fat in processed foods is the main source of trans fats in foods.  . Trans fats can be found in stick margarine, shortening, processed sweets, baked goods, some fried foods, and packaged foods made with hydrogenated oils. Avoid foods with "partially hydrogenated oil" on the ingredient list such as: cookies, pastries, baked goods, biscuits, crackers, microwave popcorn, and frozen dinners. Choose foods with heart healthy fats. . Polyunsaturated and monounsaturated fat are unsaturated fats that may help lower your blood cholesterol level when used in place of saturated fat in your diet. . Ask your RDN about taking a dietary supplement with plant sterols and stanols to help lower your cholesterol level. Marland Kitchen Research shows that substituting saturated fats with unsaturated fats is beneficial to cholesterol levels. Try these easy swaps: Instead of. Try:  Butter, stick margarine, or solid shortening Reduced-fat, whipped, or liquid spreads  Beef, pork, or poultry with skin Fish and seafood  Chips, crackers, snack foods Raw or unsalted nuts and seeds or nut butters Hummus with vegetables Avocado on toast  Coconut oil, palm oil Liquid vegetable oils: corn, canola, olive, soybean and safflower oils  Limit the amount of cholesterol you eat to less than 200 milligrams per day. . Cholesterol is a  substance carried through the bloodstream via lipoproteins, which are known as "transporters" of fat. Some body functions need cholesterol to work properly, but too much cholesterol in the bloodstream can damage arteries and build up blood vessel linings (which can lead to heart attack and stroke). You should eat less than 200 milligrams cholesterol per day. Marland Kitchen People respond differently to eating cholesterol. There is no test available right now that can figure out which people will respond more to dietary cholesterol and which will respond less. For individuals with high intake of dietary cholesterol, different types of increase (none, small, moderate, large) in LDL-cholesterol levels are all possible.  . Food sources of cholesterol include egg yolks and organ meats such as liver, gizzards. Limit egg yolks to two to four per week and avoid organ meats like liver and gizzards to control cholesterol intake. Tips for Choosing Heart-Healthy Carbohydrates Consume a consistent amount of carbohydrate . It is important to eat foods with carbohydrates in moderation because they impact your blood glucose level. Carbohydrates can be found in many foods such as: . Grains (breads, crackers, rice, pasta, and cereals)  . Starchy Vegetables (potatoes, corn, and peas)  . Beans and legumes  . Milk, soy milk, and yogurt  . Fruit and fruit juice  . Sweets (cakes, cookies, ice cream, jam and jelly) . Your RDN will help you set a goal for how many carbohydrate servings to eat at your meals and snacks. For many adults, eating 3 to 5 servings of carbohydrate foods at each meal and 1 or 2 carbohydrate servings for each snack works well.  . Check your blood glucose level regularly. It can tell you if you need to adjust when you eat carbohydrates. . Choose foods rich in viscous (soluble) fiber . Viscous, or soluble, is found in the walls of plant cells. Viscous fiber is found only in plant-based foods. Eating foods with fiber  helps to lower your unhealthy cholesterol and keep your blood glucose in range  . Rich sources of viscous fiber include vegetables (asparagus, Brussels sprouts, sweet potatoes, turnips) fruit (apricots, mangoes, oranges), legumes, and whole grains (barley, oats, and oat bran).  . As you increase your  fiber intake gradually, also increase the amount of water you drink. This will help prevent constipation.  . If you have difficulty achieving this goal, ask your RDN about fiber laxatives. Choose fiber supplements made with viscous fibers such as psyllium seed husks or methylcellulose to help lower unhealthy cholesterol.  . Limit refined carbohydrates  . There are three types of carbohydrates: starches, sugar, and fiber. Some carbohydrates occur naturally in food, like the starches in rice or corn or the sugars in fruits and milk. Refined carbohydrates--foods with high amounts of simple sugars--can raise triglyceride levels. High triglyceride levels are associated with coronary heart disease. . Some examples of refined carbohydrate foods are table sugar, sweets, and beverages sweetened with added sugar. Tips for Reducing Sodium (Salt) Although sodium is important for your body to function, too much sodium can be harmful for people with high blood pressure. As sodium and fluid buildup in your tissues and bloodstream, your blood pressure increases. High blood pressure may cause damage to other organs and increase your risk for a stroke. Even if you take a pill for blood pressure or a water pill (diuretic) to remove fluid, it is still important to have less salt in your diet. Ask your doctor and RDN what amount of sodium is right for you. Marland Kitchen Avoid processed foods. Eat more fresh foods.  . Fresh fruits and vegetables are naturally low in sodium, as well as frozen vegetables and fruits that have no added juices or sauces.  . Fresh meats are lower in sodium than processed meats, such as bacon, sausage, and hotdogs.  Read the nutrition label or ask your butcher to help you find a fresh meat that is low in sodium. . Eat less salt--at the table and when cooking.  . A single teaspoon of table salt has 2,300 mg of sodium.  . Leave the salt out of recipes for pasta, casseroles, and soups.  . Ask your RDN how to cook your favorite recipes without sodium . Be a [employer], building services.  . Look for food packages that say "salt-free" or "sodium-free." These items contain less than 5 milligrams of sodium per serving.  Marland Kitchen "Very low-sodium" products contain less than 35 milligrams of sodium per serving.  Marland Kitchen "Low-sodium" products contain less than 140 milligrams of sodium per serving.  . Beware for "Unsalted" or "No Added Salt" products. These items may still be high in sodium. Check the nutrition label. . Add flavors to your food without adding sodium.  . Try lemon juice, lime juice, fruit juice or vinegar.  . Dry or fresh herbs add flavor. Try basil, bay leaf, dill, rosemary, parsley, sage, dry mustard, nutmeg, thyme, and paprika.  . Pepper, red pepper flakes, and cayenne pepper can add spice t your meals without adding sodium. Hot sauce contains sodium, but if you use just a drop or two, it will not add up to much.  Arnoldo Morale a sodium-free seasoning blend or make your own at home. Additional Lifestyle Tips Achieve and maintain a healthy weight. . Talk with your RDN or your doctor about what is a healthy weight for you. . Set goals to reach and maintain that weight.  . To lose weight, reduce your calorie intake along with increasing your physical activity. A weight loss of 10 to 15 pounds could reduce LDL-cholesterol by 5 milligrams per deciliter. Participate in physical activity. . Talk with your health care team to find out what types of physical activity are best for you. Set a plan  to get about 30 minutes of exercise on most days.  Foods Recommended Food Group Foods Recommended  Grains Whole grain breads and cereals, including  whole wheat, barley, rye, buckwheat, corn, teff, quinoa, millet, amaranth, brown or wild rice, sorghum, and oats Pasta, especially whole wheat or other whole grain types  [employer], quinoa or wild rice Whole grain crackers, bread, rolls, pitas Home-made bread with reduced-sodium baking soda  Protein Foods Lean cuts of beef and pork (loin, leg, round, extra lean hamburger)  Skinless [employer] photographer and other wild game Dried beans and peas Nuts and nut butters Meat alternatives made with soy or textured vegetable protein  Egg whites or egg substitute Cold cuts made with lean meat or soy protein  Dairy Nonfat (skim), low-fat, or 1%-fat milk  Nonfat or low-fat yogurt or cottage cheese Fat-free and low-fat cheese  Vegetables Fresh, frozen, or canned vegetables without added fat or salt   Fruits Fresh, frozen, canned, or dried fruit   Oils Unsaturated oils (corn, olive, peanut, soy, sunflower, canola)  Soft or liquid margarines and vegetable oil spreads  Salad dressings Seeds and nuts  Avocado   Foods Not Recommended Food Group Foods Not Recommended  Grains Breads or crackers topped with salt Cereals (hot or cold) with more than 300 mg sodium per serving Biscuits, cornbread, and other "quick" breads prepared with baking soda Bread crumbs or stuffing mix from a store High-fat bakery products, such as doughnuts, biscuits, croissants, danish pastries, pies, cookies Instant cooking foods to which you add hot water and stir--potatoes, noodles, rice, etc. Packaged starchy foods--seasoned noodle or rice dishes, stuffing mix, macaroni and cheese dinner Snacks made with partially hydrogenated oils, including chips, cheese puffs, snack mixes, regular crackers, butter-flavored popcorn  Protein Foods Higher-fat cuts of meats (ribs, t-bone steak, regular hamburger) Bacon, sausage, or hot dogs Cold cuts, such as salami or bologna, deli meats, cured meats, corned beef Organ meats (liver,  brains, gizzards, sweetbreads) Poultry with skin Fried or smoked meat, poultry, and fish Whole eggs and egg yolks (more than 2-4 per week) Salted legumes, nuts, seeds, or nut/seed butters Meat alternatives with high levels of sodium (>300 mg per serving) or saturated fat (>5 g per serving)  Dairy Whole milk,?2% fat milk, buttermilk Whole milk yogurt or ice cream Cream Half-&-half Cream cheese Sour cream Cheese  Vegetables Canned or frozen vegetables with salt, fresh vegetables prepared with salt, butter, cheese, or cream sauce Fried vegetables Pickled vegetables such as olives, pickles, or sauerkraut  Fruits Fried fruits Fruits served with butter or cream  Oils Butter, stick margarine, shortening Partially hydrogenated oils or trans fats Tropical oils (coconut, palm, palm kernel oils)  Other Candy, sugar sweetened soft drinks and desserts Salt, sea salt, garlic salt, and seasoning mixes containing salt Bouillon cubes Ketchup, barbecue sauce, Worcestershire sauce, soy sauce, teriyaki sauce Miso Salsa Pickles, olives, relish   Heart Healthy Consistent Carbohydrate Vegetarian (Lacto-Ovo) Sample 1-Day Menu  Breakfast 1 cup oatmeal, cooked (2 carbohydrate servings)   cup blueberries (1 carbohydrate serving)  11 almonds, without salt  1 cup 1% milk (1 carbohydrate serving)  1 cup coffee  Morning Snack 1 cup fat-free plain yogurt (1 carbohydrate serving)  Lunch 1 whole wheat bun (1 carbohydrate servings)  1 black bean burger (1 carbohydrate servings)  1 slice cheddar cheese, low sodium  2 slices tomatoes  2 leaves lettuce  1 teaspoon mustard  1 small pear (1 carbohydrate servings)  1 cup green tea, unsweetened  Afternoon Snack  1/3 cup trail mix with nuts, seeds, and raisins, without salt (1 carbohydrate servinga)  Evening Meal  cup meatless chicken  2/3 cup brown rice, cooked (2 carbohydrate servings)  1 cup broccoli, cooked (2/3 carbohydrate serving)   cup carrots,  cooked (1/3 carbohydrate serving)  2 teaspoons olive oil  1 teaspoon balsamic vinegar  1 whole wheat dinner roll (1 carbohydrate serving)  1 teaspoon margarine, soft, tub  1 cup 1% milk (1 carbohydrate serving)  Evening Snack 1 extra small banana (1 carbohydrate serving)  1 tablespoon peanut butter   Heart Healthy Consistent Carbohydrate Vegan Sample 1-Day Menu  Breakfast 1 cup oatmeal, cooked (2 carbohydrate servings)   cup blueberries (1 carbohydrate serving)  11 almonds, without salt  1 cup soymilk fortified with calcium, vitamin B12, and vitamin D  1 cup coffee  Morning Snack 6 ounces soy yogurt (1 carbohydrate servings)  Lunch 1 whole wheat bun(1 carbohydrate servings)  1 black bean burger (1 carbohydrate serving)  2 slices tomatoes  2 leaves lettuce  1 teaspoon mustard  1 small pear (1 carbohydrate servings)  1 cup green tea, unsweetened  Afternoon Snack 1/3 cup trail mix with nuts, seeds, and raisins, without salt (1 carbohydrate servings)  Evening Meal  cup meatless chicken  2/3 cup brown rice, cooked (2 carbohydrate servings)  1 cup broccoli, cooked (2/3 carbohydrate serving)   cup carrots, cooked (1/3 carbohydrate serving)  2 teaspoons olive oil  1 teaspoon balsamic vinegar  1 whole wheat dinner roll (1 carbohydrate serving)  1 teaspoon margarine, soft, tub  1 cup soymilk fortified with calcium, vitamin B12, and vitamin D  Evening Snack 1 extra small banana (1 carbohydrate serving)  1 tablespoon peanut butter    Heart Healthy Consistent Carbohydrate Sample 1-Day Menu  Breakfast 1 cup cooked oatmeal (2 carbohydrate servings)  3/4 cup blueberries (1 carbohydrate serving)  1 ounce almonds  1 cup skim milk (1 carbohydrate serving)  1 cup coffee  Morning Snack 1 cup sugar-free nonfat yogurt (1 carbohydrate serving)  Lunch 2 slices whole-wheat bread (2 carbohydrate servings)  2 ounces lean [place] breast  1 ounce low-fat Swiss cheese  1 teaspoon mustard  1  slice tomato  1 lettuce leaf  1 small pear (1 carbohydrate serving)  1 cup skim milk (1 carbohydrate serving)  Afternoon Snack 1 ounce trail mix with unsalted nuts, seeds, and raisins (1 carbohydrate serving)  Evening Meal 3 ounces salmon  2/3 cup cooked brown rice (2 carbohydrate servings)  1 teaspoon soft margarine  1 cup cooked broccoli with 1/2 cup cooked carrots (1 carbohydrate serving  Carrots, cooked, boiled, drained, without salt  1 cup lettuce  1 teaspoon olive oil with vinegar for dressing  1 small whole grain roll (1 carbohydrate serving)  1 teaspoon soft margarine  1 cup unsweetened tea  Evening Snack 1 extra-small banana (1 carbohydrate serving)  Copyright 2020  [employer]y [employer]. All rights reserved.  Diabetes Resource: http://pugh.biz/

## 2020-08-03 NOTE — Progress Notes (Signed)
Inpatient Diabetes Program Recommendations  AACE/ADA: New Consensus Statement on Inpatient Glycemic Control  Target Ranges:  Prepandial:   less than 140 mg/dL      Peak postprandial:   less than 180 mg/dL (1-2 hours)      Critically ill patients:  140 - 180 mg/dL   Results for Brandon Mcneil, Brandon Mcneil (MRN [id number]) as of 08/03/2020 09:25  Ref. Range 08/02/2020 07:04 08/02/2020 13:40 08/02/2020 15:21 08/02/2020 20:38 08/03/2020 07:22  Glucose-Capillary Latest Ref Range: 70 - 99 mg/dL 761 (H) 607 (H) 371 (H) 304 (H) 195 (H)  Results for Brandon Mcneil, Brandon Mcneil (MRN [id number]) as of 08/03/2020 09:25  Ref. Range 03/06/2020 14:55 06/19/2020 11:17  Hemoglobin A1C Latest Ref Range: 4.0 - 5.6 % 9.8 (A) 8.7 (A)   Review of Glycemic Control  Diabetes history: DM2 Outpatient Diabetes medications: Glipizide 10 mg BID,  Metformin 1000 mg BID, Humalog 12-14 units BID (in the morning and the evening) Current orders for Inpatient glycemic control: Lantus 20 units daily, Novolog 0-15 units TID with meals, Novolog 0-5 units QHS, Novolog 4 units TID with meals  Inpatient Diabetes Program Recommendations:    Insulin:  Please consider increasing Lantus to 23 units daily.  Outpatient DM medications: Patient reports Lantus was over $450 to get refilled at [facility]. Question if Rx could be sent to [facility] and [facility] and if it would be affordable there. TOC is consulted for assistance with medications.  NOTE: Noted consult for Diabetes Coordinator. Diabetes Coordinator is not on campus over the weekend but available by pager from 8am to 5pm for questions or concerns. Spoke with patient over the phone regarding DM control and issue with getting insulin. Patient confirms that he seen PCP at [facility] on 06/19/20 and Glipizide was added at that time. Patient states that he had been taking Lantus 20 units daily and Novolog TID with meals but he was not able to afford to get them  filled so he has been using insulin from his aunt which is Humalog. Patient reports he has been taking Humalog 12-14 units BID and his glucose has ranged from 180-upper 200's mg/dl with taking Glipizide, Metformin, and Humalog. Discussed Humalog insulin, duration, andhow it works. Patient reports that the Lantus insulin was going to be over $450 at [facility] where the Rx was sent. Patient notes that he has gotten meds from [facility] and [facility]. Unsure why Rx for Lantus was sent to [facility] instead of [facility] pharmacy. TOC has been consulted regarding medication assistance to see if patient can get Lantus from [facility] pharmacy and how much it would be. Also discussed affordable insulins with patient (Novolin N, Novolin R, and Novolin 70/30) and explained that those insulins are $25 per vial or $43 per box of 5 insulin pens at [facility]. Discussed each insulin and how they work. If Lantus and Novolog insulin are going to be expensive for patient, may need to switch to Novolin 70/30 insulin. Discussed A1C results 8.7% on 06/19/20 and reviewed glucose and A1C goals. Discussed importance of checking CBGs and maintaining good CBG control to prevent long-term and short-term complications. Explained how hyperglycemia leads to damage within blood vessels which lead to the common complications seen with uncontrolled diabetes. Stressed to the patient the importance of improving glycemic control to prevent further complications from uncontrolled diabetes. Asked patient to take DM medications as prescribed consistently and to follow up with PCP regarding improving DM control.  Patient verbalized understanding of information discussed and  reports no further questions at this time related to diabetes.  Thanks, Orlando Penner, RN, MSN, CDE Diabetes Coordinator Inpatient Diabetes Program [phone] (Team Pager)

## 2020-08-05 ENCOUNTER — Other Ambulatory Visit: Payer: Self-pay

## 2020-08-05 ENCOUNTER — Telehealth: Payer: Self-pay

## 2020-08-05 ENCOUNTER — Encounter (INDEPENDENT_AMBULATORY_CARE_PROVIDER_SITE_OTHER): Payer: Self-pay | Admitting: Primary Care

## 2020-08-05 ENCOUNTER — Ambulatory Visit (INDEPENDENT_AMBULATORY_CARE_PROVIDER_SITE_OTHER): Payer: Self-pay | Admitting: Primary Care

## 2020-08-05 VITALS — BP 141/95 | HR 98 | Temp 94.6°F | Ht 68.0 in | Wt 218.8 lb

## 2020-08-05 DIAGNOSIS — Z760 Encounter for issue of repeat prescription: Secondary | ICD-10-CM

## 2020-08-05 DIAGNOSIS — I10 Essential (primary) hypertension: Secondary | ICD-10-CM

## 2020-08-05 DIAGNOSIS — E119 Type 2 diabetes mellitus without complications: Secondary | ICD-10-CM

## 2020-08-05 DIAGNOSIS — E118 Type 2 diabetes mellitus with unspecified complications: Secondary | ICD-10-CM

## 2020-08-05 DIAGNOSIS — Z09 Encounter for follow-up examination after completed treatment for conditions other than malignant neoplasm: Secondary | ICD-10-CM

## 2020-08-05 LAB — GLUCOSE, POCT (MANUAL RESULT ENTRY): POC Glucose: 254 mg/dl — AB (ref 70–99)

## 2020-08-05 MED ORDER — METFORMIN HCL 1000 MG PO TABS: 1000.0000 mg | ORAL_TABLET | Freq: Two times a day (BID) | ORAL | 1 refills | Status: DC

## 2020-08-05 MED ORDER — LANTUS SOLOSTAR 100 UNIT/ML ~~LOC~~ SOPN: 20.0000 [IU] | PEN_INJECTOR | Freq: Every day | SUBCUTANEOUS | 6 refills | Status: DC

## 2020-08-05 MED ORDER — INSULIN ASPART 100 UNIT/ML ~~LOC~~ SOLN: 10.0000 [IU] | Freq: Three times a day (TID) | SUBCUTANEOUS | 3 refills | Status: DC

## 2020-08-05 MED FILL — Insulin Aspart Inj 100 Unit/ML: SUBCUTANEOUS | Qty: 0.04 | Status: AC

## 2020-08-05 MED FILL — Insulin Aspart Inj 100 Unit/ML: SUBCUTANEOUS | Qty: 0.03 | Status: AC

## 2020-08-05 MED FILL — Insulin Aspart Inj 100 Unit/ML: SUBCUTANEOUS | Qty: 0.05 | Status: AC

## 2020-08-05 MED FILL — ?BASAGLAR 100 UNITS/ML KWPE: 100 | 15 days supply | Qty: 3 | Fill #0

## 2020-08-05 MED FILL — INSULIN ASPART 100 UNIT/ML: 100 | 27 days supply | Qty: 10 | Fill #0

## 2020-08-05 MED FILL — METFORMIN HCL 1000 MG TABS: 1000 | 30 days supply | Qty: 60 | Fill #0

## 2020-08-05 NOTE — Patient Instructions (Signed)

## 2020-08-05 NOTE — Progress Notes (Signed)
HPI  [name] 39 y.o.male presents for follow up from the hospital. Admit date to the hospital was 08/02/20, patient was discharged from the hospital on 08/03/20, patient was admitted for: Essential hypertension, Type 2 diabetes mellitus with hyperlipidemia ([place]) and Abscess    Past Medical History:  Diagnosis Date  . Depression   . Diabetes ([place])   . Essential hypertension 02/03/2017  . Hypercholesteremia   . Hypertension      Allergies  Allergen Reactions  . Lisinopril Swelling and Other (See Comments)    Lips and mouth became swollen  . Penicillins Itching, Swelling and Rash      Current Outpatient Medications on File Prior to Visit  Medication Sig Dispense Refill  . acetaminophen (TYLENOL) 500 MG tablet Take 1,000 mg by mouth every 6 (six) hours as needed for mild pain.    Marland Kitchen amLODipine (NORVASC) 10 MG tablet Take 1 tablet (10 mg total) by mouth daily. 90 tablet 1  . aspirin 325 MG EC tablet Take 325 mg by mouth daily.    Marland Kitchen atorvastatin (LIPITOR) 20 MG tablet Take 1 tablet (20 mg total) by mouth daily. 90 tablet 1  . blood glucose meter kit and supplies KIT Dispense based on patient and insurance preference. Use up to four times daily as directed. (FOR ICD-9 250.00, 250.01). 1 each 0  . Blood Glucose Monitoring Suppl (TRUE METRIX METER) DEVI 1 kit by Does not apply route daily. Use as instructed to check blood sugar daily. 1 each 0  . glipiZIDE (GLUCOTROL) 10 MG tablet Take 1 tablet (10 mg total) by mouth 2 (two) times daily before a meal. 180 tablet 1  . glucose blood (TRUE METRIX BLOOD GLUCOSE TEST) test strip Use as instructed to check blood sugar daily. 100 each 11  . insulin aspart (NOVOLOG) 100 UNIT/ML injection Inject 6-10 Units into the skin 2 (two) times daily after a meal. Sliding scale (Patient not taking: Reported on 08/02/2020) 10 mL 3  . insulin glargine (LANTUS SOLOSTAR) 100 UNIT/ML Solostar Pen Inject 20 Units into the skin daily. (Patient not  taking: No sig reported) 3 mL 6  . metFORMIN (GLUCOPHAGE) 1000 MG tablet Take 1 tablet (1,000 mg total) by mouth 2 (two) times daily with a meal. (Patient taking differently: Take 500-1,000 mg by mouth 2 (two) times daily as needed ("Depending on what my blood sugar is. Just how much I think I need.").) 180 tablet 1  . potassium chloride (KLOR-CON) 10 MEQ tablet Take 10 mEq by mouth daily as needed ("When I feel dehydrated").    . sulfamethoxazole-trimethoprim (BACTRIM DS) 800-160 MG tablet Take 2 tablets by mouth every 12 (twelve) hours for 10 days. 20 tablet 0  . TRUEplus Lancets 28G MISC Use as instructed to check blood sugar daily. 100 each 11  . [DISCONTINUED] losartan-hydrochlorothiazide (HYZAAR) 100-25 MG tablet Take 1 tablet by mouth daily. 90 tablet 0   No current facility-administered medications on file prior to visit.    ROS: all negative except above.   Physical Exam: Filed Weights   08/05/20 1432  Weight: 218 lb 12.8 oz (99.2 kg)   BP (!) 141/95 (BP Location: Right Arm, Patient Position: Sitting, Cuff Size: Normal)   Pulse 98   Temp (!) 94.6 F (34.8 C) (Temporal)   Ht _0  (1.727 m)   Wt 218 lb 12.8 oz (99.2 kg)   SpO2 100%   BMI 33.27 kg/m  General Appearance: Well nourished, obese male in no apparent  distress. Eyes: PERRLA, EOMs, conjunctiva no swelling or erythema Sinuses: No Frontal/maxillary tenderness ENT/Mouth: Ext aud canals clear, TMs without erythema, bulging. No erythema, swelling, or exudate on post pharynx.  Tonsils not swollen or erythematous. Hearing normal.  Neck: Supple, thyroid normal.  Respiratory: Respiratory effort normal, BS equal bilaterally without rales, rhonchi, wheezing or stridor.  Cardio: RRR with no MRGs. Brisk peripheral pulses without edema.  Abdomen: Soft, + BS.  Non tender, no guarding, rebound, hernias, masses. Lymphatics: Non tender without lymphadenopathy.  Musculoskeletal: Full ROM, 5/5 strength, normal gait.  Skin: Warm, dry  without rashes, lesions, ecchymosis.  Neuro: Cranial nerves intact. Normal muscle tone, no cerebellar symptoms. Sensation intact.  Psych: Awake and oriented X 3, normal affect, Insight and Judgment appropriate.  Brandon Mcneil was seen today for hospitalization follow-up.  Diagnoses and all orders for this visit:  Type 2 diabetes mellitus without complication, without long-term current use of insulin (HCC) -     Microalbumin/Creatinine Ratio, Urine -     Glucose (CBG)  Type 2 diabetes mellitus with complication, without long-term current use of insulin (HCC) : Goal of therapy: Less than 6.5 hemoglobin A1c. Foods that are high in carbohydrates are the following rice, potatoes, breads, sugars, and pastas.  Reduction in the intake (eating) will assist in lowering your blood sugars. -     insulin glargine (LANTUS SOLOSTAR) 100 UNIT/ML Solostar Pen; Inject 20 Units into the skin daily.   - Essential hypertension, benign Counseled on blood pressure goal of less than 130/80, low-sodium, DASH diet, medication compliance, 150 minutes of moderate intensity exercise per week. Discussed medication compliance, adverse effects.  Other orders/Medication refill -     metFORMIN (GLUCOPHAGE) 1000 MG tablet; Take 1 tablet (1,000 mg total) by mouth 2 (two) times daily with a meal. -     insulin aspart (NOVOLOG) 100 UNIT/ML injection; Inject 10 Units into the skin 3 (three) times daily with meals. Sliding scale  blood sugars 200-250 give 4 units of insulin, 251-300 give 6 units, 300-350 give 8 units, 351-400 give 10units, 351-400 ,> 400 give 12 units and call M.D. Discussed hypoglycemia protocol.     insulin glargine (LANTUS SOLOSTAR) 100 UNIT/ML Solostar Pen; Inject 20 Units into the skin daily.  Hospital discharge follow-up  Retrieved from hospital discharge  1 Go to [facility] [place] (General Surgery) on 08/13/2020; Follow up appointment scheduled for 11:30 AM. Please arrive 30 min prior to appointment  time. Bring photo ID and insurance information. 2 Follow up with Oletta Lamas Milford Cage, NP (Internal Medicine)- completed     Brandon Perna, NP 2:58 PM

## 2020-08-05 NOTE — Telephone Encounter (Signed)
Transition Care Management Unsuccessful Follow-up Telephone Call  Date of discharge and from where:  08/03/2020, [facility]   Attempts:  1st Attempt  Reason for unsuccessful TCM follow-up call:  Left voice message - call placed to # [phone]  Patient has appointment with PCP today.

## 2020-08-06 LAB — MICROALBUMIN / CREATININE URINE RATIO
Creatinine, Urine: 493.3 mg/dL
Microalb/Creat Ratio: 5 mg/g creat (ref 0–29)
Microalbumin, Urine: 23.2 ug/mL

## 2020-08-06 LAB — AEROBIC/ANAEROBIC CULTURE W GRAM STAIN (SURGICAL/DEEP WOUND)

## 2020-08-12 DIAGNOSIS — K611 Rectal abscess: Secondary | ICD-10-CM

## 2020-08-12 HISTORY — DX: Rectal abscess: K61.1

## 2020-08-21 ENCOUNTER — Ambulatory Visit: Payer: Self-pay

## 2020-08-28 ENCOUNTER — Ambulatory Visit: Payer: Self-pay

## 2020-09-19 ENCOUNTER — Ambulatory Visit (INDEPENDENT_AMBULATORY_CARE_PROVIDER_SITE_OTHER): Payer: Self-pay | Admitting: Primary Care

## 2020-09-24 ENCOUNTER — Ambulatory Visit: Payer: Self-pay | Admitting: General Surgery

## 2020-09-24 NOTE — H&P (Signed)
The patient is a 40 year old male presenting for a post-operative visit. Pt is status post I&D of right posterior perirectal abscess on 08/02/20 by Dr. Donell Beers. Discharged postop day 1. The area healed up relatively well on its own, but he continues to have drainage. He notices this every few weeks. He states the area has been lanced about 6 or 7 times in the past 3 years.    Problem List/Past Medical Romie Levee, MD; 09/24/2020 11:43 AM) ANAL FISTULA (K60.3)  Allergies Marliss Coots, CNA; 09/24/2020 11:31 AM) No Known Drug Allergies [08/13/2020]: Allergies Reconciled  Medication History Marliss Coots, CNA; 09/24/2020 11:31 AM) Atorvastatin Calcium (20MG  Tablet, Oral) Active. Insulin Aspart (100UNIT/ML Solution, Subcutaneous) Active. metFORMIN HCl (1000MG  Tablet, Oral) Active. TRUEplus Lancets 28G Active. Medications Reconciled  Past Medical History:  Diagnosis Date  . Depression   . Diabetes (HCC)   . Essential hypertension 02/03/2017  . Hypercholesteremia   . Hypertension    Past Surgical History:  Procedure Laterality Date  . INCISION AND DRAINAGE PERIRECTAL ABSCESS N/A 08/02/2020   Procedure: IRRIGATION AND DEBRIDEMENT PERIRECTAL ABSCESS;  Surgeon: 02/05/2017, MD;  Location: [facility];  Service: General;  Laterality: N/A;   Family History  Problem Relation Age of Onset  . Healthy Mother   . Healthy Father    Social History   Socioeconomic History  . Marital status: Single    Spouse name: Not on file  . Number of children: 3  . Years of education: 10th  . Highest education level: Not on file  Occupational History  . Occupation: unemployed  Tobacco Use  . Smoking status: Current Every Day Smoker    Packs/day: 0.50    Years: 14.00    Pack years: 7.00    Types: Cigarettes  . Smokeless tobacco: Never Used  . Tobacco comment: 1/2 pack a day  Substance and Sexual Activity  . Alcohol use: Yes    Alcohol/week: 2.0 standard drinks    Types: 2 Cans  of beer per week    Comment: occasional  . Drug use: No    Types: Marijuana    Comment: MJ former user.  Last use in September 2017  . Sexual activity: Yes    Partners: Female    Birth control/protection: None  Other Topics Concern  . Not on file  Social History Narrative   Originally from [place]   Did not finish October 2017 to get GED   Lives with his mother, his sister and brother.   Review of Systems - Respiratory ROS: no cough, shortness of breath, or wheezing Cardiovascular ROS: no chest pain or dyspnea on exertion Gastrointestinal ROS: positive for - constipation negative for - abdominal pain, blood in stools or change in stools Genito-Urinary ROS: no dysuria, trouble voiding, or hematuria  Vitals [place]ford Alston CNA; 09/24/2020 11:32 AM) 09/24/2020 11:32 AM Weight: 217.25 lb Height: 66in Body Surface Area: 2.07 m Body Mass Index: 35.06 kg/m  Temp.: 97.37F  Pulse: 99 (Regular)  P.OX: 97% (Room air)    Physical Exam 11/22/2020 MD; 09/24/2020 11:42 AM)  General Note: Alert, oriented, in no acute distress  Chest and Lung Exam Note: Effort normal CV: RRR Abd: soft Rectal Note: Right anteriolateral perianal region: There is a draining sinus noted with palpable cord tracking posteriorly    Assessment & Plan   ANAL FISTULA (K60.3) Impression: 40 year old male who presents to the office approximately 3 months status post I&D of a recurrent perirectal abscess. He continues  to have purulent drainage. On exam, he has a right anterior fistula external opening. We discussed surgical correction of his anal fistula. We discussed the need for exam under anesthesia and fistulotomy versus seton placement. We have discussed the indications for seton, and the need for additional surgery afterwards. We have discussed postoperative pain. Patient does seem to have some constipation. I have given him some information on how to control this prior to surgery  to prevent post operative complications.

## 2020-10-04 ENCOUNTER — Ambulatory Visit (INDEPENDENT_AMBULATORY_CARE_PROVIDER_SITE_OTHER): Payer: Self-pay | Admitting: Primary Care

## 2020-10-16 ENCOUNTER — Ambulatory Visit (INDEPENDENT_AMBULATORY_CARE_PROVIDER_SITE_OTHER): Payer: Self-pay | Admitting: Primary Care

## 2020-10-24 ENCOUNTER — Encounter (INDEPENDENT_AMBULATORY_CARE_PROVIDER_SITE_OTHER): Payer: Self-pay | Admitting: Primary Care

## 2020-10-24 ENCOUNTER — Other Ambulatory Visit: Payer: Self-pay

## 2020-10-24 ENCOUNTER — Ambulatory Visit (INDEPENDENT_AMBULATORY_CARE_PROVIDER_SITE_OTHER): Payer: Self-pay | Admitting: Primary Care

## 2020-10-24 VITALS — BP 135/91 | HR 90 | Temp 97.2°F | Ht 68.0 in | Wt 211.8 lb

## 2020-10-24 DIAGNOSIS — Z760 Encounter for issue of repeat prescription: Secondary | ICD-10-CM

## 2020-10-24 DIAGNOSIS — I10 Essential (primary) hypertension: Secondary | ICD-10-CM

## 2020-10-24 DIAGNOSIS — E782 Mixed hyperlipidemia: Secondary | ICD-10-CM

## 2020-10-24 DIAGNOSIS — E119 Type 2 diabetes mellitus without complications: Secondary | ICD-10-CM

## 2020-10-24 LAB — POCT GLYCOSYLATED HEMOGLOBIN (HGB A1C): Hemoglobin A1C: 8.5 % — AB (ref 4.0–5.6)

## 2020-10-24 LAB — GLUCOSE, POCT (MANUAL RESULT ENTRY): POC Glucose: 184 mg/dl — AB (ref 70–99)

## 2020-10-24 MED ORDER — AMLODIPINE BESYLATE 10 MG PO TABS: 10.0000 mg | ORAL_TABLET | Freq: Every day | ORAL | 1 refills | Status: DC

## 2020-10-24 MED ORDER — LANTUS SOLOSTAR 100 UNIT/ML ~~LOC~~ SOPN: 25.0000 [IU] | PEN_INJECTOR | Freq: Every day | SUBCUTANEOUS | 6 refills | Status: DC

## 2020-10-24 MED ORDER — METFORMIN HCL 1000 MG PO TABS
1000.0000 mg | ORAL_TABLET | Freq: Two times a day (BID) | ORAL | 1 refills | Status: DC
Start: 2020-10-24 — End: 2021-07-21

## 2020-10-24 MED ORDER — GLIPIZIDE 10 MG PO TABS: 10.0000 mg | ORAL_TABLET | Freq: Two times a day (BID) | ORAL | 1 refills | Status: DC

## 2020-10-24 MED ORDER — LOSARTAN POTASSIUM 25 MG PO TABS: 25.0000 mg | ORAL_TABLET | Freq: Every day | ORAL | 6 refills | Status: DC

## 2020-10-24 MED ORDER — ATORVASTATIN CALCIUM 20 MG PO TABS
20.0000 mg | ORAL_TABLET | Freq: Every day | ORAL | 6 refills | Status: DC
Start: 2020-10-24 — End: 2021-08-12

## 2020-10-24 MED ORDER — INSULIN ASPART 100 UNIT/ML ~~LOC~~ SOLN: 10.0000 [IU] | Freq: Three times a day (TID) | SUBCUTANEOUS | 3 refills | Status: DC

## 2020-10-24 NOTE — Patient Instructions (Signed)
Diabetes Mellitus and Nutrition, Adult When you have diabetes, or diabetes mellitus, it is very important to have healthy eating habits because your blood sugar (glucose) levels are greatly affected by what you eat and drink. Eating healthy foods in the right amounts, at about the same times every day, can help you:  Control your blood glucose.  Lower your risk of heart disease.  Improve your blood pressure.  Reach or maintain a healthy weight. What can affect my meal plan? Every person with diabetes is different, and each person has different needs for a meal plan. Your health care provider may recommend that you work with a dietitian to make a meal plan that is best for you. Your meal plan may vary depending on factors such as:  The calories you need.  The medicines you take.  Your weight.  Your blood glucose, blood pressure, and cholesterol levels.  Your activity level.  Other health conditions you have, such as heart or kidney disease. How do carbohydrates affect me? Carbohydrates, also called carbs, affect your blood glucose level more than any other type of food. Eating carbs naturally raises the amount of glucose in your blood. Carb counting is a method for keeping track of how many carbs you eat. Counting carbs is important to keep your blood glucose at a healthy level, especially if you use insulin or take certain oral diabetes medicines. It is important to know how many carbs you can safely have in each meal. This is different for every person. Your dietitian can help you calculate how many carbs you should have at each meal and for each snack. How does alcohol affect me? Alcohol can cause a sudden decrease in blood glucose (hypoglycemia), especially if you use insulin or take certain oral diabetes medicines. Hypoglycemia can be a life-threatening condition. Symptoms of hypoglycemia, such as sleepiness, dizziness, and confusion, are similar to symptoms of having too much  alcohol.  Do not drink alcohol if: ? Your health care provider tells you not to drink. ? You are pregnant, may be pregnant, or are planning to become pregnant.  If you drink alcohol: ? Do not drink on an empty stomach. ? Limit how much you use to:  0-1 drink a day for women.  0-2 drinks a day for men. ? Be aware of how much alcohol is in your drink. In the [place] one drink equals one 12 oz bottle of beer (355 mL), one 5 oz glass of wine (148 mL), or one 1 oz glass of hard liquor (44 mL). ? Keep yourself hydrated with water, diet soda, or unsweetened iced tea.  Keep in mind that regular soda, juice, and other mixers may contain a lot of sugar and must be counted as carbs. What are tips for following this plan? Reading food labels  Start by checking the serving size on the "Nutrition Facts" label of packaged foods and drinks. The amount of calories, carbs, fats, and other nutrients listed on the label is based on one serving of the item. Many items contain more than one serving per package.  Check the total grams (g) of carbs in one serving. You can calculate the number of servings of carbs in one serving by dividing the total carbs by 15. For example, if a food has 30 g of total carbs per serving, it would be equal to 2 servings of carbs.  Check the number of grams (g) of saturated fats and trans fats in one serving. Choose foods that have   a low amount or none of these fats.  Check the number of milligrams (mg) of salt (sodium) in one serving. Most people should limit total sodium intake to less than 2,300 mg per day.  Always check the nutrition information of foods labeled as "low-fat" or "nonfat." These foods may be higher in added sugar or refined carbs and should be avoided.  Talk to your dietitian to identify your daily goals for nutrients listed on the label. Shopping  Avoid buying canned, pre-made, or processed foods. These foods tend to be high in fat, sodium, and added  sugar.  Shop around the outside edge of the grocery store. This is where you will most often find fresh fruits and vegetables, bulk grains, fresh meats, and fresh dairy. Cooking  Use low-heat cooking methods, such as baking, instead of high-heat cooking methods like deep frying.  Cook using healthy oils, such as olive, canola, or sunflower oil.  Avoid cooking with butter, cream, or high-fat meats. Meal planning  Eat meals and snacks regularly, preferably at the same times every day. Avoid going long periods of time without eating.  Eat foods that are high in fiber, such as fresh fruits, vegetables, beans, and whole grains. Talk with your dietitian about how many servings of carbs you can eat at each meal.  Eat 4-6 oz (112-168 g) of lean protein each day, such as lean meat, chicken, fish, eggs, or tofu. One ounce (oz) of lean protein is equal to: ? 1 oz (28 g) of meat, chicken, or fish. ? 1 egg. ?  cup (62 g) of tofu.  Eat some foods each day that contain healthy fats, such as avocado, nuts, seeds, and fish.   What foods should I eat? Fruits Berries. Apples. Oranges. Peaches. Apricots. Plums. Grapes. Mango. Papaya. Pomegranate. Kiwi. Cherries. Vegetables Lettuce. Spinach. Leafy greens, including kale, chard, collard greens, and mustard greens. Beets. Cauliflower. Cabbage. Broccoli. Carrots. Green beans. Tomatoes. Peppers. Onions. Cucumbers. Brussels sprouts. Grains Whole grains, such as whole-wheat or whole-grain bread, crackers, tortillas, cereal, and pasta. Unsweetened oatmeal. Quinoa. Brown or wild rice. Meats and other proteins Seafood. Poultry without skin. Lean cuts of poultry and beef. Tofu. Nuts. Seeds. Dairy Low-fat or fat-free dairy products such as milk, yogurt, and cheese. The items listed above may not be a complete list of foods and beverages you can eat. Contact a dietitian for more information. What foods should I avoid? Fruits Fruits canned with  syrup. Vegetables Canned vegetables. Frozen vegetables with butter or cream sauce. Grains Refined white flour and flour products such as bread, pasta, snack foods, and cereals. Avoid all processed foods. Meats and other proteins Fatty cuts of meat. Poultry with skin. Breaded or fried meats. Processed meat. Avoid saturated fats. Dairy Full-fat yogurt, cheese, or milk. Beverages Sweetened drinks, such as soda or iced tea. The items listed above may not be a complete list of foods and beverages you should avoid. Contact a dietitian for more information. Questions to ask a health care provider  Do I need to meet with a diabetes educator?  Do I need to meet with a dietitian?  What number can I call if I have questions?  When are the best times to check my blood glucose? Where to find more information:  American Diabetes Association: diabetes.org  Academy of Nutrition and Dietetics: www.eatright.org  National Institute of Diabetes and Digestive and Kidney Diseases: www.niddk.nih.gov  Association of Diabetes Care and Education Specialists: www.diabeteseducator.org Summary  It is important to have healthy eating   habits because your blood sugar (glucose) levels are greatly affected by what you eat and drink.  A healthy meal plan will help you control your blood glucose and maintain a healthy lifestyle.  Your health care provider may recommend that you work with a dietitian to make a meal plan that is best for you.  Keep in mind that carbohydrates (carbs) and alcohol have immediate effects on your blood glucose levels. It is important to count carbs and to use alcohol carefully. This information is not intended to replace advice given to you by your health care provider. Make sure you discuss any questions you have with your health care provider. Document Revised: 07/11/2019 Document Reviewed: 07/11/2019 Elsevier Patient Education  2021 Elsevier Inc.  

## 2020-10-24 NOTE — Progress Notes (Signed)
Pt is currently in treatment for alcoholism; is no longer drinking

## 2020-10-24 NOTE — Progress Notes (Signed)
Subjective:  Patient ID: Brandon Mcneil, male    DOB: Nov 18, 1980  Age: 40 y.o. MRN: [id number]  CC: Diabetes   HPI [name] is a 40 year old obese male who presents for follow-up of diabetes. Patient does check blood sugar at home Compliant with meds - Yes Checking CBGs? Yes  Fasting avg - 165-245 Exercising regularly? - Yes walking Watching carbohydrate intake? - Yes Neuropathy ? - No Hypoglycemic events - No  - Recovers with :   Pertinent ROS:  Polyuria - No Polydipsia - No Vision problems - Yes Management of HTN- Denies shortness of breath, headaches, chest pain or lower extremity edema, sudden onset, vision changes, unilateral weakness, dizziness, paresthesias Medications as noted below. Taking them regularly without complication/adverse reaction being reported today.  Patient has joined [employer] [facility] the [place]  that is helping with alcoholism tx- he has not had any alcohol in 3 weeks and therapy for depression, moods swing, and anxiety . He has not received any medication from this service yet.  History Brandon Mcneil has a past medical history of Depression, Diabetes ([place]), Essential hypertension (02/03/2017), Hypercholesteremia, and Hypertension.   He has a past surgical history that includes Incision and drainage perirectal abscess (N/A, 08/02/2020).   His family history includes Healthy in his father and mother.He reports that he has been smoking cigarettes. He has a 7.00 pack-year smoking history. He has never used smokeless tobacco. He reports current alcohol use of about 2.0 standard drinks of alcohol per week. He reports that he does not use drugs.  Current Outpatient Medications on File Prior to Visit  Medication Sig Dispense Refill  . acetaminophen (TYLENOL) 500 MG tablet Take 1,000 mg by mouth every 6 (six) hours as needed for mild pain.    Marland Kitchen aspirin 325 MG EC tablet Take 325 mg by mouth daily.    . blood glucose meter kit and supplies KIT  Dispense based on patient and insurance preference. Use up to four times daily as directed. (FOR ICD-9 250.00, 250.01). 1 each 0  . Blood Glucose Monitoring Suppl (TRUE METRIX METER) DEVI 1 kit by Does not apply route daily. Use as instructed to check blood sugar daily. 1 each 0  . glucose blood (TRUE METRIX BLOOD GLUCOSE TEST) test strip Use as instructed to check blood sugar daily. 100 each 11  . potassium chloride (KLOR-CON) 10 MEQ tablet Take 10 mEq by mouth daily as needed ("When I feel dehydrated").    . TRUEplus Lancets 28G MISC Use as instructed to check blood sugar daily. 100 each 11   No current facility-administered medications on file prior to visit.    ROS Review of Systems Pertinent positive and negative noted in HPI Objective:  BP (!) 135/91 (BP Location: Right Arm, Patient Position: Sitting, Cuff Size: Large)   Pulse 90   Temp (!) 97.2 F (36.2 C) (Temporal)   Ht '5\' 8"'  (1.727 m)   Wt 211 lb 12.8 oz (96.1 kg)   SpO2 99%   BMI 32.20 kg/m   BP Readings from Last 3 Encounters:  10/24/20 (!) 135/91  08/05/20 (!) 141/95  08/03/20 (!) 149/93    Wt Readings from Last 3 Encounters:  10/24/20 211 lb 12.8 oz (96.1 kg)  08/05/20 218 lb 12.8 oz (99.2 kg)  08/02/20 215 lb (97.5 kg)    Physical Exam Vitals reviewed.  Constitutional:      Appearance: He is obese.  HENT:     Head: Normocephalic.  Right Ear: External ear normal.     Left Ear: External ear normal.     Nose: Nose normal.  Eyes:     Extraocular Movements: Extraocular movements intact.  Cardiovascular:     Rate and Rhythm: Normal rate and regular rhythm.  Pulmonary:     Effort: Pulmonary effort is normal.     Breath sounds: Normal breath sounds.  Abdominal:     General: Bowel sounds are normal. There is distension.     Palpations: Abdomen is soft.  Musculoskeletal:        General: Normal range of motion.     Cervical back: Normal range of motion and neck supple.  Skin:    General: Skin is warm  and dry.  Neurological:     Mental Status: He is alert and oriented to person, place, and time.  Psychiatric:        Mood and Affect: Mood normal.        Thought Content: Thought content normal.        Judgment: Judgment normal.     Lab Results  Component Value Date   HGBA1C 8.5 (A) 10/24/2020   HGBA1C 8.7 (A) 06/19/2020   HGBA1C 9.8 (A) 03/06/2020    Lab Results  Component Value Date   WBC 12.9 (H) 08/03/2020   HGB 12.7 (L) 08/03/2020   HCT 35.2 (L) 08/03/2020   PLT 231 08/03/2020   GLUCOSE 224 (H) 08/03/2020   CHOL 146 06/19/2020   TRIG 148 06/19/2020   HDL 60 06/19/2020   LDLCALC 61 06/19/2020   ALT 28 06/19/2020   AST 16 06/19/2020   NA 134 (L) 08/03/2020   K 3.6 08/03/2020   CL 102 08/03/2020   CREATININE 0.90 08/03/2020   BUN 13 08/03/2020   CO2 23 08/03/2020   INR 1.0 10/22/2019   HGBA1C 8.5 (A) 10/24/2020     Assessment & Plan:   Abid was seen today for diabetes. Edsel was seen today for diabetes.  Diagnoses and all orders for this visit:  Type 2 diabetes mellitus without complication, with  long-term current use of insulin (HCC) -     HgB A1c  8.5 3 months ago 8.7 little progress but states adherence to medication but has loss the appetite to eat but does watching Carbs- asked patient to give examples candy ams , soda, breads, reading labels  Increased Lantus to 25 units daily, continue Metformin 1000 mg twice daily and glipizide 10. mg twice daily -     Ambulatory referral to Ophthalmology   Essential hypertension, benign Counseled on blood pressure goal of less than 130/80, low-sodium, DASH diet, medication compliance, 150 minutes of moderate intensity exercise per week. Discussed medication compliance, adverse effects. -     amLODipine (NORVASC) 10 MG tablet; Take 1 tablet (10 mg total) by mouth daily. -     losartan (COZAAR) 25 MG tablet; Take 1 tablet (25 mg total) by mouth daily.  Medication refill -     amLODipine (NORVASC) 10 MG  tablet; Take 1 tablet (10 mg total) by mouth daily. -     metFORMIN (GLUCOPHAGE) 1000 MG tablet; Take 1 tablet (1,000 mg total) by mouth 2 (two) times daily with a meal. -     atorvastatin (LIPITOR) 20 MG tablet; Take 1 tablet (20 mg total) by mouth daily. -     insulin aspart (NOVOLOG) 100 UNIT/ML injection; Inject 10 Units into the skin 3 (three) times daily with meals. Sliding scale  blood sugars 200-250  give 4 units of insulin, 251-300 give 6 units, 300-350 give 8 units, 351-400 give 10units, 351-400 ,> 400 give 12 units and call M.D. Discussed hypoglycemia protocol. -     glipiZIDE (GLUCOTROL) 10 MG tablet; Take 1 tablet (10 mg total) by mouth 2 (two) times daily before a meal. -     insulin glargine (LANTUS SOLOSTAR) 100 UNIT/ML Solostar Pen; Inject 25 Units into the skin daily.  Mixed hyperlipidemia Cholesterol medication was not taking due to cost we looked at [facility] $4 client this will cost $9 a month 3 pills explained the risk of heart attack and stroke and monitoring on fatty foods such as fried foods she needs organ meats etc. -     atorvastatin (LIPITOR) 20 MG tablet; Take 1 tablet (20 mg total) by mouth daily.  -    I have discontinued Hakan E. Cowher's losartan-hydrochlorothiazide. I have also changed his Lantus SoloStar. Additionally, I am having him start on losartan. Lastly, I am having him maintain his True Metrix Blood Glucose Test, True Metrix Meter, TRUEplus Lancets 28G, blood glucose meter kit and supplies, aspirin, acetaminophen, potassium chloride, amLODipine, metFORMIN, atorvastatin, insulin aspart, and glipiZIDE.  Meds ordered this encounter  Medications  . amLODipine (NORVASC) 10 MG tablet    Sig: Take 1 tablet (10 mg total) by mouth daily.    Dispense:  90 tablet    Refill:  1  . metFORMIN (GLUCOPHAGE) 1000 MG tablet    Sig: Take 1 tablet (1,000 mg total) by mouth 2 (two) times daily with a meal.    Dispense:  180 tablet    Refill:  1  . atorvastatin  (LIPITOR) 20 MG tablet    Sig: Take 1 tablet (20 mg total) by mouth daily.    Dispense:  30 tablet    Refill:  6  . insulin aspart (NOVOLOG) 100 UNIT/ML injection    Sig: Inject 10 Units into the skin 3 (three) times daily with meals. Sliding scale  blood sugars 200-250 give 4 units of insulin, 251-300 give 6 units, 300-350 give 8 units, 351-400 give 10units, 351-400 ,> 400 give 12 units and call M.D. Discussed hypoglycemia protocol.    Dispense:  10 mL    Refill:  3  . glipiZIDE (GLUCOTROL) 10 MG tablet    Sig: Take 1 tablet (10 mg total) by mouth 2 (two) times daily before a meal.    Dispense:  180 tablet    Refill:  1  . insulin glargine (LANTUS SOLOSTAR) 100 UNIT/ML Solostar Pen    Sig: Inject 25 Units into the skin daily.    Dispense:  3 mL    Refill:  6  . losartan (COZAAR) 25 MG tablet    Sig: Take 1 tablet (25 mg total) by mouth daily.    Dispense:  30 tablet    Refill:  6     Follow-up:   The above assessment and management plan was discussed with the patient. The patient verbalized understanding of and has agreed to the management plan. Patient is aware to call the clinic if symptoms fail to improve or worsen. Patient is aware when to return to the clinic for a follow-up visit. Patient educated on when it is appropriate to go to the emergency department.   Juluis Mire, NP-C

## 2021-01-07 ENCOUNTER — Ambulatory Visit (HOSPITAL_COMMUNITY)
Admission: EM | Admit: 2021-01-07 | Discharge: 2021-01-07 | Disposition: A | Discharge status: Home / Self Care | Payer: Self-pay | Attending: Internal Medicine | Admitting: Internal Medicine

## 2021-01-07 ENCOUNTER — Other Ambulatory Visit: Payer: Self-pay

## 2021-01-07 ENCOUNTER — Encounter (HOSPITAL_COMMUNITY): Payer: Self-pay | Admitting: Emergency Medicine

## 2021-01-07 DIAGNOSIS — I10 Essential (primary) hypertension: Secondary | ICD-10-CM | POA: Insufficient documentation

## 2021-01-07 DIAGNOSIS — Z1152 Encounter for screening for COVID-19: Secondary | ICD-10-CM | POA: Insufficient documentation

## 2021-01-07 DIAGNOSIS — J069 Acute upper respiratory infection, unspecified: Secondary | ICD-10-CM | POA: Insufficient documentation

## 2021-01-07 MED ORDER — BENZONATATE 100 MG PO CAPS: 100.0000 mg | ORAL_CAPSULE | Freq: Three times a day (TID) | ORAL | 0 refills | Status: DC | PRN

## 2021-01-07 NOTE — ED Triage Notes (Signed)
Pt presents with dry coughing spells. States after coughing spell gets dizzy and "sees star". Pt request COVID test for work.

## 2021-01-07 NOTE — Discharge Instructions (Addendum)
COVID testing ordered. It will take between 3-7 days for test results. Someone will contact you regarding abnormal results or you can access your results through MyChart.   In the meantime you should... Remain isolated in your home for 5 days from symptom onset AND greater than 48 hours of no fever without the use of fever-reducing medication Get plenty of rest and fluids Tessalon Perles prescribed for cough Flonase for nasal congestion and/or runny nose You can take OTC Zyrtec-D for nasal congestion, runny nose, and/or sore throat Use these medications as directed for symptom relief Use Tylenol or Ibuprofen as needed for fever or pain Return or go to the ER for any worsening or new symptoms such as high fever, worsening cough, shortness of breath, chest tightness, chest pain, changes in mental status, etc.

## 2021-01-07 NOTE — ED Provider Notes (Signed)
Man    CSN: [id number] Arrival date & time: 01/07/21  1529      History   Chief Complaint Chief Complaint  Patient presents with  . COVID Test  . Cough  . Dizziness    HPI Brandon Mcneil is a 40 y.o. male with multiple medical problems presents to urgent care today requesting COVID testing for work.  Patient reports leaving work on Thursday prior with frequent coughing spells leading to dizziness.  Cough described as nonproductive with nasal congestion.  Dizziness only occurring during extreme coughing fits.  Patient also describes frequent headache.  He denies any recent chest pain, shortness of breath, abdominal pain, N/V/D, fever or chills.  Patient's work is requesting PCR COVID testing.  Also of note, patient recently ran out of blood pressure medication.  PCP has called in and he is picking up today.   Past Medical History:  Diagnosis Date  . Depression   . Diabetes ([place])   . Essential hypertension 02/03/2017  . Hypercholesteremia   . Hypertension     Patient Active Problem List   Diagnosis Date Noted  . Abscess 08/02/2020  . Stress 02/08/2017  . Essential hypertension 02/03/2017  . Type 2 diabetes mellitus with hyperlipidemia ([place]) 02/03/2017  . Medication overdose 10/31/2013  . Bipolar I disorder, most recent episode depressed ([place]) 09/11/2013  . Tobacco use disorder 09/11/2013  . Newly diagnosed diabetes ([place]) 09/11/2013  . Dehydration 09/11/2013  . Pain, dental 09/11/2013  . DKA (diabetic ketoacidoses) 09/10/2013    Past Surgical History:  Procedure Laterality Date  . INCISION AND DRAINAGE PERIRECTAL ABSCESS N/A 08/02/2020   Procedure: IRRIGATION AND DEBRIDEMENT PERIRECTAL ABSCESS;  Surgeon: Stark Klein, MD;  Location: [facility];  Service: General;  Laterality: N/A;       Home Medications    Prior to Admission medications   Medication Sig Start Date End Date Taking? Authorizing Provider  benzonatate (TESSALON PERLES) 100 MG  capsule Take 1 capsule (100 mg total) by mouth 3 (three) times daily as needed for cough. 01/07/21 01/07/22 Yes Rudolpho Sevin, NP  acetaminophen (TYLENOL) 500 MG tablet Take 1,000 mg by mouth every 6 (six) hours as needed for mild pain.    [provider]  amLODipine (NORVASC) 10 MG tablet Take 1 tablet (10 mg total) by mouth daily. 10/24/20   Kerin Perna, NP  aspirin 325 MG EC tablet Take 325 mg by mouth daily.    [provider]  atorvastatin (LIPITOR) 20 MG tablet Take 1 tablet (20 mg total) by mouth daily. 10/24/20   Kerin Perna, NP  blood glucose meter kit and supplies KIT Dispense based on patient and insurance preference. Use up to four times daily as directed. (FOR ICD-9 250.00, 250.01). 03/13/20   Vanessa Kick, MD  Blood Glucose Monitoring Suppl (TRUE METRIX METER) DEVI 1 kit by Does not apply route daily. Use as instructed to check blood sugar daily. 08/09/19   Charlott Rakes, MD  glipiZIDE (GLUCOTROL) 10 MG tablet Take 1 tablet (10 mg total) by mouth 2 (two) times daily before a meal. 10/24/20   Kerin Perna, NP  glucose blood (TRUE METRIX BLOOD GLUCOSE TEST) test strip Use as instructed to check blood sugar daily. 08/09/19   Charlott Rakes, MD  insulin aspart (NOVOLOG) 100 UNIT/ML injection Inject 10 Units into the skin 3 (three) times daily with meals. Sliding scale  blood sugars 200-250 give 4 units of insulin, 251-300 give 6 units, 300-350 give 8  units, 351-400 give 10units, 351-400 ,> 400 give 12 units and call M.D. Discussed hypoglycemia protocol. 10/24/20   Kerin Perna, NP  insulin glargine (LANTUS SOLOSTAR) 100 UNIT/ML Solostar Pen Inject 25 Units into the skin daily. 10/24/20   Kerin Perna, NP  losartan (COZAAR) 25 MG tablet Take 1 tablet (25 mg total) by mouth daily. 10/24/20   Kerin Perna, NP  metFORMIN (GLUCOPHAGE) 1000 MG tablet Take 1 tablet (1,000 mg total) by mouth 2 (two) times daily with a meal. 10/24/20   Kerin Perna, NP  potassium chloride (KLOR-CON) 10 MEQ tablet Take 10 mEq by mouth daily as needed ("When I feel dehydrated").    [provider]  TRUEplus Lancets 28G MISC Use as instructed to check blood sugar daily. 08/09/19   Charlott Rakes, MD    Family History Family History  Problem Relation Age of Onset  . Healthy Mother   . Healthy Father     Social History Social History   Tobacco Use  . Smoking status: Current Every Day Smoker    Packs/day: 0.50    Years: 14.00    Pack years: 7.00    Types: Cigarettes  . Smokeless tobacco: Never Used  . Tobacco comment: 1/2 pack a day  Substance Use Topics  . Alcohol use: Yes    Alcohol/week: 2.0 standard drinks    Types: 2 Cans of beer per week    Comment: occasional  . Drug use: No    Types: Marijuana    Comment: [employer] former user.  Last use in September 2017     Allergies   Lisinopril and Penicillins   Review of Systems As stated in HPI otherwise negative   Physical Exam Triage Vital Signs ED Triage Vitals [01/07/21 1629]  Enc Vitals Group     BP (!) 141/91     Pulse Rate 75     Resp 16     Temp 99.1 F (37.3 C)     Temp Source Oral     SpO2 99 %     Weight      Height      Head Circumference      Peak Flow      Pain Score 0     Pain Loc      Pain Edu?      Excl. in [place]?    No data found.  Updated Vital Signs BP (!) 141/91 (BP Location: Right Arm)   Pulse 75   Temp 99.1 F (37.3 C) (Oral)   Resp 16   SpO2 99%   Visual Acuity Right Eye Distance:   Left Eye Distance:   Bilateral Distance:    Right Eye Near:   Left Eye Near:    Bilateral Near:     Physical Exam Constitutional:      General: He is not in acute distress.    Appearance: Normal appearance. He is not ill-appearing or toxic-appearing.  HENT:     Right Ear: Tympanic membrane and ear canal normal.     Left Ear: Tympanic membrane and ear canal normal.     Nose: Congestion present. No rhinorrhea.     Mouth/Throat:      Mouth: Mucous membranes are moist.     Pharynx: No oropharyngeal exudate.     Comments: Posterior pharyngeal erythema without swelling or exudate Eyes:     Extraocular Movements: Extraocular movements intact.     Conjunctiva/sclera: Conjunctivae normal.  Cardiovascular:     Rate  and Rhythm: Normal rate and regular rhythm.  Pulmonary:     Effort: Pulmonary effort is normal.     Breath sounds: Normal breath sounds. No wheezing, rhonchi or rales.  Abdominal:     General: Bowel sounds are normal.     Palpations: Abdomen is soft.  Musculoskeletal:        General: Normal range of motion.     Cervical back: Normal range of motion and neck supple. No rigidity.  Lymphadenopathy:     Cervical: No cervical adenopathy.  Skin:    General: Skin is warm and dry.  Neurological:     General: No focal deficit present.     Mental Status: He is alert and oriented to person, place, and time.  Psychiatric:        Mood and Affect: Mood normal.        Behavior: Behavior normal.      UC Treatments / Results  Labs (all labs ordered are listed, but only abnormal results are displayed) Labs Reviewed  SARS CORONAVIRUS 2 (TAT 6-24 HRS)    EKG   Radiology No results found.  Procedures Procedures (including critical care time)  Medications Ordered in UC Medications - No data to display  Initial Impression / Assessment and Plan / UC Course  I have reviewed the triage vital signs and the nursing notes.  Pertinent labs & imaging results that were available during my care of the patient were reviewed by me and considered in my medical decision making (see chart for details).  Viral URI c cough Screening for covid19 -Symptoms ongoing for approximately 6 days, VSS,  patient nontoxic-appearing -COVID-19 PCR testing sent -Follow-up and isolation precautions reviewed -Tessalon Perles, Zyrtec, Flonase as needed.  Push fluids  Hx hypertension -Recently ran out of meds and refilled by  PCP -Instructed him to pick up medications today and start taking again -Call PCP for follow-up  Reviewed expections re: course of current medical issues. Questions answered. Outlined signs and symptoms indicating need for more acute intervention. Pt verbalized understanding. AVS given   Final Clinical Impressions(s) / UC Diagnoses   Final diagnoses:  Viral upper respiratory tract infection  Encounter for screening for COVID-19  Essential hypertension     Discharge Instructions     COVID testing ordered. It will take between 3-7 days for test results. Someone will contact you regarding abnormal results or you can access your results through [place].   In the meantime you should... . Remain isolated in your home for 5 days from symptom onset AND greater than 48 hours of no fever without the use of fever-reducing medication . Get plenty of rest and fluids . Tessalon Perles prescribed for cough . Flonase for nasal congestion and/or runny nose . You can take OTC Zyrtec-D for nasal congestion, runny nose, and/or sore throat . Use these medications as directed for symptom relief . Use Tylenol or Ibuprofen as needed for fever or pain . Return or go to the ER for any worsening or new symptoms such as high fever, worsening cough, shortness of breath, chest tightness, chest pain, changes in mental status, etc.     ED Prescriptions    Medication Sig Dispense Auth. Provider   benzonatate (TESSALON PERLES) 100 MG capsule Take 1 capsule (100 mg total) by mouth 3 (three) times daily as needed for cough. 30 capsule Rudolpho Sevin, NP     PDMP not reviewed this encounter.   Rudolpho Sevin, NP 01/07/21 1719

## 2021-01-08 LAB — SARS CORONAVIRUS 2 (TAT 6-24 HRS): SARS Coronavirus 2: NEGATIVE

## 2021-01-27 ENCOUNTER — Ambulatory Visit (INDEPENDENT_AMBULATORY_CARE_PROVIDER_SITE_OTHER): Payer: Self-pay | Admitting: Primary Care

## 2021-02-03 ENCOUNTER — Other Ambulatory Visit: Payer: Self-pay

## 2021-02-03 ENCOUNTER — Ambulatory Visit (HOSPITAL_COMMUNITY)
Admission: EM | Admit: 2021-02-03 | Discharge: 2021-02-03 | Disposition: A | Discharge status: Home / Self Care | Payer: Self-pay | Attending: Emergency Medicine | Admitting: Emergency Medicine

## 2021-02-03 ENCOUNTER — Encounter (HOSPITAL_COMMUNITY): Payer: Self-pay

## 2021-02-03 DIAGNOSIS — L0231 Cutaneous abscess of buttock: Secondary | ICD-10-CM

## 2021-02-03 MED ORDER — DOXYCYCLINE HYCLATE 100 MG PO CAPS: 100.0000 mg | ORAL_CAPSULE | Freq: Two times a day (BID) | ORAL | 0 refills | Status: DC

## 2021-02-03 NOTE — ED Provider Notes (Signed)
[place]    CSN: [id number] Arrival date & time: 02/03/21  1636      History   Chief Complaint Chief Complaint  Patient presents with   Abscess    HPI Brandon Mcneil is a 40 y.o. male.   Patient presents with abscess of buttocks present for two days.Was painful with movement, had to leave work early.has been draining pus. Has been reoccurring in the same spot. Seen by general surgery but cannot complete sac removal at this time due to finances. Denies fever or chills.   Past Medical History:  Diagnosis Date   Depression    Diabetes ([place])    Essential hypertension 02/03/2017   Hypercholesteremia    Hypertension     Patient Active Problem List   Diagnosis Date Noted   Abscess 08/02/2020   Stress 02/08/2017   Essential hypertension 02/03/2017   Type 2 diabetes mellitus with hyperlipidemia ([place]) 02/03/2017   Medication overdose 10/31/2013   Bipolar I disorder, most recent episode depressed ([place]) 09/11/2013   Tobacco use disorder 09/11/2013   Newly diagnosed diabetes ([place]) 09/11/2013   Dehydration 09/11/2013   Pain, dental 09/11/2013   DKA (diabetic ketoacidoses) 09/10/2013    Past Surgical History:  Procedure Laterality Date   INCISION AND DRAINAGE PERIRECTAL ABSCESS N/A 08/02/2020   Procedure: IRRIGATION AND DEBRIDEMENT PERIRECTAL ABSCESS;  Surgeon: Stark Klein, MD;  Location: [facility];  Service: General;  Laterality: N/A;       Home Medications    Prior to Admission medications   Medication Sig Start Date End Date Taking? Authorizing Provider  doxycycline (VIBRAMYCIN) 100 MG capsule Take 1 capsule (100 mg total) by mouth 2 (two) times daily. 02/03/21  Yes Kameron Glazebrook, Leitha Schuller, NP  acetaminophen (TYLENOL) 500 MG tablet Take 1,000 mg by mouth every 6 (six) hours as needed for mild pain.    [provider]  amLODipine (NORVASC) 10 MG tablet Take 1 tablet (10 mg total) by mouth daily. 10/24/20   Kerin Perna, NP  aspirin 325 MG EC  tablet Take 325 mg by mouth daily.    [provider]  atorvastatin (LIPITOR) 20 MG tablet Take 1 tablet (20 mg total) by mouth daily. 10/24/20   Kerin Perna, NP  benzonatate (TESSALON PERLES) 100 MG capsule Take 1 capsule (100 mg total) by mouth 3 (three) times daily as needed for cough. 01/07/21 01/07/22  Rudolpho Sevin, NP  blood glucose meter kit and supplies KIT Dispense based on patient and insurance preference. Use up to four times daily as directed. (FOR ICD-9 250.00, 250.01). 03/13/20   Vanessa Kick, MD  Blood Glucose Monitoring Suppl (TRUE METRIX METER) DEVI 1 kit by Does not apply route daily. Use as instructed to check blood sugar daily. 08/09/19   Charlott Rakes, MD  glipiZIDE (GLUCOTROL) 10 MG tablet Take 1 tablet (10 mg total) by mouth 2 (two) times daily before a meal. 10/24/20   Kerin Perna, NP  glucose blood (TRUE METRIX BLOOD GLUCOSE TEST) test strip Use as instructed to check blood sugar daily. 08/09/19   Charlott Rakes, MD  insulin aspart (NOVOLOG) 100 UNIT/ML injection Inject 10 Units into the skin 3 (three) times daily with meals. Sliding scale  blood sugars 200-250 give 4 units of insulin, 251-300 give 6 units, 300-350 give 8 units, 351-400 give 10units, 351-400 ,> 400 give 12 units and call M.D. Discussed hypoglycemia protocol. 10/24/20   Kerin Perna, NP  insulin glargine (LANTUS SOLOSTAR) 100 UNIT/ML Solostar  Pen Inject 25 Units into the skin daily. 10/24/20   Kerin Perna, NP  losartan (COZAAR) 25 MG tablet Take 1 tablet (25 mg total) by mouth daily. 10/24/20   Kerin Perna, NP  metFORMIN (GLUCOPHAGE) 1000 MG tablet Take 1 tablet (1,000 mg total) by mouth 2 (two) times daily with a meal. 10/24/20   Kerin Perna, NP  potassium chloride (KLOR-CON) 10 MEQ tablet Take 10 mEq by mouth daily as needed ("When I feel dehydrated").    [provider]  TRUEplus Lancets 28G MISC Use as instructed to check blood sugar daily.  08/09/19   Charlott Rakes, MD    Family History Family History  Problem Relation Age of Onset   Healthy Mother    Healthy Father     Social History Social History   Tobacco Use   Smoking status: Every Day    Packs/day: 0.50    Years: 14.00    Pack years: 7.00    Types: Cigarettes   Smokeless tobacco: Never   Tobacco comments:    1/2 pack a day  Substance Use Topics   Alcohol use: Yes    Alcohol/week: 2.0 standard drinks    Types: 2 Cans of beer per week    Comment: occasional   Drug use: No    Types: Marijuana    Comment: [employer] former user.  Last use in September 2017     Allergies   Lisinopril and Penicillins   Review of Systems Review of Systems Defer to HPI    Physical Exam Triage Vital Signs ED Triage Vitals  Enc Vitals Group     BP 02/03/21 1711 (!) 156/93     Pulse Rate 02/03/21 1711 72     Resp 02/03/21 1711 18     Temp 02/03/21 1711 98.2 F (36.8 C)     Temp Source 02/03/21 1711 Oral     SpO2 02/03/21 1711 96 %     Weight --      Height --      Head Circumference --      Peak Flow --      Pain Score 02/03/21 1709 6     Pain Loc --      Pain Edu? --      Excl. in [place]? --    No data found.  Updated Vital Signs BP (!) 156/93 (BP Location: Right Arm)   Pulse 72   Temp 98.2 F (36.8 C) (Oral)   Resp 18   SpO2 96%   Visual Acuity Right Eye Distance:   Left Eye Distance:   Bilateral Distance:    Right Eye Near:   Left Eye Near:    Bilateral Near:     Physical Exam Constitutional:      Appearance: Normal appearance. He is normal weight.  Eyes:     Extraocular Movements: Extraocular movements intact.  Pulmonary:     Effort: Pulmonary effort is normal.  Musculoskeletal:        General: Normal range of motion.  Skin:    Comments: Open abscess present on upper right intergluteal cleft , unable to expel drainage with pressure   Neurological:     Mental Status: He is alert and oriented to person, place, and time. Mental status is at  baseline.  Psychiatric:        Mood and Affect: Mood normal.        Behavior: Behavior normal.     UC Treatments / Results  Labs (all  labs ordered are listed, but only abnormal results are displayed) Labs Reviewed - No data to display  EKG   Radiology No results found.  Procedures Procedures (including critical care time)  Medications Ordered in UC Medications - No data to display  Initial Impression / Assessment and Plan / UC Course  I have reviewed the triage vital signs and the nursing notes.  Pertinent labs & imaging results that were available during my care of the patient were reviewed by me and considered in my medical decision making (see chart for details).  Abscess of buttocks right  Doxycycline 100 mg bid for 7 days Warm compresses twice a day for 7 days Return precautions given for worsening infection or non healing area  Final Clinical Impressions(s) / UC Diagnoses   Final diagnoses:  Abscess of buttock, right     Discharge Instructions      Take antibiotic twice a day for next 7 days   Warm compresses at least four times a day, the more the better, this helps it drain  Follow up if area becomes painful, swollen, is not draining or does not heal      ED Prescriptions     Medication Sig Dispense Auth. Provider   doxycycline (VIBRAMYCIN) 100 MG capsule Take 1 capsule (100 mg total) by mouth 2 (two) times daily. 14 capsule Shravan Salahuddin, Leitha Schuller, NP      PDMP not reviewed this encounter.   Hans Eden, NP 02/03/21 1850

## 2021-02-03 NOTE — ED Triage Notes (Signed)
Pt presents with abscess on right buttocks X 2 days. 

## 2021-02-03 NOTE — Discharge Instructions (Addendum)
Take antibiotic twice a day for next 7 days   Warm compresses at least four times a day, the more the better, this helps it drain  Follow up if area becomes painful, swollen, is not draining or does not heal

## 2021-02-26 ENCOUNTER — Telehealth (INDEPENDENT_AMBULATORY_CARE_PROVIDER_SITE_OTHER): Payer: Self-pay | Admitting: Primary Care

## 2021-03-17 HISTORY — PX: INCISION AND DRAINAGE ABSCESS: SHX5864

## 2021-03-18 ENCOUNTER — Telehealth (INDEPENDENT_AMBULATORY_CARE_PROVIDER_SITE_OTHER): Payer: Self-pay | Admitting: Primary Care

## 2021-03-27 ENCOUNTER — Ambulatory Visit (INDEPENDENT_AMBULATORY_CARE_PROVIDER_SITE_OTHER): Payer: Self-pay | Admitting: Family

## 2021-03-27 ENCOUNTER — Encounter (INDEPENDENT_AMBULATORY_CARE_PROVIDER_SITE_OTHER): Payer: Self-pay | Admitting: Family

## 2021-03-27 ENCOUNTER — Other Ambulatory Visit: Payer: Self-pay

## 2021-03-27 ENCOUNTER — Encounter (HOSPITAL_COMMUNITY): Payer: Self-pay

## 2021-03-27 ENCOUNTER — Emergency Department (HOSPITAL_COMMUNITY)
Admission: EM | Admit: 2021-03-27 | Discharge: 2021-03-28 | Disposition: A | Discharge status: Home / Self Care | Payer: Self-pay | Attending: Emergency Medicine | Admitting: Emergency Medicine

## 2021-03-27 VITALS — BP 146/102 | HR 86 | Temp 98.5°F | Ht 68.0 in | Wt 218.4 lb

## 2021-03-27 DIAGNOSIS — I10 Essential (primary) hypertension: Secondary | ICD-10-CM

## 2021-03-27 DIAGNOSIS — E1110 Type 2 diabetes mellitus with ketoacidosis without coma: Secondary | ICD-10-CM | POA: Insufficient documentation

## 2021-03-27 DIAGNOSIS — E785 Hyperlipidemia, unspecified: Secondary | ICD-10-CM

## 2021-03-27 DIAGNOSIS — Z7984 Long term (current) use of oral hypoglycemic drugs: Secondary | ICD-10-CM | POA: Insufficient documentation

## 2021-03-27 DIAGNOSIS — L03317 Cellulitis of buttock: Secondary | ICD-10-CM | POA: Insufficient documentation

## 2021-03-27 DIAGNOSIS — E1169 Type 2 diabetes mellitus with other specified complication: Secondary | ICD-10-CM

## 2021-03-27 DIAGNOSIS — Z794 Long term (current) use of insulin: Secondary | ICD-10-CM | POA: Insufficient documentation

## 2021-03-27 DIAGNOSIS — Z7982 Long term (current) use of aspirin: Secondary | ICD-10-CM | POA: Insufficient documentation

## 2021-03-27 DIAGNOSIS — L0231 Cutaneous abscess of buttock: Secondary | ICD-10-CM | POA: Insufficient documentation

## 2021-03-27 DIAGNOSIS — Z09 Encounter for follow-up examination after completed treatment for conditions other than malignant neoplasm: Secondary | ICD-10-CM

## 2021-03-27 DIAGNOSIS — Z79899 Other long term (current) drug therapy: Secondary | ICD-10-CM | POA: Insufficient documentation

## 2021-03-27 DIAGNOSIS — F17210 Nicotine dependence, cigarettes, uncomplicated: Secondary | ICD-10-CM | POA: Insufficient documentation

## 2021-03-27 DIAGNOSIS — Z760 Encounter for issue of repeat prescription: Secondary | ICD-10-CM

## 2021-03-27 LAB — BASIC METABOLIC PANEL
Anion gap: 9 (ref 5–15)
BUN: 11 mg/dL (ref 6–20)
CO2: 23 mmol/L (ref 22–32)
Calcium: 9.1 mg/dL (ref 8.9–10.3)
Chloride: 101 mmol/L (ref 98–111)
Creatinine, Ser: 0.83 mg/dL (ref 0.61–1.24)
GFR, Estimated: >60 mL/min (ref >60)
Glucose, Bld: 226 mg/dL — ABNORMAL HIGH (ref 70–99)
Potassium: 3.4 mmol/L — ABNORMAL LOW (ref 3.5–5.1)
Sodium: 133 mmol/L — ABNORMAL LOW (ref 135–145)

## 2021-03-27 LAB — CBC WITH DIFFERENTIAL/PLATELET
Abs Immature Granulocytes: 0.06 10*3/uL (ref 0.00–0.07)
Basophils Absolute: 0 10*3/uL (ref 0.0–0.1)
Basophils Relative: 0 %
Eosinophils Absolute: 0.1 10*3/uL (ref 0.0–0.5)
Eosinophils Relative: 1 %
HCT: 36.2 % — ABNORMAL LOW (ref 39.0–52.0)
Hemoglobin: 13 g/dL (ref 13.0–17.0)
Immature Granulocytes: 1 %
Lymphocytes Relative: 14 %
Lymphs Abs: 1.6 10*3/uL (ref 0.7–4.0)
MCH: 30.1 pg (ref 26.0–34.0)
MCHC: 35.9 g/dL (ref 30.0–36.0)
MCV: 83.8 fL (ref 80.0–100.0)
Monocytes Absolute: 1 10*3/uL (ref 0.1–1.0)
Monocytes Relative: 9 %
Neutro Abs: 8.3 10*3/uL — ABNORMAL HIGH (ref 1.7–7.7)
Neutrophils Relative %: 75 %
Platelets: 212 10*3/uL (ref 150–400)
RBC: 4.32 MIL/uL (ref 4.22–5.81)
RDW: 12.5 % (ref 11.5–15.5)
WBC: 11 10*3/uL — ABNORMAL HIGH (ref 4.0–10.5)
nRBC: 0 % (ref 0.0–0.2)

## 2021-03-27 LAB — POCT GLYCOSYLATED HEMOGLOBIN (HGB A1C): Hemoglobin A1C: 8.5 % — AB (ref 4.0–5.6)

## 2021-03-27 LAB — CBG MONITORING, ED: Glucose-Capillary: 258 mg/dL — ABNORMAL HIGH (ref 70–99)

## 2021-03-27 LAB — GLUCOSE, POCT (MANUAL RESULT ENTRY): POC Glucose: 224 mg/dl — AB (ref 70–99)

## 2021-03-27 MED ORDER — LOSARTAN POTASSIUM 25 MG PO TABS: 25.0000 mg | ORAL_TABLET | Freq: Every day | ORAL | 6 refills | Status: DC

## 2021-03-27 MED ORDER — AMLODIPINE BESYLATE 10 MG PO TABS: 10.0000 mg | ORAL_TABLET | Freq: Every day | ORAL | 1 refills | Status: DC

## 2021-03-27 MED ORDER — INSULIN DETEMIR 100 UNIT/ML FLEXPEN: 13.0000 [IU] | Freq: Two times a day (BID) | SUBCUTANEOUS | Status: DC

## 2021-03-27 MED ORDER — INSULIN DETEMIR 100 UNIT/ML FLEXPEN
SUBCUTANEOUS | 3 refills | Status: DC
Start: 2021-03-27 — End: 2021-07-21

## 2021-03-27 NOTE — ED Notes (Signed)
Pt ambulatory in ED lobby. 

## 2021-03-27 NOTE — ED Provider Notes (Signed)
Emergency Medicine Provider Triage Evaluation Note  Brandon Mcneil , a 40 y.o. male  was evaluated in triage.  Pt complains of abscess to the buttocks. States he has 2 abscesses on the right buttock that have been present for several days.  Review of Systems  Positive: abscess Negative: fever  Physical Exam  BP (!) 149/98   Pulse 81   Temp 98 F (36.7 C) (Oral)   Resp 18   SpO2 99%  Gen:   Awake, no distress  Resp:  Normal effort  MSK:   Moves extremities without difficulty  Medical Decision Making  Medically screening exam initiated at 8:07 PM.  Appropriate orders placed.  Brandon Mcneil was informed that the remainder of the evaluation will be completed by another provider, this initial triage assessment does not replace that evaluation, and the importance of remaining in the ED until their evaluation is complete.     [name] 03/27/21 2007    Linwood Dibbles, MD 03/30/21 1344

## 2021-03-27 NOTE — Progress Notes (Signed)
Brandon Mcneil, is a 40 y.o. male    GXQ:[id number]  XKG:[id number]  DOB - 02/02/1981  Subjective:  Chief Complaint and HPI: Brandon Mcneil is a 40 y.o. male who presented to the clinic to follow-up for diabetes.  Latest blood sugar 3-4 times per day, range between 122 mid 200s.  Patient does not have health insurance and is using Humalog from a family member.  Not taking his prescribed Lantus due to cost.  He is out of his blood pressure medication since yesterday as well.  Denies polyuria, polyphagia, polydipsia, chest pain, shortness of breath.   ED/Hospital notes reviewed.    ROS:   Constitutional:  No f/c, No night sweats, No unexplained weight loss. EENT:  No vision changes, No blurry vision, No hearing changes. No mouth, throat, or ear problems.  Respiratory: No cough, No SOB Cardiac: No CP, no palpitations GI:  No abd pain, No N/V/D. GU: No Urinary s/sx Musculoskeletal: No joint pain Neuro: No headache, no dizziness, no motor weakness.  Endocrine:  No polydipsia. No polyuria.  Psych: Denies SI/HI  No problems updated.  ALLERGIES: Allergies  Allergen Reactions   Lisinopril Swelling and Other (See Comments)    Lips and mouth became swollen   Penicillins Itching, Swelling and Rash    PAST MEDICAL HISTORY: Past Medical History:  Diagnosis Date   Depression    Diabetes ([place])    Essential hypertension 02/03/2017   Hypercholesteremia    Hypertension     MEDICATIONS AT HOME: Prior to Admission medications   Medication Sig Start Date End Date Taking? Authorizing Provider  acetaminophen (TYLENOL) 500 MG tablet Take 1,000 mg by mouth every 6 (six) hours as needed for mild pain.   Yes [provider]  aspirin 325 MG EC tablet Take 325 mg by mouth daily.   Yes [provider]  atorvastatin (LIPITOR) 20 MG tablet Take 1 tablet (20 mg total) by mouth daily. 10/24/20  Yes Kerin Perna, NP  benzonatate (TESSALON PERLES) 100 MG capsule Take 1  capsule (100 mg total) by mouth 3 (three) times daily as needed for cough. 01/07/21 01/07/22 Yes Rudolpho Sevin, NP  blood glucose meter kit and supplies KIT Dispense based on patient and insurance preference. Use up to four times daily as directed. (FOR ICD-9 250.00, 250.01). 03/13/20  Yes Hagler, Aaron Edelman, MD  Blood Glucose Monitoring Suppl (TRUE METRIX METER) DEVI 1 kit by Does not apply route daily. Use as instructed to check blood sugar daily. 08/09/19  Yes Charlott Rakes, MD  glipiZIDE (GLUCOTROL) 10 MG tablet Take 1 tablet (10 mg total) by mouth 2 (two) times daily before a meal. 10/24/20  Yes Edwards, Michelle P, NP  glucose blood (TRUE METRIX BLOOD GLUCOSE TEST) test strip Use as instructed to check blood sugar daily. 08/09/19  Yes Newlin, Charlane Ferretti, MD  insulin aspart (NOVOLOG) 100 UNIT/ML injection Inject 10 Units into the skin 3 (three) times daily with meals. Sliding scale  blood sugars 200-250 give 4 units of insulin, 251-300 give 6 units, 300-350 give 8 units, 351-400 give 10units, 351-400 ,> 400 give 12 units and call M.D. Discussed hypoglycemia protocol. 10/24/20  Yes Kerin Perna, NP  insulin detemir (LEVEMIR) 100 unit/ml SOLN Inject 13 units under the skin twice per day. 03/27/21  Yes Feliberto Gottron, FNP  metFORMIN (GLUCOPHAGE) 1000 MG tablet Take 1 tablet (1,000 mg total) by mouth 2 (two) times daily with a meal. 10/24/20  Yes Kerin Perna, NP  potassium chloride (KLOR-CON)  10 MEQ tablet Take 10 mEq by mouth daily as needed ("When I feel dehydrated").   Yes [provider]  TRUEplus Lancets 28G MISC Use as instructed to check blood sugar daily. 08/09/19  Yes Charlott Rakes, MD  amLODipine (NORVASC) 10 MG tablet Take 1 tablet (10 mg total) by mouth daily. 03/27/21   Feliberto Gottron, FNP  losartan (COZAAR) 25 MG tablet Take 1 tablet (25 mg total) by mouth daily. 03/27/21   Feliberto Gottron, FNP     Objective:  EXAM:   Vitals:   03/27/21 1500  BP: (!) 146/102   Pulse: 86  Temp: 98.5 F (36.9 C)  TempSrc: Oral  SpO2: 97%  Weight: 218 lb 6.4 oz (99.1 kg)  Height: 5' 8" (1.727 m)    General appearance : A&OX3. NAD. Non-toxic-appearing HEENT: Atraumatic and Normocephalic.  PERRLA. EOM intact.  TM clear B. Mouth-MMM, post pharynx WNL w/o erythema, No PND. Chest/Lungs:  Breathing-non-labored, Good air entry bilaterally, breath sounds normal without rales, rhonchi, or wheezing  CVS: S1 S2 regular, no murmurs, gallops, rubs  Abdomen: Bowel sounds present, Non tender and not distended with no gaurding, rigidity or rebound. Extremities: Bilateral Lower Ext shows no edema, both legs are warm to touch with = pulse throughout Psych:  TP linear. J/I WNL. Normal speech. Appropriate eye contact and affect.   Data Review Lab Results  Component Value Date   HGBA1C 8.5 (A) 03/27/2021   HGBA1C 8.5 (A) 10/24/2020   HGBA1C 8.7 (A) 06/19/2020     Assessment & Plan   1. Hospital discharge follow-up -Medications as prescribed -Stay hydrated  2. Type 2 diabetes mellitus with hyperlipidemia ([facility]) -Monitor blood sugar 3 times a day at least -Report any blood sugar less than 80 mg/dL or more than 249 mg/dL to the clinic or local ED - HgB A1c - Glucose (CBG) - insulin detemir (LEVEMIR) 100 unit/ml SOLN; Inject 13 units under the skin twice per day.  Dispense: 5 mL; Refill: 3   3. Essential hypertension, benign - losartan (COZAAR) 25 MG tablet; Take 1 tablet (25 mg total) by mouth daily.  Dispense: 30 tablet; Refill: 6 - amLODipine (NORVASC) 10 MG tablet; Take 1 tablet (10 mg total) by mouth daily.  Dispense: 90 tablet; Refill: 1  4. Medication refill -Take medications as prescribed - amLODipine (NORVASC) 10 MG tablet; Take 1 tablet (10 mg total) by mouth daily.  Dispense: 90 tablet; Refill: 1     Patient have been counseled extensively about nutrition and exercise  Return in about 4 weeks (around 04/24/2021) for PCP for diabetes.  The patient was  given clear instructions to go to ER or return to medical center if symptoms don't improve, worsen or new problems develop. The patient verbalized understanding. The patient was told to call to get lab results if they haven't heard anything in the next week.     Feliberto Gottron, APRN, FNP-C [facility] [place], [place]   03/27/2021, 3:36 PM

## 2021-03-27 NOTE — Patient Instructions (Addendum)
Inject 10 Units into the skin 3 (three) times daily with meals. Sliding scale  blood sugars 200-250 give 4 units of insulin, 251-300 give 6 units, 300-350 give 8 units, 351-400 give 10units, 351-400 ,> 400 give 12 units and call M.D.  Educated on hypoglycemia control.  Verbalized understanding    Check BG TID at least Take medications as prescribed Report BG less than 80 or great than 249 to the clinic or local ED Follow up in 30 with PCP Take Levemir 13 units SQ twice daily

## 2021-03-27 NOTE — ED Triage Notes (Signed)
Pt states he has two abscesses on his buttocks, pt states he noticed them yesterday. Pt states they are painful.

## 2021-03-28 MED ORDER — KETOROLAC TROMETHAMINE 60 MG/2ML IM SOLN
30.0000 mg | Freq: Once | INTRAMUSCULAR | Status: AC
  Administered 2021-03-28: 30 mg via INTRAMUSCULAR
  Filled 2021-03-28: qty 2

## 2021-03-28 MED ORDER — SULFAMETHOXAZOLE-TRIMETHOPRIM 800-160 MG PO TABS: 1.0000 | ORAL_TABLET | Freq: Two times a day (BID) | ORAL | 0 refills | Status: AC

## 2021-03-28 MED ORDER — LIDOCAINE-EPINEPHRINE (PF) 2 %-1:200000 IJ SOLN
10.0000 mL | Freq: Once | INTRAMUSCULAR | Status: AC
  Administered 2021-03-28: 10 mL
  Filled 2021-03-28: qty 20

## 2021-03-28 MED ORDER — IBUPROFEN 600 MG PO TABS: 600.0000 mg | ORAL_TABLET | Freq: Four times a day (QID) | ORAL | 0 refills | Status: DC | PRN

## 2021-03-28 MED ORDER — SULFAMETHOXAZOLE-TRIMETHOPRIM 800-160 MG PO TABS
1.0000 | ORAL_TABLET | Freq: Once | ORAL | Status: AC
  Administered 2021-03-28: 1 via ORAL
  Filled 2021-03-28: qty 1

## 2021-03-28 NOTE — ED Provider Notes (Signed)
[facility] DEPT Provider Note   CSN: [id number] Arrival date & time: 03/27/21  1944     History Chief Complaint  Patient presents with   Abscess    Brandon Mcneil is a 40 y.o. male with history of diabetes mellitus type 2, HTN, bipolar 1 disorder, hypercholesteremia who presents the emergency department with a chief complaint of abscess.  The patient reports that he has noted to painful areas on his right buttock over the last few days.  He reports that the superior area was noticed approximately 1 week ago, and the second area.  Approximately 3 days ago.  He reports constant, worsening pain.  Pain is worse with sitting.  Improved with not applying pressure.  He denies fever, chills, rectal pain, vomiting, perineal pain, penile or testicular pain or swelling.  He reports that he previously had to have a perianal lesion drained in the OR.  He reports that his only treatment prior to arrival was applying soft fatback meat to the area with no improvement.  He reports that his blood sugars have been running in the 100-200s.  The history is provided by the patient and medical records. No language interpreter was used.      Past Medical History:  Diagnosis Date   Depression    Diabetes ([place])    Essential hypertension 02/03/2017   Hypercholesteremia    Hypertension     Patient Active Problem List   Diagnosis Date Noted   Abscess 08/02/2020   Stress 02/08/2017   Essential hypertension 02/03/2017   Type 2 diabetes mellitus with hyperlipidemia ([place]) 02/03/2017   Medication overdose 10/31/2013   Bipolar I disorder, most recent episode depressed ([place]) 09/11/2013   Tobacco use disorder 09/11/2013   Newly diagnosed diabetes ([place]) 09/11/2013   Dehydration 09/11/2013   Pain, dental 09/11/2013   DKA (diabetic ketoacidoses) 09/10/2013    Past Surgical History:  Procedure Laterality Date   INCISION AND DRAINAGE PERIRECTAL ABSCESS N/A 08/02/2020    Procedure: IRRIGATION AND DEBRIDEMENT PERIRECTAL ABSCESS;  Surgeon: Stark Klein, MD;  Location: [facility];  Service: General;  Laterality: N/A;       Family History  Problem Relation Age of Onset   Healthy Mother    Healthy Father     Social History   Tobacco Use   Smoking status: Every Day    Packs/day: 0.50    Years: 14.00    Pack years: 7.00    Types: Cigarettes   Smokeless tobacco: Never   Tobacco comments:    1/2 pack a day  Substance Use Topics   Alcohol use: Yes    Alcohol/week: 2.0 standard drinks    Types: 2 Cans of beer per week    Comment: occasional   Drug use: No    Types: Marijuana    Comment: [employer] former user.  Last use in September 2017    Home Medications Prior to Admission medications   Medication Sig Start Date End Date Taking? Authorizing Provider  amLODipine (NORVASC) 10 MG tablet Take 1 tablet (10 mg total) by mouth daily. 03/27/21  Yes Feliberto Gottron, FNP  aspirin 81 MG EC tablet Take 81 mg by mouth daily.   Yes [provider]  blood glucose meter kit and supplies KIT Dispense based on patient and insurance preference. Use up to four times daily as directed. (FOR ICD-9 250.00, 250.01). 03/13/20  Yes Hagler, Aaron Edelman, MD  Blood Glucose Monitoring Suppl (TRUE METRIX METER) DEVI 1 kit by Does not apply  route daily. Use as instructed to check blood sugar daily. 08/09/19  Yes Charlott Rakes, MD  glipiZIDE (GLUCOTROL) 10 MG tablet Take 1 tablet (10 mg total) by mouth 2 (two) times daily before a meal. 10/24/20  Yes Edwards, Michelle P, NP  glucose blood (TRUE METRIX BLOOD GLUCOSE TEST) test strip Use as instructed to check blood sugar daily. 08/09/19  Yes Charlott Rakes, MD  ibuprofen (ADVIL) 600 MG tablet Take 1 tablet (600 mg total) by mouth every 6 (six) hours as needed. 03/28/21  Yes Guy Toney A, PA-C  insulin detemir (LEVEMIR) 100 unit/ml SOLN Inject 13 units under the skin twice per day. 03/27/21  Yes Feliberto Gottron, FNP  losartan (COZAAR) 25  MG tablet Take 1 tablet (25 mg total) by mouth daily. 03/27/21  Yes Feliberto Gottron, FNP  metFORMIN (GLUCOPHAGE) 1000 MG tablet Take 1 tablet (1,000 mg total) by mouth 2 (two) times daily with a meal. 10/24/20  Yes Kerin Perna, NP  potassium chloride (KLOR-CON) 10 MEQ tablet Take 10 mEq by mouth daily as needed ("When I feel dehydrated").   Yes [provider]  sulfamethoxazole-trimethoprim (BACTRIM DS) 800-160 MG tablet Take 1 tablet by mouth 2 (two) times daily for 7 days. 03/28/21 04/04/21 Yes Ieshia Hatcher A, PA-C  TRUEplus Lancets 28G MISC Use as instructed to check blood sugar daily. 08/09/19  Yes Charlott Rakes, MD  acetaminophen (TYLENOL) 500 MG tablet Take 1,000 mg by mouth every 6 (six) hours as needed for mild pain.    [provider]  atorvastatin (LIPITOR) 20 MG tablet Take 1 tablet (20 mg total) by mouth daily. 10/24/20   Kerin Perna, NP  insulin aspart (NOVOLOG) 100 UNIT/ML injection Inject 10 Units into the skin 3 (three) times daily with meals. Sliding scale  blood sugars 200-250 give 4 units of insulin, 251-300 give 6 units, 300-350 give 8 units, 351-400 give 10units, 351-400 ,> 400 give 12 units and call M.D. Discussed hypoglycemia protocol. Patient not taking: Reported on 03/28/2021 10/24/20   Kerin Perna, NP    Allergies    Lisinopril and Penicillins  Review of Systems   Review of Systems  Constitutional:  Negative for appetite change, diaphoresis, fatigue and fever.  HENT:  Negative for congestion and sore throat.   Respiratory:  Negative for shortness of breath.   Cardiovascular:  Negative for chest pain.  Gastrointestinal:  Negative for abdominal pain, constipation, diarrhea, nausea, rectal pain and vomiting.  Genitourinary:  Negative for dysuria, penile discharge, penile pain, penile swelling, scrotal swelling and urgency.  Musculoskeletal:  Positive for myalgias. Negative for back pain, joint swelling, neck pain and neck stiffness.   Skin:  Positive for wound. Negative for color change and rash.  Allergic/Immunologic: Negative for immunocompromised state.  Neurological:  Negative for dizziness, seizures, syncope, weakness, numbness and headaches.  Psychiatric/Behavioral:  Negative for confusion.    Physical Exam Updated Vital Signs BP (!) 153/89   Pulse 70   Temp 98 F (36.7 C) (Oral)   Resp 16   SpO2 96%   Physical Exam Vitals and nursing note reviewed.  Constitutional:      General: He is not in acute distress.    Appearance: He is well-developed. He is obese. He is not ill-appearing, toxic-appearing or diaphoretic.  HENT:     Head: Normocephalic.  Eyes:     Conjunctiva/sclera: Conjunctivae normal.  Cardiovascular:     Rate and Rhythm: Normal rate and regular rhythm.     Heart sounds: No  murmur heard. Pulmonary:     Effort: Pulmonary effort is normal. No respiratory distress.     Breath sounds: No stridor. No wheezing, rhonchi or rales.  Chest:     Chest wall: No tenderness.  Abdominal:     General: There is no distension.     Palpations: Abdomen is soft.  Musculoskeletal:     Cervical back: Neck supple.       Legs:  Skin:    General: Skin is warm and dry.  Neurological:     Mental Status: He is alert.  Psychiatric:        Behavior: Behavior normal.    ED Results / Procedures / Treatments   Labs (all labs ordered are listed, but only abnormal results are displayed) Labs Reviewed  CBC WITH DIFFERENTIAL/PLATELET - Abnormal; Notable for the following components:      Result Value   WBC 11.0 (*)    HCT 36.2 (*)    Neutro Abs 8.3 (*)    All other components within normal limits  BASIC METABOLIC PANEL - Abnormal; Notable for the following components:   Sodium 133 (*)    Potassium 3.4 (*)    Glucose, Bld 226 (*)    All other components within normal limits  CBG MONITORING, ED - Abnormal; Notable for the following components:   Glucose-Capillary 258 (*)    All other components within  normal limits    EKG None  Radiology No results found.  Procedures Ultrasound ED Soft Tissue  Date/Time: 03/28/2021 8:32 AM Performed by: Joanne Gavel, PA-C Authorized by: Joanne Gavel, PA-C   Procedure details:    Indications: localization of abscess and evaluate for cellulitis     Transverse view:  Visualized   Longitudinal view:  Visualized   Images: archived   Location:    Location: buttocks     Side:  Right Findings:     abscess present    cellulitis present Comments:     There are 2 lesions noted to the patient's right buttock.  There is cobblestoning consistent with cellulitis noted to the superior buttock.  There is an area of fluid that is amenable to IR drainage near the inferior portion. Marland Kitchen.Incision and Drainage  Date/Time: 03/28/2021 8:33 AM Performed by: Joanne Gavel, PA-C Authorized by: Joanne Gavel, PA-C   Consent:    Consent obtained:  Verbal   Consent given by:  Patient   Risks, benefits, and alternatives were discussed: yes     Risks discussed:  Bleeding, damage to other organs, infection, incomplete drainage and pain   Alternatives discussed:  No treatment and alternative treatment Universal protocol:    Procedure explained and questions answered to patient or proxy's satisfaction: yes     Patient identity confirmed:  Verbally with patient and arm band Location:    Type:  Abscess   Size:  2 cm   Location: right buttock. Pre-procedure details:    Skin preparation:  Povidone-iodine Sedation:    Sedation type:  None Anesthesia:    Anesthesia method:  Local infiltration   Local anesthetic:  Lidocaine 2% WITH epi Procedure type:    Complexity:  Simple Procedure details:    Ultrasound guidance: yes     Needle aspiration: no     Incision types:  Single straight   Incision depth:  Dermal   Wound management:  Irrigated with saline   Drainage:  Purulent and bloody   Drainage amount:  Moderate   Wound treatment:  Wound  left open    Packing materials:  None Post-procedure details:    Procedure completion:  Tolerated well, no immediate complications   Medications Ordered in ED Medications  ketorolac (TORADOL) injection 30 mg (30 mg Intramuscular Given 03/28/21 0215)  lidocaine-EPINEPHrine (XYLOCAINE W/EPI) 2 %-1:200000 (PF) injection 10 mL (10 mLs Infiltration Given 03/28/21 0250)  sulfamethoxazole-trimethoprim (BACTRIM DS) 800-160 MG per tablet 1 tablet (1 tablet Oral Given 03/28/21 0502)    ED Course  I have reviewed the triage vital signs and the nursing notes.  Pertinent labs & imaging results that were available during my care of the patient were reviewed by me and considered in my medical decision making (see chart for details).    MDM Rules/Calculators/A&P                           40 year old male with history of diabetes mellitus type 2, HTN, bipolar 1 disorder, hypercholesteremia who presents to the emergency department with 2 abscesses noted to the right buttock that have developed over the last few days.  He has a history of previous I&D in the OR to previous perianal abscess.  There was a single documented fever of 100.9 on arrival, but when rechecked he had no fever and had not received any antipyretics.  He has been denying any constitutional symptoms over the last few days.  Home blood sugar checks have been running between 100-200.  Labs and imaging have been reviewed and independently interpreted by me.  Minimal leukocytosis of 11.  Glucose is 226.  No evidence of DKA or HHS.  Bedside ultrasound was performed with abscess located near the inferior lesion.  Risk and benefits discussed and patient was amenable to I&D.  No extension of perianal lesion.  I have a low suspicion for Fournier's gangrene or deep space infection in the pelvis.  Bedside ultrasound of the severe lesion was more consistent with cellulitis.  She tolerated the procedure without difficulty and a moderate amount of purulent drainage was  obtained as well as sanguinous drainage.  He reported significant improvement of pain following the procedure.  Given the patient's history and risk factors, will cover the patient with Bactrim.  I have instructed the patient to call general surgery and schedule a follow-up appointment in the office for a wound recheck in 3 to 4 days.  Recommended sitz bath and discussed other home supportive measures.  All questions answered.  First dose of Bactrim given in the ED.  He is hemodynamically stable in no acute distress.  Safe for discharge home with outpatient follow-up as discussed.    Final Clinical Impression(s) / ED Diagnoses Final diagnoses:  Abscess, gluteal, right  Cellulitis, gluteal, right    Rx / DC Orders ED Discharge Orders          Ordered    sulfamethoxazole-trimethoprim (BACTRIM DS) 800-160 MG tablet  2 times daily        03/28/21 0430    ibuprofen (ADVIL) 600 MG tablet  Every 6 hours PRN        03/28/21 0432             Joanne Gavel, PA-C 03/28/21 5732    Quintella Reichert, MD 03/28/21 [phone]

## 2021-03-28 NOTE — Discharge Instructions (Addendum)
Thank you for allowing me to care for you today in the Emergency Department.   Please check your blood sugar when you get home tonight.  Take 1 tablet of Bactrim 2 times daily for the next 7 days.  This is an antibiotic to treat infection.  Take 650 mg of Tylenol or 600 mg of ibuprofen with food every 6 hours for pain.  You can alternate between these 2 medications every 3 hours if your pain returns.  For instance, you can take Tylenol at noon, followed by a dose of ibuprofen at 3, followed by second dose of Tylenol and 6.  Sitz bath are available over-the-counter.  Start using these as directed on the label, which is typically 3-4 times a day.  This will help with pain and swelling.  Make sure you call [facility] today to see if they can schedule you an appointment for reevaluation on Monday or Tuesday.  Return to the emergency department if you start having fever, temperature 100.4 degrees or higher, chills, severe pain around your rectum, pain in your groin or scrotum, severe abdominal pain, uncontrollable vomiting, or other new, concerning symptoms.  Marland Kitchen

## 2021-06-16 ENCOUNTER — Ambulatory Visit (INDEPENDENT_AMBULATORY_CARE_PROVIDER_SITE_OTHER): Payer: Self-pay | Admitting: Primary Care

## 2021-06-16 ENCOUNTER — Other Ambulatory Visit: Payer: Self-pay

## 2021-06-16 ENCOUNTER — Encounter (INDEPENDENT_AMBULATORY_CARE_PROVIDER_SITE_OTHER): Payer: Self-pay | Admitting: Primary Care

## 2021-06-16 DIAGNOSIS — I10 Essential (primary) hypertension: Secondary | ICD-10-CM

## 2021-06-16 DIAGNOSIS — Z760 Encounter for issue of repeat prescription: Secondary | ICD-10-CM

## 2021-06-16 MED ORDER — AMLODIPINE BESYLATE 10 MG PO TABS: 10.0000 mg | ORAL_TABLET | Freq: Every day | ORAL | 1 refills | Status: DC

## 2021-06-16 MED ORDER — LOSARTAN POTASSIUM-HCTZ 100-25 MG PO TABS: 1.0000 | ORAL_TABLET | Freq: Every day | ORAL | 1 refills | Status: DC

## 2021-06-16 NOTE — Progress Notes (Signed)
Brandon Mcneil is a 40 y.o. male presents for hypertension evaluation, Denies shortness of breath, headaches, chest pain or lower extremity edema, sudden onset, vision changes, unilateral weakness, dizziness, paresthesias   Patient reports mostly adherence with medications.  Dietary habits include: monitoring sodium intake  Exercise habits include:yes  Family / Social history: No   Past Medical History:  Diagnosis Date   Depression    Diabetes ([place])    Essential hypertension 02/03/2017   Hypercholesteremia    Hypertension    Past Surgical History:  Procedure Laterality Date   INCISION AND DRAINAGE PERIRECTAL ABSCESS N/A 08/02/2020   Procedure: IRRIGATION AND DEBRIDEMENT PERIRECTAL ABSCESS;  Surgeon: Stark Klein, MD;  Location: [facility];  Service: General;  Laterality: N/A;   Allergies  Allergen Reactions   Lisinopril Swelling and Other (See Comments)    Lips and mouth became swollen   Penicillins Itching, Swelling and Rash   Current Outpatient Medications on File Prior to Visit  Medication Sig Dispense Refill   acetaminophen (TYLENOL) 500 MG tablet Take 1,000 mg by mouth every 6 (six) hours as needed for mild pain.     amLODipine (NORVASC) 10 MG tablet Take 1 tablet (10 mg total) by mouth daily. 90 tablet 1   aspirin 81 MG EC tablet Take 81 mg by mouth daily.     atorvastatin (LIPITOR) 20 MG tablet Take 1 tablet (20 mg total) by mouth daily. 30 tablet 6   blood glucose meter kit and supplies KIT Dispense based on patient and insurance preference. Use up to four times daily as directed. (FOR ICD-9 250.00, 250.01). 1 each 0   Blood Glucose Monitoring Suppl (TRUE METRIX METER) DEVI 1 kit by Does not apply route daily. Use as instructed to check blood sugar daily. 1 each 0   glipiZIDE (GLUCOTROL) 10 MG tablet Take 1 tablet (10 mg total) by mouth 2 (two) times daily before a meal. 180 tablet 1   glucose blood (TRUE METRIX BLOOD GLUCOSE TEST) test  strip Use as instructed to check blood sugar daily. 100 each 11   ibuprofen (ADVIL) 600 MG tablet Take 1 tablet (600 mg total) by mouth every 6 (six) hours as needed. 30 tablet 0   insulin aspart (NOVOLOG) 100 UNIT/ML injection Inject 10 Units into the skin 3 (three) times daily with meals. Sliding scale  blood sugars 200-250 give 4 units of insulin, 251-300 give 6 units, 300-350 give 8 units, 351-400 give 10units, 351-400 ,> 400 give 12 units and call M.D. Discussed hypoglycemia protocol. (Patient not taking: Reported on 03/28/2021) 10 mL 3   insulin detemir (LEVEMIR) 100 unit/ml SOLN Inject 13 units under the skin twice per day. 5 mL 3   losartan (COZAAR) 25 MG tablet Take 1 tablet (25 mg total) by mouth daily. 30 tablet 6   metFORMIN (GLUCOPHAGE) 1000 MG tablet Take 1 tablet (1,000 mg total) by mouth 2 (two) times daily with a meal. 180 tablet 1   potassium chloride (KLOR-CON) 10 MEQ tablet Take 10 mEq by mouth daily as needed ("When I feel dehydrated").     TRUEplus Lancets 28G MISC Use as instructed to check blood sugar daily. 100 each 11   No current facility-administered medications on file prior to visit.   Social History   Socioeconomic History   Marital status: Single    Spouse name: Not on file   Number of children: 3   Years of education: 10th   Highest education level: Not  on file  Occupational History   Occupation: unemployed  Tobacco Use   Smoking status: Every Day    Packs/day: 0.50    Years: 14.00    Pack years: 7.00    Types: Cigarettes   Smokeless tobacco: Never   Tobacco comments:    1/2 pack a day  Substance and Sexual Activity   Alcohol use: Yes    Alcohol/week: 2.0 standard drinks    Types: 2 Cans of beer per week    Comment: occasional   Drug use: No    Types: Marijuana    Comment: [employer] former user.  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   Social Determinants of Health   Financial Resource Strain: Not on file  Food Insecurity: Not on file  Transportation Needs: Not on file  Physical Activity: Not on file  Stress: Not on file  Social Connections: Not on file  Intimate Partner Violence: Not on file   Family History  Problem Relation Age of Onset   Healthy Mother    Healthy Father      OBJECTIVE: BP (!) 160/103 (BP Location: Right Arm, Patient Position: Sitting, Cuff Size: Normal)   Pulse 91   Temp (!) 97.3 F (36.3 C) (Temporal)   Ht '5\' 8"'  (1.727 m)   Wt 217 lb (98.4 kg)   SpO2 96%   BMI 32.99 kg/m    Physical exam: General: Vital signs reviewed.  Patient is well-developed and well-nourished, obese male in no acute distress and cooperative with exam. Head: Normocephalic and atraumatic. Eyes: EOMI, conjunctivae normal, no scleral icterus. Neck: Supple, trachea midline, normal ROM, no JVD, masses, thyromegaly, or carotid bruit present. Cardiovascular: RRR, S1 normal, S2 normal, no murmurs, gallops, or rubs. Pulmonary/Chest: Clear to auscultation bilaterally, no wheezes, rales, or rhonchi. Abdominal: Soft, non-tender, non-distended, BS +, no masses, organomegaly, or guarding present. Musculoskeletal: No joint deformities, erythema, or stiffness, ROM full and nontender. Extremities: No lower extremity edema bilaterally,  pulses symmetric and intact bilaterally. No cyanosis or clubbing. Neurological: A&O x3, Strength is normal Skin: Warm, dry and intact. No rashes or erythema. Psychiatric: Normal mood and affect. speech and behavior is normal. Cognition and memory are normal.     Review of Systems  All other systems reviewed and are negative.  Last 3 Office BP readings: BP Readings from Last 3 Encounters:  03/28/21 (!) 153/89  03/27/21 (!) 146/102  02/03/21  (!) 156/93    BMET    Component Value Date/Time   NA 133 (L) 03/27/2021 2110   NA 138 06/19/2020 1146   K 3.4 (L) 03/27/2021 2110   CL 101 03/27/2021 2110   CO2 23 03/27/2021 2110   GLUCOSE 226 (H) 03/27/2021 2110   BUN 11 03/27/2021 2110   BUN 11 06/19/2020 1146   CREATININE 0.83 03/27/2021 2110   CALCIUM 9.1 03/27/2021 2110   GFRNONAA >60 03/27/2021 2110   GFRAA 117 06/19/2020 1146    Renal function: CrCl cannot be calculated (Patient's most recent lab result is older than the maximum 21 days allowed.).  Clinical ASCVD: Yes  The 10-year ASCVD risk score (Arnett DK, et al., 2019) is: 21.2%   Values used  to calculate the score:     Age: 71 years     Sex: Male     Is Non-Hispanic African American: Yes     Diabetic: Yes     Tobacco smoker: Yes     Systolic Blood Pressure: 224 mmHg     Is BP treated: Yes     HDL Cholesterol: 60 mg/dL     Total Cholesterol: 146 mg/dL  ASCVD risk factors include- [place]   ASSESSMENT & PLAN:  Brandon Mcneil was seen today for follow-up.  Diagnoses and all orders for this visit:  Medication refill/Other orders -     amLODipine (NORVASC) 10 MG tablet; Take 1 tablet (10 mg total) by mouth daily.    losartan-hydrochlorothiazide (HYZAAR) 100-25 MG tablet; Take 1 tablet by mouth daily.   -  Essential hypertension, benign -Counseled on lifestyle modifications for blood pressure control including reduced dietary sodium, increased exercise, weight reduction and adequate sleep. Also, educated patient about the risk for cardiovascular events, stroke and heart attack. Also counseled patient about the importance of medication adherence. If you participate in smoking, it is important to stop using tobacco as this will increase the risks associated with uncontrolled blood pressure.   -Hypertension longstanding diagnosed currently losartan 25m and amlodipine 121mboth daily  on current medications. Patient is adherent with current medications.   Goal BP:   For patients younger than 60: Goal BP < 130/80. For patients 60 and older: Goal BP < 140/90. For patients with diabetes: Goal BP < 130/80. Your most recent BP: 150/98  Minimize salt intake. Minimize alcohol intake  This note has been created with [employer]veyor, quantityAny transcriptional errors are unintentional.   MiKerin PernaNP 06/16/2021, 3:52 PM

## 2021-06-16 NOTE — Patient Instructions (Signed)
Influenza, Adult °Influenza is also called "the flu." It is an infection in the lungs, nose, and throat (respiratory tract). It spreads easily from person to person (is contagious). The flu causes symptoms that are like a cold, along with high fever and body aches. °What are the causes? °This condition is caused by the influenza virus. You can get the virus by: °Breathing in droplets that are in the air after a person infected with the flu coughed or sneezed. °Touching something that has the virus on it and then touching your mouth, nose, or eyes. °What increases the risk? °Certain things may make you more likely to get the flu. These include: °Not washing your hands often. °Having close contact with many people during cold and flu season. °Touching your mouth, eyes, or nose without first washing your hands. °Not getting a flu shot every year. °You may have a higher risk for the flu, and serious problems, such as a lung infection (pneumonia), if you: °Are older than 65. °Are pregnant. °Have a weakened disease-fighting system (immune system) because of a disease or because you are taking certain medicines. °Have a long-term (chronic) condition, such as: °Heart, kidney, or lung disease. °Diabetes. °Asthma. °Have a liver disorder. °Are very overweight (morbidly obese). °Have anemia. °What are the signs or symptoms? °Symptoms usually begin suddenly and last 4-14 days. They may include: °Fever and chills. °Headaches, body aches, or muscle aches. °Sore throat. °Cough. °Runny or stuffy (congested) nose. °Feeling discomfort in your chest. °Not wanting to eat as much as normal. °Feeling weak or tired. °Feeling dizzy. °Feeling sick to your stomach or throwing up. °How is this treated? °If the flu is found early, you can be treated with antiviral medicine. This can help to reduce how bad the illness is and how long it lasts. This may be given by mouth or through an IV tube. °Taking care of yourself at home can help your  symptoms get better. Your doctor may want you to: °Take over-the-counter medicines. °Drink plenty of fluids. °The flu often goes away on its own. If you have very bad symptoms or other problems, you may be treated in a hospital. °Follow these instructions at home: °  °Activity °Rest as needed. Get plenty of sleep. °Stay home from work or school as told by your doctor. °Do not leave home until you do not have a fever for 24 hours without taking medicine. °Leave home only to go to your doctor. °Eating and drinking °Take an ORS (oral rehydration solution). This is a drink that is sold at pharmacies and stores. °Drink enough fluid to keep your pee pale yellow. °Drink clear fluids in small amounts as you are able. Clear fluids include: °Water. °Ice chips. °Fruit juice mixed with water. °Low-calorie sports drinks. °Eat bland foods that are easy to digest. Eat small amounts as you are able. These foods include: °Bananas. °Applesauce. °Rice. °Lean meats. °Toast. °Crackers. °Do not eat or drink: °Fluids that have a lot of sugar or caffeine. °Alcohol. °Spicy or fatty foods. °General instructions °Take over-the-counter and prescription medicines only as told by your doctor. °Use a cool mist humidifier to add moisture to the air in your home. This can make it easier for you to breathe. °When using a cool mist humidifier, clean it daily. Empty water and replace with clean water. °Cover your mouth and nose when you cough or sneeze. °Wash your hands with soap and water often and for at least 20 seconds. This is also important after   you cough or sneeze. If you cannot use soap and water, use alcohol-based hand sanitizer. °Keep all follow-up visits. °How is this prevented? ° °Get a flu shot every year. You may get the flu shot in late summer, fall, or winter. Ask your doctor when you should get your flu shot. °Avoid contact with people who are sick during fall and winter. This is cold and flu season. °Contact a doctor if: °You get  new symptoms. °You have: °Chest pain. °Watery poop (diarrhea). °A fever. °Your cough gets worse. °You start to have more mucus. °You feel sick to your stomach. °You throw up. °Get help right away if you: °Have shortness of breath. °Have trouble breathing. °Have skin or nails that turn a bluish color. °Have very bad pain or stiffness in your neck. °Get a sudden headache. °Get sudden pain in your face or ear. °Cannot eat or drink without throwing up. °These symptoms may represent a serious problem that is an emergency. Get medical help right away. Call your local emergency services (911 in the [place] °Do not wait to see if the symptoms will go away. °Do not drive yourself to the hospital. °Summary °Influenza is also called "the flu." It is an infection in the lungs, nose, and throat. It spreads easily from person to person. °Take over-the-counter and prescription medicines only as told by your doctor. °Getting a flu shot every year is the best way to not get the flu. °This information is not intended to replace advice given to you by your health care provider. Make sure you discuss any questions you have with your health care provider. °Document Revised: 03/22/2020 Document Reviewed: 03/22/2020 °Elsevier Patient Education © 2022 [employer] ° °

## 2021-07-07 ENCOUNTER — Ambulatory Visit (INDEPENDENT_AMBULATORY_CARE_PROVIDER_SITE_OTHER): Payer: Self-pay | Admitting: Primary Care

## 2021-07-12 ENCOUNTER — Encounter (HOSPITAL_COMMUNITY): Payer: Self-pay | Admitting: Emergency Medicine

## 2021-07-12 ENCOUNTER — Other Ambulatory Visit: Payer: Self-pay

## 2021-07-12 ENCOUNTER — Emergency Department (HOSPITAL_COMMUNITY)
Admission: EM | Admit: 2021-07-12 | Discharge: 2021-07-12 | Disposition: A | Discharge status: Home / Self Care | Payer: Self-pay | Attending: Emergency Medicine | Admitting: Emergency Medicine

## 2021-07-12 DIAGNOSIS — F17210 Nicotine dependence, cigarettes, uncomplicated: Secondary | ICD-10-CM | POA: Insufficient documentation

## 2021-07-12 DIAGNOSIS — I10 Essential (primary) hypertension: Secondary | ICD-10-CM | POA: Insufficient documentation

## 2021-07-12 DIAGNOSIS — K047 Periapical abscess without sinus: Secondary | ICD-10-CM | POA: Insufficient documentation

## 2021-07-12 DIAGNOSIS — Z7984 Long term (current) use of oral hypoglycemic drugs: Secondary | ICD-10-CM | POA: Insufficient documentation

## 2021-07-12 DIAGNOSIS — Z794 Long term (current) use of insulin: Secondary | ICD-10-CM | POA: Insufficient documentation

## 2021-07-12 DIAGNOSIS — E1110 Type 2 diabetes mellitus with ketoacidosis without coma: Secondary | ICD-10-CM | POA: Insufficient documentation

## 2021-07-12 DIAGNOSIS — Z7982 Long term (current) use of aspirin: Secondary | ICD-10-CM | POA: Insufficient documentation

## 2021-07-12 DIAGNOSIS — Z79899 Other long term (current) drug therapy: Secondary | ICD-10-CM | POA: Insufficient documentation

## 2021-07-12 MED ORDER — OXYCODONE HCL 5 MG PO TABS
5.0000 mg | ORAL_TABLET | Freq: Once | ORAL | Status: AC
  Administered 2021-07-12: 5 mg via ORAL
  Filled 2021-07-12: qty 1

## 2021-07-12 MED ORDER — CLINDAMYCIN HCL 150 MG PO CAPS: 450.0000 mg | ORAL_CAPSULE | Freq: Four times a day (QID) | ORAL | 0 refills | Status: AC

## 2021-07-12 MED ORDER — KETOROLAC TROMETHAMINE 15 MG/ML IJ SOLN
15.0000 mg | Freq: Once | INTRAMUSCULAR | Status: AC
  Administered 2021-07-12: 15 mg via INTRAMUSCULAR
  Filled 2021-07-12: qty 1

## 2021-07-12 MED ORDER — LIDOCAINE HCL (PF) 1 % IJ SOLN
0.0000 mL | Freq: Once | INTRAMUSCULAR | Status: DC | PRN
  Filled 2021-07-12: qty 5

## 2021-07-12 MED ORDER — LIDOCAINE HCL (PF) 1 % IJ SOLN: 10.0000 mL | Freq: Once | INTRAMUSCULAR | Status: DC

## 2021-07-12 MED ORDER — CLINDAMYCIN HCL 150 MG PO CAPS
450.0000 mg | ORAL_CAPSULE | Freq: Once | ORAL | Status: AC
  Administered 2021-07-12: 450 mg via ORAL
  Filled 2021-07-12: qty 3

## 2021-07-12 NOTE — ED Triage Notes (Signed)
C/o front upper dental pain x 4 days with swelling to gums and face.

## 2021-07-12 NOTE — ED Provider Notes (Signed)
[facility] EMERGENCY DEPARTMENT Provider Note   CSN: [id number] Arrival date & time: 07/12/21  1122     History Chief Complaint  Patient presents with   Dental Pain    Brandon Mcneil is a 40 y.o. male.  40 yo M with a chief complaints of dental abscess.  Developed over 4 days or so.  Tells me that he bit into a chicken bone and felt like his tooth pushed somewhat into the gumline.  Denies any fevers or chills.  Feels like he has had some facial swelling with this.  Has not seen a dentist in some time.  No difficulty swallowing or breathing.  The history is provided by the patient.  Dental Pain Location:  Upper Upper teeth location:  9/LU central incisor Quality:  Aching Severity:  Moderate Onset quality:  Gradual Duration:  4 days Timing:  Constant Progression:  Worsening Chronicity:  New Context: abscess   Relieved by:  Nothing Worsened by:  Nothing Ineffective treatments:  None tried Associated symptoms: no congestion, no facial swelling, no fever and no headaches       Past Medical History:  Diagnosis Date   Depression    Diabetes ([place])    Essential hypertension 02/03/2017   Hypercholesteremia    Hypertension     Patient Active Problem List   Diagnosis Date Noted   Abscess 08/02/2020   Stress 02/08/2017   Essential hypertension 02/03/2017   Type 2 diabetes mellitus with hyperlipidemia ([place]) 02/03/2017   Medication overdose 10/31/2013   Bipolar I disorder, most recent episode depressed ([place]) 09/11/2013   Tobacco use disorder 09/11/2013   Newly diagnosed diabetes ([place]) 09/11/2013   Dehydration 09/11/2013   Pain, dental 09/11/2013   DKA (diabetic ketoacidoses) 09/10/2013    Past Surgical History:  Procedure Laterality Date   INCISION AND DRAINAGE PERIRECTAL ABSCESS N/A 08/02/2020   Procedure: IRRIGATION AND DEBRIDEMENT PERIRECTAL ABSCESS;  Surgeon: Stark Klein, MD;  Location: [facility];  Service: General;  Laterality: N/A;        Family History  Problem Relation Age of Onset   Healthy Mother    Healthy Father     Social History   Tobacco Use   Smoking status: Every Day    Packs/day: 0.50    Years: 14.00    Pack years: 7.00    Types: Cigarettes   Smokeless tobacco: Never   Tobacco comments:    1/2 pack a day  Substance Use Topics   Alcohol use: Yes    Alcohol/week: 2.0 standard drinks    Types: 2 Cans of beer per week    Comment: occasional   Drug use: No    Types: Marijuana    Comment: [employer] former user.  Last use in September 2017    Home Medications Prior to Admission medications   Medication Sig Start Date End Date Taking? Authorizing Provider  clindamycin (CLEOCIN) 150 MG capsule Take 3 capsules (450 mg total) by mouth every 6 (six) hours for 7 days. 07/12/21 07/19/21 Yes Deno Etienne, DO  acetaminophen (TYLENOL) 500 MG tablet Take 1,000 mg by mouth every 6 (six) hours as needed for mild pain.    [provider]  amLODipine (NORVASC) 10 MG tablet Take 1 tablet (10 mg total) by mouth daily. 06/16/21   Kerin Perna, NP  aspirin 81 MG EC tablet Take 81 mg by mouth daily.    [provider]  atorvastatin (LIPITOR) 20 MG tablet Take 1 tablet (20 mg total) by  mouth daily. 10/24/20   Kerin Perna, NP  blood glucose meter kit and supplies KIT Dispense based on patient and insurance preference. Use up to four times daily as directed. (FOR ICD-9 250.00, 250.01). 03/13/20   Vanessa Kick, MD  Blood Glucose Monitoring Suppl (TRUE METRIX METER) DEVI 1 kit by Does not apply route daily. Use as instructed to check blood sugar daily. 08/09/19   Charlott Rakes, MD  glipiZIDE (GLUCOTROL) 10 MG tablet Take 1 tablet (10 mg total) by mouth 2 (two) times daily before a meal. 10/24/20   Kerin Perna, NP  glucose blood (TRUE METRIX BLOOD GLUCOSE TEST) test strip Use as instructed to check blood sugar daily. 08/09/19   Charlott Rakes, MD  ibuprofen (ADVIL) 600 MG tablet Take 1  tablet (600 mg total) by mouth every 6 (six) hours as needed. 03/28/21   McDonald, Mia A, PA-C  insulin aspart (NOVOLOG) 100 UNIT/ML injection Inject 10 Units into the skin 3 (three) times daily with meals. Sliding scale  blood sugars 200-250 give 4 units of insulin, 251-300 give 6 units, 300-350 give 8 units, 351-400 give 10units, 351-400 ,> 400 give 12 units and call M.D. Discussed hypoglycemia protocol. Patient taking differently: Inject 10 Units into the skin 3 (three) times daily with meals. Sliding scale  blood sugars 200-250 give 4 units of insulin, 251-300 give 6 units, 300-350 give 8 units, 351-400 give 10units, 351-400 ,> 400 give 12 units and call M.D. Discussed hypoglycemia protocol. 10/24/20   Kerin Perna, NP  insulin detemir (LEVEMIR) 100 unit/ml SOLN Inject 13 units under the skin twice per day. 03/27/21   Feliberto Gottron, FNP  losartan-hydrochlorothiazide (HYZAAR) 100-25 MG tablet Take 1 tablet by mouth daily. 06/16/21   Kerin Perna, NP  metFORMIN (GLUCOPHAGE) 1000 MG tablet Take 1 tablet (1,000 mg total) by mouth 2 (two) times daily with a meal. 10/24/20   Kerin Perna, NP  TRUEplus Lancets 28G MISC Use as instructed to check blood sugar daily. 08/09/19   Charlott Rakes, MD    Allergies    Lisinopril and Penicillins  Review of Systems   Review of Systems  Constitutional:  Negative for chills and fever.  HENT:  Positive for dental problem. Negative for congestion and facial swelling.   Eyes:  Negative for discharge and visual disturbance.  Respiratory:  Negative for shortness of breath.   Cardiovascular:  Negative for chest pain and palpitations.  Gastrointestinal:  Negative for abdominal pain, diarrhea and vomiting.  Musculoskeletal:  Negative for arthralgias and myalgias.  Skin:  Negative for color change and rash.  Neurological:  Negative for tremors, syncope and headaches.  Psychiatric/Behavioral:  Negative for confusion and dysphoric mood.     Physical Exam Updated Vital Signs BP (!) 145/95 (BP Location: Left Arm)   Pulse 80   Temp 98.7 F (37.1 C) (Oral)   Resp 17   SpO2 99%   Physical Exam Vitals and nursing note reviewed.  Constitutional:      Appearance: He is well-developed.  HENT:     Head: Normocephalic and atraumatic.     Mouth/Throat:     Comments: Left upper central incisor with some subluxation.  Appears to be in good position.  Purulent drainage noted at the base of the tooth as well as an abscess is formed at the gumline adjacent to the root both front and back.  No significant facial edema.  Tolerating secretions without difficulty. Eyes:     Pupils: Pupils are equal,  round, and reactive to light.  Neck:     Vascular: No JVD.  Cardiovascular:     Rate and Rhythm: Normal rate and regular rhythm.     Heart sounds: No murmur heard.   No friction rub. No gallop.  Pulmonary:     Effort: No respiratory distress.     Breath sounds: No wheezing.  Abdominal:     General: There is no distension.     Tenderness: There is no abdominal tenderness. There is no guarding or rebound.  Musculoskeletal:        General: Normal range of motion.     Cervical back: Normal range of motion and neck supple.  Skin:    Coloration: Skin is not pale.     Findings: No rash.  Neurological:     Mental Status: He is alert and oriented to person, place, and time.  Psychiatric:        Behavior: Behavior normal.    ED Results / Procedures / Treatments   Labs (all labs ordered are listed, but only abnormal results are displayed) Labs Reviewed - No data to display  EKG None  Radiology No results found.  Procedures Procedures   Medications Ordered in ED Medications  lidocaine (PF) (XYLOCAINE) 1 % injection 0-20 mL (has no administration in time range)  clindamycin (CLEOCIN) capsule 450 mg (has no administration in time range)  ketorolac (TORADOL) 15 MG/ML injection 15 mg (has no administration in time range)   oxyCODONE (Oxy IR/ROXICODONE) immediate release tablet 5 mg (has no administration in time range)    ED Course  I have reviewed the triage vital signs and the nursing notes.  Pertinent labs & imaging results that were available during my care of the patient were reviewed by me and considered in my medical decision making (see chart for details).    MDM Rules/Calculators/A&P                           40 yo M with a chief complaint of dental abscess.  He tried to drain this at home with some improvement.  On exam the patient does have an abscess to the left upper frontal incisor.  They are actively draining.  Will start on antibiotics.  Give him dentistry follow-up.  1:05 PM:  I have discussed the diagnosis/risks/treatment options with the patient and believe the pt to be eligible for discharge home to follow-up with PCP. We also discussed returning to the ED immediately if new or worsening sx occur. We discussed the sx which are most concerning (e.g., sudden worsening pain, fever, inability to tolerate by mouth) that necessitate immediate return. Medications administered to the patient during their visit and any new prescriptions provided to the patient are listed below.  Medications given during this visit Medications  lidocaine (PF) (XYLOCAINE) 1 % injection 0-20 mL (has no administration in time range)  clindamycin (CLEOCIN) capsule 450 mg (has no administration in time range)  ketorolac (TORADOL) 15 MG/ML injection 15 mg (has no administration in time range)  oxyCODONE (Oxy IR/ROXICODONE) immediate release tablet 5 mg (has no administration in time range)     The patient appears reasonably screen and/or stabilized for discharge and I doubt any other medical condition or other [facility] requiring further screening, evaluation, or treatment in the ED at this time prior to discharge.   Final Clinical Impression(s) / ED Diagnoses Final diagnoses:  Dental abscess    Rx / DC Orders  ED  Discharge Orders          Ordered    clindamycin (CLEOCIN) 150 MG capsule  Every 6 hours        07/12/21 [place], [place], DO 07/12/21 1305

## 2021-07-12 NOTE — Discharge Instructions (Addendum)
Take 4 over the counter ibuprofen tablets 3 times a day or 2 over-the-counter naproxen tablets twice a day for pain. Also take tylenol 1000mg (2 extra strength) four times a day.   Please return for fever or worsening discomfort.  Follow-up with a dentist in the office.

## 2021-07-12 NOTE — ED Notes (Signed)
Patient Alert and oriented to baseline. Stable and ambulatory to baseline. Patient verbalized understanding of the discharge instructions.  Patient belongings were taken by the patient.   

## 2021-07-12 NOTE — ED Provider Notes (Signed)
Emergency Medicine Provider Triage Evaluation Note  Brandon Mcneil , a 40 y.o. male  was evaluated in triage.  Pt complains of dental pain that started about 4 days ago.  Patient states that he was eating a piece of chicken when he felt one of his left incisors.  Since then he has noted the development of 2 abscesses one just above and 1 just behind the affected tooth.  Reports increased pain and difficulty eating.  Review of Systems  Positive: Dental pain, facial swelling Negative: Fevers chills, vision changes  Physical Exam  BP (!) 145/95 (BP Location: Left Arm)   Pulse 80   Temp 98.7 F (37.1 C) (Oral)   Resp 17   SpO2 99%  Gen:   Awake, no distress   Resp:  Normal effort  MSK:   Moves extremities without difficulty  Other:  Patient has fluctuant abscess superior from the left incisor.  There appears to be a more mild fluctuance just posterior to the affected incisor, without head or draining  Medical Decision Making  Medically screening exam initiated at 11:55 AM.  Appropriate orders placed.  Brandon Mcneil was informed that the remainder of the evaluation will be completed by another provider, this initial triage assessment does not replace that evaluation, and the importance of remaining in the ED until their evaluation is complete.     Janell Quiet, [place] 07/12/21 1203    Terald Sleeper, MD 07/12/21 [phone]

## 2021-07-21 ENCOUNTER — Other Ambulatory Visit (INDEPENDENT_AMBULATORY_CARE_PROVIDER_SITE_OTHER): Payer: Self-pay | Admitting: Primary Care

## 2021-07-21 ENCOUNTER — Ambulatory Visit (INDEPENDENT_AMBULATORY_CARE_PROVIDER_SITE_OTHER): Payer: Self-pay | Admitting: *Deleted

## 2021-07-21 DIAGNOSIS — E785 Hyperlipidemia, unspecified: Secondary | ICD-10-CM

## 2021-07-21 DIAGNOSIS — Z760 Encounter for issue of repeat prescription: Secondary | ICD-10-CM

## 2021-07-21 DIAGNOSIS — E1169 Type 2 diabetes mellitus with other specified complication: Secondary | ICD-10-CM

## 2021-07-21 IMAGING — CT CT ABD-PELV W/ CM
2 of 5 series · 16 of 46 positions shown, 18 images · IV contrast (omnipaque)
Comparison: None.

CLINICAL DATA: Hematuria with an unknown cause.

EXAM:
CT ABDOMEN AND PELVIS WITH CONTRAST
TECHNIQUE: Multidetector CT imaging of the abdomen and pelvis was performed
using the standard protocol following bolus administration of
intravenous contrast.
CONTRAST:  100mL OMNIPAQUE IOHEXOL 300 MG/ML  SOLN

[Series 2: axial st · axial · 0.82mm/px · z∈[-538,-98]mm · 13 of 102 slices shown, 15 images]
[im 7/102  soft-tissue]
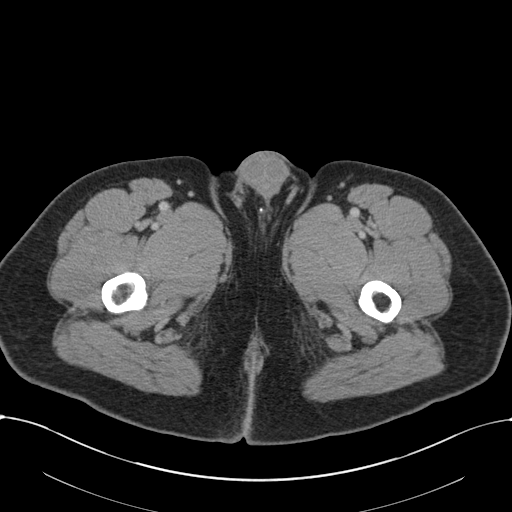
[im 7/102  bone]
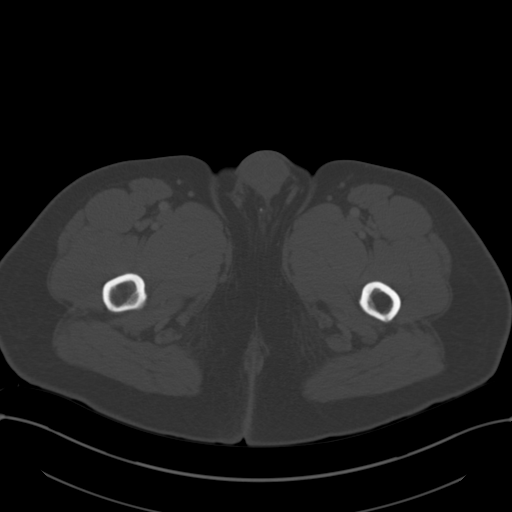
[im 14/102  soft-tissue]
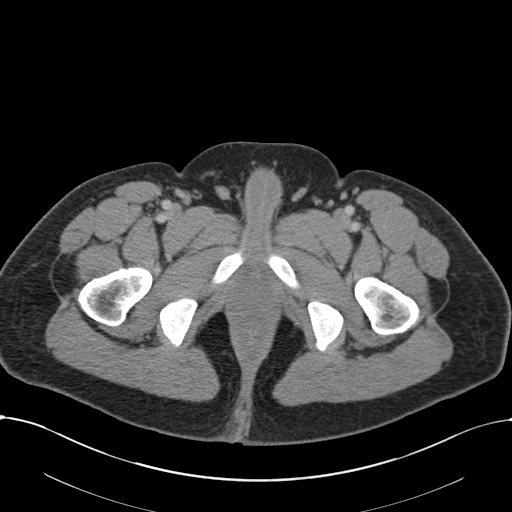
[im 21/102  soft-tissue]
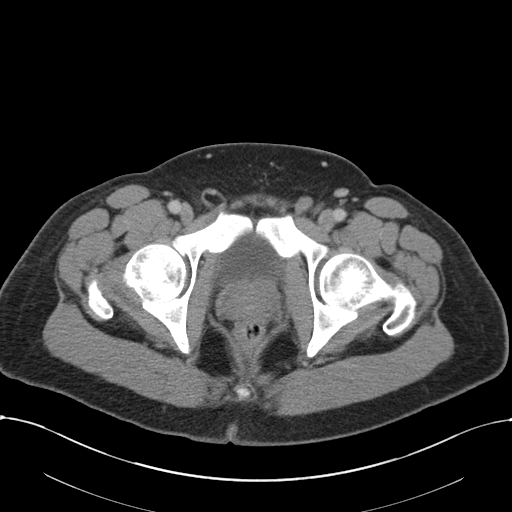
[im 27/102  soft-tissue]
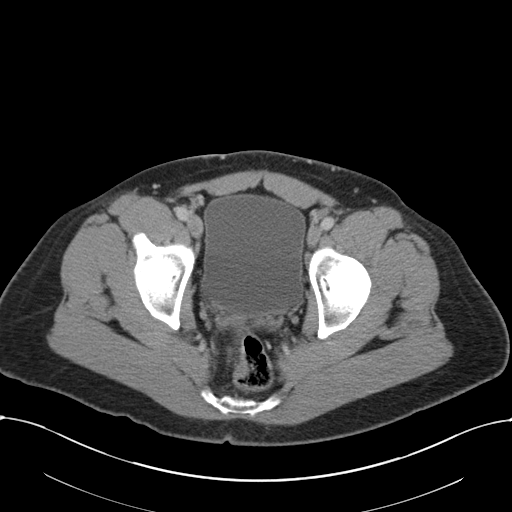
[im 34/102  soft-tissue]
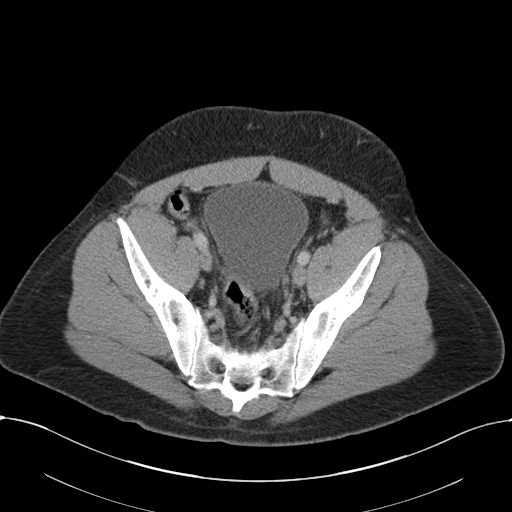
[im 41/102  soft-tissue]
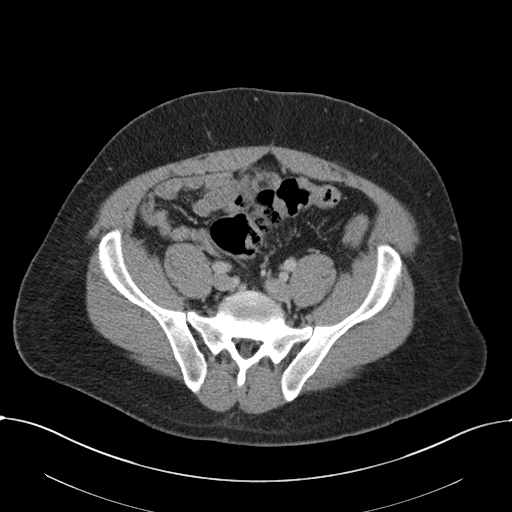
[im 54/102  soft-tissue]
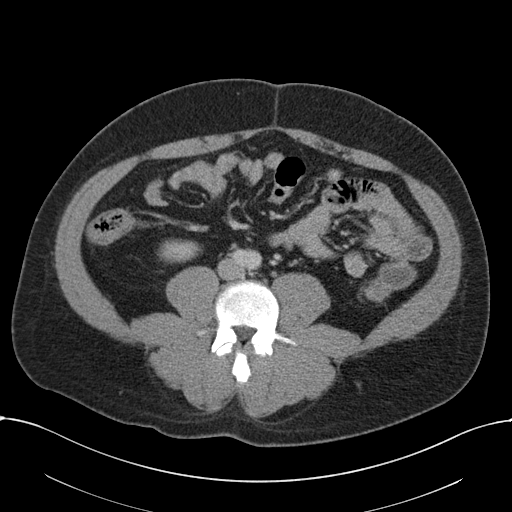
[im 61/102  soft-tissue]
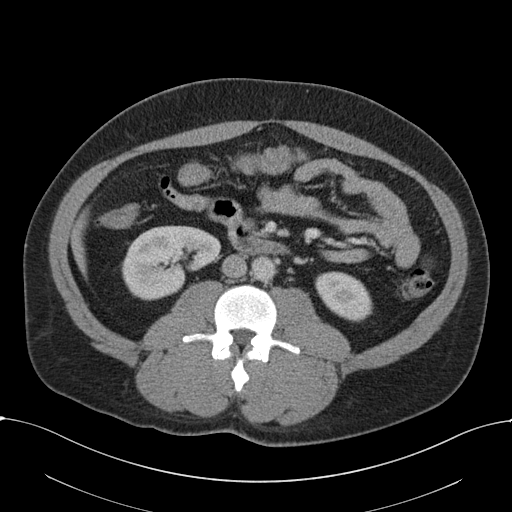
[im 68/102  soft-tissue]
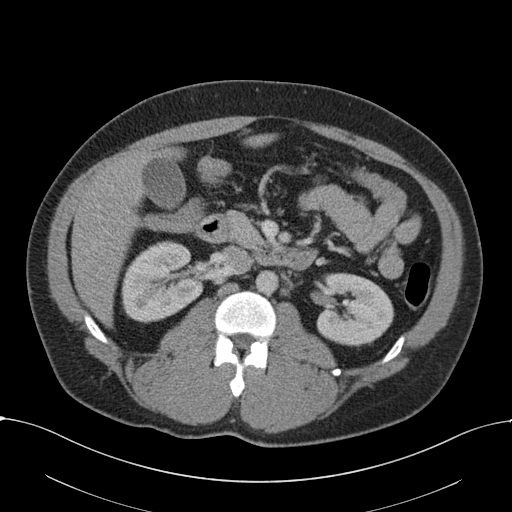
[im 68/102  bone]
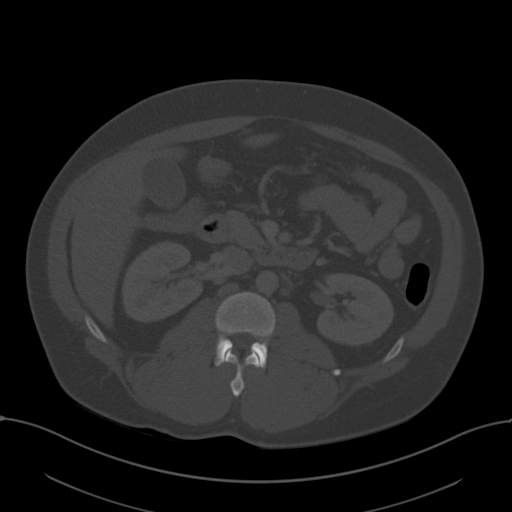
[im 75/102  soft-tissue]
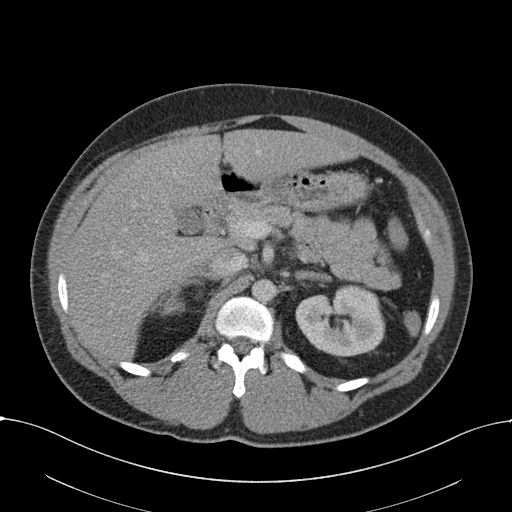
[im 81/102  soft-tissue]
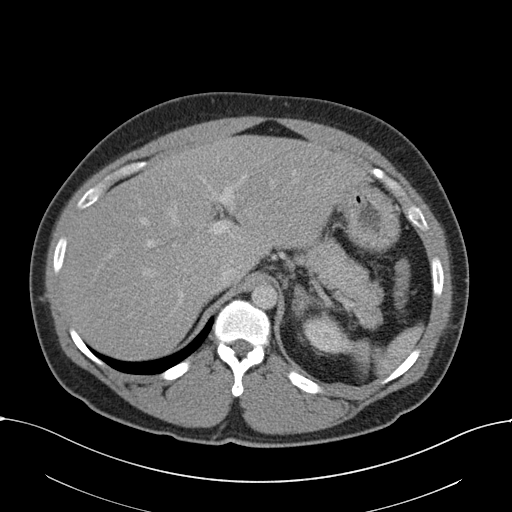
[im 88/102  soft-tissue]
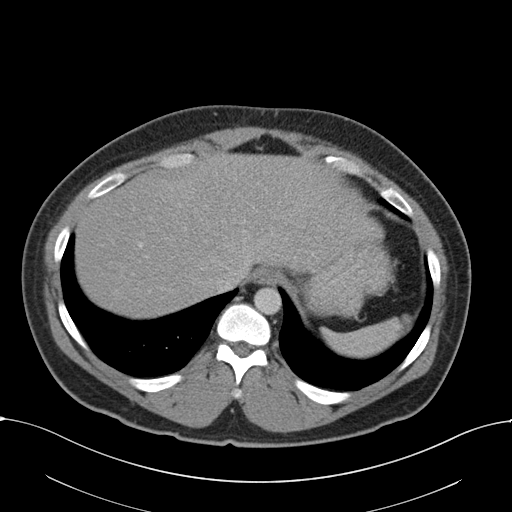
[im 95/102  soft-tissue]
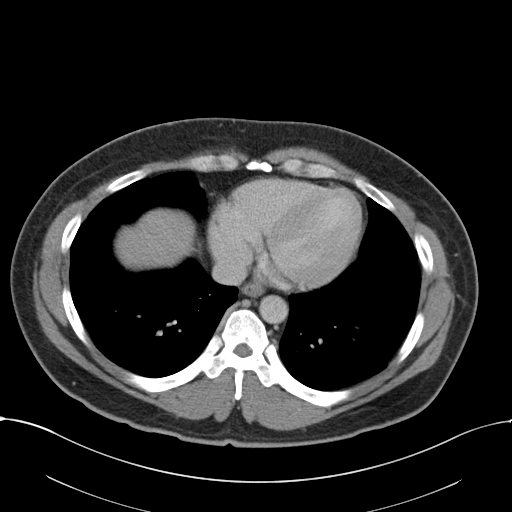

[Series 5: coronal st · coronal · 0.82mm/px · 3 of 153 slices shown]
[im 51/153  soft-tissue]
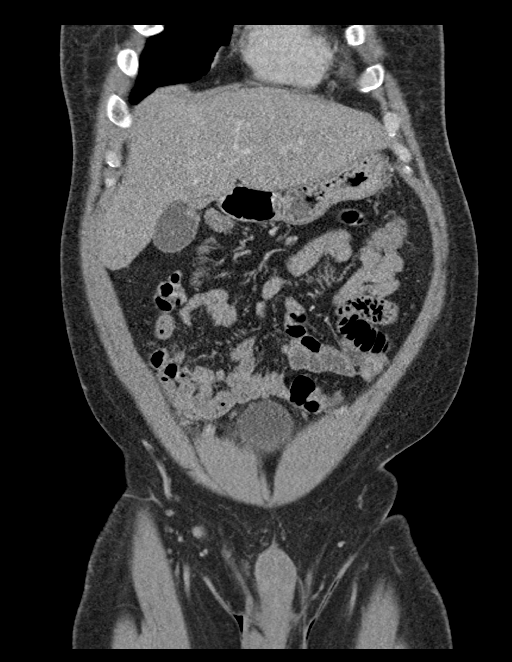
[im 68/153  soft-tissue]
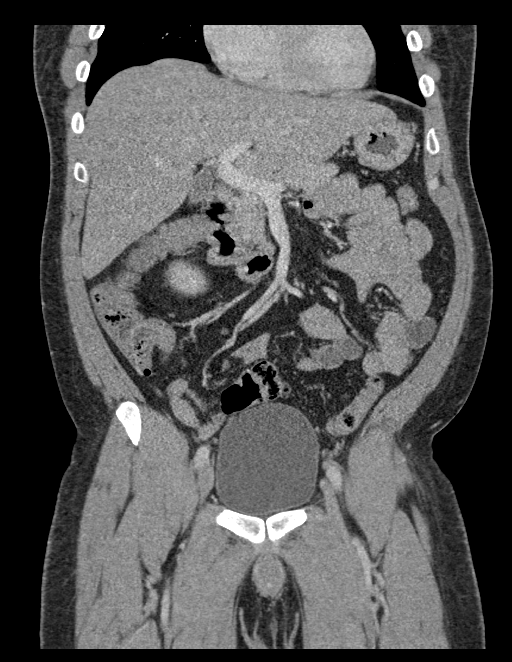
[im 85/153  soft-tissue]
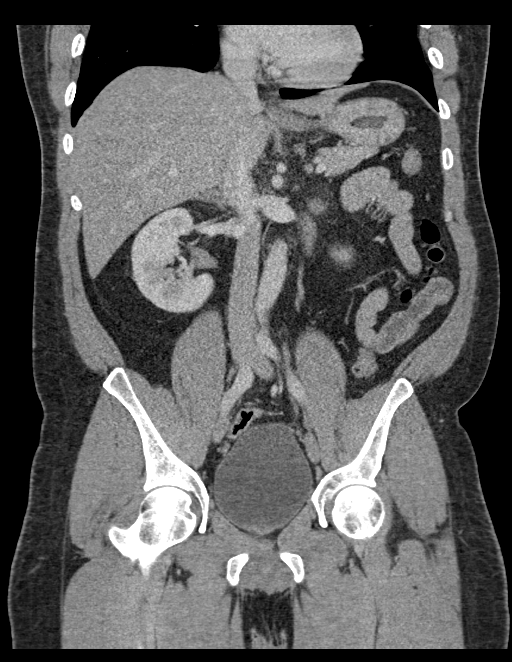

[16 of 46 positions shown; findings below may reference images not displayed]

FINDINGS: Lower chest: The lung bases are clear. The heart size is normal.

Hepatobiliary: There is decreased hepatic attenuation suggestive of
hepatic steatosis. There may be gallstones versus and adherent
cholesterol polyp within the gallbladder lumen.There is no biliary
ductal dilation.

Pancreas: Normal contours without ductal dilatation. No
peripancreatic fluid collection.

Spleen: No splenic laceration or hematoma.

Adrenals/Urinary Tract:

--Adrenal glands: No adrenal hemorrhage.

--Right kidney/ureter: No hydronephrosis or perinephric hematoma.

--Left kidney/ureter: No hydronephrosis or perinephric hematoma.

--Urinary bladder: Unremarkable.

Stomach/Bowel:

--Stomach/Duodenum: No hiatal hernia or other gastric abnormality.
Normal duodenal course and caliber.

--Small bowel: No dilatation or inflammation.

--Colon: No focal abnormality.

--Appendix: Normal.

Vascular/Lymphatic: Normal course and caliber of the major abdominal
vessels.

--No retroperitoneal lymphadenopathy.

--No mesenteric lymphadenopathy.

--No pelvic or inguinal lymphadenopathy.

Reproductive: Unremarkable

Other: There is a 1.8 cm fluid collection in the right gluteal cleft
which may represent a pilonidal cyst or abscess. There is some skin
thickening along the patient's low anterior left abdominal wall.

Musculoskeletal. No acute displaced fractures.
IMPRESSION: 1. No clear cause of the patient's hematuria. Specifically, there
are no radiopaque kidney stones. There is no hydronephrosis.
2. A 1.8 cm fluid collection in the right gluteal cleft which may
represent a pilonidal cyst or abscess. Correlation with physical
exam is recommended.
3. Hepatic steatosis.
4. Possible gallstones versus polyp within the gallbladder lumen.
Further evaluation with a nonemergent outpatient right upper
quadrant ultrasound is recommended.

## 2021-07-21 MED ORDER — INSULIN DETEMIR 100 UNIT/ML FLEXPEN
PEN_INJECTOR | SUBCUTANEOUS | 3 refills | Status: DC
  Filled 2021-07-21: qty 12, 30d supply, fill #0

## 2021-07-21 MED ORDER — METFORMIN HCL 1000 MG PO TABS
1000.0000 mg | ORAL_TABLET | Freq: Two times a day (BID) | ORAL | 1 refills | Status: DC
  Filled 2021-07-21: qty 60, 30d supply, fill #0

## 2021-07-21 NOTE — Telephone Encounter (Signed)
Please advise 

## 2021-07-21 NOTE — Telephone Encounter (Signed)
   Reason for Disposition  [1] Blood glucose > 300 mg/dL (40.9 mmol/L) AND [8] uses insulin (e.g., insulin-dependent, all people with type 1 diabetes)  Answer Assessment - Initial Assessment Questions 1. BLOOD GLUCOSE: "What is your blood glucose level?"      317 now 2. ONSET: "When did you check the blood glucose?"     now 3. USUAL RANGE: "What is your glucose level usually?" (e.g., usual fasting morning value, usual evening value)     180-320's 4. KETONES: "Do you check for ketones (urine or blood test strips)?" If yes, ask: "What does the test show now?"      no 5. TYPE 1 or 2:  "Do you know what type of diabetes you have?"  (e.g., Type 1, Type 2, Gestational; doesn't know)      2 6. INSULIN: "Do you take insulin?" "What type of insulin(s) do you use? What is the mode of delivery? (syringe, pen; injection or pump)?"      Yes, 2x day 7. DIABETES PILLS: "Do you take any pills for your diabetes?" If yes, ask: "Have you missed taking any pills recently?"     Yes 8. OTHER SYMPTOMS: "Do you have any symptoms?" (e.g., fever, frequent urination, difficulty breathing, dizziness, weakness, vomiting)     no 9. PREGNANCY: "Is there any chance you are pregnant?" "When was your last menstrual period?"     na  Protocols used: Diabetes - High Blood Sugar-A-AH

## 2021-07-21 NOTE — Telephone Encounter (Signed)
Calls to reschedule missed appointment. CBG now 317 has been btw 180's and 320's for 2 weeks.  Denies all symptoms other than increased urination-also drinking a lot more water daily since last OV.  Taking Levemir Flex pen insulin from his aunt-was told at last OV he could do this b/c as unable to afford prescribed insulin at this time. He has been out of metformin and glipizide for over one week.  Will pick up both those prescriptions in the morning. Reviewed symptoms of hyperglycemia/DKA for which if noticed he should be taken to the ED immediately. Patient understands and agrees to information.  Also encouraged patient to get appointment with [facility] Pharmacist to review medication/cost needs.  Agent scheduled appointment.

## 2021-07-22 ENCOUNTER — Other Ambulatory Visit: Payer: Self-pay

## 2021-07-22 NOTE — Telephone Encounter (Signed)
Patient aware of medication refill and increase. Advised to keep appointment on 12/27.

## 2021-07-29 ENCOUNTER — Other Ambulatory Visit: Payer: Self-pay

## 2021-08-12 ENCOUNTER — Ambulatory Visit (INDEPENDENT_AMBULATORY_CARE_PROVIDER_SITE_OTHER): Payer: Self-pay | Admitting: Primary Care

## 2021-08-12 ENCOUNTER — Other Ambulatory Visit: Payer: Self-pay

## 2021-08-12 ENCOUNTER — Encounter (INDEPENDENT_AMBULATORY_CARE_PROVIDER_SITE_OTHER): Payer: Self-pay | Admitting: Primary Care

## 2021-08-12 VITALS — BP 142/90 | HR 80 | Temp 98.0°F | Ht 68.0 in | Wt 218.6 lb

## 2021-08-12 DIAGNOSIS — F17210 Nicotine dependence, cigarettes, uncomplicated: Secondary | ICD-10-CM

## 2021-08-12 DIAGNOSIS — I10 Essential (primary) hypertension: Secondary | ICD-10-CM

## 2021-08-12 DIAGNOSIS — F17200 Nicotine dependence, unspecified, uncomplicated: Secondary | ICD-10-CM

## 2021-08-12 DIAGNOSIS — E1169 Type 2 diabetes mellitus with other specified complication: Secondary | ICD-10-CM

## 2021-08-12 DIAGNOSIS — E119 Type 2 diabetes mellitus without complications: Secondary | ICD-10-CM

## 2021-08-12 DIAGNOSIS — Z760 Encounter for issue of repeat prescription: Secondary | ICD-10-CM

## 2021-08-12 DIAGNOSIS — E785 Hyperlipidemia, unspecified: Secondary | ICD-10-CM

## 2021-08-12 DIAGNOSIS — E876 Hypokalemia: Secondary | ICD-10-CM

## 2021-08-12 LAB — POCT GLYCOSYLATED HEMOGLOBIN (HGB A1C): Hemoglobin A1C: 10.3 % — AB (ref 4.0–5.6)

## 2021-08-12 MED ORDER — GLIPIZIDE 10 MG PO TABS: 10.0000 mg | ORAL_TABLET | Freq: Two times a day (BID) | ORAL | 1 refills | Status: DC

## 2021-08-12 MED ORDER — LOSARTAN POTASSIUM-HCTZ 100-25 MG PO TABS: 1.0000 | ORAL_TABLET | Freq: Every day | ORAL | 1 refills | Status: DC

## 2021-08-12 MED ORDER — INSULIN ASPART 100 UNIT/ML CARTRIDGE (PENFILL): 12.0000 [IU] | Freq: Three times a day (TID) | SUBCUTANEOUS | 6 refills | Status: DC

## 2021-08-12 MED ORDER — METFORMIN HCL 1000 MG PO TABS
1000.0000 mg | ORAL_TABLET | Freq: Two times a day (BID) | ORAL | 1 refills | Status: DC
Start: 2021-08-12 — End: 2021-11-10

## 2021-08-12 MED ORDER — INSULIN DETEMIR 100 UNIT/ML FLEXPEN: PEN_INJECTOR | SUBCUTANEOUS | 3 refills | Status: DC

## 2021-08-12 NOTE — Progress Notes (Signed)
Subjective:  Patient ID: Brandon Mcneil, male    DOB: [birth year]  Age: 40 y.o. MRN: [id number]  CC: Diabetes and Hypertension   HPI Mr.SAVINO WHISENANT is 40 year old obese malepresents for follow-up of diabetes and HTN . Patient does  check blood sugar at home. Bp is elevated has not taken his Bp meds and had a cigarette just before appt.  Denies shortness of breath, headaches, chest pain or lower extremity edema   Compliant with meds - mostly Checking CBGs? Yes  Fasting avg - 220-340  Postprandial average -  Exercising regularly? - Yes Watching carbohydrate intake? - No Neuropathy ? - No Hypoglycemic events - No  - Recovers with :   Pertinent ROS:  Polyuria - Yes Polydipsia - No Vision problems - No  Medications as noted below. Taking them regularly without complication/adverse reaction being reported today.   History Bookert has a past medical history of Depression, Diabetes ([place]), Essential hypertension (02/03/2017), Hypercholesteremia, and Hypertension.   He has a past surgical history that includes Incision and drainage perirectal abscess (N/A, 08/02/2020).   His family history includes Healthy in his father and mother.He reports that he has been smoking cigarettes. He has a 7.00 pack-year smoking history. He has never used smokeless tobacco. He reports current alcohol use of about 2.0 standard drinks per week. He reports that he does not use drugs.  Current Outpatient Medications on File Prior to Visit  Medication Sig Dispense Refill   acetaminophen (TYLENOL) 500 MG tablet Take 1,000 mg by mouth every 6 (six) hours as needed for mild pain.     amLODipine (NORVASC) 10 MG tablet Take 1 tablet (10 mg total) by mouth daily. 90 tablet 1   aspirin 81 MG EC tablet Take 81 mg by mouth daily.     blood glucose meter kit and supplies KIT Dispense based on patient and insurance preference. Use up to four times daily as directed. (FOR ICD-9 250.00, 250.01). 1 each 0   Blood  Glucose Monitoring Suppl (TRUE METRIX METER) DEVI 1 kit by Does not apply route daily. Use as instructed to check blood sugar daily. 1 each 0   glucose blood (TRUE METRIX BLOOD GLUCOSE TEST) test strip Use as instructed to check blood sugar daily. 100 each 11   ibuprofen (ADVIL) 600 MG tablet Take 1 tablet (600 mg total) by mouth every 6 (six) hours as needed. 30 tablet 0   TRUEplus Lancets 28G MISC Use as instructed to check blood sugar daily. 100 each 11   No current facility-administered medications on file prior to visit.    ROS Comprehensive ROS pertinent positive and negative noted in HPI  Objective:  BP (!) 142/90 (BP Location: Right Arm, Patient Position: Sitting, Cuff Size: Large)    Pulse 80    Temp 98 F (36.7 C) (Oral)    Ht 5' 8" (1.727 m)    Wt 218 lb 9.6 oz (99.2 kg)    SpO2 98%    BMI 33.24 kg/m   BP Readings from Last 3 Encounters:  08/12/21 (!) 142/90  07/12/21 (!) 141/93  06/16/21 (!) 150/98    Wt Readings from Last 3 Encounters:  08/12/21 218 lb 9.6 oz (99.2 kg)  06/16/21 217 lb (98.4 kg)  03/27/21 218 lb 6.4 oz (99.1 kg)    Physical Exam  Lab Results  Component Value Date   HGBA1C 10.3 (A) 08/12/2021   HGBA1C 8.5 (A) 03/27/2021   HGBA1C 8.5 (A) 10/24/2020  Lab Results  Component Value Date   WBC 11.0 (H) 03/27/2021   HGB 13.0 03/27/2021   HCT 36.2 (L) 03/27/2021   PLT 212 03/27/2021   GLUCOSE 226 (H) 03/27/2021   CHOL 146 06/19/2020   TRIG 148 06/19/2020   HDL 60 06/19/2020   LDLCALC 61 06/19/2020   ALT 28 06/19/2020   AST 16 06/19/2020   NA 133 (L) 03/27/2021   K 3.4 (L) 03/27/2021   CL 101 03/27/2021   CREATININE 0.83 03/27/2021   BUN 11 03/27/2021   CO2 23 03/27/2021   INR 1.0 10/22/2019   HGBA1C 10.3 (A) 08/12/2021     Assessment & Plan:   Efton was seen today for diabetes and hypertension.  Diagnoses and all orders for this visit:  Type 2 diabetes mellitus with hyperlipidemia (HCC) -     HgB A1c 10.3 up in 4 months last  A1C 8.5- ( reviewed refills not taking insulin 3 of 3)    Education: Reviewed ABCs of diabetes management (respective goals in parentheses):  A1C (<7), blood pressure (<130/80), and cholesterol (LDL <100). Counseled Patient on the risk factors of co- morbidities with uncontrol diabetes  Complications -diabetic retinopathy, (close your eyes ? What do you see nothing) nephropathy decrease in kidney function- can lead to dialysis-on a machine 3 days a week to filter your kidney, neuropathy- numbness and tinging in your hands and feet,  increase risk of heart attack and stroke, and amputation due to decrease wound healing and circulation. Decrease your risk by taking medication daily as prescribed, monitor carbohydrates- foods that are high in carbohydrates are the following rice, potatoes, breads, sugars, and pastas.  Reduction in the intake (eating) will assist in lowering your blood sugars. Exercise daily at least 30 minutes daily.  Not following SSI- re- reviewed medication and how to take  3. CHO counting diet discussed.  Reviewed CHO amount in various foods and how to read nutrition labels.  Discussed recommended serving sizes. He admits to not monitoring CHO.  4.  Recommend check BG times a day  5. Recommended increase physical activity - goal is 150 minutes per week   Essential hypertension Counseled on blood pressure goal of less than 130/80, low-sodium, DASH diet, medication compliance, 150 minutes of moderate intensity exercise per week. Discussed medication compliance, adverse effects.  Discussed risk factors with uncontrol HTN- stroke , heart attack or both . Reviewed how to read labels for sodium content. Counseled on blood pressure goal of less than 130/80, low-sodium, DASH diet, medication compliance, 150 minutes of moderate intensity exercise per week. Discussed medication compliance, adverse effects.   Tobacco use disorder - I have recommended complete cessation of tobacco use. I  have discussed various options available for assistance with tobacco cessation including over the counter methods (Nicotine gum, patch and lozenges). We also discussed prescription options (Chantix, Nicotine Inhaler / Nasal Spray). The patient is not interested in pursuing any prescription tobacco cessation options at this time. - Patient declines at this time.   Comprehensive diabetic foot examination, type 2 DM, encounter for [facility]) Completed   I have discontinued Clerence E. Miron's atorvastatin and insulin aspart. I am also having him start on insulin aspart. Additionally, I am having him maintain his True Metrix Blood Glucose Test, True Metrix Meter, TRUEplus Lancets 28G, blood glucose meter kit and supplies, aspirin, acetaminophen, ibuprofen, amLODipine, insulin detemir, losartan-hydrochlorothiazide, metFORMIN, and glipiZIDE.  Meds ordered this encounter  Medications   insulin aspart (NOVOLOG) cartridge    Sig:  Inject 12 Units into the skin 3 (three) times daily with meals. 3 (three) times daily with meals. Sliding scale blood sugars 200-250 give 4 units of insulin, 251-300 give 6 units, 300-350 give 8 units, 351-400 give 10units, 351-400 ,> 400 give 12 units and call M.D. Discussed hypoglycemia protocol.    Dispense:  15 mL    Refill:  6   insulin detemir (LEVEMIR) 100 UNIT/ML FlexPen    Sig: Inject 20 units under the skin twice per day.    Dispense:  5 mL    Refill:  3   losartan-hydrochlorothiazide (HYZAAR) 100-25 MG tablet    Sig: Take 1 tablet by mouth daily.    Dispense:  90 tablet    Refill:  1   metFORMIN (GLUCOPHAGE) 1000 MG tablet    Sig: Take 1 tablet (1,000 mg total) by mouth 2 (two) times daily with a meal.    Dispense:  180 tablet    Refill:  1   glipiZIDE (GLUCOTROL) 10 MG tablet    Sig: Take 1 tablet (10 mg total) by mouth 2 (two) times daily before a meal.    Dispense:  180 tablet    Refill:  1     Follow-up:   Return in about 3 months (around 11/10/2021) for  Bp/DM.  The above assessment and management plan was discussed with the patient. The patient verbalized understanding of and has agreed to the management plan. Patient is aware to call the clinic if symptoms fail to improve or worsen. Patient is aware when to return to the clinic for a follow-up visit. Patient educated on when it is appropriate to go to the emergency department.   Juluis Mire, NP-C

## 2021-08-12 NOTE — Progress Notes (Signed)
Pt is fasting  He has taken metformin, bp meds and 6 units of insulin this am

## 2021-08-13 LAB — CMP14+EGFR
ALT: 12 IU/L (ref 0–44)
AST: 9 IU/L (ref 0–40)
Albumin/Globulin Ratio: 1.5 (ref 1.2–2.2)
Albumin: 4.3 g/dL (ref 4.0–5.0)
Alkaline Phosphatase: 89 IU/L (ref 44–121)
BUN/Creatinine Ratio: 16 (ref 9–20)
BUN: 14 mg/dL (ref 6–24)
Bilirubin Total: 0.5 mg/dL (ref 0.0–1.2)
CO2: 23 mmol/L (ref 20–29)
Calcium: 9 mg/dL (ref 8.7–10.2)
Chloride: 103 mmol/L (ref 96–106)
Creatinine, Ser: 0.88 mg/dL (ref 0.76–1.27)
Globulin, Total: 2.9 g/dL (ref 1.5–4.5)
Glucose: 202 mg/dL — ABNORMAL HIGH (ref 70–99)
Potassium: 4.4 mmol/L (ref 3.5–5.2)
Sodium: 137 mmol/L (ref 134–144)
Total Protein: 7.2 g/dL (ref 6.0–8.5)
eGFR: 110 mL/min/{1.73_m2} (ref >59)

## 2021-08-13 LAB — CBC WITH DIFFERENTIAL/PLATELET
Basophils Absolute: 0 10*3/uL (ref 0.0–0.2)
Basos: 1 %
EOS (ABSOLUTE): 0.2 10*3/uL (ref 0.0–0.4)
Eos: 2 %
Hematocrit: 43.2 % (ref 37.5–51.0)
Hemoglobin: 15.4 g/dL (ref 13.0–17.7)
Immature Grans (Abs): 0 10*3/uL (ref 0.0–0.1)
Immature Granulocytes: 0 %
Lymphocytes Absolute: 1.9 10*3/uL (ref 0.7–3.1)
Lymphs: 29 %
MCH: 30.9 pg (ref 26.6–33.0)
MCHC: 35.6 g/dL (ref 31.5–35.7)
MCV: 87 fL (ref 79–97)
Monocytes Absolute: 1 10*3/uL — ABNORMAL HIGH (ref 0.1–0.9)
Monocytes: 15 %
Neutrophils Absolute: 3.4 10*3/uL (ref 1.4–7.0)
Neutrophils: 53 %
Platelets: 240 10*3/uL (ref 150–450)
RBC: 4.99 x10E6/uL (ref 4.14–5.80)
RDW: 13.4 % (ref 11.6–15.4)
WBC: 6.4 10*3/uL (ref 3.4–10.8)

## 2021-08-13 LAB — LIPID PANEL
Chol/HDL Ratio: 2.4 ratio (ref 0.0–5.0)
Cholesterol, Total: 134 mg/dL (ref 100–199)
HDL: 56 mg/dL (ref >39)
LDL Chol Calc (NIH): 64 mg/dL (ref 0–99)
Triglycerides: 68 mg/dL (ref 0–149)
VLDL Cholesterol Cal: 14 mg/dL (ref 5–40)

## 2021-08-14 ENCOUNTER — Emergency Department (HOSPITAL_COMMUNITY)
Admission: EM | Admit: 2021-08-14 | Discharge: 2021-08-14 | Discharge status: Against Medical Advice | Payer: Self-pay | Attending: Emergency Medicine | Admitting: Emergency Medicine

## 2021-08-14 ENCOUNTER — Other Ambulatory Visit: Payer: Self-pay

## 2021-08-14 DIAGNOSIS — F10920 Alcohol use, unspecified with intoxication, uncomplicated: Secondary | ICD-10-CM

## 2021-08-14 DIAGNOSIS — I10 Essential (primary) hypertension: Secondary | ICD-10-CM | POA: Insufficient documentation

## 2021-08-14 DIAGNOSIS — R45851 Suicidal ideations: Secondary | ICD-10-CM | POA: Insufficient documentation

## 2021-08-14 DIAGNOSIS — Z20822 Contact with and (suspected) exposure to covid-19: Secondary | ICD-10-CM | POA: Insufficient documentation

## 2021-08-14 DIAGNOSIS — Z794 Long term (current) use of insulin: Secondary | ICD-10-CM | POA: Insufficient documentation

## 2021-08-14 DIAGNOSIS — Z7982 Long term (current) use of aspirin: Secondary | ICD-10-CM | POA: Insufficient documentation

## 2021-08-14 DIAGNOSIS — F17210 Nicotine dependence, cigarettes, uncomplicated: Secondary | ICD-10-CM | POA: Insufficient documentation

## 2021-08-14 DIAGNOSIS — Z79899 Other long term (current) drug therapy: Secondary | ICD-10-CM | POA: Insufficient documentation

## 2021-08-14 DIAGNOSIS — T461X1A Poisoning by calcium-channel blockers, accidental (unintentional), initial encounter: Secondary | ICD-10-CM | POA: Insufficient documentation

## 2021-08-14 DIAGNOSIS — E119 Type 2 diabetes mellitus without complications: Secondary | ICD-10-CM | POA: Insufficient documentation

## 2021-08-14 LAB — ETHANOL: Alcohol, Ethyl (B): 292 mg/dL — ABNORMAL HIGH (ref <10)

## 2021-08-14 LAB — CBC WITH DIFFERENTIAL/PLATELET
Abs Immature Granulocytes: 0.02 10*3/uL (ref 0.00–0.07)
Basophils Absolute: 0 10*3/uL (ref 0.0–0.1)
Basophils Relative: 0 %
Eosinophils Absolute: 0.2 10*3/uL (ref 0.0–0.5)
Eosinophils Relative: 2 %
HCT: 41.5 % (ref 39.0–52.0)
Hemoglobin: 15 g/dL (ref 13.0–17.0)
Immature Granulocytes: 0 %
Lymphocytes Relative: 38 %
Lymphs Abs: 3.3 10*3/uL (ref 0.7–4.0)
MCH: 30.2 pg (ref 26.0–34.0)
MCHC: 36.1 g/dL — ABNORMAL HIGH (ref 30.0–36.0)
MCV: 83.5 fL (ref 80.0–100.0)
Monocytes Absolute: 0.7 10*3/uL (ref 0.1–1.0)
Monocytes Relative: 8 %
Neutro Abs: 4.5 10*3/uL (ref 1.7–7.7)
Neutrophils Relative %: 52 %
Platelets: 260 10*3/uL (ref 150–400)
RBC: 4.97 MIL/uL (ref 4.22–5.81)
RDW: 13.3 % (ref 11.5–15.5)
WBC: 8.6 10*3/uL (ref 4.0–10.5)
nRBC: 0 % (ref 0.0–0.2)

## 2021-08-14 LAB — RAPID URINE DRUG SCREEN, HOSP PERFORMED
Amphetamines: NOT DETECTED
Barbiturates: NOT DETECTED
Benzodiazepines: NOT DETECTED
Cocaine: NOT DETECTED
Opiates: NOT DETECTED
Tetrahydrocannabinol: NOT DETECTED

## 2021-08-14 LAB — COMPREHENSIVE METABOLIC PANEL
ALT: 13 U/L (ref 0–44)
AST: 14 U/L — ABNORMAL LOW (ref 15–41)
Albumin: 3.7 g/dL (ref 3.5–5.0)
Alkaline Phosphatase: 92 U/L (ref 38–126)
Anion gap: 14 (ref 5–15)
BUN: 8 mg/dL (ref 6–20)
CO2: 18 mmol/L — ABNORMAL LOW (ref 22–32)
Calcium: 9.3 mg/dL (ref 8.9–10.3)
Chloride: 106 mmol/L (ref 98–111)
Creatinine, Ser: 0.79 mg/dL (ref 0.61–1.24)
GFR, Estimated: >60 mL/min (ref >60)
Glucose, Bld: 240 mg/dL — ABNORMAL HIGH (ref 70–99)
Potassium: 4 mmol/L (ref 3.5–5.1)
Sodium: 138 mmol/L (ref 135–145)
Total Bilirubin: 0.3 mg/dL (ref 0.3–1.2)
Total Protein: 7.2 g/dL (ref 6.5–8.1)

## 2021-08-14 LAB — RESP PANEL BY RT-PCR (FLU A&B, COVID) ARPGX2
Influenza A by PCR: NEGATIVE
Influenza B by PCR: NEGATIVE
SARS Coronavirus 2 by RT PCR: NEGATIVE

## 2021-08-14 LAB — SALICYLATE LEVEL: Salicylate Lvl: <7 mg/dL — ABNORMAL LOW (ref 7.0–30.0)

## 2021-08-14 LAB — ACETAMINOPHEN LEVEL: Acetaminophen (Tylenol), Serum: <10 ug/mL — ABNORMAL LOW (ref 10–30)

## 2021-08-14 LAB — CBG MONITORING, ED: Glucose-Capillary: 273 mg/dL — ABNORMAL HIGH (ref 70–99)

## 2021-08-14 NOTE — ED Notes (Signed)
Pt keeps pulling his blood pressure cuff off and pulse ox wanting to go home.
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
## 2021-08-14 NOTE — Discharge Instructions (Signed)
Please see the resource guide provided for resources for mental health and substance abuse.  Please make sure to return to the ER if you have any further thoughts of hurting yourself or others.  Return to the ER if you experience any dizziness, near passing out or passing out.  Thank you for allowing [place] to be a part of your care.

## 2021-08-14 NOTE — ED Notes (Signed)
Pt advises tech if the doctor doesn't seem him in 15 minutes he is walking out.

## 2021-08-16 ENCOUNTER — Emergency Department (HOSPITAL_COMMUNITY)
Admission: EM | Admit: 2021-08-16 | Discharge: 2021-08-17 | Disposition: A | Discharge status: Home / Self Care | Payer: Self-pay | Attending: Emergency Medicine | Admitting: Emergency Medicine

## 2021-08-16 DIAGNOSIS — E119 Type 2 diabetes mellitus without complications: Secondary | ICD-10-CM | POA: Insufficient documentation

## 2021-08-16 DIAGNOSIS — F10929 Alcohol use, unspecified with intoxication, unspecified: Secondary | ICD-10-CM

## 2021-08-16 DIAGNOSIS — R45851 Suicidal ideations: Secondary | ICD-10-CM | POA: Insufficient documentation

## 2021-08-16 DIAGNOSIS — Z7982 Long term (current) use of aspirin: Secondary | ICD-10-CM | POA: Insufficient documentation

## 2021-08-16 DIAGNOSIS — F17210 Nicotine dependence, cigarettes, uncomplicated: Secondary | ICD-10-CM | POA: Insufficient documentation

## 2021-08-16 DIAGNOSIS — I10 Essential (primary) hypertension: Secondary | ICD-10-CM | POA: Insufficient documentation

## 2021-08-16 DIAGNOSIS — Z7984 Long term (current) use of oral hypoglycemic drugs: Secondary | ICD-10-CM | POA: Insufficient documentation

## 2021-08-16 DIAGNOSIS — Z79899 Other long term (current) drug therapy: Secondary | ICD-10-CM | POA: Insufficient documentation

## 2021-08-16 DIAGNOSIS — Y908 Blood alcohol level of 240 mg/100 ml or more: Secondary | ICD-10-CM | POA: Insufficient documentation

## 2021-08-16 DIAGNOSIS — F1994 Other psychoactive substance use, unspecified with psychoactive substance-induced mood disorder: Secondary | ICD-10-CM

## 2021-08-16 DIAGNOSIS — Z794 Long term (current) use of insulin: Secondary | ICD-10-CM | POA: Insufficient documentation

## 2021-08-16 DIAGNOSIS — F1024 Alcohol dependence with alcohol-induced mood disorder: Secondary | ICD-10-CM | POA: Insufficient documentation

## 2021-08-16 DIAGNOSIS — R739 Hyperglycemia, unspecified: Secondary | ICD-10-CM

## 2021-08-16 DIAGNOSIS — L03317 Cellulitis of buttock: Secondary | ICD-10-CM

## 2021-08-16 MED ORDER — STERILE WATER FOR INJECTION IJ SOLN
INTRAMUSCULAR | Status: AC
  Filled 2021-08-16: qty 10

## 2021-08-16 MED ORDER — ZIPRASIDONE MESYLATE 20 MG IM SOLR
20.0000 mg | Freq: Once | INTRAMUSCULAR | Status: AC
  Filled 2021-08-16: qty 20

## 2021-08-16 MED ORDER — LACTATED RINGERS IV BOLUS
1000.0000 mL | Freq: Once | INTRAVENOUS | Status: AC
  Administered 2021-08-17: 1000 mL via INTRAVENOUS

## 2021-08-16 MED ORDER — LORAZEPAM 2 MG/ML IJ SOLN: 2.0000 mg | Freq: Once | INTRAMUSCULAR | Status: DC

## 2021-08-16 MED ORDER — ZIPRASIDONE MESYLATE 20 MG IM SOLR
INTRAMUSCULAR | Status: AC
  Administered 2021-08-16: 20 mg via INTRAMUSCULAR
  Filled 2021-08-16: qty 20

## 2021-08-16 NOTE — ED Provider Notes (Signed)
Emergency Medicine Provider Triage Evaluation Note  Brandon Mcneil , a 40 y.o. male  was evaluated in triage.  Pt complains of wanting to go home. Denies SI, HI. Admits to drinking tonight. Call to his mother who will not pick him up. Patient would like to take the bus home if his mother will not pick him up.   Review of Systems  Positive: Denies complaints  Negative:   Physical Exam  BP 117/82    Pulse 88    Temp 97.6 F (36.4 C) (Oral)    Resp 18    SpO2 100%  Gen:   Awake, no distress Resp:  Normal effort  MSK:   Moves extremities without difficulty  Other:  Ambulatory   Medical Decision Making  Medically screening exam initiated at 8:33 PM.  Appropriate orders placed.  PAYNE GARSKE was informed that the remainder of the evaluation will be completed by another provider, this initial triage assessment does not replace that evaluation, and the importance of remaining in the ED until their evaluation is complete.     Jeannie Fend, PA-C 08/16/21 2035    Linwood Dibbles, MD 08/17/21 1106

## 2021-08-16 NOTE — ED Provider Notes (Signed)
[facility] EMERGENCY DEPARTMENT Provider Note   CSN: [id number] Arrival date & time: 08/16/21  2010     History Chief Complaint  Patient presents with   Alcohol Intoxication    Brandon Mcneil is a 40 y.o. male.  HPI Patient is a 40 year old male with a medical history as noted below.  He presents to the emergency department due to alcohol intoxication.  Patient seen in the emergency department 2 days ago due to alcohol intoxication as well as possible SI.  He had generally reassuring lab work with no electrolyte abnormalities or evidence of DKA.  Glucose of 273 as well as an ethanol of 292, though patient appeared clinically sober at that time.  UDS was negative.  EKG without acute changes.  He was discharged home in stable condition.  He presents to the emergency department again this evening due to alcohol intoxication.  He tells me that he drank "2, 40 ounces of natural ice as well as a 12 ounce natural ice".  Denies any physical complaints.  Denies any HI, SI, visual/auditory hallucinations initially but on reassessment states that "a few days ago he took too much of his blood pressure medications in an attempt to kill himself.  He denies any regions of pain.  Denies any falls or injuries.    Past Medical History:  Diagnosis Date   Depression    Diabetes ([place])    Essential hypertension 02/03/2017   Hypercholesteremia    Hypertension     Patient Active Problem List   Diagnosis Date Noted   Abscess 08/02/2020   Stress 02/08/2017   Essential hypertension 02/03/2017   Type 2 diabetes mellitus with hyperlipidemia ([place]) 02/03/2017   Medication overdose 10/31/2013   Bipolar I disorder, most recent episode depressed ([place]) 09/11/2013   Tobacco use disorder 09/11/2013   Newly diagnosed diabetes ([place]) 09/11/2013   Dehydration 09/11/2013   Pain, dental 09/11/2013   DKA (diabetic ketoacidoses) 09/10/2013    Past Surgical History:  Procedure Laterality Date    INCISION AND DRAINAGE PERIRECTAL ABSCESS N/A 08/02/2020   Procedure: IRRIGATION AND DEBRIDEMENT PERIRECTAL ABSCESS;  Surgeon: Stark Klein, MD;  Location: [facility];  Service: General;  Laterality: N/A;       Family History  Problem Relation Age of Onset   Healthy Mother    Healthy Father     Social History   Tobacco Use   Smoking status: Every Day    Packs/day: 0.50    Years: 14.00    Pack years: 7.00    Types: Cigarettes   Smokeless tobacco: Never   Tobacco comments:    1/2 pack a day  Substance Use Topics   Alcohol use: Yes    Alcohol/week: 2.0 standard drinks    Types: 2 Cans of beer per week    Comment: occasional   Drug use: No    Types: Marijuana    Comment: [employer] former user.  Last use in September 2017    Home Medications Prior to Admission medications   Medication Sig Start Date End Date Taking? Authorizing Provider  acetaminophen (TYLENOL) 500 MG tablet Take 1,000 mg by mouth every 6 (six) hours as needed for mild pain.    [provider]  amLODipine (NORVASC) 10 MG tablet Take 1 tablet (10 mg total) by mouth daily. 06/16/21   Kerin Perna, NP  aspirin 81 MG EC tablet Take 81 mg by mouth daily.    [provider]  blood glucose meter kit and  supplies KIT Dispense based on patient and insurance preference. Use up to four times daily as directed. (FOR ICD-9 250.00, 250.01). 03/13/20   Vanessa Kick, MD  Blood Glucose Monitoring Suppl (TRUE METRIX METER) DEVI 1 kit by Does not apply route daily. Use as instructed to check blood sugar daily. 08/09/19   Charlott Rakes, MD  glipiZIDE (GLUCOTROL) 10 MG tablet Take 1 tablet (10 mg total) by mouth 2 (two) times daily before a meal. 08/12/21   Kerin Perna, NP  glucose blood (TRUE METRIX BLOOD GLUCOSE TEST) test strip Use as instructed to check blood sugar daily. 08/09/19   Charlott Rakes, MD  ibuprofen (ADVIL) 600 MG tablet Take 1 tablet (600 mg total) by mouth every 6 (six) hours as needed.  03/28/21   McDonald, Mia A, PA-C  insulin aspart (NOVOLOG) cartridge Inject 12 Units into the skin 3 (three) times daily with meals. 3 (three) times daily with meals. Sliding scale blood sugars 200-250 give 4 units of insulin, 251-300 give 6 units, 300-350 give 8 units, 351-400 give 10units, 351-400 ,> 400 give 12 units and call M.D. Discussed hypoglycemia protocol. 08/12/21   Kerin Perna, NP  insulin detemir (LEVEMIR) 100 UNIT/ML FlexPen Inject 20 units under the skin twice per day. 08/12/21   Kerin Perna, NP  losartan-hydrochlorothiazide (HYZAAR) 100-25 MG tablet Take 1 tablet by mouth daily. 08/12/21   Kerin Perna, NP  metFORMIN (GLUCOPHAGE) 1000 MG tablet Take 1 tablet (1,000 mg total) by mouth 2 (two) times daily with a meal. 08/12/21   Kerin Perna, NP  TRUEplus Lancets 28G MISC Use as instructed to check blood sugar daily. 08/09/19   Charlott Rakes, MD    Allergies    Lisinopril and Penicillins  Review of Systems   Review of Systems  All other systems reviewed and are negative. Ten systems reviewed and are negative for acute change, except as noted in the HPI.   Physical Exam Updated Vital Signs BP 134/89    Pulse 88    Temp (!) 97.5 F (36.4 C) (Oral)    Resp 18    SpO2 98%   Physical Exam Vitals and nursing note reviewed.  Constitutional:      General: He is not in acute distress.    Appearance: Normal appearance. He is not ill-appearing, toxic-appearing or diaphoretic.  HENT:     Head: Normocephalic and atraumatic.     Right Ear: External ear normal.     Left Ear: External ear normal.     Nose: Nose normal.     Mouth/Throat:     Mouth: Mucous membranes are moist.     Pharynx: Oropharynx is clear. No oropharyngeal exudate or posterior oropharyngeal erythema.  Eyes:     Extraocular Movements: Extraocular movements intact.  Cardiovascular:     Rate and Rhythm: Normal rate and regular rhythm.     Pulses: Normal pulses.     Heart sounds:  Normal heart sounds. No murmur heard.   No friction rub. No gallop.  Pulmonary:     Effort: Pulmonary effort is normal. No respiratory distress.     Breath sounds: Normal breath sounds. No stridor. No wheezing, rhonchi or rales.  Abdominal:     General: Abdomen is flat.     Palpations: Abdomen is soft.     Tenderness: There is no abdominal tenderness.  Musculoskeletal:        General: Normal range of motion.     Cervical back: Normal range of motion  and neck supple. No tenderness.  Skin:    General: Skin is warm and dry.  Neurological:     General: No focal deficit present.     Mental Status: He is alert and oriented to person, place, and time.     Comments: A&O x3.  Slight slurring of speech.  Otherwise, answering questions clearly and following commands.  Ambulatory with a steady gait.  Psychiatric:        Mood and Affect: Mood normal.        Behavior: Behavior normal.   ED Results / Procedures / Treatments   Labs (all labs ordered are listed, but only abnormal results are displayed) Labs Reviewed  COMPREHENSIVE METABOLIC PANEL  CBC WITH DIFFERENTIAL/PLATELET  ETHANOL  MAGNESIUM  SALICYLATE LEVEL  ACETAMINOPHEN LEVEL  RAPID URINE DRUG SCREEN, HOSP PERFORMED   EKG None  Radiology No results found.  Procedures Procedures   Medications Ordered in ED Medications  lactated ringers bolus 1,000 mL (has no administration in time range)  sterile water (preservative free) injection (has no administration in time range)  LORazepam (ATIVAN) injection 2 mg (has no administration in time range)  ziprasidone (GEODON) injection 20 mg (20 mg Intramuscular Given 08/16/21 2307)  sterile water (preservative free) injection (  Given 08/16/21 2307)   ED Course  I have reviewed the triage vital signs and the nursing notes.  Pertinent labs & imaging results that were available during my care of the patient were reviewed by me and considered in my medical decision making (see chart for  details).  Clinical Course as of 08/16/21 7048  Sat Aug 16, 2021  2227 Since arriving in the emergency department patient has been attempting to leave on multiple occasions.  He noted to security staff that he took blood pressure medications "a few days ago" in an attempt to kill himself.  He tells myself as well as my attending physician that "I cannot talk about this".  Will place patient under IVC.  Patient will require IM Geodon given his worsening combative behavior.  Will obtain lab work and start patient on IV fluids. [LJ]    Clinical Course User Index [LJ] Rayna Sexton, PA-C    MDM Rules/Calculators/A&P                          Pt is a 40 y.o. male who presents to the emergency department due to alcohol intoxication.  Since coming to the emergency department patient continues to request to be discharged.  He appears intoxicated but is ambulatory with a steady gait.  He attempted to leave on multiple occasions but noted to security staff while leaving that a few days ago he took too much of his blood pressure medication in an attempt to end his life.  Due to this patient was placed under IVC.  Given his increasingly combative behavior IM Geodon and Ativan were required.  Patient now lying comfortably in bed.  Will obtain basic lab work as well as start patient on IV fluids.    It is the end of my shift and patient care is being transferred to [facility].  Patient pending reassessment and lab work.  He will then require TTS consultation.  Final Clinical Impression(s) / ED Diagnoses Final diagnoses:  Alcoholic intoxication with complication [facility])  Suicidal ideation   Rx / DC Orders ED Discharge Orders     None        Rayna Sexton, PA-C 08/16/21 2340  Sherwood Gambler, MD 08/17/21 [phone]

## 2021-08-16 NOTE — ED Notes (Signed)
VS just obtained and stable. Pt reporting sleepiness after Geodon administration, lying down in bed and compliant at this time. Will attempt for blood and other tasks soon.

## 2021-08-16 NOTE — ED Notes (Signed)
Pt states "I'm going to leave." Explained to patient that I can give him some medication to help him sleep off drinking alcohol . Pt states "I'm not drunk, I need to go see my kids." Security bedside. Pt walking towards exit. Dr. Pricilla Loveless made aware that pt took off and did not take IM medication. Dr. Criss Alvine made aware that pt words are slurred and appears to be intoxicated. Pt able to walk in a straight line.

## 2021-08-16 NOTE — ED Triage Notes (Signed)
Pt BIB [facility] for alcohol intoxication. Per EMS, pt denies SI/HI. Pt attempting to leave, get up, and being verbally aggressive with triage staff.

## 2021-08-16 NOTE — ED Provider Notes (Signed)
11:23 PM Assumed care from [facility] and Dr. Criss Alvine, please see their note for full history, physical and decision making until this point. In brief this is a 40 y.o. year old male who presented to the ED tonight with Alcohol Intoxication     Patient was seen here by myself a few days ago and contracted for safety.  He was discharged to the care of his family.  Apparently tonight he came back intoxicated making vague suicidal threats again.  And trying to leave prior to being evaluated.  He has been involuntarily committed until he can sober up and appropriate psychiatric consult can be performed.  Patient is medically cleared fro TTS consultation from a lab/vital sign standpoint. Will need to metabolize some prior to TTS though.   Labs, studies and imaging reviewed by myself and considered in medical decision making if ordered. Imaging interpreted by radiology.  Labs Reviewed  COMPREHENSIVE METABOLIC PANEL  CBC WITH DIFFERENTIAL/PLATELET  ETHANOL  MAGNESIUM  SALICYLATE LEVEL  ACETAMINOPHEN LEVEL  RAPID URINE DRUG SCREEN, HOSP PERFORMED    No orders to display    No follow-ups on file.    Amiaya Mcneeley, Barbara Cower, MD 08/17/21 [phone]

## 2021-08-17 ENCOUNTER — Other Ambulatory Visit: Payer: Self-pay

## 2021-08-17 ENCOUNTER — Encounter (HOSPITAL_COMMUNITY): Payer: Self-pay | Admitting: Emergency Medicine

## 2021-08-17 LAB — CBC WITH DIFFERENTIAL/PLATELET
Abs Immature Granulocytes: 0.02 10*3/uL (ref 0.00–0.07)
Basophils Absolute: 0 10*3/uL (ref 0.0–0.1)
Basophils Relative: 0 %
Eosinophils Absolute: 0 10*3/uL (ref 0.0–0.5)
Eosinophils Relative: 1 %
HCT: 42.5 % (ref 39.0–52.0)
Hemoglobin: 15.7 g/dL (ref 13.0–17.0)
Immature Granulocytes: 0 %
Lymphocytes Relative: 27 %
Lymphs Abs: 2.3 10*3/uL (ref 0.7–4.0)
MCH: 30.5 pg (ref 26.0–34.0)
MCHC: 36.9 g/dL — ABNORMAL HIGH (ref 30.0–36.0)
MCV: 82.7 fL (ref 80.0–100.0)
Monocytes Absolute: 0.5 10*3/uL (ref 0.1–1.0)
Monocytes Relative: 5 %
Neutro Abs: 5.7 10*3/uL (ref 1.7–7.7)
Neutrophils Relative %: 67 %
Platelets: 244 10*3/uL (ref 150–400)
RBC: 5.14 MIL/uL (ref 4.22–5.81)
RDW: 13.2 % (ref 11.5–15.5)
WBC: 8.4 10*3/uL (ref 4.0–10.5)
nRBC: 0 % (ref 0.0–0.2)

## 2021-08-17 LAB — RAPID URINE DRUG SCREEN, HOSP PERFORMED
Amphetamines: NOT DETECTED
Barbiturates: NOT DETECTED
Benzodiazepines: NOT DETECTED
Cocaine: NOT DETECTED
Opiates: NOT DETECTED
Tetrahydrocannabinol: NOT DETECTED

## 2021-08-17 LAB — COMPREHENSIVE METABOLIC PANEL
ALT: 23 U/L (ref 0–44)
AST: 29 U/L (ref 15–41)
Albumin: 3.9 g/dL (ref 3.5–5.0)
Alkaline Phosphatase: 62 U/L (ref 38–126)
Anion gap: 12 (ref 5–15)
BUN: 11 mg/dL (ref 6–20)
CO2: 19 mmol/L — ABNORMAL LOW (ref 22–32)
Calcium: 8.6 mg/dL — ABNORMAL LOW (ref 8.9–10.3)
Chloride: 102 mmol/L (ref 98–111)
Creatinine, Ser: 0.97 mg/dL (ref 0.61–1.24)
GFR, Estimated: >60 mL/min (ref >60)
Glucose, Bld: 296 mg/dL — ABNORMAL HIGH (ref 70–99)
Potassium: 3.9 mmol/L (ref 3.5–5.1)
Sodium: 133 mmol/L — ABNORMAL LOW (ref 135–145)
Total Bilirubin: 0.4 mg/dL (ref 0.3–1.2)
Total Protein: 8 g/dL (ref 6.5–8.1)

## 2021-08-17 LAB — ACETAMINOPHEN LEVEL: Acetaminophen (Tylenol), Serum: <10 ug/mL — ABNORMAL LOW (ref 10–30)

## 2021-08-17 LAB — CBG MONITORING, ED: Glucose-Capillary: 214 mg/dL — ABNORMAL HIGH (ref 70–99)

## 2021-08-17 LAB — ETHANOL: Alcohol, Ethyl (B): 327 mg/dL (ref <10)

## 2021-08-17 LAB — SALICYLATE LEVEL: Salicylate Lvl: <7 mg/dL — ABNORMAL LOW (ref 7.0–30.0)

## 2021-08-17 LAB — MAGNESIUM: Magnesium: 2.3 mg/dL (ref 1.7–2.4)

## 2021-08-17 MED ORDER — CEPHALEXIN 500 MG PO CAPS: 500.0000 mg | ORAL_CAPSULE | Freq: Four times a day (QID) | ORAL | 0 refills | Status: AC

## 2021-08-17 MED ORDER — CEPHALEXIN 250 MG PO CAPS
500.0000 mg | ORAL_CAPSULE | Freq: Once | ORAL | Status: AC
Start: 2021-08-17 — End: 2021-08-17
  Administered 2021-08-17: 500 mg via ORAL
  Filled 2021-08-17: qty 2

## 2021-08-17 NOTE — ED Notes (Signed)
Pt woke up, is stating he wants to leave and that, "nobody can hold me here against my free will". This RN reached out to [facility] counselors to expedite TTS eval

## 2021-08-17 NOTE — ED Provider Notes (Signed)
Emergency Medicine Observation Re-evaluation Note  Brandon Mcneil is a 41 y.o. male, seen on rounds today.  Pt initially presented to the ED for complaints of  etoh intoxication and related mood disorder. After metabolizing substances, pt has been fully awake and alert in ED. He has been eating and drinking. Normal appetite. Denies any thoughts of harm to self or others.   Physical Exam  BP 138/75    Pulse 86    Temp (!) 97.5 F (36.4 C) (Oral)    Resp 18    SpO2 99%  Physical Exam General: alert, content, conversant, cooperative, pleasant.  Cardiac: regular rate Lungs: breathing comfortably Psych: normal mood and affect. Pt is smiling and conversant. Pt does not appear acutely depressed. No thoughts of harm to self or others. Pt is not responding to internal stimuli - no delusions or hallucinations are noted.   ED Course / MDM    I have reviewed the labs performed to date as well as medications administered while in observation.  Recent changes in the last 24 hours include ED observation, and reassessment.   Plan  Patient reports feeling much improved.   He has normal mood and affect. No thoughts of harm to self or others. No acute psychosis is noted.   Pt has eaten/drank, and been up to bathroom with steady gait.   Pt currently appears stable for d/c.      Lajean Saver, MD 08/17/21 [phone]

## 2021-08-17 NOTE — ED Provider Notes (Signed)
Care assumed from Dr. Dayna Barker.  Patient is currently awaiting TTS evaluation for reporting suicidal threats while intoxicated.  Patient was placed under IVC and TTS was consulted.  Patient was found to be medically cleared.  While awaiting TTS consult, patient reported he was having some discomfort where he is having some draining boils on his bottom.  I was able to evaluate patient with a chaperone and patient has 2 areas of tenderness on his right gluteal area that are indeed draining and have some tenderness.  There is no fluctuance but just some induration.  As they are draining, I had a shared decision made conversation with patient we agreed to hold on incision and drainage at this time and instead treat as a cellulitis and give him prescription for antibiotics.  We will give a dose now and print prescription for him.  Chart shows that he has tolerated Keflex many times in the past and will give a dose of Keflex now) prescription for  Patient is awaiting TTS recommendations and then anticipate discharge home.   Brandon Mcneil, Gwenyth Allegra, MD 08/17/21 [phone]

## 2021-08-17 NOTE — ED Notes (Signed)
3 copies IVC paperwork given to RN, 1 medical records, original in medical records
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
## 2021-08-17 NOTE — Discharge Instructions (Addendum)
It was our pleasure to provide your ER care today - we hope that you feel better.  Avoid alcohol use - follow up with AA, and use resource guide provided for additional treatment programs.   For skin/buttock issue, make sure to keep area very clean. Wash with warm soap and water 2x/day. Warm compresses to sore area. Take antibiotic as prescribed. Follow up with primary care doctor in the next few days for recheck.  For blood sugar, drink plenty of fluids/stay well hydrated, follow diabetes eating plan, take your diabetes meds, monitor and record glucose 4x/day, and follow up with primary care doctor.   For mental health issues and/or crisis, you may also go directly to the Behavioral Health Urgent Care - it is open 24/7, and walk-ins are welcome.   Return to ER if worse, new symptoms, fevers, new/severe pain, trouble breathing, or other concern.

## 2021-08-17 NOTE — ED Notes (Signed)
TTS in progress in room 36

## 2021-08-17 NOTE — BH Assessment (Signed)
Fax received with IVC paperwork. Given to Dr. Gasper Sells to review along with TTS note completed by night-time TTS staff (Sam).   Savalas Monje T. Jimmye Norman, MS, [facility], [facility] Triage Specialist [facility]

## 2021-08-17 NOTE — ED Notes (Signed)
Pt ate breakfast and walked to bathroom without difficulty, now back in bed and resting comfortably

## 2021-11-10 ENCOUNTER — Ambulatory Visit (INDEPENDENT_AMBULATORY_CARE_PROVIDER_SITE_OTHER): Payer: Self-pay | Admitting: Primary Care

## 2021-11-10 ENCOUNTER — Other Ambulatory Visit: Payer: Self-pay

## 2021-11-10 ENCOUNTER — Encounter (INDEPENDENT_AMBULATORY_CARE_PROVIDER_SITE_OTHER): Payer: Self-pay | Admitting: Primary Care

## 2021-11-10 VITALS — BP 146/88 | HR 87 | Temp 98.1°F | Ht 68.0 in | Wt 213.8 lb

## 2021-11-10 DIAGNOSIS — Z760 Encounter for issue of repeat prescription: Secondary | ICD-10-CM

## 2021-11-10 DIAGNOSIS — I10 Essential (primary) hypertension: Secondary | ICD-10-CM

## 2021-11-10 DIAGNOSIS — E785 Hyperlipidemia, unspecified: Secondary | ICD-10-CM

## 2021-11-10 DIAGNOSIS — F5102 Adjustment insomnia: Secondary | ICD-10-CM

## 2021-11-10 DIAGNOSIS — E1169 Type 2 diabetes mellitus with other specified complication: Secondary | ICD-10-CM

## 2021-11-10 LAB — POCT GLYCOSYLATED HEMOGLOBIN (HGB A1C): Hemoglobin A1C: 8.3 % — AB (ref 4.0–5.6)

## 2021-11-10 MED ORDER — METFORMIN HCL 1000 MG PO TABS: 1000.0000 mg | ORAL_TABLET | Freq: Two times a day (BID) | ORAL | 1 refills | Status: DC

## 2021-11-10 MED ORDER — LOSARTAN POTASSIUM-HCTZ 100-25 MG PO TABS: 1.0000 | ORAL_TABLET | Freq: Every day | ORAL | 1 refills | Status: DC

## 2021-11-10 MED ORDER — AMLODIPINE BESYLATE 10 MG PO TABS: 10.0000 mg | ORAL_TABLET | Freq: Every day | ORAL | 1 refills | Status: DC

## 2021-11-10 MED ORDER — INSULIN DETEMIR 100 UNIT/ML FLEXPEN: PEN_INJECTOR | SUBCUTANEOUS | 3 refills | Status: DC

## 2021-11-10 MED ORDER — GLIPIZIDE 10 MG PO TABS: 10.0000 mg | ORAL_TABLET | Freq: Two times a day (BID) | ORAL | 1 refills | Status: DC

## 2021-11-10 NOTE — Progress Notes (Signed)
? ?Subjective:  ?Patient ID: Brandon Mcneil, male    DOB: July 13, 1981  Age: 41 y.o. MRN: [id number] ? ?CC: Diabetes and Hypertension (/) ? ? ?HPI ?Janeece Fitting presents for follow-up of diabetes. Patient does not check blood sugar at home. Management of HTN- Denies shortness of breath, headaches, chest pain or lower extremity edema  ? ?Compliant with meds - Yes ?Checking CBGs? Yes ? Fasting avg - 124-250 ? Postprandial average -  ?Exercising regularly? - Yes ?Watching carbohydrate intake? - Yes ?Neuropathy ? - No ?Hypoglycemic events - No ? - Recovers with :  ? ?Pertinent ROS:  ?Polyuria - No ?Polydipsia - No ?Vision problems - No ? ?Medications as noted below. Taking them regularly without complication/adverse reaction being reported today.  ? ?History ?Brandon Mcneil has a past medical history of Depression, Diabetes ([place]), Essential hypertension (02/03/2017), Hypercholesteremia, and Hypertension.  ? ?He has a past surgical history that includes Incision and drainage perirectal abscess (N/A, 08/02/2020).  ? ?His family history includes Healthy in his father and mother.He reports that he has been smoking cigarettes. He has a 7.00 pack-year smoking history. He has never used smokeless tobacco. He reports current alcohol use of about 2.0 standard drinks per week. He reports that he does not use drugs. ? ?Current Outpatient Medications on File Prior to Visit  ?Medication Sig Dispense Refill  ? acetaminophen (TYLENOL) 500 MG tablet Take 1,000 mg by mouth every 6 (six) hours as needed for mild pain.    ? aspirin 81 MG EC tablet Take 81 mg by mouth daily.    ? blood glucose meter kit and supplies KIT Dispense based on patient and insurance preference. Use up to four times daily as directed. (FOR ICD-9 250.00, 250.01). 1 each 0  ? Blood Glucose Monitoring Suppl (TRUE METRIX METER) DEVI 1 kit by Does not apply route daily. Use as instructed to check blood sugar daily. 1 each 0  ? glucose blood (TRUE METRIX BLOOD GLUCOSE  TEST) test strip Use as instructed to check blood sugar daily. 100 each 11  ? insulin aspart (NOVOLOG) cartridge Inject 12 Units into the skin 3 (three) times daily with meals. 3 (three) times daily with meals. Sliding scale blood sugars 200-250 give 4 units of insulin, 251-300 give 6 units, 300-350 give 8 units, 351-400 give 10units, 351-400 ,> 400 give 12 units and call M.D. Discussed hypoglycemia protocol. 15 mL 6  ? TRUEplus Lancets 28G MISC Use as instructed to check blood sugar daily. 100 each 11  ? ?No current facility-administered medications on file prior to visit.  ? ? ?ROS ?Comprehensive ROS Pertinent positive and negative noted in HPI   ? ?Objective:  ?BP (!) 146/88 (BP Location: Right Arm, Patient Position: Sitting, Cuff Size: Normal)   Pulse 87   Temp 98.1 ?F (36.7 ?C) (Oral)   Ht _0  (1.727 m)   Wt 213 lb 12.8 oz (97 kg)   SpO2 93%   BMI 32.51 kg/m?  ? ?BP Readings from Last 3 Encounters:  ?11/10/21 (!) 146/88  ?08/17/21 130/82  ?08/14/21 (!) 144/90  ? ? ?Wt Readings from Last 3 Encounters:  ?11/10/21 213 lb 12.8 oz (97 kg)  ?08/12/21 218 lb 9.6 oz (99.2 kg)  ?06/16/21 217 lb (98.4 kg)  ? ? ?Physical Exam ?Vitals reviewed.  ?Constitutional:   ?   Appearance: He is obese.  ?HENT:  ?   Head: Normocephalic.  ?   Right Ear: Tympanic membrane and external ear normal.  ?  Left Ear: Tympanic membrane and external ear normal.  ?   Nose: Nose normal.  ?Eyes:  ?   Extraocular Movements: Extraocular movements intact.  ?   Pupils: Pupils are equal, round, and reactive to light.  ?Cardiovascular:  ?   Rate and Rhythm: Normal rate and regular rhythm.  ?Pulmonary:  ?   Effort: Pulmonary effort is normal.  ?   Breath sounds: Normal breath sounds.  ?Abdominal:  ?   General: Bowel sounds are normal. There is distension.  ?   Palpations: Abdomen is soft.  ?Musculoskeletal:     ?   General: Normal range of motion.  ?   Cervical back: Normal range of motion.  ?Skin: ?   General: Skin is warm and dry.   ?Neurological:  ?   Mental Status: He is alert and oriented to person, place, and time.  ?Psychiatric:     ?   Mood and Affect: Mood normal.     ?   Behavior: Behavior normal.     ?   Thought Content: Thought content normal.     ?   Judgment: Judgment normal.  ? ?Lab Results  ?Component Value Date  ? HGBA1C 8.3 (A) 11/10/2021  ? HGBA1C 10.3 (A) 08/12/2021  ? HGBA1C 8.5 (A) 03/27/2021  ? ? ?Lab Results  ?Component Value Date  ? WBC 8.4 08/16/2021  ? HGB 15.7 08/16/2021  ? HCT 42.5 08/16/2021  ? PLT 244 08/16/2021  ? GLUCOSE 296 (H) 08/16/2021  ? CHOL 134 08/12/2021  ? TRIG 68 08/12/2021  ? HDL 56 08/12/2021  ? [place] 64 08/12/2021  ? ALT 23 08/16/2021  ? AST 29 08/16/2021  ? NA 133 (L) 08/16/2021  ? K 3.9 08/16/2021  ? CL 102 08/16/2021  ? CREATININE 0.97 08/16/2021  ? BUN 11 08/16/2021  ? CO2 19 (L) 08/16/2021  ? INR 1.0 10/22/2019  ? HGBA1C 8.3 (A) 11/10/2021  ? ? ? ?Assessment & Plan:  ? ?Brandon Mcneil was seen today for diabetes and hypertension. ? ?Diagnoses and all orders for this visit: ? ?Type 2 diabetes mellitus with hyperlipidemia ([place]) ?-     HgB A1c 8.3 improved  ?-     insulin detemir (LEVEMIR) 100 UNIT/ML FlexPen; Inject 22 units under the skin twice per day. ? ?Essential hypertension, benign ?BP goal - < 130/80 ?Explained that having normal blood pressure is the goal and medications are helping to get to goal and maintain normal blood pressure. ?DIET: Limit salt intake, read nutrition labels to check salt content, limit fried and high fatty foods  ?Avoid using multisymptom OTC cold preparations that generally contain sudafed which can rise BP. Consult with pharmacist on best cold relief products to use for persons with HTN ?EXERCISE ?Discussed incorporating exercise such as walking - 30 minutes most days of the week and can do in 10 minute intervals    ?-     amLODipine (NORVASC) 10 MG tablet; Take 1 tablet (10 mg total) by mouth daily. ?-     losartan-hydrochlorothiazide (HYZAAR) 100-25 MG tablet; Take 1  tablet by mouth daily. ? ?Medication refill ?-     amLODipine (NORVASC) 10 MG tablet; Take 1 tablet (10 mg total) by mouth daily. ?-     glipiZIDE (GLUCOTROL) 10 MG tablet; Take 1 tablet (10 mg total) by mouth 2 (two) times daily before a meal. ?-     losartan-hydrochlorothiazide (HYZAAR) 100-25 MG tablet; Take 1 tablet by mouth daily. ?-     insulin detemir (  LEVEMIR) 100 UNIT/ML FlexPen; Inject 22 units under the skin twice per day. ?-     metFORMIN (GLUCOPHAGE) 1000 MG tablet; Take 1 tablet (1,000 mg total) by mouth 2 (two) times daily with a meal. ? ?Adjustment insomnia ?Started working from 7pm-7am- explained circadian rhythm body gets off balance from internal clock  ? ?I have discontinued Sayvon E. Parma's ibuprofen. I have also changed his insulin detemir. Additionally, I am having him maintain his True Metrix Blood Glucose Test, True Metrix Meter, TRUEplus Lancets 28G, blood glucose meter kit and supplies, aspirin, acetaminophen, insulin aspart, amLODipine, glipiZIDE, losartan-hydrochlorothiazide, and metFORMIN. ? ?Meds ordered this encounter  ?Medications  ? amLODipine (NORVASC) 10 MG tablet  ?  Sig: Take 1 tablet (10 mg total) by mouth daily.  ?  Dispense:  90 tablet  ?  Refill:  1  ? glipiZIDE (GLUCOTROL) 10 MG tablet  ?  Sig: Take 1 tablet (10 mg total) by mouth 2 (two) times daily before a meal.  ?  Dispense:  180 tablet  ?  Refill:  1  ? losartan-hydrochlorothiazide (HYZAAR) 100-25 MG tablet  ?  Sig: Take 1 tablet by mouth daily.  ?  Dispense:  90 tablet  ?  Refill:  1  ? insulin detemir (LEVEMIR) 100 UNIT/ML FlexPen  ?  Sig: Inject 22 units under the skin twice per day.  ?  Dispense:  5 mL  ?  Refill:  3  ? metFORMIN (GLUCOPHAGE) 1000 MG tablet  ?  Sig: Take 1 tablet (1,000 mg total) by mouth 2 (two) times daily with a meal.  ?  Dispense:  180 tablet  ?  Refill:  1  ? ? ? ?Follow-up:  ? ?No follow-ups on file. ? ?The above assessment and management plan was discussed with the patient. The patient  verbalized understanding of and has agreed to the management plan. Patient is aware to call the clinic if symptoms fail to improve or worsen. Patient is aware when to return to the clinic for a follow-up visit. Patient e

## 2021-11-10 NOTE — Patient Instructions (Signed)
Can try melatonin 5mg-15 mg at night for sleep, can also do benadryl 25-50mg at night for sleep.  If this does not help we can try prescription medication.  Also here is some information about good sleep hygiene.   Insomnia Insomnia is frequent trouble falling and/or staying asleep. Insomnia can be a long term problem or a short term problem. Both are common. Insomnia can be a short term problem when the wakefulness is related to a certain stress or worry. Long term insomnia is often related to ongoing stress during waking hours and/or poor sleeping habits. Overtime, sleep deprivation itself can make the problem worse. Every little thing feels more severe because you are overtired and your ability to cope is decreased. CAUSES  Stress, anxiety, and depression. Poor sleeping habits. Distractions such as TV in the bedroom. Naps close to bedtime. Engaging in emotionally charged conversations before bed. Technical reading before sleep. Alcohol and other sedatives. They may make the problem worse. They can hurt normal sleep patterns and normal dream activity. Stimulants such as caffeine for several hours prior to bedtime. Pain syndromes and shortness of breath can cause insomnia. Exercise late at night. Changing time zones may cause sleeping problems (jet lag). It is sometimes helpful to have someone observe your sleeping patterns. They should look for periods of not breathing during the night (sleep apnea). They should also look to see how long those periods last. If you live alone or observers are uncertain, you can also be observed at a sleep clinic where your sleep patterns will be professionally monitored. Sleep apnea requires a checkup and treatment. Give your caregivers your medical history. Give your caregivers observations your family has made about your sleep.  SYMPTOMS  Not feeling rested in the morning. Anxiety and restlessness at bedtime. Difficulty falling and staying asleep. TREATMENT   Your caregiver may prescribe treatment for an underlying medical disorders. Your caregiver can give advice or help if you are using alcohol or other drugs for self-medication. Treatment of underlying problems will usually eliminate insomnia problems. Medications can be prescribed for short time use. They are generally not recommended for lengthy use. Over-the-counter sleep medicines are not recommended for lengthy use. They can be habit forming. You can promote easier sleeping by making lifestyle changes such as: Using relaxation techniques that help with breathing and reduce muscle tension. Exercising earlier in the day. Changing your diet and the time of your last meal. No night time snacks. Establish a regular time to go to bed. Counseling can help with stressful problems and worry. Soothing music and white noise may be helpful if there are background noises you cannot remove. Stop tedious detailed work at least one hour before bedtime. HOME CARE INSTRUCTIONS  Keep a diary. Inform your caregiver about your progress. This includes any medication side effects. See your caregiver regularly. Take note of: Times when you are asleep. Times when you are awake during the night. The quality of your sleep. How you feel the next day. This information will help your caregiver care for you. Get out of bed if you are still awake after 15 minutes. Read or do some quiet activity. Keep the lights down. Wait until you feel sleepy and go back to bed. Keep regular sleeping and waking hours. Avoid naps. Exercise regularly. Avoid distractions at bedtime. Distractions include watching television or engaging in any intense or detailed activity like attempting to balance the household checkbook. Develop a bedtime ritual. Keep a familiar routine of bathing, brushing your teeth,   climbing into bed at the same time each night, listening to soothing music. Routines increase the success of falling to sleep faster. Use  relaxation techniques. This can be using breathing and muscle tension release routines. It can also include visualizing peaceful scenes. You can also help control troubling or intruding thoughts by keeping your mind occupied with boring or repetitive thoughts like the old concept of counting sheep. You can make it more creative like imagining planting one beautiful flower after another in your backyard garden. During your day, work to eliminate stress. When this is not possible use some of the previous suggestions to help reduce the anxiety that accompanies stressful situations. MAKE SURE YOU:  Understand these instructions. Will watch your condition. Will get help right away if you are not doing well or get worse. Document Released: 07/31/2000 Document Revised: 10/26/2011 Document Reviewed: 08/31/2007 Exit[employer] Patient Information 2015 [employer]. This information is not intended to replace advice given to you by your health care provider. Make sure you discuss any questions you have with your health care provider.  

## 2021-12-26 ENCOUNTER — Encounter (HOSPITAL_COMMUNITY): Payer: Self-pay

## 2021-12-26 ENCOUNTER — Ambulatory Visit (HOSPITAL_COMMUNITY)
Admission: EM | Admit: 2021-12-26 | Discharge: 2021-12-26 | Disposition: A | Discharge status: Home / Self Care | Payer: 59 | Attending: Family Medicine | Admitting: Family Medicine

## 2021-12-26 DIAGNOSIS — R112 Nausea with vomiting, unspecified: Secondary | ICD-10-CM

## 2021-12-26 DIAGNOSIS — R1012 Left upper quadrant pain: Secondary | ICD-10-CM | POA: Diagnosis not present

## 2021-12-26 MED ORDER — ONDANSETRON 4 MG PO TBDP: 4.0000 mg | ORAL_TABLET | Freq: Three times a day (TID) | ORAL | 0 refills | Status: DC | PRN

## 2021-12-26 MED ORDER — HYDROXYZINE HCL 25 MG PO TABS: 50.0000 mg | ORAL_TABLET | Freq: Three times a day (TID) | ORAL | 0 refills | Status: AC | PRN

## 2021-12-26 MED ORDER — ACETAMINOPHEN 325 MG PO TABS: 650.0000 mg | ORAL_TABLET | Freq: Two times a day (BID) | ORAL | 0 refills | Status: AC

## 2021-12-26 MED ORDER — PANTOPRAZOLE SODIUM 40 MG PO TBEC: 40.0000 mg | DELAYED_RELEASE_TABLET | Freq: Every day | ORAL | 1 refills | Status: DC

## 2021-12-26 NOTE — Discharge Instructions (Signed)
Your symptoms are related to possible liver problems that require further workup and evaluation in the emergency department. I would like for you to go to the ER as soon as you are able to for further urgent evaluation.  ? ?Please take protonix once daily for your stomach symptoms to protect your stomach lining. Take this 30 minutes before you eat. ? ?Take hydroxyzine for any tremor (shakes) you experience related to alcohol withdrawal while you wait to go to the emergency room for further workup. ? ?Take zofran for any nausea you may experience over the next few days.  ? ?Try to eat 5 small meals daily to help stabilize your blood sugars. You may take tylenol for pain, but only twice daily. Do not take ibuprofen.  ? ?Go to the ER on Sunday as discussed for further workup. It is VERY IMPORTANT that you go. Follow-up with your primary care provider for further evaluation and management of your symptoms as well as ongoing wellness visits.  I hope you feel better! ?

## 2021-12-26 NOTE — ED Provider Notes (Addendum)
?[facility] ? ? ? ?CSN: [id number] ?Arrival date & time: 12/26/21  1006 ? ? ?  ? ?History   ?Chief Complaint ?Chief Complaint  ?Patient presents with  ? Dehydration  ? Hemoptysis  ? ? ?HPI ?Brandon Mcneil is a 41 y.o. male.  ? ?Patient presents to urgent care for evaluation of hematemesis 3 days ago. He states he has been drinking alcohol everyday since December 09, 2021. He reports drinking 3 40oz beers everyday since December 09, 2021 and 1/2 of a 5th of liquor. He was hospitalized this past new year's eve for alcoholism and states he cut back his drinking significantly after this. His drinking has suddenly increased since December 09, 2021. Reports generalized abdominal pain, nausea, and decreased appetite. He is a diabetic and his blood sugars have been "so low" he hasn't been taking his insulin. He "threw away" his metformin last month but he has still been taking glipizide. He snacks on peanut butter crackers or a small [place] sandwich during the day and eats a big meal in the night time around 12 or 1am. He works as a Animal nutritionist at [employer] and states his blood sugar starts out at [place] at around [place] and 120s at around [place]. Reports getting only 1-2 hours of sleep per day for the last 2-3 months. He has taken melatonin with minimal relief to help him fall asleep.  ? ?He last ate some meatloaf and mac n cheese around 12am last night. He denies feeling shaky, blurred vision, headache, and weakness at this time. Denies fevers. He states he "thinks he has had some hallucinations" but he just "don't know what it be". He woke up at about 5am this morning and it "looked like someone was sitting in the chair, but no one was, it was just his clothes".  ? ?Reports feeling of sadness and depression. He lives with his mother due to poor finances at this time who is worried about how much he drinks and he gets "cussed out 24/7".  He has attempted 30 day alcohol cessation programs in the past with one successful  attempt last year, but resumed drinking immediately after. He has a history of bipolar disorder and was on latuda and prozac in the past, but states that these medications "caused him to  gain weight and develop diabetes". When he stops drinking, he gets "the shakes" 3 days after alcohol cessation, 2 days later, he becomes "out of it" and spends the whole day in the bed. To make himself feel better, he begins drinking again. Denies suicidal ideation and homicidal ideation at this time.  ? ? ? ? ?Past Medical History:  ?Diagnosis Date  ? Depression   ? Diabetes ([place])   ? Essential hypertension 02/03/2017  ? Hypercholesteremia   ? Hypertension   ? ? ?Patient Active Problem List  ? Diagnosis Date Noted  ? Abscess 08/02/2020  ? Stress 02/08/2017  ? Essential hypertension 02/03/2017  ? Type 2 diabetes mellitus with hyperlipidemia ([place]) 02/03/2017  ? Medication overdose 10/31/2013  ? Bipolar I disorder, most recent episode depressed ([place]) 09/11/2013  ? Tobacco use disorder 09/11/2013  ? Newly diagnosed diabetes ([place]) 09/11/2013  ? Dehydration 09/11/2013  ? Pain, dental 09/11/2013  ? DKA (diabetic ketoacidoses) 09/10/2013  ? ? ?Past Surgical History:  ?Procedure Laterality Date  ? INCISION AND DRAINAGE PERIRECTAL ABSCESS N/A 08/02/2020  ? Procedure: IRRIGATION AND DEBRIDEMENT PERIRECTAL ABSCESS;  Surgeon: Stark Klein, MD;  Location: [facility];  Service: General;  Laterality: N/A;  ? ? ? ? ? ?Home Medications   ? ?Prior to Admission medications   ?Medication Sig Start Date End Date Taking? Authorizing Provider  ?acetaminophen (TYLENOL) 325 MG tablet Take 2 tablets (650 mg total) by mouth in the morning and at bedtime. 12/26/21  Yes Talbot Grumbling, FNP  ?hydrOXYzine (ATARAX) 25 MG tablet Take 2 tablets (50 mg total) by mouth every 8 (eight) hours as needed for up to 7 days. 12/26/21 01/02/22 Yes Talbot Grumbling, FNP  ?ondansetron (ZOFRAN-ODT) 4 MG disintegrating tablet Take 1 tablet (4 mg total) by mouth every 8  (eight) hours as needed for nausea or vomiting. 12/26/21  Yes Talbot Grumbling, FNP  ?pantoprazole (PROTONIX) 40 MG tablet Take 1 tablet (40 mg total) by mouth daily. 12/26/21  Yes Talbot Grumbling, FNP  ?amLODipine (NORVASC) 10 MG tablet Take 1 tablet (10 mg total) by mouth daily. 11/10/21   Kerin Perna, NP  ?aspirin 81 MG EC tablet Take 81 mg by mouth daily.    [provider]  ?blood glucose meter kit and supplies KIT Dispense based on patient and insurance preference. Use up to four times daily as directed. (FOR ICD-9 250.00, 250.01). 03/13/20   Vanessa Kick, MD  ?Blood Glucose Monitoring Suppl (TRUE METRIX METER) DEVI 1 kit by Does not apply route daily. Use as instructed to check blood sugar daily. 08/09/19   Charlott Rakes, MD  ?glipiZIDE (GLUCOTROL) 10 MG tablet Take 1 tablet (10 mg total) by mouth 2 (two) times daily before a meal. 11/10/21   Kerin Perna, NP  ?glucose blood (TRUE METRIX BLOOD GLUCOSE TEST) test strip Use as instructed to check blood sugar daily. 08/09/19   Charlott Rakes, MD  ?insulin aspart (NOVOLOG) cartridge Inject 12 Units into the skin 3 (three) times daily with meals. 3 (three) times daily with meals. Sliding scale blood sugars 200-250 give 4 units of insulin, 251-300 give 6 units, 300-350 give 8 units, 351-400 give 10units, 351-400 ,> 400 give 12 units and call M.D. Discussed hypoglycemia protocol. 08/12/21   Kerin Perna, NP  ?insulin detemir (LEVEMIR) 100 UNIT/ML FlexPen Inject 22 units under the skin twice per day. 11/10/21   Kerin Perna, NP  ?losartan-hydrochlorothiazide (HYZAAR) 100-25 MG tablet Take 1 tablet by mouth daily. 11/10/21   Kerin Perna, NP  ?metFORMIN (GLUCOPHAGE) 1000 MG tablet Take 1 tablet (1,000 mg total) by mouth 2 (two) times daily with a meal. 11/10/21   Kerin Perna, NP  ?TRUEplus Lancets 28G MISC Use as instructed to check blood sugar daily. 08/09/19   Charlott Rakes, MD  ? ? ?Family  History ?Family History  ?Problem Relation Age of Onset  ? Healthy Mother   ? Healthy Father   ? ? ?Social History ?Social History  ? ?Tobacco Use  ? Smoking status: Every Day  ?  Packs/day: 0.50  ?  Years: 14.00  ?  Pack years: 7.00  ?  Types: Cigarettes  ? Smokeless tobacco: Never  ? Tobacco comments:  ?  1/2 pack a day  ?Substance Use Topics  ? Alcohol use: Yes  ?  Alcohol/week: 2.0 standard drinks  ?  Types: 2 Cans of beer per week  ?  Comment: occasional  ? Drug use: No  ?  Types: Marijuana  ?  Comment: [employer] former user.  Last use in September 2017  ? ? ? ?Allergies   ?Lisinopril and Penicillins ? ? ?Review of Systems ?Review of Systems ?  Per HPI ? ?Physical Exam ?Triage Vital Signs ?ED Triage Vitals [12/26/21 1055]  ?Enc Vitals Group  ?   BP (!) 162/99  ?   Pulse Rate 71  ?   Resp 18  ?   Temp 98.2 ?F (36.8 ?C)  ?   Temp Source Oral  ?   SpO2 97 %  ?   Weight   ?   Height   ?   Head Circumference   ?   Peak Flow   ?   Pain Score   ?   Pain Loc   ?   Pain Edu?   ?   Excl. in [place]?   ? ?No data found. ? ?Updated Vital Signs ?BP (!) 162/99 (BP Location: Left Arm)   Pulse 71   Temp 98.2 ?F (36.8 ?C) (Oral)   Resp 18   SpO2 97%  ? ?Visual Acuity ?Right Eye Distance:   ?Left Eye Distance:   ?Bilateral Distance:   ? ?Right Eye Near:   ?Left Eye Near:    ?Bilateral Near:    ? ?Physical Exam ?Vitals and nursing note reviewed.  ?Constitutional:   ?   General: He is not in acute distress. ?   Appearance: Normal appearance. He is well-developed. He is not ill-appearing.  ?   Comments: Very pleasant patient sitting comfortably on exam able in no acute distress.   ?HENT:  ?   Head: Normocephalic and atraumatic.  ?   Right Ear: Tympanic membrane, ear canal and external ear normal.  ?   Left Ear: Tympanic membrane, ear canal and external ear normal.  ?   Nose: Nose normal.  ?   Mouth/Throat:  ?   Mouth: Mucous membranes are moist.  ?   Pharynx: No posterior oropharyngeal erythema.  ?Eyes:  ?   General: Lids are normal. Vision  grossly intact. Gaze aligned appropriately. Scleral icterus present. No allergic shiner or visual field deficit.    ?   Right eye: No discharge.     ?   Left eye: No discharge.  ?   Extraocular Movements: Extraocular movemen

## 2021-12-26 NOTE — ED Triage Notes (Signed)
Pt reports drinking (3) 40 ounces alcoholic drinks since 12/09/2021. He feels like he is dehydrate and has notice a bloody productive cough. He is a current everyday smoker.  ?

## 2021-12-29 ENCOUNTER — Encounter (HOSPITAL_COMMUNITY): Payer: Self-pay

## 2021-12-29 ENCOUNTER — Emergency Department (HOSPITAL_COMMUNITY)
Admission: EM | Admit: 2021-12-29 | Discharge: 2021-12-29 | Disposition: A | Discharge status: Home / Self Care | Payer: 59 | Attending: Emergency Medicine | Admitting: Emergency Medicine

## 2021-12-29 ENCOUNTER — Other Ambulatory Visit: Payer: Self-pay

## 2021-12-29 DIAGNOSIS — Z794 Long term (current) use of insulin: Secondary | ICD-10-CM | POA: Insufficient documentation

## 2021-12-29 DIAGNOSIS — Z7984 Long term (current) use of oral hypoglycemic drugs: Secondary | ICD-10-CM | POA: Diagnosis not present

## 2021-12-29 DIAGNOSIS — Z7982 Long term (current) use of aspirin: Secondary | ICD-10-CM | POA: Insufficient documentation

## 2021-12-29 DIAGNOSIS — K2921 Alcoholic gastritis with bleeding: Secondary | ICD-10-CM | POA: Insufficient documentation

## 2021-12-29 DIAGNOSIS — R1010 Upper abdominal pain, unspecified: Secondary | ICD-10-CM | POA: Diagnosis present

## 2021-12-29 LAB — LIPASE, BLOOD: Lipase: 36 U/L (ref 11–51)

## 2021-12-29 LAB — COMPREHENSIVE METABOLIC PANEL
ALT: 22 U/L (ref 0–44)
AST: 23 U/L (ref 15–41)
Albumin: 3.4 g/dL — ABNORMAL LOW (ref 3.5–5.0)
Alkaline Phosphatase: 54 U/L (ref 38–126)
Anion gap: 7 (ref 5–15)
BUN: 10 mg/dL (ref 6–20)
CO2: 25 mmol/L (ref 22–32)
Calcium: 8.5 mg/dL — ABNORMAL LOW (ref 8.9–10.3)
Chloride: 103 mmol/L (ref 98–111)
Creatinine, Ser: 1 mg/dL (ref 0.61–1.24)
GFR, Estimated: >60 mL/min (ref >60)
Glucose, Bld: 311 mg/dL — ABNORMAL HIGH (ref 70–99)
Potassium: 3.3 mmol/L — ABNORMAL LOW (ref 3.5–5.1)
Sodium: 135 mmol/L (ref 135–145)
Total Bilirubin: 1.3 mg/dL — ABNORMAL HIGH (ref 0.3–1.2)
Total Protein: 7.3 g/dL (ref 6.5–8.1)

## 2021-12-29 LAB — CBC
HCT: 40.7 % (ref 39.0–52.0)
Hemoglobin: 14.9 g/dL (ref 13.0–17.0)
MCH: 30.7 pg (ref 26.0–34.0)
MCHC: 36.6 g/dL — ABNORMAL HIGH (ref 30.0–36.0)
MCV: 83.7 fL (ref 80.0–100.0)
Platelets: 205 10*3/uL (ref 150–400)
RBC: 4.86 MIL/uL (ref 4.22–5.81)
RDW: 12.7 % (ref 11.5–15.5)
WBC: 4.3 10*3/uL (ref 4.0–10.5)
nRBC: 0 % (ref 0.0–0.2)

## 2021-12-29 LAB — TYPE AND SCREEN
ABO/RH(D): A POS
Antibody Screen: NEGATIVE

## 2021-12-29 LAB — ETHANOL: Alcohol, Ethyl (B): <10 mg/dL (ref <10)

## 2021-12-29 MED ORDER — SUCRALFATE 1 G PO TABS: 1.0000 g | ORAL_TABLET | Freq: Three times a day (TID) | ORAL | 0 refills | Status: DC

## 2021-12-29 MED ORDER — FAMOTIDINE 20 MG PO TABS: 20.0000 mg | ORAL_TABLET | Freq: Two times a day (BID) | ORAL | 0 refills | Status: DC

## 2021-12-29 NOTE — ED Triage Notes (Signed)
Patient reports that he began having abdominal pain and vomiting blood. Patient states he was seen at an UC  3 days ago. ?Patient states 2 days ago he drank some beers with his neighbor and began vomiting a large amount of dark red blood. None today. ? ? ?

## 2021-12-29 NOTE — ED Provider Notes (Signed)
?[facility] DEPT ?Provider Note ? ? ?CSN: [id number] ?Arrival date & time: 12/29/21  1012 ? ?  ? ?History ? ?Chief Complaint  ?Patient presents with  ? Hematemesis  ? Abdominal Pain  ? ? ?Brandon Mcneil is a 41 y.o. male. ? ?HPI ?Patient presents with concern of abdominal pain, nausea, vomiting, hematemesis.  Symptoms were most pronounced 2 days ago, have improved somewhat.  Prior to the onset of symptoms patient notes that he was drinking, daily, including several beers consumed at that point.  He had abdominal pain, diffuse, upper, with vomiting, hematemesis but none over the past 48 hours.  He went to urgent care, was encouraged to come to the emergency department, but has not done so until today.  He notes he typically drinks daily, but has not done so since acute pain. ?  ? ?Home Medications ?Prior to Admission medications   ?Medication Sig Start Date End Date Taking? Authorizing Provider  ?acetaminophen (TYLENOL) 325 MG tablet Take 2 tablets (650 mg total) by mouth in the morning and at bedtime. 12/26/21   Talbot Grumbling, FNP  ?amLODipine (NORVASC) 10 MG tablet Take 1 tablet (10 mg total) by mouth daily. 11/10/21   Kerin Perna, NP  ?aspirin 81 MG EC tablet Take 81 mg by mouth daily.    [provider]  ?blood glucose meter kit and supplies KIT Dispense based on patient and insurance preference. Use up to four times daily as directed. (FOR ICD-9 250.00, 250.01). 03/13/20   Vanessa Kick, MD  ?Blood Glucose Monitoring Suppl (TRUE METRIX METER) DEVI 1 kit by Does not apply route daily. Use as instructed to check blood sugar daily. 08/09/19   Charlott Rakes, MD  ?glipiZIDE (GLUCOTROL) 10 MG tablet Take 1 tablet (10 mg total) by mouth 2 (two) times daily before a meal. 11/10/21   Kerin Perna, NP  ?glucose blood (TRUE METRIX BLOOD GLUCOSE TEST) test strip Use as instructed to check blood sugar daily. 08/09/19   Charlott Rakes, MD  ?hydrOXYzine (ATARAX)  25 MG tablet Take 2 tablets (50 mg total) by mouth every 8 (eight) hours as needed for up to 7 days. 12/26/21 01/02/22  Talbot Grumbling, FNP  ?insulin aspart (NOVOLOG) cartridge Inject 12 Units into the skin 3 (three) times daily with meals. 3 (three) times daily with meals. Sliding scale blood sugars 200-250 give 4 units of insulin, 251-300 give 6 units, 300-350 give 8 units, 351-400 give 10units, 351-400 ,> 400 give 12 units and call M.D. Discussed hypoglycemia protocol. 08/12/21   Kerin Perna, NP  ?insulin detemir (LEVEMIR) 100 UNIT/ML FlexPen Inject 22 units under the skin twice per day. 11/10/21   Kerin Perna, NP  ?losartan-hydrochlorothiazide (HYZAAR) 100-25 MG tablet Take 1 tablet by mouth daily. 11/10/21   Kerin Perna, NP  ?metFORMIN (GLUCOPHAGE) 1000 MG tablet Take 1 tablet (1,000 mg total) by mouth 2 (two) times daily with a meal. 11/10/21   Kerin Perna, NP  ?ondansetron (ZOFRAN-ODT) 4 MG disintegrating tablet Take 1 tablet (4 mg total) by mouth every 8 (eight) hours as needed for nausea or vomiting. 12/26/21   Talbot Grumbling, FNP  ?pantoprazole (PROTONIX) 40 MG tablet Take 1 tablet (40 mg total) by mouth daily. 12/26/21   Talbot Grumbling, FNP  ?TRUEplus Lancets 28G MISC Use as instructed to check blood sugar daily. 08/09/19   Charlott Rakes, MD  ?   ? ?Allergies    ?Lisinopril and Penicillins   ? ?  Review of Systems   ?Review of Systems  ?Constitutional:   ?     Per HPI, otherwise negative  ?HENT:    ?     Per HPI, otherwise negative  ?Respiratory:    ?     Per HPI, otherwise negative  ?Cardiovascular:   ?     Per HPI, otherwise negative  ?Gastrointestinal:  Positive for abdominal pain, nausea and vomiting.  ?Endocrine:  ?     Negative aside from HPI  ?Genitourinary:   ?     Neg aside from HPI   ?Musculoskeletal:   ?     Per HPI, otherwise negative  ?Skin: Negative.   ?Neurological:  Negative for syncope.  ? ?Physical Exam ?Updated Vital Signs ?BP (!) 165/94 (BP  Location: Right Arm)   Pulse 72   Temp 98.5 ?F (36.9 ?C) (Oral)   Resp 18   Ht '5\' 8"'  (1.727 m)   Wt 97.5 kg   SpO2 97%   BMI 32.69 kg/m?  ?Physical Exam ?Vitals and nursing note reviewed.  ?Constitutional:   ?   General: He is not in acute distress. ?   Appearance: He is well-developed.  ?HENT:  ?   Head: Normocephalic and atraumatic.  ?Eyes:  ?   Conjunctiva/sclera: Conjunctivae normal.  ?Cardiovascular:  ?   Rate and Rhythm: Normal rate and regular rhythm.  ?Pulmonary:  ?   Effort: Pulmonary effort is normal. No respiratory distress.  ?   Breath sounds: No stridor.  ?Abdominal:  ?   General: There is no distension.  ?   Tenderness: There is no abdominal tenderness. There is no guarding or rebound.  ?Skin: ?   General: Skin is warm and dry.  ?Neurological:  ?   Mental Status: He is alert and oriented to person, place, and time.  ? ? ?ED Results / Procedures / Treatments   ?Labs ?(all labs ordered are listed, but only abnormal results are displayed) ?Labs Reviewed  ?COMPREHENSIVE METABOLIC PANEL  ?CBC  ?ETHANOL  ?LIPASE, BLOOD  ?POC OCCULT BLOOD, ED  ?TYPE AND SCREEN  ? ? ?EKG ?None ? ?Radiology ?No results found. ? ?Procedures ?Procedures  ? ? ?Medications Ordered in ED ?Medications - No data to display ? ?ED Course/ Medical Decision Making/ A&P ?This patient with a Hx of bipolar disorder, alcohol use disorder presents to the ED for concern of abdominal pain, nausea, vomiting, hematemesis, this involves an extensive number of treatment options, and is a complaint that carries with it a high risk of complications and morbidity.   ? ?The differential diagnosis includes acute alcoholic gastritis, pancreatitis, hepatobiliary dysfunction ? ? ?Social Determinants of Health: ? ?As above psychiatric disease, tobacco use disorder, alcohol use disorder ? ?Additional history obtained: ? ?Additional history and/or information obtained from chart review, notable for urgent care note 2 days ago suggesting need for  emergent ED evaluation ? ? ?After the initial evaluation, orders, including: Labs were initiated. ? ? ?Patient placed on Cardiac and Pulse-Oximetry Monitors. ?The patient was maintained on a cardiac monitor.  The cardiac monitored showed an rhythm of 75 sinus normal ?The patient was also maintained on pulse oximetry. The readings were typically 100% room air normal ? ? ?On repeat evaluation of the patient stayed the same ? ?Lab Tests: ? ?I personally interpreted labs.  The pertinent results include: Normal lipase, negative alcohol, minimal hypokalemia, there is hyperglycemia, but no gap ? ? ?Dispostion / Final MDM: ? ?After consideration of the diagnostic results and  the patient's response to treatment, patient in no distress, awake, alert.  No evidence for substantial blood loss, though he did have some hematemesis this is likely secondary to alcoholic gastritis.  He is a soft, nonperitoneal abdomen, no indication for advanced imaging currently.  Patient amenable to initiation of medications for gastritis, discharged with outpatient follow-up. ?Given the hematemesis, history of alcohol use, hospitalization was a consideration but the reassuring labs, absence of acute findings are determined for outpatient follow-up. ? ?Final Clinical Impression(s) / ED Diagnoses ?Final diagnoses:  ?Acute alcoholic gastritis with hemorrhage  ? ? ?Rx / DC Orders ?ED Discharge Orders   ? ?      Ordered  ?  famotidine (PEPCID) 20 MG tablet  2 times daily       ? 12/29/21 1304  ?  sucralfate (CARAFATE) 1 g tablet  3 times daily with meals & bedtime       ?Note to Pharmacy: Take for one week  ? 12/29/21 1304  ? ?  ?  ? ?  ? ? ?  ?Carmin Muskrat, MD ?12/29/21 1305 ? ?

## 2021-12-29 NOTE — Discharge Instructions (Signed)
As discussed, your evaluation today has been largely reassuring.  But, it is important that you monitor your condition carefully, and do not hesitate to return to the ED if you develop new, or concerning changes in your condition. ? ?Otherwise, please follow-up with your physician for appropriate ongoing care. ? ?

## 2022-02-11 ENCOUNTER — Ambulatory Visit (INDEPENDENT_AMBULATORY_CARE_PROVIDER_SITE_OTHER): Payer: Self-pay | Admitting: Primary Care

## 2022-03-02 ENCOUNTER — Ambulatory Visit (INDEPENDENT_AMBULATORY_CARE_PROVIDER_SITE_OTHER): Payer: Self-pay | Admitting: Primary Care

## 2022-03-03 ENCOUNTER — Ambulatory Visit (INDEPENDENT_AMBULATORY_CARE_PROVIDER_SITE_OTHER): Payer: 59 | Admitting: Primary Care

## 2022-03-03 ENCOUNTER — Encounter (INDEPENDENT_AMBULATORY_CARE_PROVIDER_SITE_OTHER): Payer: Self-pay | Admitting: Primary Care

## 2022-03-03 VITALS — BP 139/89 | HR 78 | Temp 98.4°F | Wt 219.0 lb

## 2022-03-03 DIAGNOSIS — E785 Hyperlipidemia, unspecified: Secondary | ICD-10-CM

## 2022-03-03 DIAGNOSIS — I10 Essential (primary) hypertension: Secondary | ICD-10-CM

## 2022-03-03 DIAGNOSIS — F3130 Bipolar disorder, current episode depressed, mild or moderate severity, unspecified: Secondary | ICD-10-CM | POA: Diagnosis not present

## 2022-03-03 DIAGNOSIS — F5102 Adjustment insomnia: Secondary | ICD-10-CM

## 2022-03-03 DIAGNOSIS — E1169 Type 2 diabetes mellitus with other specified complication: Secondary | ICD-10-CM | POA: Diagnosis not present

## 2022-03-03 DIAGNOSIS — Z760 Encounter for issue of repeat prescription: Secondary | ICD-10-CM

## 2022-03-03 MED ORDER — METFORMIN HCL 1000 MG PO TABS: 1000.0000 mg | ORAL_TABLET | Freq: Two times a day (BID) | ORAL | 1 refills | Status: DC

## 2022-03-03 MED ORDER — GLIPIZIDE 10 MG PO TABS: 10.0000 mg | ORAL_TABLET | Freq: Two times a day (BID) | ORAL | 1 refills | Status: DC

## 2022-03-03 MED ORDER — INSULIN DETEMIR 100 UNIT/ML FLEXPEN: PEN_INJECTOR | SUBCUTANEOUS | 3 refills | Status: DC

## 2022-03-03 MED ORDER — INSULIN ASPART 100 UNIT/ML CARTRIDGE (PENFILL): 12.0000 [IU] | Freq: Three times a day (TID) | SUBCUTANEOUS | 6 refills | Status: DC

## 2022-03-03 MED ORDER — LOSARTAN POTASSIUM-HCTZ 100-25 MG PO TABS: 1.0000 | ORAL_TABLET | Freq: Every day | ORAL | 1 refills | Status: DC

## 2022-03-03 MED ORDER — AMLODIPINE BESYLATE 10 MG PO TABS: 10.0000 mg | ORAL_TABLET | Freq: Every day | ORAL | 1 refills | Status: DC

## 2022-03-03 MED ORDER — HYDROXYZINE PAMOATE 25 MG PO CAPS: 25.0000 mg | ORAL_CAPSULE | Freq: Three times a day (TID) | ORAL | 1 refills | Status: DC | PRN

## 2022-03-08 NOTE — Progress Notes (Signed)
Brandon Mcneil, is a 41 y.o. male  ZOX:[id number]  [id number]  DOB - 1980-10-12  Chief Complaint  Patient presents with   Diabetes       Subjective:   Brandon Mcneil is a 41 y.o. male here today for a follow up visit diabetes. Denies polyuria, polydipsia, and polyphagia .Patient has No headache, No chest pain, No abdominal pain - No Nausea, No new weakness tingling or numbness, No Cough - shortness of breath  No problems updated.  Allergies  Allergen Reactions   Lisinopril Swelling and Other (See Comments)    Lips and mouth became swollen   Penicillins Itching, Swelling and Rash    Past Medical History:  Diagnosis Date   Depression    Diabetes ([place])    Essential hypertension 02/03/2017   Hypercholesteremia    Hypertension     Current Outpatient Medications on File Prior to Visit  Medication Sig Dispense Refill   acetaminophen (TYLENOL) 325 MG tablet Take 2 tablets (650 mg total) by mouth in the morning and at bedtime. 60 tablet 0   aspirin 81 MG EC tablet Take 81 mg by mouth daily.     blood glucose meter kit and supplies KIT Dispense based on patient and insurance preference. Use up to four times daily as directed. (FOR ICD-9 250.00, 250.01). 1 each 0   Blood Glucose Monitoring Suppl (TRUE METRIX METER) DEVI 1 kit by Does not apply route daily. Use as instructed to check blood sugar daily. 1 each 0   famotidine (PEPCID) 20 MG tablet Take 1 tablet (20 mg total) by mouth 2 (two) times daily. 30 tablet 0   glucose blood (TRUE METRIX BLOOD GLUCOSE TEST) test strip Use as instructed to check blood sugar daily. 100 each 11   ondansetron (ZOFRAN-ODT) 4 MG disintegrating tablet Take 1 tablet (4 mg total) by mouth every 8 (eight) hours as needed for nausea or vomiting. 20 tablet 0   pantoprazole (PROTONIX) 40 MG tablet Take 1 tablet (40 mg total) by mouth daily. 30 tablet 1   sucralfate (CARAFATE) 1 g tablet Take 1 tablet (1 g total) by  mouth 4 (four) times daily -  with meals and at bedtime. 21 tablet 0   TRUEplus Lancets 28G MISC Use as instructed to check blood sugar daily. 100 each 11   No current facility-administered medications on file prior to visit.    Objective:   Vitals:   03/03/22 1134  BP: 139/89  Pulse: 78  Temp: 98.4 F (36.9 C)  TempSrc: Oral  SpO2: 97%  Weight: 219 lb (99.3 kg)    Exam General appearance : Awake, alert, not in any distress. Speech Clear. Not toxic looking HEENT: Atraumatic and Normocephalic, pupils equally reactive to light and accomodation Neck: Supple, no JVD. No cervical lymphadenopathy.  Chest: Good air entry bilaterally, no added sounds  CVS: S1 S2 regular, no murmurs.  Abdomen: Bowel sounds present, Non tender and not distended with no gaurding, rigidity or rebound. Extremities: B/L Lower Ext shows no edema, both legs are warm to touch Neurology: Awake alert, and oriented X 3, Non focal Skin: No Rash  Data Review Lab Results  Component Value Date   HGBA1C 8.3 (A) 11/10/2021   HGBA1C 10.3 (A) 08/12/2021   HGBA1C 8.5 (A) 03/27/2021    Assessment & Plan   1. Type 2 diabetes mellitus with hyperlipidemia (HCC) Discussed  co- morbidities with uncontrol diabetes  Complications -diabetic retinopathy, (close your eyes ? What do you  see nothing) nephropathy decrease in kidney function- can lead to dialysis-on a machine 3 days a week to filter your kidney, neuropathy- numbness and tinging in your hands and feet,  increase risk of heart attack and stroke, and amputation due to decrease wound healing and circulation. Decrease your risk by taking medication daily as prescribed, monitor carbohydrates- foods that are high in carbohydrates are the following rice, potatoes, breads, sugars, and pastas.  Reduction in the intake (eating) will assist in lowering your blood sugars. Exercise daily at least 30 minutes daily.  - HgB A1c 10.3 - Increased insulin detemir (LEVEMIR) 100 UNIT/ML  FlexPen; Inject 26 units under the skin twice per day.  Dispense: 5 mL; Refill: 3  2. Essential hypertension, benign Counseled on blood pressure goal of less than 130/80, low-sodium, DASH diet, medication compliance, 150 minutes of moderate intensity exercise per week. Discussed medication compliance, adverse effects.  - amLODipine (NORVASC) 10 MG tablet; Take 1 tablet (10 mg total) by mouth daily.  Dispense: 90 tablet; Refill: 1 - losartan-hydrochlorothiazide (HYZAAR) 100-25 MG tablet; Take 1 tablet by mouth daily.  Dispense: 90 tablet; Refill: 1  3. Medication refill - amLODipine (NORVASC) 10 MG tablet; Take 1 tablet (10 mg total) by mouth daily.  Dispense: 90 tablet; Refill: 1 - glipiZIDE (GLUCOTROL) 10 MG tablet; Take 1 tablet (10 mg total) by mouth 2 (two) times daily before a meal.  Dispense: 180 tablet; Refill: 1 - insulin detemir (LEVEMIR) 100 UNIT/ML FlexPen; Inject 26 units under the skin twice per day.  Dispense: 5 mL; Refill: 3 - insulin aspart (NOVOLOG) cartridge; Inject 12 Units into the skin 3 (three) times daily with meals. 3 (three) times daily with meals. Sliding scale blood sugars 200-250 give 4 units of insulin, 251-300 give 6 units, 300-350 give 8 units, 351-400 give 10units, 351-400 ,> 400 give 12 units and call M.D. Discussed hypoglycemia protocol.  Dispense: 15 mL; Refill: 6 - losartan-hydrochlorothiazide (HYZAAR) 100-25 MG tablet; Take 1 tablet by mouth daily.  Dispense: 90 tablet; Refill: 1 - metFORMIN (GLUCOPHAGE) 1000 MG tablet; Take 1 tablet (1,000 mg total) by mouth 2 (two) times daily with a meal.  Dispense: 180 tablet; Refill: 1  4. Adjustment insomnia - Ambulatory referral to Psychiatry  5. Bipolar I disorder, most recent episode depressed (Brandon Mcneil) - Ambulatory referral to Psychiatry    Patient have been counseled extensively about nutrition and exercise. Other issues discussed during this visit include: low cholesterol diet, weight control and daily exercise,  foot care, annual eye examinations at Ophthalmology, importance of adherence with medications and regular follow-up. We also discussed long term complications of uncontrolled diabetes and hypertension.   Return in about 3 months (around 06/03/2022) for DM/HTN .  The patient was given clear instructions to go to ER or return to medical center if symptoms don't improve, worsen or new problems develop. The patient verbalized understanding. The patient was told to call to get lab results if they haven't heard anything in the next week.   This note has been created with Surveyor, quantity. Any transcriptional errors are unintentional.   Kerin Perna, NP 03/08/2022, 9:53 PM

## 2022-03-09 LAB — POCT GLYCOSYLATED HEMOGLOBIN (HGB A1C): Hemoglobin A1C: 10.3 % — AB (ref 4.0–5.6)

## 2022-06-03 ENCOUNTER — Ambulatory Visit (INDEPENDENT_AMBULATORY_CARE_PROVIDER_SITE_OTHER): Payer: Self-pay | Admitting: Primary Care

## 2022-06-07 ENCOUNTER — Encounter (HOSPITAL_COMMUNITY): Payer: Self-pay | Admitting: Emergency Medicine

## 2022-06-07 ENCOUNTER — Ambulatory Visit (HOSPITAL_COMMUNITY)
Admission: EM | Admit: 2022-06-07 | Discharge: 2022-06-07 | Disposition: A | Discharge status: Other Acute Inpatient Hospital | Payer: No Payment, Other | Attending: Psychiatry | Admitting: Psychiatry

## 2022-06-07 ENCOUNTER — Emergency Department (HOSPITAL_COMMUNITY)
Admission: EM | Admit: 2022-06-07 | Discharge: 2022-06-07 | Disposition: A | Discharge status: Home / Self Care | Payer: No Payment, Other | Attending: Emergency Medicine | Admitting: Emergency Medicine

## 2022-06-07 DIAGNOSIS — R739 Hyperglycemia, unspecified: Secondary | ICD-10-CM

## 2022-06-07 DIAGNOSIS — Z794 Long term (current) use of insulin: Secondary | ICD-10-CM | POA: Insufficient documentation

## 2022-06-07 DIAGNOSIS — E1165 Type 2 diabetes mellitus with hyperglycemia: Secondary | ICD-10-CM | POA: Diagnosis not present

## 2022-06-07 DIAGNOSIS — Z7982 Long term (current) use of aspirin: Secondary | ICD-10-CM | POA: Insufficient documentation

## 2022-06-07 DIAGNOSIS — F1010 Alcohol abuse, uncomplicated: Secondary | ICD-10-CM | POA: Insufficient documentation

## 2022-06-07 DIAGNOSIS — Z20822 Contact with and (suspected) exposure to covid-19: Secondary | ICD-10-CM | POA: Insufficient documentation

## 2022-06-07 DIAGNOSIS — R631 Polydipsia: Secondary | ICD-10-CM | POA: Insufficient documentation

## 2022-06-07 DIAGNOSIS — Z79899 Other long term (current) drug therapy: Secondary | ICD-10-CM | POA: Insufficient documentation

## 2022-06-07 DIAGNOSIS — L0291 Cutaneous abscess, unspecified: Secondary | ICD-10-CM

## 2022-06-07 DIAGNOSIS — F419 Anxiety disorder, unspecified: Secondary | ICD-10-CM | POA: Insufficient documentation

## 2022-06-07 DIAGNOSIS — R42 Dizziness and giddiness: Secondary | ICD-10-CM | POA: Insufficient documentation

## 2022-06-07 DIAGNOSIS — F319 Bipolar disorder, unspecified: Secondary | ICD-10-CM | POA: Diagnosis not present

## 2022-06-07 DIAGNOSIS — Z596 Low income: Secondary | ICD-10-CM | POA: Diagnosis not present

## 2022-06-07 DIAGNOSIS — F331 Major depressive disorder, recurrent, moderate: Secondary | ICD-10-CM

## 2022-06-07 DIAGNOSIS — L0501 Pilonidal cyst with abscess: Secondary | ICD-10-CM | POA: Insufficient documentation

## 2022-06-07 DIAGNOSIS — I10 Essential (primary) hypertension: Secondary | ICD-10-CM | POA: Insufficient documentation

## 2022-06-07 DIAGNOSIS — Z7984 Long term (current) use of oral hypoglycemic drugs: Secondary | ICD-10-CM | POA: Insufficient documentation

## 2022-06-07 DIAGNOSIS — E878 Other disorders of electrolyte and fluid balance, not elsewhere classified: Secondary | ICD-10-CM | POA: Insufficient documentation

## 2022-06-07 DIAGNOSIS — D72829 Elevated white blood cell count, unspecified: Secondary | ICD-10-CM | POA: Insufficient documentation

## 2022-06-07 DIAGNOSIS — F17210 Nicotine dependence, cigarettes, uncomplicated: Secondary | ICD-10-CM | POA: Insufficient documentation

## 2022-06-07 LAB — CBC WITH DIFFERENTIAL/PLATELET
Abs Immature Granulocytes: 0.04 10*3/uL (ref 0.00–0.07)
Basophils Absolute: 0 10*3/uL (ref 0.0–0.1)
Basophils Relative: 0 %
Eosinophils Absolute: 0.1 10*3/uL (ref 0.0–0.5)
Eosinophils Relative: 1 %
HCT: 39.7 % (ref 39.0–52.0)
Hemoglobin: 14.4 g/dL (ref 13.0–17.0)
Immature Granulocytes: 0 %
Lymphocytes Relative: 20 %
Lymphs Abs: 2.2 10*3/uL (ref 0.7–4.0)
MCH: 29.9 pg (ref 26.0–34.0)
MCHC: 36.3 g/dL — ABNORMAL HIGH (ref 30.0–36.0)
MCV: 82.5 fL (ref 80.0–100.0)
Monocytes Absolute: 1.2 10*3/uL — ABNORMAL HIGH (ref 0.1–1.0)
Monocytes Relative: 11 %
Neutro Abs: 7.5 10*3/uL (ref 1.7–7.7)
Neutrophils Relative %: 68 %
Platelets: 242 10*3/uL (ref 150–400)
RBC: 4.81 MIL/uL (ref 4.22–5.81)
RDW: 14.2 % (ref 11.5–15.5)
WBC: 11 10*3/uL — ABNORMAL HIGH (ref 4.0–10.5)
nRBC: 0 % (ref 0.0–0.2)

## 2022-06-07 LAB — RESP PANEL BY RT-PCR (FLU A&B, COVID) ARPGX2
Influenza A by PCR: NEGATIVE
Influenza B by PCR: NEGATIVE
SARS Coronavirus 2 by RT PCR: NEGATIVE

## 2022-06-07 LAB — CBG MONITORING, ED
Glucose-Capillary: 246 mg/dL — ABNORMAL HIGH (ref 70–99)
Glucose-Capillary: 252 mg/dL — ABNORMAL HIGH (ref 70–99)

## 2022-06-07 LAB — BASIC METABOLIC PANEL
Anion gap: 13 (ref 5–15)
BUN: 9 mg/dL (ref 6–20)
CO2: 20 mmol/L — ABNORMAL LOW (ref 22–32)
Calcium: 8.9 mg/dL (ref 8.9–10.3)
Chloride: 103 mmol/L (ref 98–111)
Creatinine, Ser: 0.84 mg/dL (ref 0.61–1.24)
GFR, Estimated: >60 mL/min (ref >60)
Glucose, Bld: 236 mg/dL — ABNORMAL HIGH (ref 70–99)
Potassium: 3.9 mmol/L (ref 3.5–5.1)
Sodium: 136 mmol/L (ref 135–145)

## 2022-06-07 LAB — GLUCOSE, CAPILLARY: Glucose-Capillary: 389 mg/dL — ABNORMAL HIGH (ref 70–99)

## 2022-06-07 LAB — BETA-HYDROXYBUTYRIC ACID: Beta-Hydroxybutyric Acid: 0.37 mmol/L — ABNORMAL HIGH (ref 0.05–0.27)

## 2022-06-07 MED ORDER — METFORMIN HCL 500 MG PO TABS
1000.0000 mg | ORAL_TABLET | Freq: Once | ORAL | Status: DC
  Filled 2022-06-07: qty 2

## 2022-06-07 MED ORDER — INSULIN ASPART PROT & ASPART (70-30 MIX) 100 UNIT/ML ~~LOC~~ SUSP
10.0000 [IU] | Freq: Once | SUBCUTANEOUS | Status: AC
  Administered 2022-06-07: 10 [IU] via SUBCUTANEOUS
  Filled 2022-06-07: qty 10

## 2022-06-07 MED ORDER — DOXYCYCLINE HYCLATE 100 MG PO CAPS: 100.0000 mg | ORAL_CAPSULE | Freq: Two times a day (BID) | ORAL | 0 refills | Status: DC

## 2022-06-07 NOTE — ED Triage Notes (Signed)
Pt sent from [facility] for hyperglycemia. Per nurse report, IVC is to be rescinded, but patient is currently still IVC'd.
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
## 2022-06-07 NOTE — ED Provider Notes (Cosign Needed Addendum)
[facility] DEPT Provider Note   CSN: [id number] Arrival date & time: 06/07/22  9150     History  Chief Complaint  Patient presents with   Hyperglycemia    Brandon Mcneil is a 41 y.o. male.   Hyperglycemia    Patient with medical history of hypertension, diabetes type 2, bipolar 1, hyperlipidemia and alcohol abuse presents today from [facility] by [facility] due to hyperglycemia (400s).  Patiently is currently involuntarily committed.  On my assessment patient is calm and cooperative.  Mentating appropriately.  He reports medical compliance to his metformin, glipizide and Lantus.  Denies any systemic symptoms.  He is not having any polyuria, polydipsia, vision changes, headache, nausea, vomiting, abdominal pain, chest pain, shortness of breath.  Home Medications Prior to Admission medications   Medication Sig Start Date End Date Taking? Authorizing Provider  acetaminophen (TYLENOL) 325 MG tablet Take 2 tablets (650 mg total) by mouth in the morning and at bedtime. 12/26/21  Yes Talbot Grumbling, FNP  amLODipine (NORVASC) 10 MG tablet Take 1 tablet (10 mg total) by mouth daily. 03/03/22  Yes Kerin Perna, NP  aspirin 81 MG EC tablet Take 81 mg by mouth daily.   Yes [provider]  doxycycline (VIBRAMYCIN) 100 MG capsule Take 1 capsule (100 mg total) by mouth 2 (two) times daily. 06/07/22  Yes Sherrill Raring, PA-C  insulin aspart (NOVOLOG) cartridge Inject 12 Units into the skin 3 (three) times daily with meals. 3 (three) times daily with meals. Sliding scale blood sugars 200-250 give 4 units of insulin, 251-300 give 6 units, 300-350 give 8 units, 351-400 give 10units, 351-400 ,> 400 give 12 units and call M.D. Discussed hypoglycemia protocol. 03/03/22  Yes Kerin Perna, NP  metFORMIN (GLUCOPHAGE) 1000 MG tablet Take 1 tablet (1,000 mg total) by mouth 2 (two) times daily with a meal. 03/03/22  Yes Kerin Perna, NP  blood glucose meter  kit and supplies KIT Dispense based on patient and insurance preference. Use up to four times daily as directed. (FOR ICD-9 250.00, 250.01). 03/13/20   Vanessa Kick, MD  Blood Glucose Monitoring Suppl (TRUE METRIX METER) DEVI 1 kit by Does not apply route daily. Use as instructed to check blood sugar daily. 08/09/19   Charlott Rakes, MD  famotidine (PEPCID) 20 MG tablet Take 1 tablet (20 mg total) by mouth 2 (two) times daily. Patient not taking: Reported on 06/07/2022 12/29/21   Carmin Muskrat, MD  glipiZIDE (GLUCOTROL) 10 MG tablet Take 1 tablet (10 mg total) by mouth 2 (two) times daily before a meal. Patient not taking: Reported on 06/07/2022 03/03/22   Kerin Perna, NP  glucose blood (TRUE METRIX BLOOD GLUCOSE TEST) test strip Use as instructed to check blood sugar daily. 08/09/19   Charlott Rakes, MD  hydrOXYzine (VISTARIL) 25 MG capsule Take 1 capsule (25 mg total) by mouth every 8 (eight) hours as needed. Patient not taking: Reported on 06/07/2022 03/03/22   Kerin Perna, NP  insulin detemir (LEVEMIR) 100 UNIT/ML FlexPen Inject 26 units under the skin twice per day. Patient not taking: Reported on 06/07/2022 03/03/22   Kerin Perna, NP  losartan-hydrochlorothiazide (HYZAAR) 100-25 MG tablet Take 1 tablet by mouth daily. Patient not taking: Reported on 06/07/2022 03/03/22   Kerin Perna, NP  ondansetron (ZOFRAN-ODT) 4 MG disintegrating tablet Take 1 tablet (4 mg total) by mouth every 8 (eight) hours as needed for nausea or vomiting. Patient not taking: Reported on 06/07/2022  12/26/21   Talbot Grumbling, FNP  pantoprazole (PROTONIX) 40 MG tablet Take 1 tablet (40 mg total) by mouth daily. Patient not taking: Reported on 06/07/2022 12/26/21   Talbot Grumbling, FNP  sucralfate (CARAFATE) 1 g tablet Take 1 tablet (1 g total) by mouth 4 (four) times daily -  with meals and at bedtime. Patient not taking: Reported on 06/07/2022 12/29/21   Carmin Muskrat, MD   TRUEplus Lancets 28G MISC Use as instructed to check blood sugar daily. 08/09/19   Charlott Rakes, MD      Allergies    Lisinopril and Penicillins    Review of Systems   Review of Systems  Physical Exam Updated Vital Signs BP (!) 144/87   Pulse 100   Temp 98.6 F (37 C)   Resp 18   SpO2 98%  Physical Exam Vitals and nursing note reviewed. Exam conducted with a chaperone present.  Constitutional:      Appearance: Normal appearance.  HENT:     Head: Normocephalic and atraumatic.  Eyes:     General: No scleral icterus.       Right eye: No discharge.        Left eye: No discharge.     Extraocular Movements: Extraocular movements intact.     Pupils: Pupils are equal, round, and reactive to light.  Cardiovascular:     Rate and Rhythm: Normal rate and regular rhythm.     Pulses: Normal pulses.     Heart sounds: Normal heart sounds. No murmur heard.    No friction rub. No gallop.  Pulmonary:     Effort: Pulmonary effort is normal. No respiratory distress.     Breath sounds: Normal breath sounds.  Abdominal:     General: Abdomen is flat. Bowel sounds are normal. There is no distension.     Palpations: Abdomen is soft.     Tenderness: There is no abdominal tenderness.  Skin:    General: Skin is warm and dry.     Coloration: Skin is not jaundiced.     Comments: Small less than 2 cm abscess gluteal cleft, actively draining.  Does not track to direct him  Neurological:     Mental Status: He is alert. Mental status is at baseline.     Coordination: Coordination normal.     ED Results / Procedures / Treatments   Labs (all labs ordered are listed, but only abnormal results are displayed) Labs Reviewed  CBC WITH DIFFERENTIAL/PLATELET - Abnormal; Notable for the following components:      Result Value   WBC 11.0 (*)    MCHC 36.3 (*)    Monocytes Absolute 1.2 (*)    All other components within normal limits  BASIC METABOLIC PANEL - Abnormal; Notable for the following  components:   CO2 20 (*)    Glucose, Bld 236 (*)    All other components within normal limits  BETA-HYDROXYBUTYRIC ACID - Abnormal; Notable for the following components:   Beta-Hydroxybutyric Acid 0.37 (*)    All other components within normal limits  CBG MONITORING, ED - Abnormal; Notable for the following components:   Glucose-Capillary 246 (*)    All other components within normal limits  CBG MONITORING, ED - Abnormal; Notable for the following components:   Glucose-Capillary 252 (*)    All other components within normal limits  RESP PANEL BY RT-PCR (FLU A&B, COVID) [id number]    EKG None  Radiology No results found.  Procedures Procedures    Medications  Ordered in ED Medications  metFORMIN (GLUCOPHAGE) tablet 1,000 mg (1,000 mg Oral Not Given 06/07/22 1011)  insulin aspart protamine- aspart (NOVOLOG MIX 70/30) injection 10 Units (10 Units Subcutaneous Given 06/07/22 1150)    ED Course/ Medical Decision Making/ A&P Clinical Course as of 06/07/22 1201  Sun Jun 07, 2022  0938 I consulted with psychiatry NP who saw the patient overnight Shalon Bobbitt.  Advises that patient is under IVC, recommends psychiatric reevaluation after medically cleared from hyperglycemia.  Appreciate her clarification. [HS]    Clinical Course User Index [HS] Sherrill Raring, PA-C                           Medical Decision Making Amount and/or Complexity of Data Reviewed Labs: ordered.  Risk Prescription drug management.   Patient presents due to hyperglycemia.  Differential includes not limited to AKI, DKA, hyperglycemia, electrolyte imbalance, metabolic derangement, arrhythmia.  On exam patient is very well-appearing.  He is calm and cooperative, abdomen is soft nontender and he is nontoxic-appearing.  Regular rhythm and pulse. -BP (!) 144/87   Pulse 100   Temp 98.6 F (37 C)   Resp 18   SpO2 98%   I reviewed external medical records including psychiatric evaluation from earlier this  morning.  He is under IVC secondary to concern about alcohol abuse. CBG 430 today.  -----IVC petition stated, patient drinks every other day and becomes a belligerent jerk, boluses nephew who is autistic. Koen currently threatens a swing when we did not comply with his wishes.  Simply his voices when he is under the influence and talk to people who are not there and sees shadows.  Lamin went voluntary to the program a few years ago and diagnosed with bipolar disorder and depression inpatient medication did very well.  After a while he relapsed and stopped taking his medications because he could not afford it he is diabetic and does take medication for that.  He sees Lanice Schwab is a practitioner that states that he has anxiety and depression but does not take the medication he is supposed to take for those disorders.  Ultimately has not put his hands on as he does have irrational behaviors like defecating in the bathtub and peeing on the floor in my room..  I ordered, viewed and interpreted laboratory work-up. -CBC with very mild leukocytosis of 11.  No anemia. -BMP without gross electrolyte derangement or AKI.  Patient is hyperglycemic at 236, bicarb very mildly decreased at 20 but patient clinically is not in DKA.  EKG shows sinus rhythm.  No electrolyte abnormalities.  I reviewed patient's medication list.  Still waiting for pharmacy to reconcile med list.  I did order patient's home dose of metformin and 10 units of NovoLog.    I reevaluated the patient was feeling well and unchanged.  Reassuring work-up.  Patient is not DKA.  No metabolic derangement.  Reassuring laboratory work-up and reevaluation.  At this time patient is stable and medically cleared for TTS evaluation.  Consult has been placed.  Patient requested I look at the abscess on his backside.  There is a small actively draining abscess, I do not indicated given actively draining.  Will cover with antibiotics.  Patient is been  evaluated by psychiatry TTS and cleared.  IVC has been rescinded.  Patient is stable at this time for discharge home.   Final Clinical Impression(s) / ED Diagnoses Final diagnoses:  Hyperglycemia  Abscess  Rx / DC Orders ED Discharge Orders          Ordered    doxycycline (VIBRAMYCIN) 100 MG capsule  2 times daily        06/07/22 1136              Sherrill Raring, PA-C 06/07/22 1004    Sherrill Raring, PA-C 06/07/22 1136    Sherrill Raring, PA-C 06/07/22 1201    Milton Ferguson, MD 06/08/22 1001

## 2022-06-07 NOTE — ED Triage Notes (Addendum)
Pt presents to [facility] under IVC, accompanied by [employer]. Pt stated" I was at home trying to sleep and now I'm here". Pt stated hx of anxiety, depression and Bipolar. Pt is not compliant with prescribed medications at this time. Ptt reports drinking 2 beers around 6-7pm.  Per IVC, " My nephew is an alcoholic. He drinks every other day and becomes a belligerent jerk. He bullies his nephew, who is autistic, and accuses him of doing things to his stuff. Abdul curses all of [place]. He also threatens [place] when we do not comply with his wishes. When he is under the influence he hears voices, talks to people that are not there and sees shadows. He went voluntarily to a program a few years ago and was diagnosed with Bipolar disorder and depression. He took the medication and he did very well. After a while he relapsed and stopped taking his medication because he could not afford it. He is diabetic and does take his medication for that. He sees Juluis Mire, a practitioner, that says he has anxiety and depression and does not take the medication he is supposed to take for those disorders. Although, Burney has not put his hands on [place], he does have irrational behaviors like defecating in the bath tub and peeing on the floor in my room". Pt currently denies SI, HI, AVH.

## 2022-06-07 NOTE — ED Notes (Signed)
[employer] called to transport pt. To [facility].

## 2022-06-07 NOTE — Consult Note (Cosign Needed Addendum)
[facility] Psych ED Discharge  06/07/2022 1:40 PM Brandon Mcneil  MRN:  [id number]  Method of visit?: Face to Face   Principal Problem: Alcohol abuse Discharge Diagnoses: Principal Problem:   Alcohol abuse   Subjective: Brandon Mcneil 41 year old African-American male was sent to [facility] Emergency department from [facility] urgent care due to alcohol intoxication.    Brandon Mcneil  was seen and evaluated face-to-face.  He is denying suicidal or homicidal ideations.  Denies auditory visual hallucinations.  Denied that he is followed by therapy and/or psychiatry currently.  Denies he is prescribed any psychotropic medications.  States " I had too much to drink and my brother wanted me to get evaluated."  Chart review BAL on admission was 252. Brandon Mcneil was offered additional outpatient admission for substance abuse however he declined.    AASHRITH EVES is sitting in no acute distress. He is alert/oriented x 3; calm/cooperative; and mood congruent with affect. He is speaking in a clear tone at moderate volume, and normal pace; with good eye contact. His thought process is coherent and relevant; There is no indication that he is currently responding to internal/external stimuli or experiencing delusional thought content; and he has denied suicidal/self-harm/homicidal ideation, psychosis, and paranoia.   Patient has remained calm throughout assessment and has answered questions appropriately.       Per ininital assement note: "Pt is a 41 year old single male who presents unaccompanied to [facility] via law enforcement after being petitioned for involuntary commitment by his uncle, Payton Mccallum Love [phone]. Affidavit and petition states: "My nephew is an alcoholic. He drinks every other day and becomes a belligerent jerk. He bullies his nephew, who is autistic, and accuses him of doing things to his stuff. Jebidiah curses all of [place]. He also threatens [place] when we do not comply with his wishes. When  he is under the influence he hears voices, talks to people that are not there and sees shadows. He went voluntarily to a program a few years ago and was diagnosed with bipolar disorder and depression. He took the medication and did very well. After a while he relapsed and stopped taking his medication because he could not afford it. "     Total Time spent with patient: 15 minutes  Past Psychiatric History:   Past Medical History:  Past Medical History:  Diagnosis Date   Depression    Diabetes ([place])    Essential hypertension 02/03/2017   Hypercholesteremia    Hypertension     Past Surgical History:  Procedure Laterality Date   INCISION AND DRAINAGE PERIRECTAL ABSCESS N/A 08/02/2020   Procedure: IRRIGATION AND DEBRIDEMENT PERIRECTAL ABSCESS;  Surgeon: Stark Klein, MD;  Location: [facility] ORS;  Service: General;  Laterality: N/A;   Family History:  Family History  Problem Relation Age of Onset   Healthy Mother    Healthy Father    Family Psychiatric  History:  Social History:  Social History   Substance and Sexual Activity  Alcohol Use Yes   Alcohol/week: 2.0 standard drinks of alcohol   Types: 2 Cans of beer per week     Social History   Substance and Sexual Activity  Drug Use No   Types: Marijuana    Social History   Socioeconomic History   Marital status: Single    Spouse name: Not on file   Number of children: 3   Years of education: 10th   Highest education level: Not on file  Occupational History   Occupation: unemployed  Tobacco Use   Smoking status: Every Day    Packs/day: 0.50    Years: 14.00    Total pack years: 7.00    Types: Cigarettes   Smokeless tobacco: Never   Tobacco comments:    1/2 pack a day  Vaping Use   Vaping Use: Never used  Substance and Sexual Activity   Alcohol use: Yes    Alcohol/week: 2.0 standard drinks of alcohol    Types: 2 Cans of beer per week   Drug use: No    Types: Marijuana   Sexual activity: Yes    Partners: Female     Birth control/protection: None  Other Topics Concern   Not on file  Social History Narrative   Originally from [place]   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   Social Determinants of Health   Financial Resource Strain: Not on file  Food Insecurity: Not on file  Transportation Needs: Not on file  Physical Activity: Not on file  Stress: Not on file  Social Connections: Not on file    Tobacco Cessation:  N/A, patient does not currently use tobacco products  Current Medications: Current Facility-Administered Medications  Medication Dose Route Frequency Provider Last Rate Last Admin   metFORMIN (GLUCOPHAGE) tablet 1,000 mg  1,000 mg Oral Once Sherrill Raring, PA-C       Current Outpatient Medications  Medication Sig Dispense Refill   acetaminophen (TYLENOL) 325 MG tablet Take 2 tablets (650 mg total) by mouth in the morning and at bedtime. 60 tablet 0   amLODipine (NORVASC) 10 MG tablet Take 1 tablet (10 mg total) by mouth daily. 90 tablet 1   aspirin 81 MG EC tablet Take 81 mg by mouth daily.     doxycycline (VIBRAMYCIN) 100 MG capsule Take 1 capsule (100 mg total) by mouth 2 (two) times daily. 20 capsule 0   insulin aspart (NOVOLOG) cartridge Inject 12 Units into the skin 3 (three) times daily with meals. 3 (three) times daily with meals. Sliding scale blood sugars 200-250 give 4 units of insulin, 251-300 give 6 units, 300-350 give 8 units, 351-400 give 10units, 351-400 ,> 400 give 12 units and call M.D. Discussed hypoglycemia protocol. 15 mL 6   metFORMIN (GLUCOPHAGE) 1000 MG tablet Take 1 tablet (1,000 mg total) by mouth 2 (two) times daily with a meal. 180 tablet 1   blood glucose meter kit and supplies KIT Dispense based on patient and insurance preference. Use up to four times daily as directed. (FOR ICD-9 250.00, 250.01). 1 each 0   Blood Glucose Monitoring Suppl (TRUE METRIX METER) DEVI 1 kit by Does not apply route  daily. Use as instructed to check blood sugar daily. 1 each 0   famotidine (PEPCID) 20 MG tablet Take 1 tablet (20 mg total) by mouth 2 (two) times daily. (Patient not taking: Reported on 06/07/2022) 30 tablet 0   glipiZIDE (GLUCOTROL) 10 MG tablet Take 1 tablet (10 mg total) by mouth 2 (two) times daily before a meal. (Patient not taking: Reported on 06/07/2022) 180 tablet 1   glucose blood (TRUE METRIX BLOOD GLUCOSE TEST) test strip Use as instructed to check blood sugar daily. 100 each 11   hydrOXYzine (VISTARIL) 25 MG capsule Take 1 capsule (25 mg total) by mouth every 8 (eight) hours as needed. (Patient not taking: Reported on 06/07/2022) 90 capsule 1   insulin detemir (LEVEMIR) 100 UNIT/ML FlexPen Inject  26 units under the skin twice per day. (Patient not taking: Reported on 06/07/2022) 5 mL 3   losartan-hydrochlorothiazide (HYZAAR) 100-25 MG tablet Take 1 tablet by mouth daily. (Patient not taking: Reported on 06/07/2022) 90 tablet 1   ondansetron (ZOFRAN-ODT) 4 MG disintegrating tablet Take 1 tablet (4 mg total) by mouth every 8 (eight) hours as needed for nausea or vomiting. (Patient not taking: Reported on 06/07/2022) 20 tablet 0   pantoprazole (PROTONIX) 40 MG tablet Take 1 tablet (40 mg total) by mouth daily. (Patient not taking: Reported on 06/07/2022) 30 tablet 1   sucralfate (CARAFATE) 1 g tablet Take 1 tablet (1 g total) by mouth 4 (four) times daily -  with meals and at bedtime. (Patient not taking: Reported on 06/07/2022) 21 tablet 0   TRUEplus Lancets 28G MISC Use as instructed to check blood sugar daily. 100 each 11   PTA Medications: (Not in a hospital admission)   Musculoskeletal: Strength & Muscle Tone: within normal limits Gait & Station: normal Patient leans: N/A  Psychiatric Specialty Exam:  Presentation  General Appearance:  Casual  Eye Contact: Fair  Speech: Clear and Coherent  Speech Volume: Normal  Handedness:No data recorded  Mood and Affect   Mood: Depressed  Affect: Congruent   Thought Process  Thought Processes: Coherent  Descriptions of Associations:Intact  Orientation:Full (Time, Place and Person)  Thought Content:No data recorded History of Schizophrenia/Schizoaffective disorder:No  Duration of Psychotic Symptoms:No data recorded Hallucinations:Hallucinations: None  Ideas of Reference:None  Suicidal Thoughts:Suicidal Thoughts: No  Homicidal Thoughts:Homicidal Thoughts: No   Sensorium  Memory: Immediate Good; Recent Good; Remote Good  Judgment: Poor  Insight: Poor   Executive Functions  Concentration: Fair  Attention Span: Fair  Recall:No data recorded Fund of Knowledge: Fair  Language: Fair   Psychomotor Activity  Psychomotor Activity: Psychomotor Activity: Normal   Assets  Assets: Armed forces logistics/support/administrative officer; Housing; Resilience; Social Support; Transportation   Sleep  Sleep: Sleep: Poor Number of Hours of Sleep: -1    Physical Exam: Physical Exam Vitals and nursing note reviewed.  HENT:     Head: Normocephalic.  Cardiovascular:     Rate and Rhythm: Normal rate and regular rhythm.  Neurological:     Mental Status: He is alert and oriented to person, place, and time.  Psychiatric:        Mood and Affect: Mood normal.        Thought Content: Thought content normal.    Review of Systems  Respiratory: Negative.    Cardiovascular: Negative.   Gastrointestinal: Negative.   Psychiatric/Behavioral:  Negative for depression and suicidal ideas. The patient is nervous/anxious.   All other systems reviewed and are negative.  Blood pressure (!) 140/88, pulse 95, temperature 98 F (36.7 C), temperature source Oral, resp. rate 16, SpO2 97 %. There is no height or weight on file to calculate BMI.   Demographic Factors:  Male  Loss Factors: Financial problems/change in socioeconomic status  Historical Factors: Family history of mental illness or substance abuse  Risk  Reduction Factors:   Positive social support, Positive therapeutic relationship, and Positive coping skills or problem solving skills  Continued Clinical Symptoms:  Alcohol/Substance Abuse/Dependencies  Cognitive Features That Contribute To Risk:  Closed-mindedness    Suicide Risk:  Minimal: No identifiable suicidal ideation.  Patients presenting with no risk factors but with morbid ruminations; may be classified as minimal risk based on the severity of the depressive symptoms   Plan Of Care/Follow-up recommendations:  Activity:  as tolerated  Diet:  heart healthy Will rescinded involuntary commitment   Disposition: Take all medications as prescribed. Keep all follow-up appointments as scheduled.  Do not consume alcohol or use illegal drugs while on prescription medications. Report any adverse effects from your medications to your primary care provider promptly.  In the event of recurrent symptoms or worsening symptoms, call 911, a crisis hotline, or go to the nearest emergency department for evaluation.     Derrill Center, NP 06/07/2022, 1:40 PM

## 2022-06-07 NOTE — ED Notes (Signed)
[employer] arrived to transport pt to [facility] long ED

## 2022-06-07 NOTE — Discharge Instructions (Addendum)
Take the doxycycline twice daily for 7 days.  Taking home medication.  Return to the ED for new or concerning symptoms.  Follow-up with your primary to make sure the abscess fully resolved.
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
## 2022-06-07 NOTE — ED Provider Triage Note (Deleted)
Emergency Medicine Provider Triage Evaluation Note  Brandon Mcneil , a 41 y.o. male  was evaluated in triage.  Pt complains of hyperglycemia. Also has boil on bottom which is draining.  Denies polyuria, polydipsia, V/D, CP, SOB.   Review of Systems  Per HPI  Physical Exam  BP (!) 144/87   Pulse 100   Temp 98.6 F (37 C)   Resp 18   SpO2 98%  Gen:   Awake, no distress   Resp:  Normal effort  MSK:   Moves extremities without difficulty  Other:    Medical Decision Making  Medically screening exam initiated at 7:38 AM.  Appropriate orders placed.  Brandon Mcneil was informed that the remainder of the evaluation will be completed by another provider, this initial triage assessment does not replace that evaluation, and the importance of remaining in the ED until their evaluation is complete.     Sherrill Raring, [place] 06/07/22 [phone]

## 2022-06-29 ENCOUNTER — Ambulatory Visit (INDEPENDENT_AMBULATORY_CARE_PROVIDER_SITE_OTHER): Payer: Self-pay | Admitting: Primary Care

## 2022-07-23 ENCOUNTER — Encounter (INDEPENDENT_AMBULATORY_CARE_PROVIDER_SITE_OTHER): Payer: Self-pay | Admitting: Primary Care

## 2022-07-23 ENCOUNTER — Ambulatory Visit (INDEPENDENT_AMBULATORY_CARE_PROVIDER_SITE_OTHER): Payer: Self-pay | Admitting: Primary Care

## 2022-07-23 ENCOUNTER — Other Ambulatory Visit: Payer: Self-pay

## 2022-07-23 VITALS — BP 150/98 | HR 91 | Ht 68.0 in | Wt 225.6 lb

## 2022-07-23 DIAGNOSIS — Z760 Encounter for issue of repeat prescription: Secondary | ICD-10-CM

## 2022-07-23 DIAGNOSIS — Z0000 Encounter for general adult medical examination without abnormal findings: Secondary | ICD-10-CM

## 2022-07-23 DIAGNOSIS — Z6834 Body mass index (BMI) 34.0-34.9, adult: Secondary | ICD-10-CM

## 2022-07-23 DIAGNOSIS — E785 Hyperlipidemia, unspecified: Secondary | ICD-10-CM

## 2022-07-23 DIAGNOSIS — E6609 Other obesity due to excess calories: Secondary | ICD-10-CM

## 2022-07-23 DIAGNOSIS — Z2821 Immunization not carried out because of patient refusal: Secondary | ICD-10-CM

## 2022-07-23 DIAGNOSIS — E1169 Type 2 diabetes mellitus with other specified complication: Secondary | ICD-10-CM | POA: Diagnosis not present

## 2022-07-23 DIAGNOSIS — F17200 Nicotine dependence, unspecified, uncomplicated: Secondary | ICD-10-CM

## 2022-07-23 DIAGNOSIS — Z23 Encounter for immunization: Secondary | ICD-10-CM

## 2022-07-23 DIAGNOSIS — I10 Essential (primary) hypertension: Secondary | ICD-10-CM

## 2022-07-23 DIAGNOSIS — F1010 Alcohol abuse, uncomplicated: Secondary | ICD-10-CM

## 2022-07-23 MED ORDER — GLIPIZIDE 10 MG PO TABS
10.0000 mg | ORAL_TABLET | Freq: Two times a day (BID) | ORAL | 1 refills | Status: DC
  Filled 2022-07-23 – 2022-08-19 (×2): qty 60, 30d supply, fill #0

## 2022-07-23 MED ORDER — LANTUS SOLOSTAR 100 UNIT/ML ~~LOC~~ SOPN
14.0000 [IU] | PEN_INJECTOR | Freq: Two times a day (BID) | SUBCUTANEOUS | 99 refills | Status: DC
  Filled 2022-07-23: qty 9, 30d supply, fill #0

## 2022-07-23 MED ORDER — AMLODIPINE BESYLATE 10 MG PO TABS
10.0000 mg | ORAL_TABLET | Freq: Every day | ORAL | 1 refills | Status: DC
  Filled 2022-07-23 – 2022-11-23 (×2): qty 30, 30d supply, fill #0
  Filled 2022-12-31: qty 30, 30d supply, fill #1

## 2022-07-23 MED ORDER — INSULIN ASPART 100 UNIT/ML FLEXPEN
12.0000 [IU] | PEN_INJECTOR | Freq: Three times a day (TID) | SUBCUTANEOUS | 6 refills | Status: DC
  Filled 2022-07-23: qty 12, 30d supply, fill #0

## 2022-07-23 NOTE — Progress Notes (Signed)
10 units of from [facility].

## 2022-07-23 NOTE — Progress Notes (Signed)
Subjective:  Patient ID: Brandon Mcneil, male    DOB: 08-30-1980  Age: 41 y.o. MRN: [id number]  CC: Diabetes and Hypertension   HPI Brandon Mcneil presents for follow-up of diabetes. Patient does check blood sugar at home  Compliant with meds - Patient is unable to afford insulin but has assess to Lantus and been taking 8 units BID Checking CBGs? Yes 150-250  Fasting avg -   Postprandial average -  Exercising regularly? - No Watching carbohydrate intake? - Yes Neuropathy ? - No Hypoglycemic events - No  - Recovers with :   Pertinent ROS:  Polyuria - No Polydipsia - No Vision problems - No Hypertension - out of amlodipine .Patient has No headache, No chest pain, No abdominal pain - No Nausea, No new weakness tingling or numbness, No Cough - shortness of breath   Medications as noted below. Taking them regularly without complication/adverse reaction being reported today.   History Brandon Mcneil has a past medical history of Depression, Diabetes ([place]), Essential hypertension (02/03/2017), Hypercholesteremia, and Hypertension.   Brandon Mcneil has a past surgical history that includes Incision and drainage perirectal abscess (N/A, 08/02/2020).   His family history includes Healthy in his father and mother.Brandon Mcneil reports that Brandon Mcneil has been smoking cigarettes. Brandon Mcneil has a 7.00 pack-year smoking history. Brandon Mcneil has never used smokeless tobacco. Brandon Mcneil reports current alcohol use of about 2.0 standard drinks of alcohol per week. Brandon Mcneil reports that Brandon Mcneil does not use drugs.  Current Outpatient Medications on File Prior to Visit  Medication Sig Dispense Refill   acetaminophen (TYLENOL) 325 MG tablet Take 2 tablets (650 mg total) by mouth in the morning and at bedtime. 60 tablet 0   aspirin 81 MG EC tablet Take 81 mg by mouth daily.     blood glucose meter kit and supplies KIT Dispense based on patient and insurance preference. Use up to four times daily as directed. (FOR ICD-9 250.00, 250.01). 1 each 0   Blood  Glucose Monitoring Suppl (TRUE METRIX METER) DEVI 1 kit by Does not apply route daily. Use as instructed to check blood sugar daily. 1 each 0   glucose blood (TRUE METRIX BLOOD GLUCOSE TEST) test strip Use as instructed to check blood sugar daily. 100 each 11   metFORMIN (GLUCOPHAGE) 1000 MG tablet Take 1 tablet (1,000 mg total) by mouth 2 (two) times daily with a meal. 180 tablet 1   TRUEplus Lancets 28G MISC Use as instructed to check blood sugar daily. 100 each 11   No current facility-administered medications on file prior to visit.    ROS Comprehensive ROS Pertinent positive and negative noted in HPI    Objective:  Blood Pressure (Abnormal) 150/98 (BP Location: Left Arm, Patient Position: Sitting, Cuff Size: Normal)   Pulse 91   Height _0  (1.727 m)   Weight 225 lb 9.6 oz (102.3 kg)   Oxygen Saturation 98%   Body Mass Index 34.30 kg/m   BP Readings from Last 3 Encounters:  07/23/22 (Abnormal) 150/98  06/07/22 (Abnormal) 140/88  03/03/22 139/89    Wt Readings from Last 3 Encounters:  07/23/22 225 lb 9.6 oz (102.3 kg)  03/03/22 219 lb (99.3 kg)  12/29/21 215 lb (97.5 kg)   Physical exam: General: Vital signs reviewed.  Patient is well-developed and well-nourished,morbid obese male in no acute distress and cooperative with exam. Head: Normocephalic and atraumatic. Eyes: EOMI, conjunctivae normal, no scleral icterus. Neck: Supple, trachea midline, normal ROM, no JVD, masses, thyromegaly, or carotid bruit  present. Cardiovascular: RRR, S1 normal, S2 normal, no murmurs, gallops, or rubs. Pulmonary/Chest: Clear to auscultation bilaterally, no wheezes, rales, or rhonchi. Abdominal: Soft, non-tender, non-distended, BS +, no masses, organomegaly, or guarding present. Musculoskeletal: No joint deformities, erythema, or stiffness, ROM full and nontender. Extremities: No lower extremity edema bilaterally,  pulses symmetric and intact bilaterally. No cyanosis or  clubbing. Neurological: A&O x3, Strength is normal Skin: Warm, dry and intact. No rashes or erythema. Psychiatric: Normal mood and affect. speech and behavior is normal. Cognition and memory are normal.    Lab Results  Component Value Date   HGBA1C 10.3 (A) 03/09/2022   HGBA1C 8.3 (A) 11/10/2021   HGBA1C 10.3 (A) 08/12/2021    Lab Results  Component Value Date   WBC 11.0 (H) 06/07/2022   HGB 14.4 06/07/2022   HCT 39.7 06/07/2022   PLT 242 06/07/2022   GLUCOSE 236 (H) 06/07/2022   CHOL 105 07/23/2022   TRIG 101 07/23/2022   HDL 49 07/23/2022   LDLCALC 37 07/23/2022   ALT 22 12/29/2021   AST 23 12/29/2021   NA 136 06/07/2022   K 3.9 06/07/2022   CL 103 06/07/2022   CREATININE 0.84 06/07/2022   BUN 9 06/07/2022   CO2 20 (L) 06/07/2022   INR 1.0 10/22/2019   HGBA1C 10.3 (A) 03/09/2022     Assessment & Plan:   Brandon Mcneil was seen today for diabetes and hypertension.  Diagnoses and all orders for this visit:  Type 2 diabetes mellitus with hyperlipidemia ([place]) Discussed  co- morbidities with uncontrol diabetes  Complications -diabetic retinopathy, (close your eyes ? What do you see nothing) nephropathy decrease in kidney function- can lead to dialysis-on a machine 3 days a week to filter your kidney, neuropathy- numbness and tinging in your hands and feet,  increase risk of heart attack and stroke, and amputation due to decrease wound healing and circulation. Decrease your risk by taking medication daily as prescribed, monitor carbohydrates- foods that are high in carbohydrates are the following rice, potatoes, breads, sugars, and pastas.  Reduction in the intake (eating) will assist in lowering your blood sugars. Exercise daily at least 30 minutes daily.  -     POCT glycosylated hemoglobin (Hb A1C) 10.3  -     Lipid Panel  Essential hypertension, benign BP goal - < 130/80 Explained that having normal blood pressure is the goal and medications are helping to get to goal and  maintain normal blood pressure. DIET: Limit salt intake, read nutrition labels to check salt content, limit fried and high fatty foods  Avoid using multisymptom OTC cold preparations that generally contain sudafed which can rise BP. Consult with pharmacist on best cold relief products to use for persons with HTN EXERCISE Discussed incorporating exercise such as walking - 30 minutes most days of the week and can do in 10 minute intervals    -     amLODipine (NORVASC) 10 MG tablet; Take 1 tablet (10 mg total) by mouth daily.  Class 1 obesity due to excess calories without serious comorbidity with body mass index (BMI) of 34.0 to 34.9 in adult Obesity is 30-39 indicating an excess in caloric intake or underlining conditions. This may lead to other co-morbidities. Educated on lifestyle modifications of diet and exercise which may reduce obesity.   Brandon Mcneil was seen today for diabetes and hypertension.  Medication refill -     amLODipine (NORVASC) 10 MG tablet; Take 1 tablet (10 mg total) by mouth daily. -  glipiZIDE (GLUCOTROL) 10 MG tablet; Take 1 tablet (10 mg total) by mouth 2 (two) times daily before a meal. -     insulin aspart (NOVOLOG) cartridge; Inject 12 Units into the skin 3 (three) times daily with meals. 3 (three) times daily with meals. Sliding scale blood sugars 200-250 give 4 units of insulin, 251-300 give 6 units, 300-350 give 8 units, 351-400 give 10units, 351-400 ,> 400 give 12 units and call M.D. Discussed hypoglycemia protocol.  Tobacco use disorder 2/2 Alcohol abuse Referred to CSW. - I have recommended complete cessation of tobacco use. I have discussed various options available for assistance with tobacco cessation including over the counter methods (Nicotine gum, patch and lozenges). We also discussed prescription options (Chantix, Nicotine Inhaler / Nasal Spray). The patient is not interested in pursuing any prescription tobacco cessation options at this time. - Patient  declines at this time.  - Less than 5 minutes spent on counseling.   COVID-19 vaccination declined  Other orders -     insulin glargine (LANTUS SOLOSTAR) 100 UNIT/ML Solostar Pen; Inject 14 Units into the skin 2 (two) times daily.  Preventative health care   Influenza   Other orders -     insulin glargine (LANTUS SOLOSTAR) 100 UNIT/ML Solostar Pen; Inject 14 Units into the skin 2 (two) times daily.    I have discontinued Brandon Mcneil's pantoprazole, ondansetron, famotidine, insulin detemir, hydrOXYzine, and doxycycline. I have also changed his insulin aspart. Additionally, I am having him start on Lantus SoloStar. Lastly, I am having him maintain his True Metrix Blood Glucose Test, True Metrix Meter, TRUEplus Lancets 28G, blood glucose meter kit and supplies, aspirin EC, acetaminophen, metFORMIN, amLODipine, and glipiZIDE.  Meds ordered this encounter  Medications   amLODipine (NORVASC) 10 MG tablet    Sig: Take 1 tablet (10 mg total) by mouth daily.    Dispense:  90 tablet    Refill:  1    Order Specific Question:   Supervising Provider    Answer:   Tresa Garter [[id number]]   glipiZIDE (GLUCOTROL) 10 MG tablet    Sig: Take 1 tablet (10 mg total) by mouth 2 (two) times daily before a meal.    Dispense:  180 tablet    Refill:  1    Order Specific Question:   Supervising Provider    Answer:   Tresa Garter [[id number]]   insulin glargine (LANTUS SOLOSTAR) 100 UNIT/ML Solostar Pen    Sig: Inject 14 Units into the skin 2 (two) times daily.    Dispense:  15 mL    Refill:  PRN    Order Specific Question:   Supervising Provider    Answer:   Tresa Garter [[id number]]   insulin aspart (NOVOLOG) 100 UNIT/ML FlexPen    Sig: Inject 12 Units into the skin 3 (three) times daily with meals. 3 (three) times daily with meals. Sliding scale blood sugars 200-250 give 4 units of insulin, 251-300 give 6 units, 300-350 give 8 units, 351-400 give 10units, 351-400 ,> 400 give 12  units and call M.D. Discussed hypoglycemia protocol.    Dispense:  15 mL    Refill:  6    Order Specific Question:   Supervising Provider    Answer:   Tresa Garter [[id number]]     Follow-up:   Return in about 3 months (around 10/22/2022) for PCP.  The above assessment and management plan was discussed with the patient. The patient verbalized understanding of and has agreed  to the management plan. Patient is aware to call the clinic if symptoms fail to improve or worsen. Patient is aware when to return to the clinic for a follow-up visit. Patient educated on when it is appropriate to go to the emergency department.   Juluis Mire, NP-C

## 2022-07-26 ENCOUNTER — Other Ambulatory Visit (INDEPENDENT_AMBULATORY_CARE_PROVIDER_SITE_OTHER): Payer: Self-pay | Admitting: Primary Care

## 2022-07-26 LAB — MICROALBUMIN / CREATININE URINE RATIO
Creatinine, Urine: 148.8 mg/dL
Microalb/Creat Ratio: 3 mg/g creat (ref 0–29)
Microalbumin, Urine: 4.5 ug/mL

## 2022-07-26 LAB — LIPID PANEL
Chol/HDL Ratio: 2.1 ratio (ref 0.0–5.0)
Cholesterol, Total: 105 mg/dL (ref 100–199)
HDL: 49 mg/dL (ref >39)
LDL Chol Calc (NIH): 37 mg/dL (ref 0–99)
Triglycerides: 101 mg/dL (ref 0–149)
VLDL Cholesterol Cal: 19 mg/dL (ref 5–40)

## 2022-07-26 MED ORDER — PRAVASTATIN SODIUM 20 MG PO TABS: ORAL_TABLET | ORAL | 1 refills | Status: DC

## 2022-07-29 ENCOUNTER — Other Ambulatory Visit: Payer: Self-pay

## 2022-08-19 ENCOUNTER — Other Ambulatory Visit: Payer: Self-pay

## 2022-08-19 ENCOUNTER — Other Ambulatory Visit (INDEPENDENT_AMBULATORY_CARE_PROVIDER_SITE_OTHER): Payer: Self-pay | Admitting: Primary Care

## 2022-08-19 DIAGNOSIS — Z760 Encounter for issue of repeat prescription: Secondary | ICD-10-CM

## 2022-08-20 NOTE — Telephone Encounter (Signed)
Requested medication (s) are due for refill today -no  Requested medication (s) are on the active medication list - yes  Future visit scheduled -yes  Last refill: 03/03/22 #180 1RF  Notes to clinic: conflicting information- request states discontinued- notes states to maintain- sent for review   Requested Prescriptions  Pending Prescriptions Disp Refills   metFORMIN (GLUCOPHAGE) 1000 MG tablet 180 tablet 1    Sig: Take 1 tablet (1,000 mg total) by mouth 2 (two) times daily with a meal.     Endocrinology:  Diabetes - Biguanides Failed - 08/19/2022  9:39 AM      Failed - HBA1C is between 0 and 7.9 and within 180 days    Hemoglobin A1C  Date Value Ref Range Status  03/09/2022 10.3 (A) 4.0 - 5.6 % Final   Hgb A1c MFr Bld  Date Value Ref Range Status  01/25/2019 10.6 (H) 4.8 - 5.6 % Final    Comment:             Prediabetes: 5.7 - 6.4          Diabetes: >6.4          Glycemic control for adults with diabetes: <7.0          Failed - B12 Level in normal range and within 720 days    No results found for: "VITAMINB12"       Passed - Cr in normal range and within 360 days    Creatinine, Ser  Date Value Ref Range Status  06/07/2022 0.84 0.61 - 1.24 mg/dL Final         Passed - eGFR in normal range and within 360 days    GFR calc Af Amer  Date Value Ref Range Status  06/19/2020 117 >59 mL/min/1.73 Final    Comment:    **In accordance with recommendations from the NKF-ASN Task force,**   [facility] is in the process of updating its eGFR calculation to the   2021 CKD-EPI creatinine equation that estimates kidney function   without a race variable.    GFR, Estimated  Date Value Ref Range Status  06/07/2022 >60 >60 mL/min Final    Comment:    (NOTE) Calculated using the CKD-EPI Creatinine Equation (2021)    eGFR  Date Value Ref Range Status  08/12/2021 110 >59 mL/min/1.73 Final         Passed - Valid encounter within last 6 months    Recent Outpatient Visits           4  weeks ago Type 2 diabetes mellitus with hyperlipidemia ([place])   [place] [facility] Juluis Mire P, NP   5 months ago Type 2 diabetes mellitus with hyperlipidemia ([place])   [place] Juluis Mire P, NP   9 months ago Type 2 diabetes mellitus with hyperlipidemia ([place])   [place] Kerin Perna, NP   1 year ago Type 2 diabetes mellitus with hyperlipidemia ([place])   [place] [facility] Kerin Perna, NP   1 year ago Essential hypertension, benign   [place], [place], NP       Future Appointments             Tomorrow Daisy Blossom, Jarome Matin, [place]   In 2 months Oletta Lamas, Milford Cage, NP [place] -  CBC within normal limits and completed in the last 12 months    WBC  Date Value Ref Range Status  06/07/2022 11.0 (H) 4.0 - 10.5 K/uL Final   RBC  Date Value Ref Range Status  06/07/2022 4.81 4.22 - 5.81 MIL/uL Final   Hemoglobin  Date Value Ref Range Status  06/07/2022 14.4 13.0 - 17.0 g/dL Final  08/12/2021 15.4 13.0 - 17.7 g/dL Final   HCT  Date Value Ref Range Status  06/07/2022 39.7 39.0 - 52.0 % Final   Hematocrit  Date Value Ref Range Status  08/12/2021 43.2 37.5 - 51.0 % Final   MCHC  Date Value Ref Range Status  06/07/2022 36.3 (H) 30.0 - 36.0 g/dL Final   [facility]  Date Value Ref Range Status  06/07/2022 29.9 26.0 - 34.0 pg Final   MCV  Date Value Ref Range Status  06/07/2022 82.5 80.0 - 100.0 fL Final  08/12/2021 87 79 - 97 fL Final   No results found for: "PLTCOUNTKUC", "LABPLAT", "POCPLA" RDW  Date Value Ref Range Status  06/07/2022 14.2 11.5 - 15.5 % Final  08/12/2021 13.4 11.6 - 15.4 % Final            Requested Prescriptions  Pending Prescriptions Disp Refills   metFORMIN (GLUCOPHAGE) 1000 MG tablet 180 tablet 1    Sig: Take 1  tablet (1,000 mg total) by mouth 2 (two) times daily with a meal.     Endocrinology:  Diabetes - Biguanides Failed - 08/19/2022  9:39 AM      Failed - HBA1C is between 0 and 7.9 and within 180 days    Hemoglobin A1C  Date Value Ref Range Status  03/09/2022 10.3 (A) 4.0 - 5.6 % Final   Hgb A1c MFr Bld  Date Value Ref Range Status  01/25/2019 10.6 (H) 4.8 - 5.6 % Final    Comment:             Prediabetes: 5.7 - 6.4          Diabetes: >6.4          Glycemic control for adults with diabetes: <7.0          Failed - B12 Level in normal range and within 720 days    No results found for: "VITAMINB12"       Passed - Cr in normal range and within 360 days    Creatinine, Ser  Date Value Ref Range Status  06/07/2022 0.84 0.61 - 1.24 mg/dL Final         Passed - eGFR in normal range and within 360 days    GFR calc Af Amer  Date Value Ref Range Status  06/19/2020 117 >59 mL/min/1.73 Final    Comment:    **In accordance with recommendations from the NKF-ASN Task force,**   [facility] is in the process of updating its eGFR calculation to the   2021 CKD-EPI creatinine equation that estimates kidney function   without a race variable.    GFR, Estimated  Date Value Ref Range Status  06/07/2022 >60 >60 mL/min Final    Comment:    (NOTE) Calculated using the CKD-EPI Creatinine Equation (2021)    eGFR  Date Value Ref Range Status  08/12/2021 110 >59 mL/min/1.73 Final         Passed - Valid encounter within last 6 months    Recent Outpatient Visits           4 weeks ago Type 2 diabetes mellitus with  hyperlipidemia ([place])   [place] [facility] Kerin Perna, NP   5 months ago Type 2 diabetes mellitus with hyperlipidemia ([place])   [place] [facility] Kerin Perna, NP   9 months ago Type 2 diabetes mellitus with hyperlipidemia ([place])   [place] Kerin Perna, NP   1 year ago Type 2 diabetes mellitus with  hyperlipidemia ([place])   [place] Kerin Perna, NP   1 year ago Essential hypertension, benign   [place] Kerin Perna, NP       Future Appointments             Tomorrow Tresa Endo, [place]   In 2 months Oletta Lamas, Milford Cage, NP [place]            Passed - CBC within normal limits and completed in the last 12 months    WBC  Date Value Ref Range Status  06/07/2022 11.0 (H) 4.0 - 10.5 K/uL Final   RBC  Date Value Ref Range Status  06/07/2022 4.81 4.22 - 5.81 MIL/uL Final   Hemoglobin  Date Value Ref Range Status  06/07/2022 14.4 13.0 - 17.0 g/dL Final  08/12/2021 15.4 13.0 - 17.7 g/dL Final   HCT  Date Value Ref Range Status  06/07/2022 39.7 39.0 - 52.0 % Final   Hematocrit  Date Value Ref Range Status  08/12/2021 43.2 37.5 - 51.0 % Final   MCHC  Date Value Ref Range Status  06/07/2022 36.3 (H) 30.0 - 36.0 g/dL Final   [facility]  Date Value Ref Range Status  06/07/2022 29.9 26.0 - 34.0 pg Final   MCV  Date Value Ref Range Status  06/07/2022 82.5 80.0 - 100.0 fL Final  08/12/2021 87 79 - 97 fL Final   No results found for: "PLTCOUNTKUC", "LABPLAT", "POCPLA" RDW  Date Value Ref Range Status  06/07/2022 14.2 11.5 - 15.5 % Final  08/12/2021 13.4 11.6 - 15.4 % Final

## 2022-08-21 ENCOUNTER — Other Ambulatory Visit: Payer: Self-pay

## 2022-08-21 ENCOUNTER — Encounter: Payer: Self-pay | Admitting: Pharmacist

## 2022-08-21 ENCOUNTER — Ambulatory Visit: Payer: Self-pay | Attending: Critical Care Medicine | Admitting: Pharmacist

## 2022-08-21 DIAGNOSIS — E1169 Type 2 diabetes mellitus with other specified complication: Secondary | ICD-10-CM

## 2022-08-21 DIAGNOSIS — E785 Hyperlipidemia, unspecified: Secondary | ICD-10-CM

## 2022-08-21 MED ORDER — TRUE METRIX BLOOD GLUCOSE TEST VI STRP
ORAL_STRIP | 2 refills | Status: DC
  Filled 2022-08-21: qty 100, fill #0

## 2022-08-21 MED ORDER — TRUE METRIX METER W/DEVICE KIT
PACK | 0 refills | Status: DC
  Filled 2022-08-21: qty 1, fill #0

## 2022-08-21 MED ORDER — TRUEPLUS LANCETS 28G MISC
2 refills | Status: DC
  Filled 2022-08-21: qty 100, fill #0

## 2022-08-21 MED ORDER — METFORMIN HCL ER 500 MG PO TB24
500.0000 mg | ORAL_TABLET | Freq: Two times a day (BID) | ORAL | 2 refills | Status: DC
  Filled 2022-08-21: qty 60, 30d supply, fill #0

## 2022-08-21 MED ORDER — BASAGLAR KWIKPEN 100 UNIT/ML ~~LOC~~ SOPN
20.0000 [IU] | PEN_INJECTOR | Freq: Every day | SUBCUTANEOUS | 2 refills | Status: DC
  Filled 2022-08-21: qty 6, 30d supply, fill #0

## 2022-08-21 MED ORDER — TRUEPLUS 5-BEVEL PEN NEEDLES 32G X 4 MM MISC
2 refills | Status: AC
  Filled 2022-08-21: qty 100, fill #0

## 2022-08-21 NOTE — Progress Notes (Signed)
    S:     No chief complaint on file.  42 y.o. male who presents for diabetes evaluation, education, and management.  PMH is significant for T2DM w/ hx of DKA, HTN, bipolar I, tobacco use.  Patient was referred and last seen by Primary Care Provider, Juluis Mire, on 07/23/2022.  A1c has not been checked since July, 2023 but was 10.3%.   Today, patient arrives in good spirits and presents without any assistance.   Patient reports Diabetes was diagnosed in 2015. Reports being placed on insulin at dx. He does not have any known hx of clinical ASCVD, CHF, or CKD. No thyroid cancer hx or pancreatitis.   Family/Social History:  Fhx: no pertinent positives  Tobacco: current 0.5 PPD smoker Alcohol: 2 cans of beer weekly   Current diabetes medications include: glipizide 10 mg BID, Novolog 12u TID before meals, Lantus 14u BID, metformin 1000 mg BID Of note, I reviewed his pharmacy records here and he has not picked these medications up. He uses Humalog he gets from his aunt.   Patient reports adherence but will not take consistently. For example, he may take glipizide 1-2x weekly. He is injecting Humalog 14u BID before eating. He is not using Lantus or metformin consistently.   Insurance coverage: none  Patient denies hypoglycemic events.  Reported home blood sugars: 130 - 280   Patient reports nocturia (nighttime urination).  Patient reports neuropathy (nerve pain). Patient denies visual changes. Patient reports self foot exams.   Patient reported dietary habits:  -Tries to adhere to a diabetic diet  Patient-reported exercise habits: none reported    O:  7 day average blood glucose: no meter with him today.   No CGM in place.    Lab Results  Component Value Date   HGBA1C 10.3 (A) 03/09/2022   There were no vitals filed for this visit.  Lipid Panel     Component Value Date/Time   CHOL 105 07/23/2022 1526   TRIG 101 07/23/2022 1526   HDL 49 07/23/2022 1526    CHOLHDL 2.1 07/23/2022 1526   LDLCALC 37 07/23/2022 1526    Clinical Atherosclerotic Cardiovascular Disease (ASCVD): Yes  The ASCVD Risk score (Arnett DK, et al., 2019) failed to calculate for the following reasons:   The valid total cholesterol range is 130 to 320 mg/dL   A/P: Diabetes longstanding currently uncontrolled. Patient is able to verbalize appropriate hypoglycemia management plan. Medication adherence appears suboptimal. -Discontinued glipzide.  -Changed metformin to metformin 500 mg XR BID.  -Discontinued Lantus. Change to Basaglar 20u daily in the morning.  -Discontinued Humalog for now.  -Patient educated on purpose, proper use, and potential adverse effects of Basaglar, metformin 500 mg XR.  -Extensively discussed pathophysiology of diabetes, recommended lifestyle interventions, dietary effects on blood sugar control.  -Counseled on s/sx of and management of hypoglycemia.  -Next A1c anticipated at follow-up in 1 month.   Written patient instructions provided. Patient verbalized understanding of treatment plan.  Total time in face to face counseling 30 minutes.    Follow-up:  Pharmacist in 1 month.  Brandon Mcneil, PharmD, Brandon Mcneil, Brandon Mcneil [phone]

## 2022-08-24 ENCOUNTER — Other Ambulatory Visit: Payer: Self-pay

## 2022-09-01 ENCOUNTER — Telehealth: Payer: Self-pay | Admitting: Primary Care

## 2022-09-01 NOTE — Telephone Encounter (Signed)
-----  Message from Shann Medal, [place] sent at 09/01/2022  1:21 PM EST ----- Regarding: follow up Please follow up and navigate his needs. ----- Message ----- From: Kerin Perna, NP Sent: 07/23/2022   3:02 PM EST To: Shann Medal, LCSW  Patient would benefit from LCSW intervention

## 2022-09-01 NOTE — Telephone Encounter (Signed)
Called and LVM for patient to schedule an appt with courtney hall if still he desires one.

## 2022-09-24 ENCOUNTER — Ambulatory Visit: Payer: Self-pay | Admitting: Pharmacist

## 2022-09-25 ENCOUNTER — Ambulatory Visit: Payer: Self-pay | Admitting: Pharmacist

## 2022-10-22 ENCOUNTER — Ambulatory Visit (INDEPENDENT_AMBULATORY_CARE_PROVIDER_SITE_OTHER): Payer: Self-pay | Admitting: Primary Care

## 2022-10-22 ENCOUNTER — Encounter (INDEPENDENT_AMBULATORY_CARE_PROVIDER_SITE_OTHER): Payer: Self-pay | Admitting: Primary Care

## 2022-10-22 VITALS — BP 138/84 | HR 94 | Resp 16 | Ht 68.0 in | Wt 228.8 lb

## 2022-10-22 DIAGNOSIS — E1169 Type 2 diabetes mellitus with other specified complication: Secondary | ICD-10-CM

## 2022-10-22 DIAGNOSIS — E785 Hyperlipidemia, unspecified: Secondary | ICD-10-CM

## 2022-10-22 LAB — POCT GLYCOSYLATED HEMOGLOBIN (HGB A1C): HbA1c, POC (controlled diabetic range): 9.1 % — AB (ref 0.0–7.0)

## 2022-10-22 NOTE — Progress Notes (Signed)
Subjective:  Patient ID: Brandon Mcneil, male    DOB: 1981/02/20  Age: 42 y.o. MRN: [id number]  CC: Diabetes   HPI Brandon Mcneil presents for follow-up of diabetes. Patient does not check blood sugar at home. BS 180-210. Recently seen clinical pharmacist and changed diabetic medication regiment  Brandon Mcneil is depressed his girlfriend had a miscarriage 3 days ago. Denies polyuria , polydipsia, polyphagia or vision changes  Medications as noted below. Taking them regularly without complication/adverse reaction being reported today.   History Brandon Mcneil has a past medical history of Depression, Diabetes ([place]), Essential hypertension (02/03/2017), Hypercholesteremia, and Hypertension.   Brandon Mcneil has a past surgical history that includes Incision and drainage perirectal abscess (N/A, 08/02/2020).   His family history includes Healthy in his father and mother.Brandon Mcneil reports that Brandon Mcneil has been smoking cigarettes. Brandon Mcneil has a 7.00 pack-year smoking history. Brandon Mcneil has never used smokeless tobacco. Brandon Mcneil reports current alcohol use of about 2.0 standard drinks of alcohol per week. Brandon Mcneil reports that Brandon Mcneil does not use drugs.  Current Outpatient Medications on File Prior to Visit  Medication Sig Dispense Refill   acetaminophen (TYLENOL) 325 MG tablet Take 2 tablets (650 mg total) by mouth in the morning and at bedtime. 60 tablet 0   amLODipine (NORVASC) 10 MG tablet Take 1 tablet (10 mg total) by mouth daily. 90 tablet 1   aspirin 81 MG EC tablet Take 81 mg by mouth daily.     blood glucose meter kit and supplies KIT Dispense based on patient and insurance preference. Use up to four times daily as directed. (FOR ICD-9 250.00, 250.01). 1 each 0   Blood Glucose Monitoring Suppl (TRUE METRIX METER) w/Device KIT Use to check blood sugar three times daily. 1 kit 0   glucose blood (TRUE METRIX BLOOD GLUCOSE TEST) test strip Use to check blood sugar three times daily. 100 each 2   Insulin Glargine (BASAGLAR KWIKPEN) 100 UNIT/ML Inject  20 Units into the skin daily. 6 mL 2   Insulin Pen Needle (TRUEPLUS 5-BEVEL PEN NEEDLES) 32G X 4 MM MISC Use to inject Basaglar once daily. 100 each 2   metFORMIN (GLUCOPHAGE-XR) 500 MG 24 hr tablet Take 1 tablet (500 mg total) by mouth 2 (two) times daily with a meal. 60 tablet 2   pravastatin (PRAVACHOL) 20 MG tablet Take 1 tablet every other day at bedtime 90 tablet 1   TRUEplus Lancets 28G MISC Use to check blood sugar three times daily. 100 each 2   No current facility-administered medications on file prior to visit.    ROS Comprehensive ROS Pertinent positive and negative noted in HPI    Objective:  Blood Pressure 138/84   Pulse 94   Respiration 16   Height 5\' 8"  (1.727 m)   Weight 228 lb 12.8 oz (103.8 kg)   Oxygen Saturation 98%   Body Mass Index 34.79 kg/m   BP Readings from Last 3 Encounters:  10/22/22 138/84  07/23/22 (Abnormal) 150/98  06/07/22 (Abnormal) 140/88    Wt Readings from Last 3 Encounters:  10/22/22 228 lb 12.8 oz (103.8 kg)  07/23/22 225 lb 9.6 oz (102.3 kg)  03/03/22 219 lb (99.3 kg)    Physical Exam Vitals reviewed.  Constitutional:      Appearance: Brandon Mcneil is obese.  HENT:     Head: Normocephalic.  Eyes:     Extraocular Movements: Extraocular movements intact.  Cardiovascular:     Rate and Rhythm: Normal rate and regular rhythm.  Pulmonary:  Effort: Pulmonary effort is normal.     Breath sounds: Normal breath sounds.  Abdominal:     General: There is distension.     Palpations: Abdomen is soft.  Musculoskeletal:        General: Normal range of motion.     Cervical back: Normal range of motion and neck supple.  Skin:    General: Skin is warm and dry.  Neurological:     Mental Status: Brandon Mcneil is oriented to person, place, and time.  Psychiatric:        Mood and Affect: Mood normal.        Behavior: Behavior normal.    Lab Results  Component Value Date   HGBA1C 9.1 (A) 10/22/2022   HGBA1C 10.3 (A) 03/09/2022   HGBA1C 8.3 (A)  11/10/2021    Lab Results  Component Value Date   WBC 11.0 (H) 06/07/2022   HGB 14.4 06/07/2022   HCT 39.7 06/07/2022   PLT 242 06/07/2022   GLUCOSE 236 (H) 06/07/2022   CHOL 105 07/23/2022   TRIG 101 07/23/2022   HDL 49 07/23/2022   LDLCALC 37 07/23/2022   ALT 22 12/29/2021   AST 23 12/29/2021   NA 136 06/07/2022   K 3.9 06/07/2022   CL 103 06/07/2022   CREATININE 0.84 06/07/2022   BUN 9 06/07/2022   CO2 20 (L) 06/07/2022   INR 1.0 10/22/2019   HGBA1C 9.1 (A) 10/22/2022     Assessment & Plan:   Brandon Mcneil was seen today for diabetes.  Diagnoses and all orders for this visit:  Type 2 diabetes mellitus with hyperlipidemia ([place]) - educated on lifestyle modifications, including but not limited to diet choices and adding exercise to daily routine.   -     POCT glycosylated hemoglobin (Hb A1C) 9.1   I am having Brandon Mcneil maintain his blood glucose meter kit and supplies, aspirin EC, acetaminophen, amLODipine, pravastatin, Basaglar KwikPen, metFORMIN, True Metrix Blood Glucose Test, True Metrix Meter, TRUEplus Lancets 28G, and TRUEplus 5-Bevel Pen Needles.  No orders of the defined types were placed in this encounter.    Follow-up:   No follow-ups on file.  The above assessment and management plan was discussed with the patient. The patient verbalized understanding of and has agreed to the management plan. Patient is aware to call the clinic if symptoms fail to improve or worsen. Patient is aware when to return to the clinic for a follow-up visit. Patient educated on when it is appropriate to go to the emergency department.   Juluis Mire, NP-C

## 2022-10-23 ENCOUNTER — Telehealth: Payer: Self-pay | Admitting: Emergency Medicine

## 2022-10-23 NOTE — Telephone Encounter (Signed)
Copied from [place] [phone]. Topic: General - Inquiry >> Oct 23, 2022 10:22 AM Brandon Mcneil wrote: Reason for CRM: Pt stated PCP has a letter that she needs to write for him. However, she advised him to call and ask if it was ready before he came to pick it up.   Please advise.

## 2022-10-23 NOTE — Telephone Encounter (Signed)
Patient called back in to check on the status of a letter he was told would be ready today. Please assist patient further

## 2022-10-28 ENCOUNTER — Telehealth (INDEPENDENT_AMBULATORY_CARE_PROVIDER_SITE_OTHER): Payer: Self-pay | Admitting: Primary Care

## 2022-10-28 NOTE — Telephone Encounter (Signed)
Attempted to reach patient to ask him he if still needed the letter he requested from his PCP. Left a message for him to call the office if he needed further assistants.

## 2022-10-30 ENCOUNTER — Other Ambulatory Visit: Payer: Self-pay

## 2022-10-30 ENCOUNTER — Ambulatory Visit (HOSPITAL_COMMUNITY)
Admission: EM | Admit: 2022-10-30 | Discharge: 2022-10-30 | Disposition: A | Discharge status: Home / Self Care | Payer: 59 | Attending: Internal Medicine | Admitting: Internal Medicine

## 2022-10-30 ENCOUNTER — Encounter (HOSPITAL_COMMUNITY): Payer: Self-pay

## 2022-10-30 DIAGNOSIS — K047 Periapical abscess without sinus: Secondary | ICD-10-CM

## 2022-10-30 DIAGNOSIS — K0889 Other specified disorders of teeth and supporting structures: Secondary | ICD-10-CM | POA: Diagnosis not present

## 2022-10-30 MED ORDER — CLINDAMYCIN HCL 300 MG PO CAPS
300.0000 mg | ORAL_CAPSULE | Freq: Three times a day (TID) | ORAL | 0 refills | Status: AC
  Filled 2022-10-30: qty 30, 10d supply, fill #0

## 2022-10-30 NOTE — ED Triage Notes (Signed)
Patient states over the past 3 days his gums have swelled and are "hanging out". States he has a hx of boils on his bottom and has one that will drain intermittently since the last time it was lanced.

## 2022-10-30 NOTE — ED Provider Notes (Signed)
[facility]   Note:  This document was prepared using Dragon voice recognition software and may include unintentional dictation errors.  MRN: [id number] DOB: 27-Feb-1981 DATE: 10/30/22   Subjective:  Chief Complaint:  Chief Complaint  Patient presents with   Oral Swelling     HPI: Brandon Mcneil is a 42 y.o. male presenting for dental pain and gingival swelling for the past 3 days.  He states symptoms started after eating ham at a friend's house.  Pain worse with eating and talking.  Patient is a diabetic. His blood glucose was 260 this morning prior to insulin injection.  He states his last A1c was 9.  He has not taken anything for the pain he states.  He does have a history of a loose tooth on the left side.  Denies fever, nausea/vomiting, abdominal pain. Presents NAD.  Prior to Admission medications   Medication Sig Start Date End Date Taking? Authorizing Provider  clindamycin (CLEOCIN) 300 MG capsule Take 1 capsule (300 mg total) by mouth every 8 (eight) hours for 10 days. 10/30/22 11/09/22 Yes Nyjah Denio P, PA-C  acetaminophen (TYLENOL) 325 MG tablet Take 2 tablets (650 mg total) by mouth in the morning and at bedtime. 12/26/21   Talbot Grumbling, FNP  amLODipine (NORVASC) 10 MG tablet Take 1 tablet (10 mg total) by mouth daily. 07/23/22   Kerin Perna, NP  aspirin 81 MG EC tablet Take 81 mg by mouth daily.    [provider]  blood glucose meter kit and supplies KIT Dispense based on patient and insurance preference. Use up to four times daily as directed. (FOR ICD-9 250.00, 250.01). 03/13/20   Vanessa Kick, MD  Blood Glucose Monitoring Suppl (TRUE METRIX METER) w/Device KIT Use to check blood sugar three times daily. 08/21/22   Charlott Rakes, MD  glucose blood (TRUE METRIX BLOOD GLUCOSE TEST) test strip Use to check blood sugar three times daily. 08/21/22   Charlott Rakes, MD  Insulin Glargine (BASAGLAR KWIKPEN) 100 UNIT/ML Inject 20 Units into the  skin daily. 08/21/22   Charlott Rakes, MD  Insulin Pen Needle (TRUEPLUS 5-BEVEL PEN NEEDLES) 32G X 4 MM MISC Use to inject Basaglar once daily. 08/21/22   Charlott Rakes, MD  metFORMIN (GLUCOPHAGE-XR) 500 MG 24 hr tablet Take 1 tablet (500 mg total) by mouth 2 (two) times daily with a meal. 08/21/22   Charlott Rakes, MD  pravastatin (PRAVACHOL) 20 MG tablet Take 1 tablet every other day at bedtime 07/26/22   Kerin Perna, NP  TRUEplus Lancets 28G MISC Use to check blood sugar three times daily. 08/21/22   Charlott Rakes, MD     Allergies  Allergen Reactions   Lisinopril Swelling and Other (See Comments)    Lips and mouth became swollen   Penicillins Itching, Swelling and Rash    History:   Past Medical History:  Diagnosis Date   Depression    Diabetes ([place])    Essential hypertension 02/03/2017   Hypercholesteremia    Hypertension      Past Surgical History:  Procedure Laterality Date   INCISION AND DRAINAGE PERIRECTAL ABSCESS N/A 08/02/2020   Procedure: IRRIGATION AND DEBRIDEMENT PERIRECTAL ABSCESS;  Surgeon: Stark Klein, MD;  Location: [facility];  Service: General;  Laterality: N/A;    Family History  Problem Relation Age of Onset   Healthy Mother    Healthy Father     Social History   Tobacco Use   Smoking status: Every Day  Packs/day: 0.50    Years: 14.00    Additional pack years: 0.00    Total pack years: 7.00    Types: Cigarettes   Smokeless tobacco: Never   Tobacco comments:    1/2 pack a day  Vaping Use   Vaping Use: Never used  Substance Use Topics   Alcohol use: Yes    Alcohol/week: 2.0 standard drinks of alcohol    Types: 2 Cans of beer per week   Drug use: No    Types: Marijuana    Review of Systems  Constitutional:  Negative for fever.  HENT:  Positive for dental problem.   Gastrointestinal:  Negative for abdominal pain, nausea and vomiting.     Objective:   Vitals: BP 138/86 (BP Location: Left Arm)   Pulse 79   Temp 98.7 F (37.1  C) (Oral)   Resp 18   SpO2 97%   Physical Exam Constitutional:      General: He is not in acute distress.    Appearance: Normal appearance. He is well-developed. He is obese. He is not ill-appearing or toxic-appearing.  HENT:     Head: Normocephalic and atraumatic.     Mouth/Throat:     Dentition: Abnormal dentition. Dental tenderness and gingival swelling present.     Pharynx: Oropharynx is clear. Uvula midline. No posterior oropharyngeal erythema.     Tonsils: No tonsillar exudate or tonsillar abscesses.      Comments: Gingival swelling noted along the left lower jaw.  Broken tooth noted at the second molar.  Tenderness to palpation of the left lower jaw Cardiovascular:     Rate and Rhythm: Normal rate and regular rhythm.     Heart sounds: Normal heart sounds.  Pulmonary:     Effort: Pulmonary effort is normal.     Breath sounds: Normal breath sounds.     Comments: Clear to auscultation bilaterally  Abdominal:     General: Bowel sounds are normal.     Palpations: Abdomen is soft.     Tenderness: There is no abdominal tenderness.  Skin:    General: Skin is warm and dry.  Neurological:     General: No focal deficit present.     Mental Status: He is alert.  Psychiatric:        Mood and Affect: Mood and affect normal.     Results:  Labs: No results found for this or any previous visit (from the past 24 hour(s)).  Radiology: No results found.   UC Course/Treatments:  Procedures: Procedures   Medications Ordered in UC: Medications - No data to display   Assessment and Plan :     ICD-10-CM   1. Dental infection  K04.7     2. Pain, dental  K08.89      Dental infection: Afebrile, nontoxic-appearing, NAD. VSS. DDX includes but not limited to: Dental Abscess, Broken Tooth, Cavity Clindamycin was prescribed due to patient's penicillin allergy.  Recommend patient follow-up with a dentist soon as possible.  Recommend OTC analgesics as needed for pain.  Strict ED  precautions were given and patient verbalized understanding.  Pain, dental Afebrile, nontoxic-appearing, NAD. VSS. DDX includes but not limited to: Dental Abscess, Broken Tooth, Cavity Clindamycin was prescribed due to patient's penicillin allergy.  Recommend patient follow-up with a dentist soon as possible.  Recommend OTC analgesics as needed for pain.  Strict ED precautions were given and patient verbalized understanding.    ED Discharge Orders  Ordered    clindamycin (CLEOCIN) 300 MG capsule  Every 8 hours       Note to Pharmacy: [id number]   10/30/22 1148             I have reviewed the PDMP during this encounter.     Carmie End, PA-C 10/30/22 1218

## 2022-10-30 NOTE — Discharge Instructions (Addendum)
You were prescribed an antibiotic called Clindamycin. This is often used to treat dental infections; however, this antibiotic will not take care of the dental problem alone. It is very important that you see a dentist as soon as possible. The infection will continue to reoccur if the problem is not fixed by a dentist. If not allergic, you may use over the counter ibuprofen or acetaminophen as needed for the pain.

## 2022-11-04 ENCOUNTER — Other Ambulatory Visit: Payer: Self-pay

## 2022-11-23 ENCOUNTER — Other Ambulatory Visit: Payer: Self-pay

## 2022-11-23 ENCOUNTER — Other Ambulatory Visit (INDEPENDENT_AMBULATORY_CARE_PROVIDER_SITE_OTHER): Payer: Self-pay | Admitting: Primary Care

## 2022-11-23 DIAGNOSIS — Z760 Encounter for issue of repeat prescription: Secondary | ICD-10-CM

## 2022-11-23 DIAGNOSIS — I10 Essential (primary) hypertension: Secondary | ICD-10-CM

## 2022-11-24 NOTE — Telephone Encounter (Signed)
Requested Prescriptions  Refused Prescriptions Disp Refills   amLODipine (NORVASC) 10 MG tablet [Pharmacy Med Name: amLODIPine Besylate 10 MG Oral Tablet] 90 tablet 0    Sig: Take 1 tablet by mouth once daily     Cardiovascular: Calcium Channel Blockers 2 Passed - 11/23/2022 10:04 AM      Passed - Last BP in normal range    BP Readings from Last 1 Encounters:  10/30/22 138/86         Passed - Last Heart Rate in normal range    Pulse Readings from Last 1 Encounters:  10/30/22 79         Passed - Valid encounter within last 6 months    Recent Outpatient Visits           1 month ago Type 2 diabetes mellitus with hyperlipidemia (HCC)   [place] [facility] Grayce Sessions, NP   3 months ago Type 2 diabetes mellitus with hyperlipidemia [facility])   [place] [facility] Jennings, Butler L, RPH-CPP   4 months ago Type 2 diabetes mellitus with hyperlipidemia [facility])   [place] [facility] Grayce Sessions, NP   8 months ago Type 2 diabetes mellitus with hyperlipidemia [facility])   [place] [facility] Grayce Sessions, NP   1 year ago Type 2 diabetes mellitus with hyperlipidemia [facility])   [place] [facility] Grayce Sessions, NP       Future Appointments             In 2 months Randa Evens, Kinnie Scales, NP [place] [facility]

## 2022-11-27 ENCOUNTER — Other Ambulatory Visit: Payer: Self-pay

## 2022-12-31 ENCOUNTER — Other Ambulatory Visit: Payer: Self-pay

## 2023-01-01 ENCOUNTER — Other Ambulatory Visit: Payer: Self-pay

## 2023-01-04 ENCOUNTER — Ambulatory Visit (HOSPITAL_COMMUNITY)
Admission: EM | Admit: 2023-01-04 | Discharge: 2023-01-04 | Disposition: A | Discharge status: Home / Self Care | Payer: 59 | Attending: Internal Medicine | Admitting: Internal Medicine

## 2023-01-04 ENCOUNTER — Other Ambulatory Visit: Payer: Self-pay

## 2023-01-04 ENCOUNTER — Encounter (HOSPITAL_COMMUNITY): Payer: Self-pay | Admitting: Emergency Medicine

## 2023-01-04 DIAGNOSIS — K611 Rectal abscess: Secondary | ICD-10-CM | POA: Diagnosis not present

## 2023-01-04 MED ORDER — HYDROCODONE-ACETAMINOPHEN 5-325 MG PO TABS
1.0000 | ORAL_TABLET | Freq: Once | ORAL | Status: AC
  Administered 2023-01-04: 1 via ORAL

## 2023-01-04 MED ORDER — HYDROCODONE-ACETAMINOPHEN 5-325 MG PO TABS
ORAL_TABLET | ORAL | Status: AC
  Filled 2023-01-04: qty 1

## 2023-01-04 NOTE — ED Triage Notes (Signed)
Patient complains of an abscess to right buttocks.  History of the same.  Reports this abscess appeared 3 days ago.  At that time, noted 2 abscess.  Patient was using boil-eze and one shrank and disappeared and the other increased in size.    Left great toenail has turned dark. Notes nails are turning darker in general.  No known injury.  Wore a pair of new (too small shoes) recently.

## 2023-01-04 NOTE — Discharge Instructions (Signed)
Please go to the emergency department for further management of the perirectal abscess.

## 2023-01-04 NOTE — ED Notes (Signed)
Patient aware to show ED paperwork

## 2023-01-04 NOTE — ED Notes (Signed)
Patient is being discharged from the Urgent Care and sent to the Emergency Department via POV, patient is in need of higher level of care due to limited resource/perirectal abscess. Patient is aware and verbalizes understanding of plan of care.  Vitals:   01/04/23 1531  BP: (!) 149/89  Pulse: 73  Resp: 20  Temp: 98.5 F (36.9 C)  SpO2: 98%

## 2023-01-04 NOTE — ED Provider Notes (Signed)
[facility]    CSN: [id number] Arrival date & time: 01/04/23  1335      History   Chief Complaint Chief Complaint  Patient presents with   Abscess    HPI Brandon Mcneil is a 42 y.o. male with a history of perirectal abscess comes to urgent care with perirectal abscess of 3 days duration.  Pain is severe, throbbing with no known relieving factors.  Pain is aggravated by palpation and sitting down.  No fever or chills.  Patient has previously had incision and drainage in the emergency room.  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   HPI  Past Medical History:  Diagnosis Date   Depression    Diabetes (HCC)    Essential hypertension 02/03/2017   Hypercholesteremia    Hypertension     Patient Active Problem List   Diagnosis Date Noted   Alcohol abuse 06/07/2022   Abscess 08/02/2020   Stress 02/08/2017   Essential hypertension 02/03/2017   Type 2 diabetes mellitus with hyperlipidemia (HCC) 02/03/2017   Medication overdose 10/31/2013   Bipolar I disorder, most recent episode depressed (HCC) 09/11/2013   Tobacco use disorder 09/11/2013   Newly diagnosed diabetes (HCC) 09/11/2013   Dehydration 09/11/2013   Pain, dental 09/11/2013   DKA (diabetic ketoacidoses) 09/10/2013    Past Surgical History:  Procedure Laterality Date   INCISION AND DRAINAGE PERIRECTAL ABSCESS N/A 08/02/2020   Procedure: IRRIGATION AND DEBRIDEMENT PERIRECTAL ABSCESS;  Surgeon: Almond Lint, MD;  Location: [facility];  Service: General;  Laterality: N/A;       Home Medications    Prior to Admission medications   Medication Sig Start Date End Date Taking? Authorizing Provider  acetaminophen (TYLENOL) 325 MG tablet Take 2 tablets (650 mg total) by mouth in the morning and at bedtime. 12/26/21   Carlisle Beers, FNP  amLODipine (NORVASC) 10 MG tablet Take 1 tablet (10 mg total) by mouth daily. 07/23/22   Grayce Sessions, NP  aspirin 81 MG EC tablet Take 81  mg by mouth daily.    [provider]  blood glucose meter kit and supplies KIT Dispense based on patient and insurance preference. Use up to four times daily as directed. (FOR ICD-9 250.00, 250.01). 03/13/20   Mardella Layman, MD  Blood Glucose Monitoring Suppl (TRUE METRIX METER) w/Device KIT Use to check blood sugar three times daily. 08/21/22   Hoy Register, MD  glucose blood (TRUE METRIX BLOOD GLUCOSE TEST) test strip Use to check blood sugar three times daily. 08/21/22   Hoy Register, MD  Insulin Glargine (BASAGLAR KWIKPEN) 100 UNIT/ML Inject 20 Units into the skin daily. 08/21/22   Hoy Register, MD  Insulin Pen Needle (TRUEPLUS 5-BEVEL PEN NEEDLES) 32G X 4 MM MISC Use to inject Basaglar once daily. 08/21/22   Hoy Register, MD  metFORMIN (GLUCOPHAGE-XR) 500 MG 24 hr tablet Take 1 tablet (500 mg total) by mouth 2 (two) times daily with a meal. 08/21/22   Hoy Register, MD  pravastatin (PRAVACHOL) 20 MG tablet Take 1 tablet every other day at bedtime Patient not taking: Reported on 01/04/2023 07/26/22   Grayce Sessions, NP  TRUEplus Lancets 28G MISC Use to check blood sugar three times daily. 08/21/22   Hoy Register, MD    Family History Family History  Problem Relation Age of Onset   Healthy Mother    Healthy Father     Social History Social History   Tobacco Use  Smoking status: Every Day    Packs/day: 0.50    Years: 14.00    Additional pack years: 0.00    Total pack years: 7.00    Types: Cigarettes   Smokeless tobacco: Never   Tobacco comments:    1/2 pack a day  Vaping Use   Vaping Use: Never used  Substance Use Topics   Alcohol use: Yes    Alcohol/week: 2.0 standard drinks of alcohol    Types: 2 Cans of beer per week   Drug use: No    Types: Marijuana     Allergies   Lisinopril and Penicillins   Review of Systems Review of Systems As per HPI  Physical Exam Triage Vital Signs ED Triage Vitals  Enc Vitals Group     BP 01/04/23 1531 (!)  149/89     Pulse Rate 01/04/23 1531 73     Resp 01/04/23 1531 20     Temp 01/04/23 1531 98.5 F (36.9 C)     Temp Source 01/04/23 1531 Oral     SpO2 01/04/23 1531 98 %     Weight --      Height --      Head Circumference --      Peak Flow --      Pain Score 01/04/23 1528 10     Pain Loc --      Pain Edu? --      Excl. in GC? --    No data found.  Updated Vital Signs BP (!) 149/89 (BP Location: Left Arm)   Pulse 73   Temp 98.5 F (36.9 C) (Oral)   Resp 20   SpO2 98%   Visual Acuity Right Eye Distance:   Left Eye Distance:   Bilateral Distance:    Right Eye Near:   Left Eye Near:    Bilateral Near:     Physical Exam Vitals and nursing note reviewed.  Constitutional:      General: He is in acute distress.     Appearance: He is ill-appearing.  Cardiovascular:     Rate and Rhythm: Normal rate and regular rhythm.  Genitourinary:    Comments: Fluctuant abscess noted in the right perirectal area.  Abscess is fluctuant and measures about 3 inches in the longest diameter.  Abscess is fairly close to the anus.. Musculoskeletal:        General: Normal range of motion.  Neurological:     Mental Status: He is alert.      UC Treatments / Results  Labs (all labs ordered are listed, but only abnormal results are displayed) Labs Reviewed - No data to display  EKG   Radiology No results found.  Procedures Procedures (including critical care time)  Medications Ordered in UC Medications  HYDROcodone-acetaminophen (NORCO/VICODIN) 5-325 MG per tablet 1 tablet (1 tablet Oral Given 01/04/23 1604)    Initial Impression / Assessment and Plan / UC Course  I have reviewed the triage vital signs and the nursing notes.  Pertinent labs & imaging results that were available during my care of the patient were reviewed by me and considered in my medical decision making (see chart for details).     1.  Perirectal abscess: Patient is advised to go to the emergency department  for formal incision and drainage.  Patient will need better analgesia given the location and the size of the abscess as well as the proximity to the anal sphincter.  Patient agrees to go into the emergency department for incision  and drainage. Final Clinical Impressions(s) / UC Diagnoses   Final diagnoses:  Perirectal abscess     Discharge Instructions      Please go to the emergency department for further management of the perirectal abscess.    ED Prescriptions   None    PDMP not reviewed this encounter.   Merrilee Jansky, MD 01/04/23 [phone]

## 2023-01-06 ENCOUNTER — Observation Stay (HOSPITAL_COMMUNITY)
Admission: EM | Admit: 2023-01-06 | Discharge: 2023-01-07 | Disposition: A | Discharge status: Home / Self Care | Payer: 59 | Attending: Emergency Medicine | Admitting: Emergency Medicine

## 2023-01-06 ENCOUNTER — Emergency Department (HOSPITAL_COMMUNITY): Payer: 59

## 2023-01-06 ENCOUNTER — Observation Stay (HOSPITAL_COMMUNITY): Payer: 59

## 2023-01-06 ENCOUNTER — Encounter (HOSPITAL_COMMUNITY): Payer: Self-pay

## 2023-01-06 ENCOUNTER — Other Ambulatory Visit: Payer: Self-pay

## 2023-01-06 DIAGNOSIS — K632 Fistula of intestine: Secondary | ICD-10-CM | POA: Diagnosis not present

## 2023-01-06 DIAGNOSIS — M48061 Spinal stenosis, lumbar region without neurogenic claudication: Secondary | ICD-10-CM

## 2023-01-06 DIAGNOSIS — E669 Obesity, unspecified: Secondary | ICD-10-CM | POA: Diagnosis not present

## 2023-01-06 DIAGNOSIS — K604 Rectal fistula, unspecified: Secondary | ICD-10-CM | POA: Diagnosis present

## 2023-01-06 DIAGNOSIS — F32A Depression, unspecified: Secondary | ICD-10-CM | POA: Insufficient documentation

## 2023-01-06 DIAGNOSIS — E7800 Pure hypercholesterolemia, unspecified: Secondary | ICD-10-CM | POA: Insufficient documentation

## 2023-01-06 DIAGNOSIS — Z7984 Long term (current) use of oral hypoglycemic drugs: Secondary | ICD-10-CM | POA: Diagnosis not present

## 2023-01-06 DIAGNOSIS — L03032 Cellulitis of left toe: Secondary | ICD-10-CM | POA: Diagnosis not present

## 2023-01-06 DIAGNOSIS — K611 Rectal abscess: Secondary | ICD-10-CM | POA: Diagnosis not present

## 2023-01-06 DIAGNOSIS — K047 Periapical abscess without sinus: Secondary | ICD-10-CM | POA: Diagnosis not present

## 2023-01-06 DIAGNOSIS — Z79899 Other long term (current) drug therapy: Secondary | ICD-10-CM | POA: Insufficient documentation

## 2023-01-06 DIAGNOSIS — E1165 Type 2 diabetes mellitus with hyperglycemia: Secondary | ICD-10-CM | POA: Diagnosis present

## 2023-01-06 DIAGNOSIS — I10 Essential (primary) hypertension: Secondary | ICD-10-CM | POA: Insufficient documentation

## 2023-01-06 DIAGNOSIS — E1169 Type 2 diabetes mellitus with other specified complication: Secondary | ICD-10-CM

## 2023-01-06 DIAGNOSIS — L089 Local infection of the skin and subcutaneous tissue, unspecified: Secondary | ICD-10-CM | POA: Insufficient documentation

## 2023-01-06 DIAGNOSIS — R739 Hyperglycemia, unspecified: Secondary | ICD-10-CM | POA: Insufficient documentation

## 2023-01-06 DIAGNOSIS — Z794 Long term (current) use of insulin: Secondary | ICD-10-CM | POA: Diagnosis not present

## 2023-01-06 DIAGNOSIS — F17210 Nicotine dependence, cigarettes, uncomplicated: Secondary | ICD-10-CM | POA: Insufficient documentation

## 2023-01-06 DIAGNOSIS — M7989 Other specified soft tissue disorders: Secondary | ICD-10-CM | POA: Diagnosis not present

## 2023-01-06 DIAGNOSIS — L0231 Cutaneous abscess of buttock: Secondary | ICD-10-CM | POA: Diagnosis not present

## 2023-01-06 DIAGNOSIS — K610 Anal abscess: Secondary | ICD-10-CM | POA: Diagnosis not present

## 2023-01-06 HISTORY — DX: Spinal stenosis, lumbar region without neurogenic claudication: M48.061

## 2023-01-06 HISTORY — DX: Type 2 diabetes mellitus with hyperglycemia: E11.65

## 2023-01-06 LAB — CBC WITH DIFFERENTIAL/PLATELET
Abs Immature Granulocytes: 0.02 10*3/uL (ref 0.00–0.07)
Basophils Absolute: 0 10*3/uL (ref 0.0–0.1)
Basophils Relative: 0 %
Eosinophils Absolute: 0.1 10*3/uL (ref 0.0–0.5)
Eosinophils Relative: 2 %
HCT: 39 % (ref 39.0–52.0)
Hemoglobin: 14 g/dL (ref 13.0–17.0)
Immature Granulocytes: 0 %
Lymphocytes Relative: 24 %
Lymphs Abs: 1.6 10*3/uL (ref 0.7–4.0)
MCH: 29.8 pg (ref 26.0–34.0)
MCHC: 35.9 g/dL (ref 30.0–36.0)
MCV: 83 fL (ref 80.0–100.0)
Monocytes Absolute: 0.9 10*3/uL (ref 0.1–1.0)
Monocytes Relative: 13 %
Neutro Abs: 4 10*3/uL (ref 1.7–7.7)
Neutrophils Relative %: 61 %
Platelets: 260 10*3/uL (ref 150–400)
RBC: 4.7 MIL/uL (ref 4.22–5.81)
RDW: 13 % (ref 11.5–15.5)
WBC: 6.6 10*3/uL (ref 4.0–10.5)
nRBC: 0 % (ref 0.0–0.2)

## 2023-01-06 LAB — COMPREHENSIVE METABOLIC PANEL
ALT: 18 U/L (ref 0–44)
AST: 18 U/L (ref 15–41)
Albumin: 3.5 g/dL (ref 3.5–5.0)
Alkaline Phosphatase: 101 U/L (ref 38–126)
Anion gap: 9 (ref 5–15)
BUN: 13 mg/dL (ref 6–20)
CO2: 22 mmol/L (ref 22–32)
Calcium: 8.8 mg/dL — ABNORMAL LOW (ref 8.9–10.3)
Chloride: 104 mmol/L (ref 98–111)
Creatinine, Ser: 0.92 mg/dL (ref 0.61–1.24)
GFR, Estimated: >60 mL/min (ref >60)
Glucose, Bld: 305 mg/dL — ABNORMAL HIGH (ref 70–99)
Potassium: 4.5 mmol/L (ref 3.5–5.1)
Sodium: 135 mmol/L (ref 135–145)
Total Bilirubin: 0.5 mg/dL (ref 0.3–1.2)
Total Protein: 8.1 g/dL (ref 6.5–8.1)

## 2023-01-06 LAB — CBG MONITORING, ED: Glucose-Capillary: 207 mg/dL — ABNORMAL HIGH (ref 70–99)

## 2023-01-06 LAB — LACTIC ACID, PLASMA: Lactic Acid, Venous: 1.6 mmol/L (ref 0.5–1.9)

## 2023-01-06 MED ORDER — ACETAMINOPHEN 325 MG PO TABS: 650.0000 mg | ORAL_TABLET | ORAL | Status: DC | PRN

## 2023-01-06 MED ORDER — IOHEXOL 300 MG/ML  SOLN
100.0000 mL | Freq: Once | INTRAMUSCULAR | Status: AC | PRN
  Administered 2023-01-06: 100 mL via INTRAVENOUS

## 2023-01-06 MED ORDER — INSULIN ASPART 100 UNIT/ML IJ SOLN
0.0000 [IU] | Freq: Three times a day (TID) | INTRAMUSCULAR | Status: DC
  Administered 2023-01-07: 1 [IU] via SUBCUTANEOUS
  Administered 2023-01-07: 5 [IU] via SUBCUTANEOUS
  Filled 2023-01-06: qty 0.15

## 2023-01-06 MED ORDER — GADOBUTROL 1 MMOL/ML IV SOLN
10.0000 mL | Freq: Once | INTRAVENOUS | Status: AC | PRN
  Administered 2023-01-06: 10 mL via INTRAVENOUS

## 2023-01-06 MED ORDER — SODIUM CHLORIDE 0.9 % IV SOLN
1.0000 g | Freq: Three times a day (TID) | INTRAVENOUS | Status: DC
  Administered 2023-01-07 (×2): 1 g via INTRAVENOUS
  Filled 2023-01-06 (×4): qty 20

## 2023-01-06 MED ORDER — VANCOMYCIN HCL IN DEXTROSE 1-5 GM/200ML-% IV SOLN
1000.0000 mg | Freq: Once | INTRAVENOUS | Status: AC
  Administered 2023-01-06: 1000 mg via INTRAVENOUS
  Filled 2023-01-06: qty 200

## 2023-01-06 MED ORDER — INSULIN ASPART 100 UNIT/ML IJ SOLN
0.0000 [IU] | Freq: Every day | INTRAMUSCULAR | Status: DC
  Filled 2023-01-06: qty 0.05

## 2023-01-06 MED ORDER — METHOCARBAMOL 500 MG PO TABS: 1000.0000 mg | ORAL_TABLET | Freq: Four times a day (QID) | ORAL | Status: DC | PRN

## 2023-01-06 MED ORDER — CLINDAMYCIN PHOSPHATE 600 MG/50ML IV SOLN
600.0000 mg | Freq: Once | INTRAVENOUS | Status: AC
  Administered 2023-01-06: 600 mg via INTRAVENOUS
  Filled 2023-01-06: qty 50

## 2023-01-06 MED ORDER — FENTANYL CITRATE PF 50 MCG/ML IJ SOSY
50.0000 ug | PREFILLED_SYRINGE | Freq: Once | INTRAMUSCULAR | Status: AC
  Administered 2023-01-06: 50 ug via INTRAVENOUS
  Filled 2023-01-06: qty 1

## 2023-01-06 MED ORDER — METRONIDAZOLE 500 MG PO TABS
500.0000 mg | ORAL_TABLET | Freq: Three times a day (TID) | ORAL | 1 refills | Status: DC
  Filled 2023-01-06: qty 30, 10d supply, fill #0

## 2023-01-06 MED ORDER — ONDANSETRON HCL 4 MG/2ML IJ SOLN: 4.0000 mg | Freq: Four times a day (QID) | INTRAMUSCULAR | Status: DC | PRN

## 2023-01-06 MED ORDER — ACETAMINOPHEN 650 MG RE SUPP: 650.0000 mg | Freq: Four times a day (QID) | RECTAL | Status: DC | PRN

## 2023-01-06 MED ORDER — AMLODIPINE BESYLATE 10 MG PO TABS
10.0000 mg | ORAL_TABLET | Freq: Every day | ORAL | Status: DC
  Administered 2023-01-07: 10 mg via ORAL
  Filled 2023-01-06: qty 1

## 2023-01-06 MED ORDER — BASAGLAR KWIKPEN 100 UNIT/ML ~~LOC~~ SOPN: 20.0000 [IU] | PEN_INJECTOR | Freq: Every day | SUBCUTANEOUS | Status: DC

## 2023-01-06 MED ORDER — CELECOXIB 200 MG PO CAPS
200.0000 mg | ORAL_CAPSULE | Freq: Two times a day (BID) | ORAL | Status: DC
  Administered 2023-01-07: 200 mg via ORAL
  Filled 2023-01-06 (×3): qty 1

## 2023-01-06 MED ORDER — WITCH HAZEL-GLYCERIN EX PADS: MEDICATED_PAD | CUTANEOUS | Status: DC | PRN

## 2023-01-06 MED ORDER — POLYETHYLENE GLYCOL 3350 17 G PO PACK
34.0000 g | PACK | Freq: Two times a day (BID) | ORAL | Status: DC
  Administered 2023-01-07: 34 g via ORAL
  Filled 2023-01-06 (×2): qty 2

## 2023-01-06 MED ORDER — ONDANSETRON HCL 4 MG PO TABS: 4.0000 mg | ORAL_TABLET | Freq: Four times a day (QID) | ORAL | Status: DC | PRN

## 2023-01-06 MED ORDER — SODIUM CHLORIDE 0.9% FLUSH: 3.0000 mL | Freq: Two times a day (BID) | INTRAVENOUS | Status: DC

## 2023-01-06 MED ORDER — SODIUM CHLORIDE 0.9 % IV SOLN
1.0000 g | Freq: Once | INTRAVENOUS | Status: AC
  Administered 2023-01-06: 1 g via INTRAVENOUS
  Filled 2023-01-06: qty 20

## 2023-01-06 MED ORDER — ENOXAPARIN SODIUM 60 MG/0.6ML IJ SOSY
50.0000 mg | PREFILLED_SYRINGE | INTRAMUSCULAR | Status: DC
  Administered 2023-01-07: 50 mg via SUBCUTANEOUS
  Filled 2023-01-06 (×2): qty 0.6

## 2023-01-06 MED ORDER — LACTATED RINGERS IV BOLUS
1000.0000 mL | Freq: Once | INTRAVENOUS | Status: AC
  Administered 2023-01-06: 1000 mL via INTRAVENOUS

## 2023-01-06 MED ORDER — PRAVASTATIN SODIUM 20 MG PO TABS
20.0000 mg | ORAL_TABLET | ORAL | Status: DC
  Administered 2023-01-07: 20 mg via ORAL
  Filled 2023-01-06: qty 1

## 2023-01-06 MED ORDER — POLYETHYLENE GLYCOL 3350 17 G PO PACK: 17.0000 g | PACK | Freq: Two times a day (BID) | ORAL | Status: DC | PRN

## 2023-01-06 MED ORDER — METFORMIN HCL 500 MG PO TABS: 500.0000 mg | ORAL_TABLET | Freq: Every day | ORAL | Status: DC

## 2023-01-06 MED ORDER — OXYCODONE HCL 5 MG PO TABS: 5.0000 mg | ORAL_TABLET | ORAL | Status: DC | PRN

## 2023-01-06 MED ORDER — METHOCARBAMOL 1000 MG/10ML IJ SOLN: 1000.0000 mg | Freq: Four times a day (QID) | INTRAVENOUS | Status: DC | PRN

## 2023-01-06 MED ORDER — ACETAMINOPHEN 500 MG PO TABS
1000.0000 mg | ORAL_TABLET | Freq: Four times a day (QID) | ORAL | Status: DC
  Administered 2023-01-06 – 2023-01-07 (×3): 1000 mg via ORAL
  Filled 2023-01-06 (×3): qty 2

## 2023-01-06 MED ORDER — ASPIRIN 81 MG PO TBEC
81.0000 mg | DELAYED_RELEASE_TABLET | Freq: Every morning | ORAL | Status: DC
  Administered 2023-01-07: 81 mg via ORAL
  Filled 2023-01-06: qty 1

## 2023-01-06 MED ORDER — INSULIN GLARGINE-YFGN 100 UNIT/ML ~~LOC~~ SOLN
20.0000 [IU] | Freq: Every day | SUBCUTANEOUS | Status: DC
  Administered 2023-01-07: 20 [IU] via SUBCUTANEOUS
  Filled 2023-01-06 (×2): qty 0.2

## 2023-01-06 MED ORDER — SULFAMETHOXAZOLE-TRIMETHOPRIM 800-160 MG PO TABS
1.0000 | ORAL_TABLET | Freq: Two times a day (BID) | ORAL | 1 refills | Status: DC
  Filled 2023-01-06: qty 20, 10d supply, fill #0

## 2023-01-06 MED ORDER — LACTATED RINGERS IV BOLUS: 1000.0000 mL | Freq: Three times a day (TID) | INTRAVENOUS | Status: DC | PRN

## 2023-01-06 MED ORDER — INSULIN ASPART 100 UNIT/ML IJ SOLN
0.0000 [IU] | INTRAMUSCULAR | Status: DC
  Administered 2023-01-06: 5 [IU] via SUBCUTANEOUS
  Filled 2023-01-06: qty 0.15

## 2023-01-06 NOTE — H&P (Signed)
History and Physical    Patient: Brandon Mcneil WUJ:[id number] DOB: 1981-01-05 DOA: 01/06/2023 DOS: the patient was seen and examined on 01/06/2023 PCP: Grayce Sessions, NP  Patient coming from: Home  Chief Complaint:  Chief Complaint  Patient presents with   Abscess   Nail Problem    Black   HPI: Brandon Mcneil is a 42 y.o. male with medical history significant of prior abscesses versus fistula in the perirectal area for over 6 months time.  Patient was in his usual state of health till about 4 days ago when he reports new onset of swelling and pain in the right gluteal cleft inferior to the anus.  Over the last 4 days he had trouble defecating because of the pain.  Patient was taking bleach and water baths to the area and reports that swelling actually spontaneously drained earlier today with marked relief of pain.  However patient came to the ER for further evaluation of same.  Patient noted that there has been induration along the right Clute gluteal cleft for several for several weeks.  There is no report of fevers nausea vomiting or rigors.  ER course is notable for vitamin and vital stability.  Patient has been evaluated by surgery.   Review of Systems: As mentioned in the history of present illness. All other systems reviewed and are negative. Past Medical History:  Diagnosis Date   Abscess, perirectal 08/12/2020   Bipolar I disorder, most recent episode depressed (HCC) 09/11/2013   Depression    Essential hypertension 02/03/2017   Hypercholesteremia    Lumbar foraminal stenosis 01/06/2023   Poorly controlled diabetes mellitus (HCC) 01/06/2023   Tobacco use disorder 09/11/2013   Past Surgical History:  Procedure Laterality Date   INCISION AND DRAINAGE ABSCESS  05/2015   Left buttock   INCISION AND DRAINAGE ABSCESS  10/2015   "Natal cleft" - ED   INCISION AND DRAINAGE ABSCESS  02/2016   "natal cleft"   INCISION AND DRAINAGE ABSCESS  04/2018   left gluteal  abscess   INCISION AND DRAINAGE ABSCESS  03/2021   right gluteal   INCISION AND DRAINAGE INTRA ORAL ABSCESS  2015   INCISION AND DRAINAGE PERIRECTAL ABSCESS N/A 08/02/2020   Procedure: IRRIGATION AND DEBRIDEMENT PERIRECTAL ABSCESS;  Surgeon: Almond Lint, MD;  Location: [facility];  Service: General;  Laterality: N/A;   Social History:  reports that he has been smoking cigarettes. He has a 7.00 pack-year smoking history. He has never used smokeless tobacco. He reports current alcohol use of about 14.0 standard drinks of alcohol per week. He reports that he does not use drugs.  Allergies  Allergen Reactions   Lisinopril Swelling and Other (See Comments)    Lips and mouth became swollen   Penicillins Anaphylaxis, Itching, Swelling and Rash    Family History  Problem Relation Age of Onset   Healthy Mother    Healthy Father     Prior to Admission medications   Medication Sig Start Date End Date Taking? Authorizing Provider  metroNIDAZOLE (FLAGYL) 500 MG tablet Take 1 tablet (500 mg total) by mouth 3 (three) times daily. 01/06/23  Yes Karie Soda, MD  sulfamethoxazole-trimethoprim (BACTRIM DS) 800-160 MG tablet Take 1 tablet by mouth 2 (two) times daily. 01/06/23  Yes Karie Soda, MD  acetaminophen (TYLENOL) 325 MG tablet Take 2 tablets (650 mg total) by mouth in the morning and at bedtime. 12/26/21   Carlisle Beers, FNP  amLODipine (NORVASC) 10 MG tablet Take 1  tablet (10 mg total) by mouth daily. 07/23/22   Grayce Sessions, NP  aspirin 81 MG EC tablet Take 81 mg by mouth daily.    [provider]  blood glucose meter kit and supplies KIT Dispense based on patient and insurance preference. Use up to four times daily as directed. (FOR ICD-9 250.00, 250.01). 03/13/20   Mardella Layman, MD  Blood Glucose Monitoring Suppl (TRUE METRIX METER) w/Device KIT Use to check blood sugar three times daily. 08/21/22   Hoy Register, MD  glucose blood (TRUE METRIX BLOOD GLUCOSE TEST) test  strip Use to check blood sugar three times daily. 08/21/22   Hoy Register, MD  Insulin Glargine (BASAGLAR KWIKPEN) 100 UNIT/ML Inject 20 Units into the skin daily. 08/21/22   Hoy Register, MD  Insulin Pen Needle (TRUEPLUS 5-BEVEL PEN NEEDLES) 32G X 4 MM MISC Use to inject Basaglar once daily. 08/21/22   Hoy Register, MD  metFORMIN (GLUCOPHAGE-XR) 500 MG 24 hr tablet Take 1 tablet (500 mg total) by mouth 2 (two) times daily with a meal. 08/21/22   Hoy Register, MD  pravastatin (PRAVACHOL) 20 MG tablet Take 1 tablet every other day at bedtime 07/26/22   Grayce Sessions, NP  TRUEplus Lancets 28G MISC Use to check blood sugar three times daily. 08/21/22   Hoy Register, MD    Physical Exam: Vitals:   01/06/23 1900 01/06/23 1915 01/06/23 1930 01/06/23 1945  BP: (!) 158/92 (!) 168/99 (!) 159/88 (!) 143/89  Pulse: 69 73 67 65  Resp: 18 18  18   Temp:    98.2 F (36.8 C)  TempSrc:    Oral  SpO2: 99% 99% 96% 96%  Weight:      Height:       General: Patient does not appear to be in any distress Respiratory; bilateral intravesicular Cardiovascular exam S1-S2 normal Abdomen soft nontender Rectal area is inspected and examined.  There is induration and some hyperpigmentation along the right gluteal cleft.  About 4 cm inferior to the anus on the gluteal cleft on the right side is subcentimeter ulceration down to the cutaneous tissue with no foul smell or discharge or fluctuation noted.  There is no spreading edema. Data Reviewed:  Labs on Admission:  Results for orders placed or performed during the hospital encounter of 01/06/23 (from the past 24 hour(s))  CBC with Differential     Status: None   Collection Time: 01/06/23  3:40 PM  Result Value Ref Range   WBC 6.6 4.0 - 10.5 K/uL   RBC 4.70 4.22 - 5.81 MIL/uL   Hemoglobin 14.0 13.0 - 17.0 g/dL   HCT 16.1 09.6 - 04.5 %   MCV 83.0 80.0 - 100.0 fL   MCH 29.8 26.0 - 34.0 pg   MCHC 35.9 30.0 - 36.0 g/dL   RDW 40.9 81.1 - 91.4 %    Platelets 260 150 - 400 K/uL   nRBC 0.0 0.0 - 0.2 %   Neutrophils Relative % 61 %   Neutro Abs 4.0 1.7 - 7.7 K/uL   Lymphocytes Relative 24 %   Lymphs Abs 1.6 0.7 - 4.0 K/uL   Monocytes Relative 13 %   Monocytes Absolute 0.9 0.1 - 1.0 K/uL   Eosinophils Relative 2 %   Eosinophils Absolute 0.1 0.0 - 0.5 K/uL   Basophils Relative 0 %   Basophils Absolute 0.0 0.0 - 0.1 K/uL   Immature Granulocytes 0 %   Abs Immature Granulocytes 0.02 0.00 - 0.07 K/uL  Comprehensive  metabolic panel     Status: Abnormal   Collection Time: 01/06/23  3:40 PM  Result Value Ref Range   Sodium 135 135 - 145 mmol/L   Potassium 4.5 3.5 - 5.1 mmol/L   Chloride 104 98 - 111 mmol/L   CO2 22 22 - 32 mmol/L   Glucose, Bld 305 (H) 70 - 99 mg/dL   BUN 13 6 - 20 mg/dL   Creatinine, Ser 1.61 0.61 - 1.24 mg/dL   Calcium 8.8 (L) 8.9 - 10.3 mg/dL   Total Protein 8.1 6.5 - 8.1 g/dL   Albumin 3.5 3.5 - 5.0 g/dL   AST 18 15 - 41 U/L   ALT 18 0 - 44 U/L   Alkaline Phosphatase 101 38 - 126 U/L   Total Bilirubin 0.5 0.3 - 1.2 mg/dL   GFR, Estimated >09 >60 mL/min   Anion gap 9 5 - 15  Lactic acid, plasma     Status: None   Collection Time: 01/06/23  3:40 PM  Result Value Ref Range   Lactic Acid, Venous 1.6 0.5 - 1.9 mmol/L  CBG monitoring, ED     Status: Abnormal   Collection Time: 01/06/23  6:58 PM  Result Value Ref Range   Glucose-Capillary 207 (H) 70 - 99 mg/dL   Basic Metabolic Panel: Recent Labs  Lab 01/06/23 1540  NA 135  K 4.5  CL 104  CO2 22  GLUCOSE 305*  BUN 13  CREATININE 0.92  CALCIUM 8.8*   Liver Function Tests: Recent Labs  Lab 01/06/23 1540  AST 18  ALT 18  ALKPHOS 101  BILITOT 0.5  PROT 8.1  ALBUMIN 3.5   No results for input(s): "LIPASE", "AMYLASE" in the last 168 hours. No results for input(s): "AMMONIA" in the last 168 hours. CBC: Recent Labs  Lab 01/06/23 1540  WBC 6.6  NEUTROABS 4.0  HGB 14.0  HCT 39.0  MCV 83.0  PLT 260   Cardiac Enzymes: No results for  input(s): "CKTOTAL", "CKMB", "CKMBINDEX", "TROPONINIHS" in the last 168 hours.  BNP (last 3 results) No results for input(s): "PROBNP" in the last 8760 hours. CBG: Recent Labs  Lab 01/06/23 1858  GLUCAP 207*    Radiological Exams on Admission:  CT ABDOMEN PELVIS W CONTRAST  Result Date: 01/06/2023 CLINICAL DATA:  Right buttock abscess EXAM: CT ABDOMEN AND PELVIS WITH CONTRAST TECHNIQUE: Multidetector CT imaging of the abdomen and pelvis was performed using the standard protocol following bolus administration of intravenous contrast. RADIATION DOSE REDUCTION: This exam was performed according to the departmental dose-optimization program which includes automated exposure control, adjustment of the mA and/or kV according to patient size and/or use of iterative reconstruction technique. CONTRAST:  OMNIPAQUE IOHEXOL 300 MG/ML  SOLN COMPARISON:  08/02/2020 and 10/22/2019 FINDINGS: Lower chest: Mild lingular scarring or subsegmental atelectasis. Hepatobiliary: Contracted gallbladder.  Otherwise unremarkable. Pancreas: Unremarkable Spleen: Unremarkable Adrenals/Urinary Tract: Mild fullness of both adrenal glands without discrete mass. The kidneys and urinary bladder appear normal. Stomach/Bowel: Unremarkable.  Normal appendix. Vascular/Lymphatic: Prominent right inguinal lymph node 1.9 cm in short axis on image 81 series 2, formerly 1.7 cm on 08/02/2020. A left inguinal lymph node measures 1.1 cm in short axis on image 81 series 2, formerly 1.0 cm. No significant atheromatous vascular calcifications observed. Reproductive: Unremarkable Other: In the right buttock along medial side of the gluteal sulcus, there is abnormal bandlike extension of density extending from the external sphincter/lower perianal region into the lower margin of the buttock. This extends along  an approximately 5.9 cm vertical excursion and measures up to 1.3 by 4.3 cm in cross-section. Equivocal central hypodensity in portions of  this process for example image 96 series 2. Moderate amount of inflammatory stranding in the surrounding subcutaneous tissues. Looking back at prior exams, the patient has a history of similar findings in this region, including a larger drainable abscess in the region on 08/02/2020, for which he underwent incision and drainage on 08/02/2020. The persistence and potential recurrence of this process raises the possibility of otherwise occult perianal fistula as a cause for recurrent inflammation/infection. Although extension through the external sphincter is not well seen on today's CT, such extension is often times much better depicted by MRI. Perianal fistula protocol MRI is suggested for further characterization and for potential surgical planning. 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 IMPRESSION: 1. Abnormal bandlike extension of density from the external sphincter/lower perianal region into the lower margin of the right buttock with surrounding inflammatory stranding, in history of prior abscess incision and drainage in this vicinity in December 2021. The persistence and potential recurrence of this process raises the possibility of otherwise occult perianal fistula as a cause for recurrent inflammation/infection. Follow up perianal fistula protocol MRI with and without contrast is suggested for further characterization and potential surgical planning. 2. Mild bilateral inguinal adenopathy, probably reactive. 3. 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 Electronically Signed   By: Gaylyn Rong M.D.   On: 01/06/2023 16:38   DG Toe Great Left  Result Date: 01/06/2023 CLINICAL DATA:  Diabetes, discoloration left great toenail EXAM: LEFT GREAT TOE COMPARISON:  None Available. FINDINGS: Frontal, oblique, and lateral views of the left great toe are obtained. There are no acute or destructive bony abnormalities. Alignment  is anatomic. Joint spaces are well preserved. Mild diffuse soft tissue edema. No subcutaneous gas or radiopaque foreign body. IMPRESSION: 1. Soft tissue swelling.  No fracture or radiopaque foreign body. Electronically Signed   By: Sharlet Salina M.D.   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 Symptomatic for 4 days, acutally sefl drained at home today per patietn. On exam, distal to anus with no fluctuation,no tendernss and no discharge eliciet. Area ulceration to sub epithelial tissue is sub cm, with induration along the gluteal cleft. Will treat with iv abx with covereage for anerobes.  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 Will get mri pelvis.      Advance Care Planning:   Code Status: Full Code   Consults: surgery  Family Communication: per patietn.  Severity of Illness: The appropriate patient status for this patient is OBSERVATION. Observation status is judged to be reasonable and necessary in order to provide the required intensity of service to ensure the patient's safety. The patient's presenting symptoms, physical exam findings, and initial radiographic and laboratory data in the context of their medical condition is felt to place them at decreased risk for further clinical deterioration. Furthermore, it is anticipated that the patient will be medically stable for discharge from the hospital within 2 midnights of admission.   Author: Nolberto Hanlon, MD 01/06/2023 8:29 PM  For on call review [email or web address].

## 2023-01-06 NOTE — Progress Notes (Signed)
PHARMACY -  BRIEF ANTIBIOTIC NOTE   Pharmacy has received consult(s) for meropenem from an ED provider.  The patient's profile has been reviewed for ht/wt/allergies/indication/available labs.    Patient with allergy to penicillins. Has tolerated cephalosporins in the past.  One time order(s) placed for meropenem 1 g  Further antibiotics/pharmacy consults should be ordered by admitting physician if indicated.                       Thank you,  Pricilla Riffle, PharmD, BCPS Clinical Pharmacist 01/06/2023 3:36 PM

## 2023-01-06 NOTE — Consult Note (Addendum)
Brandon Mcneil  07/24/1981 [id number]  CARE TEAM:  PCP: Grayce Sessions, NP  Outpatient Care Team: Patient Care Team: Grayce Sessions, NP as PCP - General (Internal Medicine) Almond Lint, MD as Consulting Physician (General Surgery)  Inpatient Treatment Team: Treatment Team: Attending Provider: Lonell Grandchild, MD; Technician: Maryan Rued; Registered Nurse: Dellis Filbert, RN; Physician Assistant: Pete Pelt, PA; Consulting Physician: Montez Morita, Md, MD   This patient is a 42 y.o.male who presents today for surgical evaluation at the request of Barnie Alderman, [place] - [facility] ED.   Chief complaint / Reason for evaluation: Recurrent perianal pain and drainage.  Probable fistula.  42 year old insulin requiring male.  Smoker.  Required urgent incision and drainage of perirectal abscess December 2021 by Dr. Donell Beers with our group.  I do not think we have seen him since.  Noted recurrent drainage in his right buttock with pain and swelling.  Went to urgent care 2 days ago.  Felt to be a fistula.  Recommend considering follow-up in the ER.  I do not think our group was called.  Patient felt it pop & start draining yesterday & was concerned, therefore came to [facility] emergency department.  Examination consistent with draining sinus fistula with some mild cellulitis and CT scan notes no drainable abscess.  Surgical Advice requested.  Patient is on metformin and insulin and followed by internal medicine.  A1c is over 9 today.  Glucose is over 300.  He does smoke.  Goes for a case of beer a week.  Has some hypertension and hypercholesterolemia.  Looks like he has had abscesses on his left buttock drain October 2016, another abscess at the gluteal cleft in the ER March 2017.  Recurrent natal cleft drainage July 2017.  Left gluteal abscess drained September 2019.  Right perirectal abscess drained by Dr. Donell Beers with our group in December 2021.  Right gluteal abscess drained in the ER  August 2022. Had a tooth abscess requiring ed drainage in 2015.  I believe that is when he was diagnosed with diabetes.     Assessment  Brandon Mcneil  42 y.o. male       Problem List:  Principal Problem:   Poorly controlled diabetes mellitus (HCC) Active Problems:   Bipolar I disorder, most recent episode depressed (HCC)   Tobacco use disorder   Essential hypertension   Lumbar foraminal stenosis   Hyperglycemia   Perirectal fistula   Depression   Hypercholesteremia   Severe obesity (BMI 35.0-39.9) with comorbidity (HCC)   Probable recurrent spontaneously draining perirectal abscess with fistula formation.  Poorly controlled diabetes.  Smoker.  Plan:  There is no specific abscess to drain at this time.  Reasonable to cover with antibiotics to keep cellulitis to control in the setting of uncontrolled diabetes  Agree with giving some IV antibiotics.  Usually we do Zosyn with diabetes but given a legitimate penicillin allergy of a would do cefepime/metronidazole.    At discharge, can transition to oral Bactrim DS twice daily and Flagyl 500 p.o. twice daily for 10 days.  If there is concern about MRSA, can test it but there is no evidence of that in the past, although admittedly he is higher risk given his numerous ER visits and poorly controlled diabetes.    Patient needs aggressive diabetic control.  Any attempt at anal fistula repair at this time would fail in the setting of severe hyperglycemia, uncontrolled diabetes, smoking.  Strongly recommend get his  diabetes under better control as soon as possible.  Do not know if he needs to be admitted, but I am concerned with his glucoses in the 300s.  Patient does not have a fever, no leukocytosis, no elevated lactate, no shock, no tracking gas or abscess cavities that would raise a concern for Fournier's gangrene; but, I am guarded against just sending him home with his diabetes and not well-controlled.  Defer to  ED/medicine.  Should the patient be admitted, surgery can help follow the hospital to see if something becomes more fluctuant that requires drainage during hospitalization.  Hopefully with more aggressive antibiotics and wound control and diabetic control, the cellulitis along with a draining abscess should calm down nonoperatively.  Once his diabetes can be better improved and treated, then can plan anal fistula repair versus fistulotomy.  May need evaluation in the OR with seton placements and/or MRI.  Has had a lot of abscesses in the region.  Do not know if he is got a pilonidal or horseshoe or complex component to this but looks like there is just 1 region today.  Reasonable to have the patient follow-up with our office in a couple weeks after discharge to discuss with one of the colorectal surgeons (Dr. Elta Guadeloupe)  STOP SMOKING! Tobacco use dramatically increases the risk of peri-operative complications such as infection, tissue necrosis leaving to problems with incision/wound and organ healing, hernia, chronic pain, heart attack, stroke, DVT, pulmonary embolism, and death.  We noted there are programs in our community to help stop smoking.  Information was available.   I reviewed nursing notes, ED provider notes, Consultant surgery notes, last 24 h vitals and pain scores, last 48 h intake and output, last 24 h labs and trends, and last 24 h imaging results. I have reviewed this patient's available data, including medical history, events of note, test results, etc as part of my evaluation.  A significant portion of that time was spent in counseling.  Care during the described time interval was provided by me.  This care required moderate level of medical decision making.  01/06/2023  Ardeth Sportsman, MD, FACS, MASCRS Esophageal, Gastrointestinal & Colorectal Surgery Robotic and Minimally Invasive Surgery  [facility] [address]  [address] [place], [place] [address] [phone] Fax [phone] Main  CONTACT INFORMATION:  Weekday (9AM-5PM): Call [facility] main office at [phone]  Weeknight (5PM-9AM) or Weekend/Holiday: Check [email or web address] (password " [email or web address]") for General Surgery [facility] coverage  (Please, do not use SecureChat as it is not reliable communication to reach operating surgeons for immediate patient care given surgeries/outpatient duties/clinic/cross-coverage/off post-call which would lead to a delay in care.  Epic staff messaging available for outptient concerns, but may not be answered for 48 hours or more).     01/06/2023      Past Medical History:  Diagnosis Date   Abscess, perirectal 08/12/2020   Bipolar I disorder, most recent episode depressed (HCC) 09/11/2013   Depression    Essential hypertension 02/03/2017   Hypercholesteremia    Lumbar foraminal stenosis 01/06/2023   Poorly controlled diabetes mellitus (HCC) 01/06/2023   Tobacco use disorder 09/11/2013    Past Surgical History:  Procedure Laterality Date   INCISION AND DRAINAGE ABSCESS  05/2015   Left buttock   INCISION AND DRAINAGE ABSCESS  10/2015   "Natal cleft" - ED   INCISION AND DRAINAGE ABSCESS  02/2016   "natal cleft"   INCISION AND DRAINAGE ABSCESS  04/2018  left gluteal abscess   INCISION AND DRAINAGE ABSCESS  03/2021   right gluteal   INCISION AND DRAINAGE INTRA ORAL ABSCESS  2015   INCISION AND DRAINAGE PERIRECTAL ABSCESS N/A 08/02/2020   Procedure: IRRIGATION AND DEBRIDEMENT PERIRECTAL ABSCESS;  Surgeon: Almond Lint, MD;  Location: [facility]S;  Service: General;  Laterality: N/A;    Social History   Socioeconomic History   Marital status: Single    Spouse name: Not on file   Number of children: 3   Years of education: 10th   Highest education level: Not on file  Occupational History   Occupation: unemployed  Tobacco Use   Smoking status: Every Day    Packs/day: 0.50    Years: 14.00    Additional  pack years: 0.00    Total pack years: 7.00    Types: Cigarettes   Smokeless tobacco: Never   Tobacco comments:    1/2 pack a day  Vaping Use   Vaping Use: Never used  Substance and Sexual Activity   Alcohol use: Yes    Alcohol/week: 14.0 standard drinks of alcohol    Types: 14 Cans of beer per week    Comment: 2 a day   Drug use: No    Types: Marijuana   Sexual activity: Yes    Partners: Female    Birth control/protection: None  Other Topics Concern   Not on file  Social History Narrative   Originally from [place]   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   Social Determinants of Health   Financial Resource Strain: Not on file  Food Insecurity: Not on file  Transportation Needs: Not on file  Physical Activity: Not on file  Stress: Not on file  Social Connections: Not on file  Intimate Partner Violence: Not on file    Family History  Problem Relation Age of Onset   Healthy Mother    Healthy Father     Current Facility-Administered Medications  Medication Dose Route Frequency Provider Last Rate Last Admin   acetaminophen (TYLENOL) tablet 1,000 mg  1,000 mg Oral Q6H Karie Soda, MD       lactated ringers bolus 1,000 mL  1,000 mL Intravenous Once Karie Soda, MD       lactated ringers bolus 1,000 mL  1,000 mL Intravenous Q8H PRN Demorio Seeley, Viviann Spare, MD       methocarbamol (ROBAXIN) 1,000 mg in dextrose 5 % 100 mL IVPB  1,000 mg Intravenous Q6H PRN Karie Soda, MD       methocarbamol (ROBAXIN) tablet 1,000 mg  1,000 mg Oral Q6H PRN Karie Soda, MD       oxyCODONE (Oxy IR/ROXICODONE) immediate release tablet 5-10 mg  5-10 mg Oral Q4H PRN Karie Soda, MD       vancomycin (VANCOCIN) IVPB 1000 mg/200 mL premix  1,000 mg Intravenous Once Small, Brooke L, PA       witch hazel-glycerin (TUCKS) pad   Topical PRN Karie Soda, MD       Current Outpatient Medications  Medication Sig Dispense Refill    metroNIDAZOLE (FLAGYL) 500 MG tablet Take 1 tablet (500 mg total) by mouth 3 (three) times daily. 30 tablet 1   sulfamethoxazole-trimethoprim (BACTRIM DS) 800-160 MG tablet Take 1 tablet by mouth 2 (two) times daily. 20 tablet 1   acetaminophen (TYLENOL) 325 MG tablet Take 2 tablets (650 mg total) by mouth in the morning and at bedtime.  60 tablet 0   amLODipine (NORVASC) 10 MG tablet Take 1 tablet (10 mg total) by mouth daily. 90 tablet 1   aspirin 81 MG EC tablet Take 81 mg by mouth daily.     blood glucose meter kit and supplies KIT Dispense based on patient and insurance preference. Use up to four times daily as directed. (FOR ICD-9 250.00, 250.01). 1 each 0   Blood Glucose Monitoring Suppl (TRUE METRIX METER) w/Device KIT Use to check blood sugar three times daily. 1 kit 0   glucose blood (TRUE METRIX BLOOD GLUCOSE TEST) test strip Use to check blood sugar three times daily. 100 each 2   Insulin Glargine (BASAGLAR KWIKPEN) 100 UNIT/ML Inject 20 Units into the skin daily. 6 mL 2   Insulin Pen Needle (TRUEPLUS 5-BEVEL PEN NEEDLES) 32G X 4 MM MISC Use to inject Basaglar once daily. 100 each 2   metFORMIN (GLUCOPHAGE-XR) 500 MG 24 hr tablet Take 1 tablet (500 mg total) by mouth 2 (two) times daily with a meal. 60 tablet 2   pravastatin (PRAVACHOL) 20 MG tablet Take 1 tablet every other day at bedtime (Patient not taking: Reported on 01/04/2023) 90 tablet 1   TRUEplus Lancets 28G MISC Use to check blood sugar three times daily. 100 each 2     Allergies  Allergen Reactions   Lisinopril Swelling and Other (See Comments)    Lips and mouth became swollen   Penicillins Itching, Swelling and Rash     BP 124/74   Pulse 66   Temp 98.4 F (36.9 C) (Oral)   Resp 18   Ht 5\' 8"  (1.727 m)   Wt 104.4 kg   SpO2 98%   BMI 35.00 kg/m     Results:   Labs: Results for orders placed or performed during the hospital encounter of 01/06/23 (from the past 48 hour(s))  CBC with Differential     Status:  None   Collection Time: 01/06/23  3:40 PM  Result Value Ref Range   WBC 6.6 4.0 - 10.5 K/uL   RBC 4.70 4.22 - 5.81 MIL/uL   Hemoglobin 14.0 13.0 - 17.0 g/dL   HCT 09.8 11.9 - 14.7 %   MCV 83.0 80.0 - 100.0 fL   MCH 29.8 26.0 - 34.0 pg   MCHC 35.9 30.0 - 36.0 g/dL   RDW 82.9 56.2 - 13.0 %   Platelets 260 150 - 400 K/uL   nRBC 0.0 0.0 - 0.2 %   Neutrophils Relative % 61 %   Neutro Abs 4.0 1.7 - 7.7 K/uL   Lymphocytes Relative 24 %   Lymphs Abs 1.6 0.7 - 4.0 K/uL   Monocytes Relative 13 %   Monocytes Absolute 0.9 0.1 - 1.0 K/uL   Eosinophils Relative 2 %   Eosinophils Absolute 0.1 0.0 - 0.5 K/uL   Basophils Relative 0 %   Basophils Absolute 0.0 0.0 - 0.1 K/uL   Immature Granulocytes 0 %   Abs Immature Granulocytes 0.02 0.00 - 0.07 K/uL    Comment: Performed at [facility], [address], [place], [place] [address]  Comprehensive metabolic panel     Status: Abnormal   Collection Time: 01/06/23  3:40 PM  Result Value Ref Range   Sodium 135 135 - 145 mmol/L   Potassium 4.5 3.5 - 5.1 mmol/L   Chloride 104 98 - 111 mmol/L   CO2 22 22 - 32 mmol/L   Glucose, Bld 305 (H) 70 - 99 mg/dL  Comment: Glucose reference range applies only to samples taken after fasting for at least 8 hours.   BUN 13 6 - 20 mg/dL   Creatinine, Ser 1.61 0.61 - 1.24 mg/dL   Calcium 8.8 (L) 8.9 - 10.3 mg/dL   Total Protein 8.1 6.5 - 8.1 g/dL   Albumin 3.5 3.5 - 5.0 g/dL   AST 18 15 - 41 U/L   ALT 18 0 - 44 U/L   Alkaline Phosphatase 101 38 - 126 U/L   Total Bilirubin 0.5 0.3 - 1.2 mg/dL   GFR, Estimated >09 >60 mL/min    Comment: (NOTE) Calculated using the CKD-EPI Creatinine Equation (2021)    Anion gap 9 5 - 15    Comment: Performed at [facility], [address] [place], [place] [address]  Lactic acid, plasma     Status: None   Collection Time: 01/06/23  3:40 PM  Result Value Ref Range   Lactic Acid, Venous 1.6 0.5 - 1.9 mmol/L    Comment: Performed at  [facility], [address] [place], [place] [address]    Imaging / Studies: CT ABDOMEN PELVIS W CONTRAST  Result Date: 01/06/2023 CLINICAL DATA:  Right buttock abscess EXAM: CT ABDOMEN AND PELVIS WITH CONTRAST TECHNIQUE: Multidetector CT imaging of the abdomen and pelvis was performed using the standard protocol following bolus administration of intravenous contrast. RADIATION DOSE REDUCTION: This exam was performed according to the departmental dose-optimization program which includes automated exposure control, adjustment of the mA and/or kV according to patient size and/or use of iterative reconstruction technique. CONTRAST:  OMNIPAQUE IOHEXOL 300 MG/ML  SOLN COMPARISON:  08/02/2020 and 10/22/2019 FINDINGS: Lower chest: Mild lingular scarring or subsegmental atelectasis. Hepatobiliary: Contracted gallbladder.  Otherwise unremarkable. Pancreas: Unremarkable Spleen: Unremarkable Adrenals/Urinary Tract: Mild fullness of both adrenal glands without discrete mass. The kidneys and urinary bladder appear normal. Stomach/Bowel: Unremarkable.  Normal appendix. Vascular/Lymphatic: Prominent right inguinal lymph node 1.9 cm in short axis on image 81 series 2, formerly 1.7 cm on 08/02/2020. A left inguinal lymph node measures 1.1 cm in short axis on image 81 series 2, formerly 1.0 cm. No significant atheromatous vascular calcifications observed. Reproductive: Unremarkable Other: In the right buttock along medial side of the gluteal sulcus, there is abnormal bandlike extension of density extending from the external sphincter/lower perianal region into the lower margin of the buttock. This extends along an approximately 5.9 cm vertical excursion and measures up to 1.3 by 4.3 cm in cross-section. Equivocal central hypodensity in portions of this process for example image 96 series 2. Moderate amount of inflammatory stranding in the surrounding subcutaneous tissues. Looking back at prior exams,  the patient has a history of similar findings in this region, including a larger drainable abscess in the region on 08/02/2020, for which he underwent incision and drainage on 08/02/2020. The persistence and potential recurrence of this process raises the possibility of otherwise occult perianal fistula as a cause for recurrent inflammation/infection. Although extension through the external sphincter is not well seen on today's CT, such extension is often times much better depicted by MRI. Perianal fistula protocol MRI is suggested for further characterization and for potential surgical planning. 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 IMPRESSION: 1. Abnormal bandlike extension of density from the external sphincter/lower perianal region into the lower margin of the right buttock with surrounding inflammatory stranding, in history of prior abscess incision and drainage in this vicinity in December 2021. The  persistence and potential recurrence of this process raises the possibility of otherwise occult perianal fistula as a cause for recurrent inflammation/infection. Follow up perianal fistula protocol MRI with and without contrast is suggested for further characterization and potential surgical planning. 2. Mild bilateral inguinal adenopathy, probably reactive. 3. 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 Electronically Signed   By: Gaylyn Rong M.D.   On: 01/06/2023 16:38   DG Toe Great Left  Result Date: 01/06/2023 CLINICAL DATA:  Diabetes, discoloration left great toenail EXAM: LEFT GREAT TOE COMPARISON:  None Available. FINDINGS: Frontal, oblique, and lateral views of the left great toe are obtained. There are no acute or destructive bony abnormalities. Alignment is anatomic. Joint spaces are well preserved. Mild diffuse soft tissue edema. No subcutaneous gas or radiopaque foreign body. IMPRESSION: 1. Soft tissue  swelling.  No fracture or radiopaque foreign body. Electronically Signed   By: Sharlet Salina M.D.   On: 01/06/2023 16:15    Medications / Allergies: per chart  Antibiotics: Anti-infectives (From admission, onward)    Start     Dose/Rate Route Frequency Ordered Stop   01/06/23 1545  clindamycin (CLEOCIN) IVPB 600 mg        600 mg 100 mL/hr over 30 Minutes Intravenous  Once 01/06/23 1531 01/06/23 1648   01/06/23 1545  vancomycin (VANCOCIN) IVPB 1000 mg/200 mL premix        1,000 mg 200 mL/hr over 60 Minutes Intravenous  Once 01/06/23 1531     01/06/23 1545  meropenem (MERREM) 1 g in sodium chloride 0.9 % 100 mL IVPB        1 g 200 mL/hr over 30 Minutes Intravenous  Once 01/06/23 1536 01/06/23 1702   01/06/23 0000  sulfamethoxazole-trimethoprim (BACTRIM DS) 800-160 MG tablet        1 tablet Oral 2 times daily 01/06/23 1753     01/06/23 0000  metroNIDAZOLE (FLAGYL) 500 MG tablet        500 mg Oral 3 times daily 01/06/23 1753           Note: Portions of this report may have been transcribed using voice recognition software. Every effort was made to ensure accuracy; however, inadvertent computerized transcription errors may be present.   Any transcriptional errors that result from this process are unintentional.    Ardeth Sportsman, MD, FACS, MASCRS Esophageal, Gastrointestinal & Colorectal Surgery Robotic and Minimally Invasive Surgery  [facility] [address] [place], [place] [address] [phone] Fax [phone] Main  CONTACT INFORMATION:  Weekday (9AM-5PM): Call [facility] main office at [phone]  Weeknight (5PM-9AM) or Weekend/Holiday: Check [email or web address] (password " [email or web address]") for General Surgery [facility] coverage  (Please, do not use SecureChat as it is not reliable communication to reach operating surgeons for immediate patient care given surgeries/outpatient duties/clinic/cross-coverage/off post-call which  would lead to a delay in care.  Epic staff messaging available for outptient concerns, but may not be answered for 48 hours or more).      01/06/2023  6:14 PM

## 2023-01-06 NOTE — ED Provider Notes (Signed)
[place] [facility] AT [facility] Provider Note   CSN: [id number] Arrival date & time: 01/06/23  1353     History  Chief Complaint  Patient presents with   Abscess   Nail Problem    Black    Brandon Mcneil is a 42 y.o. male, history of diabetes, who presents to the ED secondary to lesion on his right buttocks it has been going on for the last 5 days.  He states that is been getting bigger, went to urgent care about 2 days ago, and was told to come to the ER, but it popped yesterday.  States that he has had drainage, after sitting in a hot bath, but it is getting larger.  Denies any fever, chills.  Also complaint of left toenail that is black.  States he was clipping his nails about a week ago, and it has been black since.  Denies any kind of tenderness, redness, swelling of the area.  Also reports swelling to lateral lower incisor on L side of mouth. Does not currently have a dentist. Denies facial swelling, fever.   Home Medications Prior to Admission medications   Medication Sig Start Date End Date Taking? Authorizing Provider  metroNIDAZOLE (FLAGYL) 500 MG tablet Take 1 tablet (500 mg total) by mouth 3 (three) times daily. 01/06/23  Yes Karie Soda, MD  sulfamethoxazole-trimethoprim (BACTRIM DS) 800-160 MG tablet Take 1 tablet by mouth 2 (two) times daily. 01/06/23  Yes Karie Soda, MD  acetaminophen (TYLENOL) 325 MG tablet Take 2 tablets (650 mg total) by mouth in the morning and at bedtime. 12/26/21   Carlisle Beers, FNP  amLODipine (NORVASC) 10 MG tablet Take 1 tablet (10 mg total) by mouth daily. 07/23/22   Grayce Sessions, NP  aspirin 81 MG EC tablet Take 81 mg by mouth daily.    [provider]  blood glucose meter kit and supplies KIT Dispense based on patient and insurance preference. Use up to four times daily as directed. (FOR ICD-9 250.00, 250.01). 03/13/20   Mardella Layman, MD  Blood Glucose Monitoring Suppl (TRUE METRIX  METER) w/Device KIT Use to check blood sugar three times daily. 08/21/22   Hoy Register, MD  glucose blood (TRUE METRIX BLOOD GLUCOSE TEST) test strip Use to check blood sugar three times daily. 08/21/22   Hoy Register, MD  Insulin Glargine (BASAGLAR KWIKPEN) 100 UNIT/ML Inject 20 Units into the skin daily. 08/21/22   Hoy Register, MD  Insulin Pen Needle (TRUEPLUS 5-BEVEL PEN NEEDLES) 32G X 4 MM MISC Use to inject Basaglar once daily. 08/21/22   Hoy Register, MD  metFORMIN (GLUCOPHAGE-XR) 500 MG 24 hr tablet Take 1 tablet (500 mg total) by mouth 2 (two) times daily with a meal. 08/21/22   Hoy Register, MD  pravastatin (PRAVACHOL) 20 MG tablet Take 1 tablet every other day at bedtime Patient not taking: Reported on 01/04/2023 07/26/22   Grayce Sessions, NP  TRUEplus Lancets 28G MISC Use to check blood sugar three times daily. 08/21/22   Hoy Register, MD      Allergies    Lisinopril and Penicillins    Review of Systems   Review of Systems  Constitutional:  Negative for fever.  Skin:  Positive for wound.    Physical Exam Updated Vital Signs BP (!) 143/89 (BP Location: Right Arm)   Pulse 65   Temp 98.2 F (36.8 C) (Oral)   Resp 18   Ht 5\' 8"  (1.727 m)  Wt 104.4 kg   SpO2 96%   BMI 35.00 kg/m  Physical Exam Vitals and nursing note reviewed.  Constitutional:      General: He is not in acute distress.    Appearance: He is well-developed.  HENT:     Head: Normocephalic and atraumatic.     Mouth/Throat:     Comments: +edema near left lower lateral incisor w/o fluctuance Eyes:     Conjunctiva/sclera: Conjunctivae normal.  Cardiovascular:     Rate and Rhythm: Normal rate and regular rhythm.     Heart sounds: No murmur heard. Pulmonary:     Effort: Pulmonary effort is normal. No respiratory distress.     Breath sounds: Normal breath sounds.  Abdominal:     Palpations: Abdomen is soft.     Tenderness: There is no abdominal tenderness.  Genitourinary:    Comments:  Chaperone present.  Fluctuance, induration to area of right buttocks, near intergluteal cleft, not at the anus.  Additionally induration inferior and to the right of anus Musculoskeletal:        General: No swelling.     Cervical back: Neck supple.  Skin:    General: Skin is warm and dry.     Capillary Refill: Capillary refill takes less than 2 seconds.     Comments: +L great nail that is black. No overlying pallor or erythema of toe. No necrosis of tissue other than at area of nail.   Neurological:     Mental Status: He is alert.  Psychiatric:        Mood and Affect: Mood normal.     ED Results / Procedures / Treatments   Labs (all labs ordered are listed, but only abnormal results are displayed) Labs Reviewed  COMPREHENSIVE METABOLIC PANEL - Abnormal; Notable for the following components:      Result Value   Glucose, Bld 305 (*)    Calcium 8.8 (*)    All other components within normal limits  CBG MONITORING, ED - Abnormal; Notable for the following components:   Glucose-Capillary 207 (*)    All other components within normal limits  MRSA NEXT GEN BY PCR, NASAL  CBC WITH DIFFERENTIAL/PLATELET  LACTIC ACID, PLASMA  LACTIC ACID, PLASMA  CBC    EKG None  Radiology CT ABDOMEN PELVIS W CONTRAST  Result Date: 01/06/2023 CLINICAL DATA:  Right buttock abscess EXAM: CT ABDOMEN AND PELVIS WITH CONTRAST TECHNIQUE: Multidetector CT imaging of the abdomen and pelvis was performed using the standard protocol following bolus administration of intravenous contrast. RADIATION DOSE REDUCTION: This exam was performed according to the departmental dose-optimization program which includes automated exposure control, adjustment of the mA and/or kV according to patient size and/or use of iterative reconstruction technique. CONTRAST:  OMNIPAQUE IOHEXOL 300 MG/ML  SOLN COMPARISON:  08/02/2020 and 10/22/2019 FINDINGS: Lower chest: Mild lingular scarring or subsegmental atelectasis.  Hepatobiliary: Contracted gallbladder.  Otherwise unremarkable. Pancreas: Unremarkable Spleen: Unremarkable Adrenals/Urinary Tract: Mild fullness of both adrenal glands without discrete mass. The kidneys and urinary bladder appear normal. Stomach/Bowel: Unremarkable.  Normal appendix. Vascular/Lymphatic: Prominent right inguinal lymph node 1.9 cm in short axis on image 81 series 2, formerly 1.7 cm on 08/02/2020. A left inguinal lymph node measures 1.1 cm in short axis on image 81 series 2, formerly 1.0 cm. No significant atheromatous vascular calcifications observed. Reproductive: Unremarkable Other: In the right buttock along medial side of the gluteal sulcus, there is abnormal bandlike extension of density extending from the external sphincter/lower perianal region into  the lower margin of the buttock. This extends along an approximately 5.9 cm vertical excursion and measures up to 1.3 by 4.3 cm in cross-section. Equivocal central hypodensity in portions of this process for example image 96 series 2. Moderate amount of inflammatory stranding in the surrounding subcutaneous tissues. Looking back at prior exams, the patient has a history of similar findings in this region, including a larger drainable abscess in the region on 08/02/2020, for which he underwent incision and drainage on 08/02/2020. The persistence and potential recurrence of this process raises the possibility of otherwise occult perianal fistula as a cause for recurrent inflammation/infection. Although extension through the external sphincter is not well seen on today's CT, such extension is often times much better depicted by MRI. Perianal fistula protocol MRI is suggested for further characterization and for potential surgical planning. 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 IMPRESSION: 1. Abnormal bandlike extension of density from the external sphincter/lower perianal region into the lower  margin of the right buttock with surrounding inflammatory stranding, in history of prior abscess incision and drainage in this vicinity in December 2021. The persistence and potential recurrence of this process raises the possibility of otherwise occult perianal fistula as a cause for recurrent inflammation/infection. Follow up perianal fistula protocol MRI with and without contrast is suggested for further characterization and potential surgical planning. 2. Mild bilateral inguinal adenopathy, probably reactive. 3. 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 Electronically Signed   By: Gaylyn Rong M.D.   On: 01/06/2023 16:38   DG Toe Great Left  Result Date: 01/06/2023 CLINICAL DATA:  Diabetes, discoloration left great toenail EXAM: LEFT GREAT TOE COMPARISON:  None Available. FINDINGS: Frontal, oblique, and lateral views of the left great toe are obtained. There are no acute or destructive bony abnormalities. Alignment is anatomic. Joint spaces are well preserved. Mild diffuse soft tissue edema. No subcutaneous gas or radiopaque foreign body. IMPRESSION: 1. Soft tissue swelling.  No fracture or radiopaque foreign body. Electronically Signed   By: Sharlet Salina M.D.   On: 01/06/2023 16:15    Procedures Procedures    Medications Ordered in ED Medications  lactated ringers bolus 1,000 mL (has no administration in time range)  oxyCODONE (Oxy IR/ROXICODONE) immediate release tablet 5-10 mg (has no administration in time range)  acetaminophen (TYLENOL) tablet 1,000 mg (1,000 mg Oral Given 01/06/23 1839)  methocarbamol (ROBAXIN) tablet 1,000 mg (has no administration in time range)  methocarbamol (ROBAXIN) 1,000 mg in dextrose 5 % 100 mL IVPB (has no administration in time range)  witch hazel-glycerin (TUCKS) pad (has no administration in time range)  insulin aspart (novoLOG) injection 0-15 Units (5 Units Subcutaneous Given 01/06/23 1858)  clindamycin (CLEOCIN) IVPB  600 mg (0 mg Intravenous Stopped 01/06/23 1648)  vancomycin (VANCOCIN) IVPB 1000 mg/200 mL premix (0 mg Intravenous Stopped 01/06/23 2000)  meropenem (MERREM) 1 g in sodium chloride 0.9 % 100 mL IVPB (0 g Intravenous Stopped 01/06/23 1836)  iohexol (OMNIPAQUE) 300 MG/ML solution 100 mL (100 mLs Intravenous Contrast Given 01/06/23 1618)  fentaNYL (SUBLIMAZE) injection 50 mcg (50 mcg Intravenous Given 01/06/23 1643)  lactated ringers bolus 1,000 mL (0 mLs Intravenous Stopped 01/06/23 2000)    ED Course/ Medical Decision Making/ A&P                             Medical Decision Making Patient is a 42 year old male,  here for multiple issues.  His first and foremost issue is of his buttocks and the swelling around his rectum.  Given that he is uncontrolled diabetic, we will obtain a CAT scan, and start him on aggressive IV antibiotic treatment in case it turns out to be necrotizing fasciitis.  Additionally we evaluated for his tooth infection, does not appear to be infected, but he declined any kind of possible incision and drainage of possible abscess, thus we will put him on antibiotics for this.  Additionally he has complained of toe pain, and black toenail that is been going on for the last couple weeks, he does not have a podiatrist.  Will obtain x-ray to evaluate for any kind of bony damage.  Amount and/or Complexity of Data Reviewed Labs: ordered.    Details: Glucose of 305, no evidence of leukocytosis Radiology: ordered.    Details: X-ray of toe shows soft tissue swelling, no bony destruction.  Additionally CT shows perianal fistula, with inflammation.  Mild inguinal lymphadenopathy no subcu gas. Discussion of management or test interpretation with external provider(s): Discussed with patient, he declined a incision drainage of tooth infection, will need to be to treated with antibiotics follow-up with dentist.  Additionally discussed over toe infection, does not have any kind of bone destruction on  x-ray, will need to follow-up with podiatry.  His biggest issue is the fistula perianal, I discussed with Dr. Okey Dupre, and evaluated patient, he is hesitant to recommend discharge given the uncontrolled diabetes, and infection.  Given that patient is uncontrolled diabetic, with increased risk of worsening given his immunosuppressed status, we will admit to hospitalist, for IV antibiotics, and further general surgery reeval.  Admitted by Dr. Maryjean Ka  Risk Prescription drug management. Decision regarding hospitalization.    Final Clinical Impression(s) / ED Diagnoses Final diagnoses:  Fistula, perirectal  Tooth infection  Toe infection    Rx / DC Orders ED Discharge Orders          Ordered    sulfamethoxazole-trimethoprim (BACTRIM DS) 800-160 MG tablet  2 times daily        01/06/23 1753    metroNIDAZOLE (FLAGYL) 500 MG tablet  3 times daily        01/06/23 1753              Aalijah Lanphere, Harley Alto, PA 01/06/23 2038    Lonell Grandchild, MD 01/06/23 2139

## 2023-01-06 NOTE — ED Triage Notes (Signed)
Pt reports  Abscess Right buttocks Per UC come to ER due to it being tow close to rectum It popped yesterday Has been draining Manson Passey and blood Black toenail Left great toe

## 2023-01-06 NOTE — Discharge Instructions (Addendum)
Anal Abscess/Fistula  What is an anal abscess? An anal abscess is an infected cavity filled with pus found near the anus or rectum.  What is an anal fistula? An anal fistula (also called fistula-in-ano) is frequently the result of a previous or current anal abscess, occurring in up to 50% of patients with abscesses. Normal anatomy includes Fatou Dunnigan glands just inside the anus. Occasionally, these glands get clogged and potentially can become infected, leading to an abscess. The fistula is a tunnel that forms under the skin and connects the infected glands to the abscess. A fistula can be present with or without an abscess and may connect just to the skin of the buttocks near the anal opening. Other situations that can result in a fistula include Crohn's disease, radiation, trauma and malignancy.  How does someone get an anal abscess or a fistula? The abscess is most often a result of an acute infection in the internal glands of the anus. Occasionally, bacteria, fecal material or foreign matter can clog the anal gland and create a condition for an abscess cavity to form. Other medical conditions can make these types of infections more likely.  After an abscess drains on its own or has been drained (opened), a tunnel (fistula) may persist, connecting the infected anal gland to the external skin. This typically will involve some type of drainage from the external opening and occurs in up to 50% of abscesses. If the opening on the skin heals when a fistula is present, a recurrent abscess may develop.  What are the specific signs or symptoms of an abscess or fistula? A patient with an abscess may have pain, redness or swelling in the area around the anal area. Fatigue, general malaise, as well as accompanying fever or chills are also common. Similar signs and symptoms may be present when patients have a fistula, with the addition of possible irritation of the perianal skin or drainage from an external  opening.  Is any specific testing necessary to diagnose an abscess or fistula? No. Most anal abscesses or fistula-in-ano are diagnosed and managed on the basis of clinical findings. Occasionally, additional studies such as ultrasound, CT scan, or MRI can assist with the diagnosis of deeper abscesses or the delineation of the fistula tunnel to help guide treatment.   What is the treatment of an anal abscess? The treatment of an abscess is surgical drainage under most circumstances. An incision is made in the skin near the anus to drain the infection. This can be done in a doctor's office with local anesthetic or in an operating room under deeper anesthesia. Hospitalization may be required for patients prone to more significant infections such as diabetics or patients with decreased immunity.  Are antibiotics required to treat this type of infection? Antibiotics alone are a poor alternative to drainage of the infection. For uncomplicated abscesses, the addition of antibiotics to surgical drainage does not improve healing time or reduce the potential for recurrences. There are some conditions in which antibiotics are indicated, such as for patients with compromised or altered immunity, some cardiac valvular conditions or extensive cellulitis. A comprehensive discussion of your past medical history and a physical exam are important to determine if antibiotics are indicated.  What is the treatment of an anal fistula? Surgery is almost always necessary to cure an anal fistula. Although surgery can be fairly straightforward, it may also be complicated, occasionally requiring staged or multiple operations. Consider identifying a specialist in colon and rectal surgery who would  be familiar with a number of potential operations to treat the fistula. The surgery may be performed at the same time as drainage of an abscess, although sometimes the fistula doesn't appear until weeks to years after the initial  drainage. If the fistula is straightforward, a fistulotomy may be performed. This procedure involves connecting the internal opening within the anal canal to the external opening, creating a groove that will heal from the inside out. This surgery often will require dividing a Jourdon Zimmerle portion of the sphincter muscle which has the unlikely potential for affecting the control of bowel movements in a limited number of cases.  Other procedures include placing material within the fistula tract to occlude it or surgically altering the surrounding tissue to accomplish closure of the fistula, with the choice of procedure depending upon the type, length, and location of the fistula. Most of the operations can be performed on an outpatient basis, but may occasionally require hospitalization.  What is the recovery like from surgery? Pain after surgery is controlled with pain pills, fiber and bulk laxatives. Patients should plan for time at home using sitz baths and attempt to avoid the constipation that can be associated with prescription pain medication. Discuss with your surgeon the specific care and time away from work prior to surgery to prepare yourself for post-operative care.  Can the abscess or fistula recur? If adequately treated and properly healed, both are unlikely to return. However, despite proper and indicated open or minimally invasive treatment, both abscesses and fistulas can potentially recur. Should similar symptoms arise, suggesting recurrence, it is recommended that you find a colon and rectal surgeon to manage your condition.  What is a colon and rectal surgeon? Colon and rectal surgeons are experts in the surgical and non-surgical treatment of diseases of the colon, rectum and anus. They have completed advanced surgical training in the treatment of these diseases as well as full general surgical training. Board-certified colon and rectal surgeons complete residencies in general surgery and colon  and rectal surgery, and pass intensive examinations conducted by the American Board of Surgery and the American Board of Colon and Rectal Surgery. They are well-versed in the treatment of both benign and malignant diseases of the colon, rectum and anus and are able to perform routine screening examinations and surgically treat conditions if indicated to do so.   ##############################################  ANORECTAL ABSCESS/FISTULA  ######################################################################  EAT Start with a pureed / full liquid diet After 24 hours, gradually transition to a high fiber diet.    CONTROL PAIN Control pain so you can tolerate bowel movements,  walk, sleep, tolerate sneezing/coughing, and go up/down stairs.   HAVE A BOWEL MOVEMENT DAILY Keep your bowels regular to avoid problems.   Taking a fiber supplement 2-4 doses every day to keep bowels soft.   HAVE A BM BY THE SECOND POSTOPERATIVE DAY Use an antidairrheal to slow down diarrhea.   Call if not better after 2 tries  WALK Walk an hour a day.  Control your pain to do that.   CALL IF YOU HAVE PROBLEMS/CONCERNS Call if you are still struggling despite following these instructions. Call if you have concerns not answered by these instructions  ######################################################################    Take your usually prescribed home medications unless otherwise directed.  DIET: Follow a light bland diet & liquids the first 24 hours after arrival home, such as soup, liquids, starches, etc.  Be sure to drink plenty of fluids.  Quickly advance to a usual solid diet within  a few days.  Avoid fast food or heavy meals as your are more likely to get nauseated or have irregular bowels.  A low-fat, high-fiber diet for the rest of your life is ideal.  Control pain to avoid problems with urinating or defecating:  Pain is best controlled by a usual combination of many methods TOGETHER: Warm  baths/soaks or Ice packs Over the counter pain medication Prescription pain medications Topical creams  A.  Warm water baths or ice packs (30-60 minutes up to 8 times a day, especially after bowel meovements) will help.  Using ice for the first few days can help decrease swelling and bruising,  Use heat such as warm towels, sitz baths, warm baths, warm showers, etc to help relax tight/sore spots and speed recovery.   Some people prefer to use ice alone, heat alone, alternating between ice & heat.  Experiment to what works for you.    It is helpful to take an over-the-counter pain medication continuously for the first few weeks.  This helps people need to use less narcotics Choose one of the following that works best for you: Naproxen (Aleve, etc)  Two 220mg  tabs twice a day Ibuprofen (Advil, etc) Three 200mg  tabs four times a day (every meal & bedtime) Acetaminophen (Tylenol, etc) 500-650mg  four times a day (every meal & bedtime)  A  prescription for pain medication (such as oxycodone, hydrocodone, etc) should be given to you upon discharge.  Take your pain medication as prescribed.  If you are having problems/concerns with the prescription medicine (does not control pain, nausea, vomiting, rash, itching, etc), please call us [phone] to see if we need to switch you to a different pain medicine that will work better for you and/or control your side effect better. Most people will need 1-2 refills to get through the first couple of weeks when pain is the most intense.  If you need a refill on your pain medication, please contact your pharmacy.  They will contact our office to request authorization. Prescriptions will not be filled after 5 pm or on week-ends.  If can take up to 48 hours for it to be filled & ready so avoid waiting until you are down to thel ast pill.  A numbing topical cream (Dibucaine) may be given to you.  Many people find relief with topical creams.  Some people find it  burns too much.  Experiment.  If it helps, use it.  If it burns, stop using it.  You also may receive a prescription for diazepam, a muscle relaxant to help you to be able to urinate at first easily.  It is safe to take a few doses with the other medications as long as you are not planning to drive or do anything intense.  Hopefully this can minimize the chance of needing a Foley catheter into your bladder     Have a bowel movement every day  Until you have a bowel movement after surgery, he will struggle with urinating and controlling her pain.  Take a fiber supplement (such as Metamucil, Citrucel, FiberCon, MiraLax, etc) 2-4 doses a day to help prevent constipation from occurring.     If you have not had a bowel movement the second postoperative day:  -switch to drinking liquids only -take MiraLAX double dose every 2 hours x 3 or until you have a BM -If that does not work x 3 doses, increase to 4 doses every 2 hours (Like a Tonantzin Mimnaugh bowel prep) until you  have a bowel movement. -Avoid enemas or suppositories (can harm sutures/repair) -Once you start having bowel movements, adjust your fiber supplement or Miralax dosing back down until you are having 1-2 soft well formed BMs a day.  (Start at 2 doses a day & adjust up or down to avoid diarrhea or recurrent constipation)   Watch out for diarrhea.  If you have many loose bowel movements, simplify your diet to bland foods & liquids for a few days.  Stop any stool softeners and decrease your fiber supplement.  Switching to mild anti-diarrheal medications (Kayopectate, Pepto Bismol) can help.  Can try an imodium/loperamide dose.  If this worsens or does not improve, please call us.  Wound Care   a. You have some fluffed cotton on top of the anus to help catch drainage and bleeding.  THERE IS NO PACKING INSIDE THE RECTUM -Let the cotton fall off with the first bowel movement or shower.  It is okay to reinforce or replace as needed.  Bleeding is  common at first and occasionally tapers off after a few weeks   b. Place soft cotton balls on the anus/wounds and use an absorbent pad in your underwear as needed to catch any drainage and help keep the area.  Try to use cotton balls or pads (regular gauze or toilet paper will stick and pull, causing pain.  Cotton will come off more easily).   c. Keep the area clean and dry.  Bathe / shower every day.  Keep the area clean by showering / bathing over the incision / wound.   It is okay to soak an open wound to help wash it.  Consider using a squeeze bottle filled with warm water to gently wash the anal area.  Wet wipes or showers / gentle washing after bowel movements is often less traumatic than regular toilet paper.           D.  Use a Sitz Bath 4-8 times a day for relief  A sitz bath is a warm water bath taken in the sitting position that covers only the hips and buttocks. It may be used for either healing or hygiene purposes. Sitz baths are also used to relieve pain, itching, or muscle spasms.  Gently cleaned the area and the heat will help lower spasm and offer better pain control.    Fill the bathtub half full with warm water. Sit in the water and open the drain a little. Turn on the warm water to keep the tub half full. Keep the water running constantly. Soak in the water for 15 to 20 minutes. After the sitz bath, pat the affected area dry first.   d. You will often notice bleeding, especially with bowel movements.  This should slow down by the end of the first week of surgery.  It can take over a month to more fully stop.  Sitting on an ice pack can help.   e. Expect some drainage.  You often will have some blood or yellow drainage with open wounds.  Sometimes she will get a little leaking of liquid stool until the incision/wounds have fully close down.  This should slow down by the end of the first week of surgery, but you will have occasional bleeding or drainage up to a few months after  surgery until all incisions & wounds have closed.  Wear an absorbent pad or soft cotton gauze in your underwear until the drainage stops.  ACTIVITIES as tolerated:    You  may resume regular (light) daily activities beginning the next day--such as daily self-care, walking, climbing stairs--gradually increasing activities as tolerated.  If you can walk 30 minutes without difficulty, it is safe to try more intense activity such as jogging, treadmill, bicycling, low-impact aerobics, swimming, etc. Save the most intensive and strenuous activity for last such as sit-ups, heavy lifting, contact sports, etc  Refrain from any heavy lifting or straining until you are off narcotics for pain control.   DO NOT PUSH THROUGH PAIN.  Let pain be your guide: If it hurts to do something, don't do it.  Pain is your body warning you to avoid that activity for another week until the pain goes down. You may drive when you are no longer taking prescription pain medication, you can comfortably sit for long periods of time, and you can safely maneuver your car and apply brakes. You may have sexual intercourse when it is comfortable.   6.  FOLLOW UP in our office Please call [facility] at [phone] to set up an appointment to see your surgeon in the office for a follow-up appointment approximately 3 weeks after your surgery. If tissue is sent for pathology, it usually takes a week for pathology results to come back.  We will make an effort to call you with results as soon as we get them.  If you have not heard back in a week and wish to know the results before your postop visit, please call our office and we will see if we can give feedback on the results Make sure that you call for this appointment the day you arrive home to ensure a convenient appointment time.  7. IF YOU HAVE DISABILITY OR FAMILY LEAVE FORMS, BRING THEM TO THE OFFICE FOR PROCESSING.  DO NOT GIVE THEM TO YOUR DOCTOR.        WHEN TO CALL us [phone]  [phone]: Poor pain control Reactions / problems with new medications (rash/itching, nausea, etc)  Fever over 101.5 F (38.5 C) Inability to urinate Nausea and/or vomiting Worsening swelling or bruising Continued bleeding from incision. Increased pain, redness, or drainage from the incision  The clinic staff is available to answer your questions during regular business hours (8:30am-5pm).  Please don't hesitate to call and ask to speak to one of our nurses for clinical concerns.   A surgeon from [facility] is always on call at the hospitals   If you have a medical emergency, go to the nearest emergency room or call 911.    [facility] [address], [place], [place]  [address] ? MAIN: [phone] ? TOLL FREE: [phone] ? FAX [phone] [email or web address]  #####################################################

## 2023-01-06 NOTE — Assessment & Plan Note (Addendum)
On the right side of gluteal cleft . Symptomatic for 4 days, acutally sefl drained at home today per patietn. On exam, distal to anus with no fluctuation,no tendernss and no discharge eliciet. Area ulceration to sub epithelial tissue is sub cm, with induration along the gluteal cleft. Will treat with iv abx with covereage for anerobes.  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 Will get mri pelvis.

## 2023-01-06 NOTE — ED Notes (Signed)
Pt transported to CT ?

## 2023-01-07 ENCOUNTER — Other Ambulatory Visit: Payer: Self-pay

## 2023-01-07 ENCOUNTER — Encounter (HOSPITAL_COMMUNITY): Payer: Self-pay | Admitting: Internal Medicine

## 2023-01-07 DIAGNOSIS — E1165 Type 2 diabetes mellitus with hyperglycemia: Secondary | ICD-10-CM | POA: Diagnosis not present

## 2023-01-07 DIAGNOSIS — K604 Rectal fistula: Secondary | ICD-10-CM | POA: Diagnosis not present

## 2023-01-07 DIAGNOSIS — K610 Anal abscess: Secondary | ICD-10-CM | POA: Diagnosis not present

## 2023-01-07 LAB — CBC
HCT: 37.3 % — ABNORMAL LOW (ref 39.0–52.0)
HCT: 36.4 % — ABNORMAL LOW (ref 39.0–52.0)
Hemoglobin: 13.2 g/dL (ref 13.0–17.0)
Hemoglobin: 13.2 g/dL (ref 13.0–17.0)
MCH: 29.4 pg (ref 26.0–34.0)
MCH: 29.8 pg (ref 26.0–34.0)
MCHC: 35.4 g/dL (ref 30.0–36.0)
MCHC: 36.3 g/dL — ABNORMAL HIGH (ref 30.0–36.0)
MCV: 83.1 fL (ref 80.0–100.0)
MCV: 82.2 fL (ref 80.0–100.0)
Platelets: 263 10*3/uL (ref 150–400)
Platelets: 248 10*3/uL (ref 150–400)
RBC: 4.49 MIL/uL (ref 4.22–5.81)
RBC: 4.43 MIL/uL (ref 4.22–5.81)
RDW: 12.8 % (ref 11.5–15.5)
RDW: 12.8 % (ref 11.5–15.5)
WBC: 6 10*3/uL (ref 4.0–10.5)
WBC: 5.8 10*3/uL (ref 4.0–10.5)
nRBC: 0 % (ref 0.0–0.2)
nRBC: 0 % (ref 0.0–0.2)

## 2023-01-07 LAB — CREATININE, SERUM
Creatinine, Ser: 0.86 mg/dL (ref 0.61–1.24)
GFR, Estimated: >60 mL/min (ref >60)

## 2023-01-07 LAB — APTT: aPTT: 28 seconds (ref 24–36)

## 2023-01-07 LAB — HIV ANTIBODY (ROUTINE TESTING W REFLEX): HIV Screen 4th Generation wRfx: NONREACTIVE

## 2023-01-07 LAB — BASIC METABOLIC PANEL
Anion gap: 8 (ref 5–15)
BUN: 11 mg/dL (ref 6–20)
CO2: 23 mmol/L (ref 22–32)
Calcium: 8.7 mg/dL — ABNORMAL LOW (ref 8.9–10.3)
Chloride: 103 mmol/L (ref 98–111)
Creatinine, Ser: 0.8 mg/dL (ref 0.61–1.24)
GFR, Estimated: >60 mL/min (ref >60)
Glucose, Bld: 188 mg/dL — ABNORMAL HIGH (ref 70–99)
Potassium: 3.4 mmol/L — ABNORMAL LOW (ref 3.5–5.1)
Sodium: 134 mmol/L — ABNORMAL LOW (ref 135–145)

## 2023-01-07 LAB — CBG MONITORING, ED
Glucose-Capillary: 154 mg/dL — ABNORMAL HIGH (ref 70–99)
Glucose-Capillary: 158 mg/dL — ABNORMAL HIGH (ref 70–99)

## 2023-01-07 LAB — PROTIME-INR
INR: 1.1 (ref 0.8–1.2)
Prothrombin Time: 14.6 seconds (ref 11.4–15.2)

## 2023-01-07 LAB — GLUCOSE, CAPILLARY: Glucose-Capillary: 220 mg/dL — ABNORMAL HIGH (ref 70–99)

## 2023-01-07 LAB — MRSA NEXT GEN BY PCR, NASAL: MRSA by PCR Next Gen: NOT DETECTED

## 2023-01-07 MED ORDER — SENNOSIDES-DOCUSATE SODIUM 8.6-50 MG PO TABS
1.0000 | ORAL_TABLET | Freq: Two times a day (BID) | ORAL | 0 refills | Status: AC
  Filled 2023-01-07: qty 60, 30d supply, fill #0

## 2023-01-07 MED ORDER — PRAVASTATIN SODIUM 20 MG PO TABS
20.0000 mg | ORAL_TABLET | ORAL | 1 refills | Status: DC
  Filled 2023-01-07: qty 15, 30d supply, fill #0

## 2023-01-07 MED ORDER — VANCOMYCIN HCL 2000 MG/400ML IV SOLN
2000.0000 mg | Freq: Once | INTRAVENOUS | Status: AC
  Administered 2023-01-07: 2000 mg via INTRAVENOUS
  Filled 2023-01-07: qty 400

## 2023-01-07 MED ORDER — HYDROCORTISONE 0.5 % EX CREA
1.0000 | TOPICAL_CREAM | Freq: Two times a day (BID) | CUTANEOUS | 0 refills | Status: AC
  Filled 2023-01-07: qty 30, 15d supply, fill #0

## 2023-01-07 MED ORDER — WITCH HAZEL-GLYCERIN EX PADS
MEDICATED_PAD | CUTANEOUS | 0 refills | Status: AC | PRN
  Filled 2023-01-07: qty 40, fill #0

## 2023-01-07 MED ORDER — POTASSIUM CHLORIDE CRYS ER 20 MEQ PO TBCR
40.0000 meq | EXTENDED_RELEASE_TABLET | Freq: Once | ORAL | Status: AC
  Administered 2023-01-07: 40 meq via ORAL
  Filled 2023-01-07: qty 2

## 2023-01-07 MED ORDER — METFORMIN HCL ER 500 MG PO TB24
500.0000 mg | ORAL_TABLET | Freq: Two times a day (BID) | ORAL | 0 refills | Status: DC
Start: 2023-01-07 — End: 2023-02-15
  Filled 2023-01-07: qty 60, 30d supply, fill #0

## 2023-01-07 MED ORDER — POLYETHYLENE GLYCOL 3350 17 G PO PACK
17.0000 g | PACK | Freq: Two times a day (BID) | ORAL | 0 refills | Status: AC
  Filled 2023-01-07: qty 14, 7d supply, fill #0

## 2023-01-07 MED ORDER — BASAGLAR KWIKPEN 100 UNIT/ML ~~LOC~~ SOPN
20.0000 [IU] | PEN_INJECTOR | Freq: Every day | SUBCUTANEOUS | 2 refills | Status: DC
Start: 2023-01-07 — End: 2023-02-15
  Filled 2023-01-07: qty 6, 30d supply, fill #0

## 2023-01-07 MED ORDER — VANCOMYCIN HCL 1250 MG/250ML IV SOLN: 1250.0000 mg | Freq: Two times a day (BID) | INTRAVENOUS | Status: DC

## 2023-01-07 NOTE — Progress Notes (Signed)
Pharmacy Antibiotic Note  Brandon Mcneil is a 42 y.o. male admitted on 01/06/2023 with cellulitis and draining perirectal fistula .  Pharmacy has been consulted for Vancomycin dosing.  PCN allergy: Anaphylaxis, Itching, Swelling, Rash  Plan: Meropenem per MD dosing Vancomycin 2g x1 then 1250mg  IV q12h (SCr 0.8, Vd 0.5, AUC 483) Measure Vanc levels as needed.  Goal AUC = 400 - 550  Follow up renal function, culture results, and clinical course.   Height: 5\' 8"  (172.7 cm) Weight: 104.4 kg (230 lb 3.2 oz) IBW/kg (Calculated) : 68.4  Temp (24hrs), Avg:98.3 F (36.8 C), Min:98.1 F (36.7 C), Max:98.4 F (36.9 C)  Recent Labs  Lab 01/06/23 1540 01/07/23 0018 01/07/23 0601  WBC 6.6 6.0 5.8  CREATININE 0.92 0.86 0.80  LATICACIDVEN 1.6  --   --     Estimated Creatinine Clearance: 138 mL/min (by C-G formula based on SCr of 0.8 mg/dL).    Allergies  Allergen Reactions   Lisinopril Swelling and Other (See Comments)    Lips and mouth became swollen   Penicillins Anaphylaxis, Itching, Swelling and Rash   Ibuprofen Other (See Comments)    Was told to not take this (because of a prior ulcer)   Tomato Hives and Other (See Comments)    Certain tomato sauces break out the lips in HIVES    Antimicrobials this admission: 5/22 Meropenem >> 5/23 Vancomycin >>  Dose adjustments this admission:   Microbiology results: 5/23 MRSA PCR:   Thank you for allowing pharmacy to be a part of this patient's care.  Lynann Beaver PharmD, BCPS [facility] main pharmacy [phone] 01/07/2023 7:27 AM

## 2023-01-07 NOTE — Progress Notes (Signed)
Discharge instructions given to patient and all questions were answered.  

## 2023-01-07 NOTE — Progress Notes (Signed)
  Transition of Care [facility]) Screening Note   Patient Details  Name: ORVAN WILMOTT Date of Birth: 05-May-1981   [facility]) CM/SW Contact:    Adrian Prows, RN Phone Number: 01/07/2023, 12:05 PM    Transition of Care Department [facility]) has reviewed patient and no TOC needs have been identified at this time. We will continue to monitor patient advancement through interdisciplinary progression rounds. If new patient transition needs arise, please place a TOC consult.

## 2023-01-07 NOTE — Discharge Summary (Signed)
Physician Discharge Summary   Patient: Brandon Mcneil MRN: [id number] DOB: [birth year]  Admit date:     01/06/2023  Discharge date: 01/07/23  Discharge Physician: Briant Cedar   PCP: Grayce Sessions, NP   Recommendations at discharge:   Follow-up with general surgery as discussed Follow-up with PCP  Discharge Diagnoses: Principal Problem:   Poorly controlled diabetes mellitus (HCC) Active Problems:   Perirectal fistula    Hospital Course: Brandon Mcneil is a 42 y.o. 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 Patient reported new onset swelling and pain in the right gluteal cleft inferior to the anus about 4 days ago prior to presentation to the ED. Over the last 4 days he had trouble defecating because of the pain. Patient was using bleach and water baths to the area and reports that swelling actually spontaneously drained on 5/23 with marked relief of pain.  However due to the recurrent nature of his condition, presented to the ED for further workup.  General surgery Dr. Michaell Cowing saw patient admitted detailed recommendation about follow-up care.  Patient admitted for further management due to uncontrolled blood sugars in the setting of recurrent abscesses/fistula.    Today, patient very eager to be discharged as he needs to take care of his autistic nephew who will be home alone.  General surgery saw patient, needed him to stay for further evaluation, but patient unable.  General surgery okay with him being discharged and requested for him not to sign AMA so as to get his antibiotics/prescriptions which I agree with.  Sent all p.o. antibiotics and prescriptions for his diabetes to [facility] wellness pharmacy.  Plans to follow-up with general surgery for further management.  Detailed discussion was had with patient's as the risk involved and not staying which includes reaccumulation of  abscess formation, sepsis etc, verbalized understanding.    Assessment and Plan: Perirectal abscess with fistula formation Currently afebrile, with no leukocytosis MRI showed above General surgery consulted, concerned about reaccumulation of abscess that may not respond to p.o. antibiotics alone, recommend he may need EUA/seton placement prior to discharge since he may not even need for definitive outpatient management.  Patient refused stating he needed to be discharged Status post meropenem, vancomycin Discharge patient on p.o. Flagyl and Bactrim for 10 days as per general surgery Advised to follow-up with general surgery as well as PCP ASAP  Uncontrolled type 2 diabetes mellitus with hyperglycemia Last A1c in 2023 was 10.3 Patient not compliant to his meds or diet Advised to follow-up with his PCP for further management Renewed all his prescription and sent it to [facility] wellness pharmacy  Hypertension Not on any medication  Hypokalemia Replaced as needed  Alcohol abuse Drinks about 2 40s beer daily, with liquor Currently in an outpatient abstinence program of which he attends 2 times a week Advised to quit  Tobacco abuse Advised to quit  Obesity Lifestyle modification advised       Consultants: General surgery Procedures performed: None Disposition: Home Diet recommendation:  Cardiac and Carb modified diet   DISCHARGE MEDICATION: Allergies as of 01/07/2023       Reactions   Lisinopril Swelling, Other (See Comments)   Lips and mouth became swollen   Penicillins Anaphylaxis, Itching, Swelling, Rash   Ibuprofen Other (See Comments)   Was told to not take this (because of a prior ulcer)   Tomato Hives, Other (See Comments)  Certain tomato sauces break out the lips in HIVES        Medication List     TAKE these medications    acetaminophen 325 MG tablet Commonly known as: Tylenol Take 2 tablets (650 mg total) by mouth in the morning and at  bedtime. What changed:  how much to take when to take this reasons to take this   amLODipine 10 MG tablet Commonly known as: NORVASC Take 1 tablet (10 mg total) by mouth daily.   aspirin EC 81 MG tablet Take 81 mg by mouth in the morning.   Basaglar KwikPen 100 UNIT/ML Inject 20 Units into the skin daily. What changed: Another medication with the same name was removed. Continue taking this medication, and follow the directions you see here.   blood glucose meter kit and supplies Kit Dispense based on patient and insurance preference. Use up to four times daily as directed. (FOR ICD-9 250.00, 250.01).   glipiZIDE 10 MG tablet Commonly known as: GLUCOTROL Take 10 mg by mouth 2 (two) times daily before a meal.   hydrocortisone cream 0.5 % Apply 1 Application topically 2 (two) times daily. Apply around neck   metFORMIN 500 MG 24 hr tablet Commonly known as: GLUCOPHAGE-XR Take 1 tablet (500 mg total) by mouth 2 (two) times daily with a meal. What changed: Another medication with the same name was removed. Continue taking this medication, and follow the directions you see here.   metroNIDAZOLE 500 MG tablet Commonly known as: FLAGYL Take 1 tablet (500 mg total) by mouth 3 (three) times daily.   polyethylene glycol 17 g packet Commonly known as: MIRALAX / GLYCOLAX Take 17 g by mouth 2 (two) times daily.   pravastatin 20 MG tablet Commonly known as: PRAVACHOL Take 1 tablet (20 mg total) by mouth every other day at bedtime. What changed:  how much to take how to take this when to take this additional instructions   senna-docusate 8.6-50 MG tablet Commonly known as: Senokot-S Take 1 tablet by mouth 2 (two) times daily.   sulfamethoxazole-trimethoprim 800-160 MG tablet Commonly known as: BACTRIM DS Take 1 tablet by mouth 2 (two) times daily.   True Metrix Blood Glucose Test test strip Generic drug: glucose blood Use to check blood sugar three times daily.   True  Metrix Meter w/Device Kit Use to check blood sugar three times daily.   TRUEplus 5-Bevel Pen Needles 32G X 4 MM Misc Generic drug: Insulin Pen Needle Use to inject Basaglar once daily.   TRUEplus Lancets 28G Misc Use to check blood sugar three times daily.   witch hazel-glycerin pad Commonly known as: TUCKS Apply topically as needed for irritation or hemorrhoids.        Follow-up Information     [facility], [place]. Schedule an appointment as soon as possible for a visit in 2 week(s).   Specialty: General Surgery Why: Follow-up with colorectal surgeon to discuss surgical repair of probable anal fistula once this current infection is under control and diabetes is under better control Contact information: [address] [place] [address] [phone]        Grayce Sessions, NP In 3 days.   Specialty: Internal Medicine Contact information: [address] [place] [address] [phone]                Discharge Exam: Ceasar Mons Weights   01/06/23 1408  Weight: 104.4 kg   General: NAD  Cardiovascular: S1, S2 present  Respiratory: CTAB Abdomen: Soft, nontender, nondistended, bowel sounds present Musculoskeletal: No bilateral pedal edema noted Skin: Noted mild tenderness, with noted perirectal ulcer appears to no longer be draining Psychiatry: Normal mood   Condition at discharge: stable  The results of significant diagnostics from this hospitalization (including imaging, microbiology, ancillary and laboratory) are listed below for reference.   Imaging Studies: MR PELVIS W WO CONTRAST  Result Date: 01/07/2023 CLINICAL DATA:  History of perianal abscess. Now with apparent draining fistula. EXAM: MRI PELVIS WITHOUT AND WITH CONTRAST TECHNIQUE: Multiplanar multisequence MR imaging of the pelvis was performed both before and after administration of intravenous contrast. CONTRAST:  10mL GADAVIST GADOBUTROL 1 MMOL/ML  IV SOLN COMPARISON:  Abdomen and pelvis CT 01/06/2023 FINDINGS: Urinary Tract:  Unremarkable. Bowel: A very low perianal fistula is identified arising from the 6 o'clock position in the region of the external sphincter (see axial T2 image 23 of series 7 and sagittal 33 of series 4). Fistula tracks posteriorly deep to the skin of the right-side of the intergluteal fold with surrounding edema/inflammation. Associated 2.2 x 0.7 x 1.1 cm fluid collection in this sudden for facial subcutaneous tissues just deep to the skin of the right intergluteal fold is compatible with an associated tiny abscess. Vascular/Lymphatic: No pathologically enlarged lymph nodes. No significant vascular abnormality seen. Reproductive:  No mass or other significant abnormality Other:  None. Musculoskeletal: No focal suspicious marrow enhancement within the visualized bony anatomy. IMPRESSION: Very low perianal fistula arising from the 6 o'clock position in the region of the external sphincter. Fistula tracks posteriorly deep to the skin of the right-side of the intergluteal fold with surrounding edema/inflammation, consistent with associated cellulitis. Associated 2.2 x 0.7 x 1.1 cm fluid collection just deep to the skin of the right-side of the intergluteal fold is compatible with an associated tiny abscess. Electronically Signed   By: Kennith Center M.D.   On: 01/07/2023 08:00   CT ABDOMEN PELVIS W CONTRAST  Result Date: 01/06/2023 CLINICAL DATA:  Right buttock abscess EXAM: CT ABDOMEN AND PELVIS WITH CONTRAST TECHNIQUE: Multidetector CT imaging of the abdomen and pelvis was performed using the standard protocol following bolus administration of intravenous contrast. RADIATION DOSE REDUCTION: This exam was performed according to the departmental dose-optimization program which includes automated exposure control, adjustment of the mA and/or kV according to patient size and/or use of iterative reconstruction technique. CONTRAST:   OMNIPAQUE IOHEXOL 300 MG/ML  SOLN COMPARISON:  08/02/2020 and 10/22/2019 FINDINGS: Lower chest: Mild lingular scarring or subsegmental atelectasis. Hepatobiliary: Contracted gallbladder.  Otherwise unremarkable. Pancreas: Unremarkable Spleen: Unremarkable Adrenals/Urinary Tract: Mild fullness of both adrenal glands without discrete mass. The kidneys and urinary bladder appear normal. Stomach/Bowel: Unremarkable.  Normal appendix. Vascular/Lymphatic: Prominent right inguinal lymph node 1.9 cm in short axis on image 81 series 2, formerly 1.7 cm on 08/02/2020. A left inguinal lymph node measures 1.1 cm in short axis on image 81 series 2, formerly 1.0 cm. No significant atheromatous vascular calcifications observed. Reproductive: Unremarkable Other: In the right buttock along medial side of the gluteal sulcus, there is abnormal bandlike extension of density extending from the external sphincter/lower perianal region into the lower margin of the buttock. This extends along an approximately 5.9 cm vertical excursion and measures up to 1.3 by 4.3 cm in cross-section. Equivocal central hypodensity in portions of this process for example image 96 series 2. Moderate amount of inflammatory stranding in the surrounding subcutaneous tissues. Looking back at prior exams, the patient has a history of  similar findings in this region, including a larger drainable abscess in the region on 08/02/2020, for which he underwent incision and drainage on 08/02/2020. The persistence and potential recurrence of this process raises the possibility of otherwise occult perianal fistula as a cause for recurrent inflammation/infection. Although extension through the external sphincter is not well seen on today's CT, such extension is often times much better depicted by MRI. Perianal fistula protocol MRI is suggested for further characterization and for potential surgical planning. 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 IMPRESSION: 1. Abnormal bandlike extension of density from the external sphincter/lower perianal region into the lower margin of the right buttock with surrounding inflammatory stranding, in history of prior abscess incision and drainage in this vicinity in December 2021. The persistence and potential recurrence of this process raises the possibility of otherwise occult perianal fistula as a cause for recurrent inflammation/infection. Follow up perianal fistula protocol MRI with and without contrast is suggested for further characterization and potential surgical planning. 2. Mild bilateral inguinal adenopathy, probably reactive. 3. 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 Electronically Signed   By: Gaylyn Rong M.D.   On: 01/06/2023 16:38   DG Toe Great Left  Result Date: 01/06/2023 CLINICAL DATA:  Diabetes, discoloration left great toenail EXAM: LEFT GREAT TOE COMPARISON:  None Available. FINDINGS: Frontal, oblique, and lateral views of the left great toe are obtained. There are no acute or destructive bony abnormalities. Alignment is anatomic. Joint spaces are well preserved. Mild diffuse soft tissue edema. No subcutaneous gas or radiopaque foreign body. IMPRESSION: 1. Soft tissue swelling.  No fracture or radiopaque foreign body. Electronically Signed   By: Sharlet Salina M.D.   On: 01/06/2023 16:15    Microbiology: Results for orders placed or performed during the hospital encounter of 01/06/23  MRSA Next Gen by PCR, Nasal     Status: None   Collection Time: 01/07/23  7:10 AM   Specimen: Nasal Mucosa; Nasal Swab  Result Value Ref Range Status   MRSA by PCR Next Gen NOT DETECTED NOT DETECTED Final    Comment: (NOTE) The GeneXpert MRSA Assay (FDA approved for NASAL specimens only), is one component of a comprehensive MRSA colonization surveillance program. It is not intended to diagnose MRSA infection nor to guide or monitor  treatment for MRSA infections. Test performance is not FDA approved in patients less than 57 years old. Performed at [facility], [address] [place], [place] [address]     Labs: CBC: Recent Labs  Lab 01/06/23 1540 01/07/23 0018 01/07/23 0601  WBC 6.6 6.0 5.8  NEUTROABS 4.0  --   --   HGB 14.0 13.2 13.2  HCT 39.0 37.3* 36.4*  MCV 83.0 83.1 82.2  PLT 260 263 248   Basic Metabolic Panel: Recent Labs  Lab 01/06/23 1540 01/07/23 0018 01/07/23 0601  NA 135  --  134*  K 4.5  --  3.4*  CL 104  --  103  CO2 22  --  23  GLUCOSE 305*  --  188*  BUN 13  --  11  CREATININE 0.92 0.86 0.80  CALCIUM 8.8*  --  8.7*   Liver Function Tests: Recent Labs  Lab 01/06/23 1540  AST 18  ALT 18  ALKPHOS 101  BILITOT 0.5  PROT 8.1  ALBUMIN 3.5   CBG: Recent Labs  Lab 01/06/23 1858 01/07/23 0014 01/07/23 0755 01/07/23 1152  GLUCAP  207* 158* 154* 220*    Discharge time spent: less than 30 minutes.  Signed: Briant Cedar, MD [facility] 01/07/2023

## 2023-01-07 NOTE — Progress Notes (Signed)
[facility] Progress Note     Subjective: CC:  Overall feeling better. Pain controlled. Had a BM. 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 Had discussed surgery to "Remove the core of the infection" in the past but he does not have insurance/medicaid and could not afford the cost of the operation.   Objective: Vital signs in last 24 hours: Temp:  [98 F (36.7 C)-98.4 F (36.9 C)] 98 F (36.7 C) (05/23 0826) Pulse Rate:  [59-80] 65 (05/23 0826) Resp:  [17-18] 17 (05/23 0826) BP: (123-168)/(73-101) 158/101 (05/23 0826) SpO2:  [90 %-100 %] 100 % (05/23 0826) Weight:  [104.4 kg] 104.4 kg (05/22 1408) Last BM Date : 01/05/23  Intake/Output from previous day: 05/22 0701 - 05/23 0700 In: 200 [IV Piggyback:200] Out: -  Intake/Output this shift: Total I/O In: 860 [P.O.:360; IV Piggyback:500] Out: 400 [Urine:400]  PE: Gen:  Alert, NAD, pleasant Abd: Soft, non-tender, non-distended GU: in the right intergluteal cleft, inferior to the anus, there are 3 small pinhole areas that look like they were recently open/draining but are no longer draining. The inferior two areas are overlying 1 cm fluctuance. There is mild tenderness and induration. Skin: warm and dry, no rashes  Psych: A&Ox3   Lab Results:  Recent Labs    01/07/23 0018 01/07/23 0601  WBC 6.0 5.8  HGB 13.2 13.2  HCT 37.3* 36.4*  PLT 263 248   BMET Recent Labs    01/06/23 1540 01/07/23 0018 01/07/23 0601  NA 135  --  134*  K 4.5  --  3.4*  CL 104  --  103  CO2 22  --  23  GLUCOSE 305*  --  188*  BUN 13  --  11  CREATININE 0.92 0.86 0.80  CALCIUM 8.8*  --  8.7*   PT/INR Recent Labs    01/07/23 0601  LABPROT 14.6  INR 1.1   CMP     Component Value Date/Time   NA 134 (L) 01/07/2023 0601   NA 137 08/12/2021 1010   K 3.4 (L) 01/07/2023 0601   CL 103 01/07/2023 0601   CO2 23 01/07/2023 0601   GLUCOSE 188 (H) 01/07/2023 0601   BUN 11 01/07/2023 0601   BUN 14 08/12/2021  1010   CREATININE 0.80 01/07/2023 0601   CALCIUM 8.7 (L) 01/07/2023 0601   PROT 8.1 01/06/2023 1540   PROT 7.2 08/12/2021 1010   ALBUMIN 3.5 01/06/2023 1540   ALBUMIN 4.3 08/12/2021 1010   AST 18 01/06/2023 1540   ALT 18 01/06/2023 1540   ALKPHOS 101 01/06/2023 1540   BILITOT 0.5 01/06/2023 1540   BILITOT 0.5 08/12/2021 1010   GFRNONAA >60 01/07/2023 0601   GFRAA 117 06/19/2020 1146   Lipase     Component Value Date/Time   LIPASE 36 12/29/2021 1121       Studies/Results: MR PELVIS W WO CONTRAST  Result Date: 01/07/2023 CLINICAL DATA:  History of perianal abscess. Now with apparent draining fistula. EXAM: MRI PELVIS WITHOUT AND WITH CONTRAST TECHNIQUE: Multiplanar multisequence MR imaging of the pelvis was performed both before and after administration of intravenous contrast. CONTRAST:  10mL GADAVIST GADOBUTROL 1 MMOL/ML IV SOLN COMPARISON:  Abdomen and pelvis CT 01/06/2023 FINDINGS: Urinary Tract:  Unremarkable. Bowel: A very low perianal fistula is identified arising from the 6 o'clock position in the region of the external sphincter (see axial T2 image 23 of series 7 and sagittal 33 of series 4). Fistula tracks posteriorly deep to  the skin of the right-side of the intergluteal fold with surrounding edema/inflammation. Associated 2.2 x 0.7 x 1.1 cm fluid collection in this sudden for facial subcutaneous tissues just deep to the skin of the right intergluteal fold is compatible with an associated tiny abscess. Vascular/Lymphatic: No pathologically enlarged lymph nodes. No significant vascular abnormality seen. Reproductive:  No mass or other significant abnormality Other:  None. Musculoskeletal: No focal suspicious marrow enhancement within the visualized bony anatomy. IMPRESSION: Very low perianal fistula arising from the 6 o'clock position in the region of the external sphincter. Fistula tracks posteriorly deep to the skin of the right-side of the intergluteal fold with surrounding  edema/inflammation, consistent with associated cellulitis. Associated 2.2 x 0.7 x 1.1 cm fluid collection just deep to the skin of the right-side of the intergluteal fold is compatible with an associated tiny abscess. Electronically Signed   By: Kennith Center M.D.   On: 01/07/2023 08:00   CT ABDOMEN PELVIS W CONTRAST  Result Date: 01/06/2023 CLINICAL DATA:  Right buttock abscess EXAM: CT ABDOMEN AND PELVIS WITH CONTRAST TECHNIQUE: Multidetector CT imaging of the abdomen and pelvis was performed using the standard protocol following bolus administration of intravenous contrast. RADIATION DOSE REDUCTION: This exam was performed according to the departmental dose-optimization program which includes automated exposure control, adjustment of the mA and/or kV according to patient size and/or use of iterative reconstruction technique. CONTRAST:  OMNIPAQUE IOHEXOL 300 MG/ML  SOLN COMPARISON:  08/02/2020 and 10/22/2019 FINDINGS: Lower chest: Mild lingular scarring or subsegmental atelectasis. Hepatobiliary: Contracted gallbladder.  Otherwise unremarkable. Pancreas: Unremarkable Spleen: Unremarkable Adrenals/Urinary Tract: Mild fullness of both adrenal glands without discrete mass. The kidneys and urinary bladder appear normal. Stomach/Bowel: Unremarkable.  Normal appendix. Vascular/Lymphatic: Prominent right inguinal lymph node 1.9 cm in short axis on image 81 series 2, formerly 1.7 cm on 08/02/2020. A left inguinal lymph node measures 1.1 cm in short axis on image 81 series 2, formerly 1.0 cm. No significant atheromatous vascular calcifications observed. Reproductive: Unremarkable Other: In the right buttock along medial side of the gluteal sulcus, there is abnormal bandlike extension of density extending from the external sphincter/lower perianal region into the lower margin of the buttock. This extends along an approximately 5.9 cm vertical excursion and measures up to 1.3 by 4.3 cm in cross-section. Equivocal  central hypodensity in portions of this process for example image 96 series 2. Moderate amount of inflammatory stranding in the surrounding subcutaneous tissues. Looking back at prior exams, the patient has a history of similar findings in this region, including a larger drainable abscess in the region on 08/02/2020, for which he underwent incision and drainage on 08/02/2020. The persistence and potential recurrence of this process raises the possibility of otherwise occult perianal fistula as a cause for recurrent inflammation/infection. Although extension through the external sphincter is not well seen on today's CT, such extension is often times much better depicted by MRI. Perianal fistula protocol MRI is suggested for further characterization and for potential surgical planning. 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 IMPRESSION: 1. Abnormal bandlike extension of density from the external sphincter/lower perianal region into the lower margin of the right buttock with surrounding inflammatory stranding, in history of prior abscess incision and drainage in this vicinity in December 2021. The persistence and potential recurrence of this process raises the possibility of otherwise occult perianal fistula as a cause for recurrent inflammation/infection. Follow up perianal fistula protocol MRI with and without contrast is suggested  for further characterization and potential surgical planning. 2. Mild bilateral inguinal adenopathy, probably reactive. 3. 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 Electronically Signed   By: Gaylyn Rong M.D.   On: 01/06/2023 16:38   DG Toe Great Left  Result Date: 01/06/2023 CLINICAL DATA:  Diabetes, discoloration left great toenail EXAM: LEFT GREAT TOE COMPARISON:  None Available. FINDINGS: Frontal, oblique, and lateral views of the left great toe are obtained. There are no acute or  destructive bony abnormalities. Alignment is anatomic. Joint spaces are well preserved. Mild diffuse soft tissue edema. No subcutaneous gas or radiopaque foreign body. IMPRESSION: 1. Soft tissue swelling.  No fracture or radiopaque foreign body. Electronically Signed   By: Sharlet Salina M.D.   On: 01/06/2023 16:15    Anti-infectives: Anti-infectives (From admission, onward)    Start     Dose/Rate Route Frequency Ordered Stop   01/07/23 2200  vancomycin (VANCOREADY) IVPB 1250 mg/250 mL        1,250 mg 166.7 mL/hr over 90 Minutes Intravenous Every 12 hours 01/07/23 0729     01/07/23 0745  vancomycin (VANCOREADY) IVPB 2000 mg/400 mL        2,000 mg 200 mL/hr over 120 Minutes Intravenous  Once 01/07/23 0728 01/07/23 0947   01/07/23 0000  meropenem (MERREM) 1 g in sodium chloride 0.9 % 100 mL IVPB        1 g 200 mL/hr over 30 Minutes Intravenous Every 8 hours 01/06/23 2023     01/06/23 1545  clindamycin (CLEOCIN) IVPB 600 mg        600 mg 100 mL/hr over 30 Minutes Intravenous  Once 01/06/23 1531 01/06/23 1648   01/06/23 1545  vancomycin (VANCOCIN) IVPB 1000 mg/200 mL premix        1,000 mg 200 mL/hr over 60 Minutes Intravenous  Once 01/06/23 1531 01/06/23 2000   01/06/23 1545  meropenem (MERREM) 1 g in sodium chloride 0.9 % 100 mL IVPB        1 g 200 mL/hr over 30 Minutes Intravenous  Once 01/06/23 1536 01/06/23 1836   01/06/23 0000  sulfamethoxazole-trimethoprim (BACTRIM DS) 800-160 MG tablet        1 tablet Oral 2 times daily 01/06/23 1753     01/06/23 0000  metroNIDAZOLE (FLAGYL) 500 MG tablet        500 mg Oral 3 times daily 01/06/23 1753          Assessment/Plan  42 y/o M with poorly controlled DM who presents with 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 He may need  EUA/seton placement prior to discharge, especially since it sounds like he cannot afford definitive outpatient management of this fistula upon discharge. Will review with MD prior to making final recommendations.     LOS: 0 days   I reviewed nursing notes, ED provider notes, hospitalist notes, last 24 h vitals and pain scores, last 48 h intake and output, last 24 h labs and trends, and last 24 h imaging results.  This care required moderate level of medical decision making.   Hosie Spangle, PA-C [facility] Please see Amion for pager number during day hours 7:00am-4:30pm

## 2023-01-07 NOTE — ED Notes (Signed)
ED TO INPATIENT HANDOFF REPORT  Name/Age/Gender Brandon Mcneil [birth year] 42 y.o. male  Code Status    Code Status Orders  (From admission, onward)           Start     Ordered   01/06/23 2027  Full code  Continuous       Question:  By:  Answer:  Consent: discussion documented in EHR   01/06/23 2027           Code Status History     Date Active Date Inactive Code Status Order ID Comments User Context   06/07/2022 1000 06/07/2022 1747 Full Code [id number]  Theron Arista, PA-C ED   08/17/2021 0216 08/17/2021 1856 Full Code [id number]  Marily Memos, MD ED   08/02/2020 1700 08/03/2020 1956 Full Code [id number]  Jae Dire, MD Inpatient   09/10/2013 2355 09/15/2013 1837 Full Code [id number]  Eddie North, MD Inpatient   09/10/2013 2052 09/10/2013 2355 Full Code [id number]  Margie Billet, MD ED       Home/SNF/Other Home  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]  Level of Care/Admitting Diagnosis ED Disposition     ED Disposition  Admit   Condition  --   Comment  Hospital Area: [facility] [[id number]]  Level of Care: Med-Surg [16]  May place patient in observation at [facility] or [facility] if equivalent level of care is available:: No  Covid Evaluation: Asymptomatic - no recent exposure (last 10 days) testing not required  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]  Admitting Physician: Nolberto Hanlon [[id number]]  Attending Physician: Nolberto Hanlon [[id number]]          Medical History Past Medical History:  Diagnosis Date   Abscess, perirectal 08/12/2020   Bipolar I disorder, most recent episode depressed (HCC) 09/11/2013   Depression    Essential hypertension 02/03/2017   Hypercholesteremia    Lumbar foraminal stenosis 01/06/2023   Poorly controlled diabetes mellitus (HCC) 01/06/2023   Tobacco use disorder 09/11/2013    Allergies Allergies  Allergen Reactions   Lisinopril Swelling and Other (See Comments)    Lips and mouth became swollen    Penicillins Anaphylaxis, Itching, Swelling and Rash   Ibuprofen Other (See Comments)    Was told to not take this (because of a prior ulcer)   Tomato Hives and Other (See Comments)    Certain tomato sauces break out the lips in HIVES    IV Location/Drains/Wounds Patient Lines/Drains/Airways Status     Active Line/Drains/Airways     Name Placement date Placement time Site Days   Peripheral IV 01/06/23 20 G 1" Left Antecubital 01/06/23  1541  Antecubital  1   Wound / Incision (Open or Dehisced) 08/02/20 Incision - Open Buttocks Right 08/02/20  1613  Buttocks  888            Labs/Imaging Results for orders placed or performed during the hospital encounter of 01/06/23 (from the past 48 hour(s))  CBC with Differential     Status: None   Collection Time: 01/06/23  3:40 PM  Result Value Ref Range   WBC 6.6 4.0 - 10.5 K/uL   RBC 4.70 4.22 - 5.81 MIL/uL   Hemoglobin 14.0 13.0 - 17.0 g/dL   HCT 95.1 88.4 - 16.6 %   MCV 83.0 80.0 - 100.0 fL   MCH 29.8 26.0 - 34.0 pg   MCHC 35.9 30.0 - 36.0 g/dL   RDW 06.3 01.6 - 01.0 %   Platelets 260 150 - 400 K/uL  nRBC 0.0 0.0 - 0.2 %   Neutrophils Relative % 61 %   Neutro Abs 4.0 1.7 - 7.7 K/uL   Lymphocytes Relative 24 %   Lymphs Abs 1.6 0.7 - 4.0 K/uL   Monocytes Relative 13 %   Monocytes Absolute 0.9 0.1 - 1.0 K/uL   Eosinophils Relative 2 %   Eosinophils Absolute 0.1 0.0 - 0.5 K/uL   Basophils Relative 0 %   Basophils Absolute 0.0 0.0 - 0.1 K/uL   Immature Granulocytes 0 %   Abs Immature Granulocytes 0.02 0.00 - 0.07 K/uL    Comment: Performed at [facility], [address] [place], [place] [address]  Comprehensive metabolic panel     Status: Abnormal   Collection Time: 01/06/23  3:40 PM  Result Value Ref Range   Sodium 135 135 - 145 mmol/L   Potassium 4.5 3.5 - 5.1 mmol/L   Chloride 104 98 - 111 mmol/L   CO2 22 22 - 32 mmol/L   Glucose, Bld 305 (H) 70 - 99 mg/dL    Comment: Glucose reference range applies  only to samples taken after fasting for at least 8 hours.   BUN 13 6 - 20 mg/dL   Creatinine, Ser 6.04 0.61 - 1.24 mg/dL   Calcium 8.8 (L) 8.9 - 10.3 mg/dL   Total Protein 8.1 6.5 - 8.1 g/dL   Albumin 3.5 3.5 - 5.0 g/dL   AST 18 15 - 41 U/L   ALT 18 0 - 44 U/L   Alkaline Phosphatase 101 38 - 126 U/L   Total Bilirubin 0.5 0.3 - 1.2 mg/dL   GFR, Estimated >54 >09 mL/min    Comment: (NOTE) Calculated using the CKD-EPI Creatinine Equation (2021)    Anion gap 9 5 - 15    Comment: Performed at [facility], [address] [place], [place] [address]  Lactic acid, plasma     Status: None   Collection Time: 01/06/23  3:40 PM  Result Value Ref Range   Lactic Acid, Venous 1.6 0.5 - 1.9 mmol/L    Comment: Performed at [facility], [address] [place], [place] [address]  CBG monitoring, ED     Status: Abnormal   Collection Time: 01/06/23  6:58 PM  Result Value Ref Range   Glucose-Capillary 207 (H) 70 - 99 mg/dL    Comment: Glucose reference range applies only to samples taken after fasting for at least 8 hours.  CBG monitoring, ED     Status: Abnormal   Collection Time: 01/07/23 12:14 AM  Result Value Ref Range   Glucose-Capillary 158 (H) 70 - 99 mg/dL    Comment: Glucose reference range applies only to samples taken after fasting for at least 8 hours.  CBC     Status: Abnormal   Collection Time: 01/07/23 12:18 AM  Result Value Ref Range   WBC 6.0 4.0 - 10.5 K/uL   RBC 4.49 4.22 - 5.81 MIL/uL   Hemoglobin 13.2 13.0 - 17.0 g/dL   HCT 56.2 (L) 13.0 - 86.5 %   MCV 83.1 80.0 - 100.0 fL   MCH 29.4 26.0 - 34.0 pg   MCHC 35.4 30.0 - 36.0 g/dL   RDW 78.4 69.6 - 29.5 %   Platelets 263 150 - 400 K/uL   nRBC 0.0 0.0 - 0.2 %    Comment: Performed at [facility], [address] [place], [place] [address]  Creatinine, serum     Status: None  Collection Time: 01/07/23 12:18 AM  Result Value Ref Range   Creatinine, Ser 0.86 0.61 -  1.24 mg/dL   GFR, Estimated >30 >86 mL/min    Comment: (NOTE) Calculated using the CKD-EPI Creatinine Equation (2021) Performed at [facility], [address] [place], [place] [address]   CBC     Status: Abnormal   Collection Time: 01/07/23  6:01 AM  Result Value Ref Range   WBC 5.8 4.0 - 10.5 K/uL   RBC 4.43 4.22 - 5.81 MIL/uL   Hemoglobin 13.2 13.0 - 17.0 g/dL   HCT 96.2 (L) 95.2 - 84.1 %   MCV 82.2 80.0 - 100.0 fL   MCH 29.8 26.0 - 34.0 pg   MCHC 36.3 (H) 30.0 - 36.0 g/dL   RDW 32.4 40.1 - 02.7 %   Platelets 248 150 - 400 K/uL   nRBC 0.0 0.0 - 0.2 %    Comment: Performed at [facility], [address], [place], [place] [address]  Protime-INR     Status: None   Collection Time: 01/07/23  6:01 AM  Result Value Ref Range   Prothrombin Time 14.6 11.4 - 15.2 seconds   INR 1.1 0.8 - 1.2    Comment: (NOTE) INR goal varies based on device and disease states. Performed at [facility], [address] [place], [place] [address]   APTT     Status: None   Collection Time: 01/07/23  6:01 AM  Result Value Ref Range   aPTT 28 24 - 36 seconds    Comment: Performed at [facility], [address] [place], [place] [address]  Basic metabolic panel     Status: Abnormal   Collection Time: 01/07/23  6:01 AM  Result Value Ref Range   Sodium 134 (L) 135 - 145 mmol/L   Potassium 3.4 (L) 3.5 - 5.1 mmol/L   Chloride 103 98 - 111 mmol/L   CO2 23 22 - 32 mmol/L   Glucose, Bld 188 (H) 70 - 99 mg/dL    Comment: Glucose reference range applies only to samples taken after fasting for at least 8 hours.   BUN 11 6 - 20 mg/dL   Creatinine, Ser 5.63 0.61 - 1.24 mg/dL   Calcium 8.7 (L) 8.9 - 10.3 mg/dL   GFR, Estimated >87 >56 mL/min    Comment: (NOTE) Calculated using the CKD-EPI Creatinine Equation (2021)    Anion gap 8 5 - 15    Comment: Performed at [facility], [address], [place],  [place] [address]   CT ABDOMEN PELVIS W CONTRAST  Result Date: 01/06/2023 CLINICAL DATA:  Right buttock abscess EXAM: CT ABDOMEN AND PELVIS WITH CONTRAST TECHNIQUE: Multidetector CT imaging of the abdomen and pelvis was performed using the standard protocol following bolus administration of intravenous contrast. RADIATION DOSE REDUCTION: This exam was performed according to the departmental dose-optimization program which includes automated exposure control, adjustment of the mA and/or kV according to patient size and/or use of iterative reconstruction technique. CONTRAST:  OMNIPAQUE IOHEXOL 300 MG/ML  SOLN COMPARISON:  08/02/2020 and 10/22/2019 FINDINGS: Lower chest: Mild lingular scarring or subsegmental atelectasis. Hepatobiliary: Contracted gallbladder.  Otherwise unremarkable. Pancreas: Unremarkable Spleen: Unremarkable Adrenals/Urinary Tract: Mild fullness of both adrenal glands without discrete mass. The kidneys and urinary bladder appear normal. Stomach/Bowel: Unremarkable.  Normal appendix. Vascular/Lymphatic: Prominent right inguinal lymph node 1.9 cm in short axis on image 81 series 2, formerly 1.7 cm on 08/02/2020. A left  inguinal lymph node measures 1.1 cm in short axis on image 81 series 2, formerly 1.0 cm. No significant atheromatous vascular calcifications observed. Reproductive: Unremarkable Other: In the right buttock along medial side of the gluteal sulcus, there is abnormal bandlike extension of density extending from the external sphincter/lower perianal region into the lower margin of the buttock. This extends along an approximately 5.9 cm vertical excursion and measures up to 1.3 by 4.3 cm in cross-section. Equivocal central hypodensity in portions of this process for example image 96 series 2. Moderate amount of inflammatory stranding in the surrounding subcutaneous tissues. Looking back at prior exams, the patient has a history of similar findings in this region, including a larger  drainable abscess in the region on 08/02/2020, for which he underwent incision and drainage on 08/02/2020. The persistence and potential recurrence of this process raises the possibility of otherwise occult perianal fistula as a cause for recurrent inflammation/infection. Although extension through the external sphincter is not well seen on today's CT, such extension is often times much better depicted by MRI. Perianal fistula protocol MRI is suggested for further characterization and for potential surgical planning. 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 IMPRESSION: 1. Abnormal bandlike extension of density from the external sphincter/lower perianal region into the lower margin of the right buttock with surrounding inflammatory stranding, in history of prior abscess incision and drainage in this vicinity in December 2021. The persistence and potential recurrence of this process raises the possibility of otherwise occult perianal fistula as a cause for recurrent inflammation/infection. Follow up perianal fistula protocol MRI with and without contrast is suggested for further characterization and potential surgical planning. 2. Mild bilateral inguinal adenopathy, probably reactive. 3. 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 Electronically Signed   By: Gaylyn Rong M.D.   On: 01/06/2023 16:38   DG Toe Great Left  Result Date: 01/06/2023 CLINICAL DATA:  Diabetes, discoloration left great toenail EXAM: LEFT GREAT TOE COMPARISON:  None Available. FINDINGS: Frontal, oblique, and lateral views of the left great toe are obtained. There are no acute or destructive bony abnormalities. Alignment is anatomic. Joint spaces are well preserved. Mild diffuse soft tissue edema. No subcutaneous gas or radiopaque foreign body. IMPRESSION: 1. Soft tissue swelling.  No fracture or radiopaque foreign body. Electronically Signed   By:  Sharlet Salina M.D.   On: 01/06/2023 16:15    Pending Labs Unresulted Labs (From admission, onward)     Start     Ordered   01/13/23 0500  Creatinine, serum  (enoxaparin (LOVENOX)    CrCl >/= 30 ml/min)  Weekly,   R     Comments: while on enoxaparin therapy    01/06/23 2214   01/07/23 0710  MRSA Next Gen by PCR, Nasal  (MRSA Screening)  ONCE - URGENT,   URGENT        01/07/23 0709   01/06/23 2215  HIV Antibody (routine testing w rflx)  (HIV Antibody (Routine testing w reflex) panel)  Once,   R        01/06/23 2214   01/06/23 2052  Hemoglobin A1c  Once,   R       Comments: To assess prior glycemic control    01/06/23 2052            Vitals/Pain Today's Vitals   01/07/23 0530 01/07/23 0600 01/07/23 0611 01/07/23 0630  BP: 123/80 123/76  130/88  Pulse: (!) 59 (!)  59  67  Resp: 18 18  18   Temp:      TempSrc:      SpO2: 97% 96%  98%  Weight:      Height:      PainSc:   0-No pain     Isolation Precautions No active isolations  Medications Medications  acetaminophen (TYLENOL) tablet 1,000 mg (1,000 mg Oral Given 01/07/23 0751)  witch hazel-glycerin (TUCKS) pad (has no administration in time range)  meropenem (MERREM) 1 g in sodium chloride 0.9 % 100 mL IVPB (0 g Intravenous Stopped 01/07/23 0439)  celecoxib (CELEBREX) capsule 200 mg (200 mg Oral Patient Refused/Not Given 01/07/23 0020)  enoxaparin (LOVENOX) injection 50 mg (has no administration in time range)  sodium chloride flush (NS) 0.9 % injection 3 mL (3 mLs Intravenous Not Given 01/06/23 2353)  acetaminophen (TYLENOL) tablet 650 mg (has no administration in time range)    Or  acetaminophen (TYLENOL) suppository 650 mg (has no administration in time range)  ondansetron (ZOFRAN) tablet 4 mg (has no administration in time range)    Or  ondansetron (ZOFRAN) injection 4 mg (has no administration in time range)  aspirin EC tablet 81 mg (81 mg Oral Given 01/07/23 0751)  metFORMIN (GLUCOPHAGE) tablet 500 mg (has no  administration in time range)  pravastatin (PRAVACHOL) tablet 20 mg (20 mg Oral Given 01/07/23 0052)  amLODipine (NORVASC) tablet 10 mg (has no administration in time range)  insulin aspart (novoLOG) injection 0-15 Units (has no administration in time range)  insulin aspart (novoLOG) injection 0-5 Units ( Subcutaneous Not Given 01/07/23 0020)  polyethylene glycol (MIRALAX / GLYCOLAX) packet 34 g (34 g Oral Not Given 01/07/23 0020)  polyethylene glycol (MIRALAX / GLYCOLAX) packet 17 g (has no administration in time range)  insulin glargine-yfgn (SEMGLEE) injection 20 Units (20 Units Subcutaneous Given 01/07/23 0052)  vancomycin (VANCOREADY) IVPB 2000 mg/400 mL (2,000 mg Intravenous New Bag/Given 01/07/23 0747)  vancomycin (VANCOREADY) IVPB 1250 mg/250 mL (has no administration in time range)  clindamycin (CLEOCIN) IVPB 600 mg (0 mg Intravenous Stopped 01/06/23 1648)  vancomycin (VANCOCIN) IVPB 1000 mg/200 mL premix (0 mg Intravenous Stopped 01/06/23 2000)  meropenem (MERREM) 1 g in sodium chloride 0.9 % 100 mL IVPB (0 g Intravenous Stopped 01/06/23 1836)  iohexol (OMNIPAQUE) 300 MG/ML solution 100 mL (100 mLs Intravenous Contrast Given 01/06/23 1618)  fentaNYL (SUBLIMAZE) injection 50 mcg (50 mcg Intravenous Given 01/06/23 1643)  lactated ringers bolus 1,000 mL (0 mLs Intravenous Stopped 01/06/23 2000)  gadobutrol (GADAVIST) 1 MMOL/ML injection 10 mL (10 mLs Intravenous Contrast Given 01/06/23 2157)  potassium chloride SA (KLOR-CON M) CR tablet 40 mEq (40 mEq Oral Given 01/07/23 0751)    Mobility walks

## 2023-01-08 ENCOUNTER — Other Ambulatory Visit: Payer: Self-pay

## 2023-01-08 LAB — HEMOGLOBIN A1C
Hgb A1c MFr Bld: 9.5 % — ABNORMAL HIGH (ref 4.8–5.6)
Mean Plasma Glucose: 226 mg/dL

## 2023-01-12 ENCOUNTER — Telehealth: Payer: Self-pay

## 2023-01-12 NOTE — Transitions of Care (Post Inpatient/ED Visit) (Signed)
   01/12/2023  Name: Brandon Mcneil MRN: [id number] DOB: 23-Oct-1980  Today's TOC FU Call Status: Today's TOC FU Call Status:: Unsuccessul Call (1st Attempt) Unsuccessful Call (1st Attempt) Date: 01/12/23  Attempted to reach the patient regarding the most recent Inpatient/ED visit.  Follow Up Plan: Additional outreach attempts will be made to reach the patient to complete the Transitions of Care (Post Inpatient/ED visit) call.   Signature  Robyne Peers, RN

## 2023-01-13 ENCOUNTER — Telehealth: Payer: Self-pay

## 2023-01-13 NOTE — Transitions of Care (Post Inpatient/ED Visit) (Signed)
   01/13/2023  Name: Brandon Mcneil MRN: [id number] DOB: Jan 11, 1981  Today's TOC FU Call Status: Today's TOC FU Call Status:: Unsuccessful Call (2nd Attempt) Unsuccessful Call (1st Attempt) Date: 01/12/23 Unsuccessful Call (2nd Attempt) Date: 01/13/23  Attempted to reach the patient regarding the most recent Inpatient/ED visit.  Follow Up Plan: Additional outreach attempts will be made to reach the patient to complete the Transitions of Care (Post Inpatient/ED visit) call.   Signature  Robyne Peers, RN

## 2023-01-14 ENCOUNTER — Telehealth: Payer: Self-pay

## 2023-01-14 NOTE — Transitions of Care (Post Inpatient/ED Visit) (Signed)
   01/14/2023  Name: Brandon Mcneil MRN: [id number] DOB: May 26, 1981  Today's TOC FU Call Status: Today's TOC FU Call Status:: Unsuccessful Call (3rd Attempt) Unsuccessful Call (1st Attempt) Date: 01/12/23 Unsuccessful Call (2nd Attempt) Date: 01/13/23 Unsuccessful Call (3rd Attempt) Date: 01/14/23  Attempted to reach the patient regarding the most recent Inpatient/ED visit.  Follow Up Plan: No further outreach attempts will be made at this time. We have been unable to contact the patient.   He has an appointment at [facility] with Efrain Sella, NP on 01/25/2023.  Signature : Robyne Peers, RN

## 2023-01-25 ENCOUNTER — Encounter (INDEPENDENT_AMBULATORY_CARE_PROVIDER_SITE_OTHER): Payer: Self-pay | Admitting: Primary Care

## 2023-01-25 ENCOUNTER — Ambulatory Visit (INDEPENDENT_AMBULATORY_CARE_PROVIDER_SITE_OTHER): Payer: 59 | Admitting: Primary Care

## 2023-01-25 ENCOUNTER — Other Ambulatory Visit: Payer: Self-pay

## 2023-01-25 VITALS — BP 160/90 | HR 79 | Resp 16 | Wt 229.2 lb

## 2023-01-25 DIAGNOSIS — I10 Essential (primary) hypertension: Secondary | ICD-10-CM | POA: Diagnosis not present

## 2023-01-25 DIAGNOSIS — M79675 Pain in left toe(s): Secondary | ICD-10-CM

## 2023-01-25 DIAGNOSIS — Z09 Encounter for follow-up examination after completed treatment for conditions other than malignant neoplasm: Secondary | ICD-10-CM | POA: Diagnosis not present

## 2023-01-25 MED ORDER — LOSARTAN POTASSIUM 50 MG PO TABS
50.0000 mg | ORAL_TABLET | Freq: Every day | ORAL | 1 refills | Status: DC
  Filled 2023-01-25: qty 30, 30d supply, fill #0

## 2023-01-25 NOTE — Addendum Note (Signed)
Addended by: Gwinda Passe on: 01/25/2023 04:30 PM   Modules accepted: Orders

## 2023-01-25 NOTE — Patient Instructions (Signed)
Managing Your Hypertension Hypertension, also called high blood pressure, is when the force of the blood pressing against the walls of the arteries is too strong. Arteries are blood vessels that carry blood from your heart throughout your body. Hypertension forces the heart to work harder to pump blood and may cause the arteries to become narrow or stiff. Understanding blood pressure readings A blood pressure reading includes a higher number over a lower number: The first, or top, number is called the systolic pressure. It is a measure of the pressure in your arteries as your heart beats. The second, or bottom number, is called the diastolic pressure. It is a measure of the pressure in your arteries as the heart relaxes. For most people, a normal blood pressure is below 120/80. Your personal target blood pressure may vary depending on your medical conditions, your age, and other factors. Blood pressure is classified into four stages. Based on your blood pressure reading, your health care provider may use the following stages to determine what type of treatment you need, if any. Systolic pressure and diastolic pressure are measured in a unit called millimeters of mercury (mmHg). Normal Systolic pressure: below 120. Diastolic pressure: below 80. Elevated Systolic pressure: 120-129. Diastolic pressure: below 80. Hypertension stage 1 Systolic pressure: 130-139. Diastolic pressure: 80-89. Hypertension stage 2 Systolic pressure: 140 or above. Diastolic pressure: 90 or above. How can this condition affect me? Managing your hypertension is very important. Over time, hypertension can damage the arteries and decrease blood flow to parts of the body, including the brain, heart, and kidneys. Having untreated or uncontrolled hypertension can lead to: A heart attack. A stroke. A weakened blood vessel (aneurysm). Heart failure. Kidney damage. Eye damage. Memory and concentration problems. Vascular  dementia. What actions can I take to manage this condition? Hypertension can be managed by making lifestyle changes and possibly by taking medicines. Your health care provider will help you make a plan to bring your blood pressure within a normal range. You may be referred for counseling on a healthy diet and physical activity. Nutrition  Eat a diet that is high in fiber and potassium, and low in salt (sodium), added sugar, and fat. An example eating plan is called the DASH diet. DASH stands for Dietary Approaches to Stop Hypertension. To eat this way: Eat plenty of fresh fruits and vegetables. Try to fill one-half of your plate at each meal with fruits and vegetables. Eat whole grains, such as whole-wheat pasta, brown rice, or whole-grain bread. Fill about one-fourth of your plate with whole grains. Eat low-fat dairy products. Avoid fatty cuts of meat, processed or cured meats, and poultry with skin. Fill about one-fourth of your plate with lean proteins such as fish, chicken without skin, beans, eggs, and tofu. Avoid pre-made and processed foods. These tend to be higher in sodium, added sugar, and fat. Reduce your daily sodium intake. Many people with hypertension should eat less than 1,500 mg of sodium a day. Lifestyle  Work with your health care provider to maintain a healthy body weight or to lose weight. Ask what an ideal weight is for you. Get at least 30 minutes of exercise that causes your heart to beat faster (aerobic exercise) most days of the week. Activities may include walking, swimming, or biking. Include exercise to strengthen your muscles (resistance exercise), such as weight lifting, as part of your weekly exercise routine. Try to do these types of exercises for 30 minutes at least 3 days a week. Do   not use any products that contain nicotine or tobacco. These products include cigarettes, chewing tobacco, and vaping devices, such as e-cigarettes. If you need help quitting, ask your  health care provider. Control any long-term (chronic) conditions you have, such as high cholesterol or diabetes. Identify your sources of stress and find ways to manage stress. This may include meditation, deep breathing, or making time for fun activities. Alcohol use Do not drink alcohol if: Your health care provider tells you not to drink. You are pregnant, may be pregnant, or are planning to become pregnant. If you drink alcohol: Limit how much you have to: 0-1 drink a day for women. 0-2 drinks a day for men. Know how much alcohol is in your drink. In the [place] one drink equals one 12 oz bottle of beer (355 mL), one 5 oz glass of wine (148 mL), or one 1 oz glass of hard liquor (44 mL). Medicines Your health care provider may prescribe medicine if lifestyle changes are not enough to get your blood pressure under control and if: Your systolic blood pressure is 130 or higher. Your diastolic blood pressure is 80 or higher. Take medicines only as told by your health care provider. Follow the directions carefully. Blood pressure medicines must be taken as told by your health care provider. The medicine does not work as well when you skip doses. Skipping doses also puts you at risk for problems. Monitoring Before you monitor your blood pressure: Do not smoke, drink caffeinated beverages, or exercise within 30 minutes before taking a measurement. Use the bathroom and empty your bladder (urinate). Sit quietly for at least 5 minutes before taking measurements. Monitor your blood pressure at home as told by your health care provider. To do this: Sit with your back straight and supported. Place your feet flat on the floor. Do not cross your legs. Support your arm on a flat surface, such as a table. Make sure your upper arm is at heart level. Each time you measure, take two or three readings one minute apart and record the results. You may also need to have your blood pressure checked regularly by  your health care provider. General information Talk with your health care provider about your diet, exercise habits, and other lifestyle factors that may be contributing to hypertension. Review all the medicines you take with your health care provider because there may be side effects or interactions. Keep all follow-up visits. Your health care provider can help you create and adjust your plan for managing your high blood pressure. Where to find more information National Heart, Lung, and Blood Institute: www.nhlbi.nih.gov American Heart Association: www.heart.org Contact a health care provider if: You think you are having a reaction to medicines you have taken. You have repeated (recurrent) headaches. You feel dizzy. You have swelling in your ankles. You have trouble with your vision. Get help right away if: You develop a severe headache or confusion. You have unusual weakness or numbness, or you feel faint. You have severe pain in your chest or abdomen. You vomit repeatedly. You have trouble breathing. These symptoms may be an emergency. Get help right away. Call 911. Do not wait to see if the symptoms will go away. Do not drive yourself to the hospital. Summary Hypertension is when the force of blood pumping through your arteries is too strong. If this condition is not controlled, it may put you at risk for serious complications. Your personal target blood pressure may vary depending on your medical conditions,   your age, and other factors. For most people, a normal blood pressure is less than 120/80. Hypertension is managed by lifestyle changes, medicines, or both. Lifestyle changes to help manage hypertension include losing weight, eating a healthy, low-sodium diet, exercising more, stopping smoking, and limiting alcohol. This information is not intended to replace advice given to you by your health care provider. Make sure you discuss any questions you have with your health care  provider. Document Revised: 04/17/2021 Document Reviewed: 04/17/2021 Elsevier Patient Education  2024 Elsevier Inc.  

## 2023-01-25 NOTE — Progress Notes (Addendum)
[facility]   Subjective:   [name] is a 42 y.o. male presents for hospital follow up and establish care. Patient presented to ED  with he reports new onset of swelling and pain in the right gluteal cleft inferior to the anus. He had trouble defecating because of the pain.  Admit date to the hospital was 01/06/23, patient was discharged from the hospital on 01/07/23, patient was admitted for: Perirectal fistula and poor control D2M. Also, concern left great toe pain. Patient has No headache, No chest pain, No abdominal pain - No Nausea, No new weakness tingling or numbness, No Cough - shortness of breath. Bp elevated walked from house in hot son and hurting toe. Past Medical History:  Diagnosis Date   Abscess, perirectal 08/12/2020   Bipolar I disorder, most recent episode depressed (HCC) 09/11/2013   Depression    Essential hypertension 02/03/2017   Hypercholesteremia    Lumbar foraminal stenosis 01/06/2023   Poorly controlled diabetes mellitus (HCC) 01/06/2023   Tobacco use disorder 09/11/2013     Allergies  Allergen Reactions   Lisinopril Swelling and Other (See Comments)    Lips and mouth became swollen   Penicillins Anaphylaxis, Itching, Swelling and Rash   Ibuprofen Other (See Comments)    Was told to not take this (because of a prior ulcer)   Tomato Hives and Other (See Comments)    Certain tomato sauces break out the lips in HIVES      Current Outpatient Medications on File Prior to Visit  Medication Sig Dispense Refill   acetaminophen (TYLENOL) 325 MG tablet Take 2 tablets (650 mg total) by mouth in the morning and at bedtime. (Patient taking differently: Take 325 mg by mouth every 6 (six) hours as needed for mild pain or headache.) 60 tablet 0   amLODipine (NORVASC) 10 MG tablet Take 1 tablet (10 mg total) by mouth daily. 90 tablet 1   aspirin 81 MG EC tablet Take 81 mg by mouth in the morning.     blood glucose meter kit and supplies KIT  Dispense based on patient and insurance preference. Use up to four times daily as directed. (FOR ICD-9 250.00, 250.01). 1 each 0   Blood Glucose Monitoring Suppl (TRUE METRIX METER) w/Device KIT Use to check blood sugar three times daily. 1 kit 0   glipiZIDE (GLUCOTROL) 10 MG tablet Take 10 mg by mouth 2 (two) times daily before a meal.     glucose blood (TRUE METRIX BLOOD GLUCOSE TEST) test strip Use to check blood sugar three times daily. 100 each 2   hydrocortisone cream 0.5 % Apply 1 Application topically 2 (two) times daily. Apply around neck 30 g 0   Insulin Glargine (BASAGLAR KWIKPEN) 100 UNIT/ML Inject 20 Units into the skin daily. 6 mL 2   Insulin Pen Needle (TRUEPLUS 5-BEVEL PEN NEEDLES) 32G X 4 MM MISC Use to inject Basaglar once daily. 100 each 2   metFORMIN (GLUCOPHAGE-XR) 500 MG 24 hr tablet Take 1 tablet (500 mg total) by mouth 2 (two) times daily with a meal. 60 tablet 0   metroNIDAZOLE (FLAGYL) 500 MG tablet Take 1 tablet (500 mg total) by mouth 3 (three) times daily. 30 tablet 1   polyethylene glycol (MIRALAX / GLYCOLAX) 17 g packet Take 17 g by mouth 2 (two) times daily. 14 each 0   pravastatin (PRAVACHOL) 20 MG tablet Take 1 tablet (20 mg total) by mouth every other day at bedtime. 90 tablet 1  senna-docusate (SENOKOT-S) 8.6-50 MG tablet Take 1 tablet by mouth 2 (two) times daily. 60 tablet 0   sulfamethoxazole-trimethoprim (BACTRIM DS) 800-160 MG tablet Take 1 tablet by mouth 2 (two) times daily. 20 tablet 1   TRUEplus Lancets 28G MISC Use to check blood sugar three times daily. 100 each 2   witch hazel-glycerin (TUCKS) pad Apply topically as needed for irritation or hemorrhoids. 40 each 0   No current facility-administered medications on file prior to visit.   Review of System: Comprehensive ROS Pertinent positive and negative noted in HPI    Objective:  Blood Pressure (Abnormal) 145/90 (BP Location: Left Arm, Patient Position: Sitting, Cuff Size: Large)   Pulse 79    Respiration 16   Weight 229 lb 3.2 oz (104 kg)   Oxygen Saturation 97%   Body Mass Index 34.85 kg/m   Filed Weights   01/25/23 1553  Weight: 229 lb 3.2 oz (104 kg)   Physical Exam: General Appearance: Well nourished, in no apparent distress. Eyes: PERRLA, EOMs,ENT/Mouth: Ext aud canals clear,  Hearing normal.  Neck: Supple, thyroid normal.  Respiratory: Respiratory effort normal, BS equal bilaterally without rales, rhonchi, wheezing or stridor.  Cardio: RRR with no MRGs. Brisk peripheral pulses without edema.  Abdomen: Soft, + BS.  Non tender, no guarding, rebound, hernias, masses. Lymphatics: Non tender without lymphadenopathy.  Musculoskeletal: Full ROM, 5/5 strength, abnormal gait due to big toe hurting limping. Skin: Warm, dry without rashes, lesions, ecchymosis.  Neuro: Cranial nerves intact. Normal muscle tone, no cerebellar symptoms. Sensation intact.  Psych: Awake and oriented X 3, normal affect, Insight and Judgment appropriate.  Assessment:  [name] was seen today for hospitalization follow-up, diabetes and hypertension.  Diagnoses and all orders for this visit:  Essential hypertension, benign BP goal - < 130/80 Explained that having normal blood pressure is the goal and medications are helping to get to goal and maintain normal blood pressure. DIET: Limit salt intake, read nutrition labels to check salt content, limit fried and high fatty foods  Avoid using multisymptom OTC cold preparations that generally contain sudafed which can rise BP. 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 This was 01/23/23. Told patient to call back and back until appt scheduled.    Pain of left great toe     This note has been created with Education officer, environmental. Any transcriptional errors are unintentional.   Grayce Sessions, NP 01/25/2023, 3:56 PM

## 2023-01-29 ENCOUNTER — Other Ambulatory Visit (INDEPENDENT_AMBULATORY_CARE_PROVIDER_SITE_OTHER): Payer: Self-pay | Admitting: Primary Care

## 2023-01-29 ENCOUNTER — Other Ambulatory Visit: Payer: Self-pay

## 2023-01-29 DIAGNOSIS — Z760 Encounter for issue of repeat prescription: Secondary | ICD-10-CM

## 2023-01-29 DIAGNOSIS — I10 Essential (primary) hypertension: Secondary | ICD-10-CM

## 2023-01-29 MED ORDER — AMLODIPINE BESYLATE 10 MG PO TABS
10.0000 mg | ORAL_TABLET | Freq: Every day | ORAL | 1 refills | Status: DC
Start: 2023-01-29 — End: 2023-01-29
  Filled 2023-01-29: qty 30, 30d supply, fill #0

## 2023-01-29 MED ORDER — AMLODIPINE BESYLATE 10 MG PO TABS
10.0000 mg | ORAL_TABLET | Freq: Every day | ORAL | 1 refills | Status: DC
Start: 2023-01-29 — End: 2023-03-05

## 2023-01-29 NOTE — Telephone Encounter (Signed)
Medication Refill - Medication: amLODipine (NORVASC) 10 MG tablet   Pt stated he is out his medication. Please advise  Has the patient contacted their pharmacy? Yes.    (Agent: If yes, when and what did the pharmacy advise?)  Preferred Pharmacy (with phone number or street name):  [id number] - [place] ([place] - [address]  [address] [place] [address]  Phone: [phone] Fax: [phone]  Hours: Not open 24 hours   Has the patient been seen for an appointment in the last year OR does the patient have an upcoming appointment? Yes.    Agent: Please be advised that RX refills may take up to 3 business days. We ask that you follow-up with your pharmacy.

## 2023-01-29 NOTE — Telephone Encounter (Signed)
Requested Prescriptions  Pending Prescriptions Disp Refills   amLODipine (NORVASC) 10 MG tablet 90 tablet 1    Sig: Take 1 tablet (10 mg total) by mouth daily.     Cardiovascular: Calcium Channel Blockers 2 Failed - 01/29/2023  4:09 PM      Failed - Last BP in normal range    BP Readings from Last 1 Encounters:  01/25/23 (!) 160/90         Passed - Last Heart Rate in normal range    Pulse Readings from Last 1 Encounters:  01/25/23 79         Passed - Valid encounter within last 6 months    Recent Outpatient Visits           4 days ago Essential hypertension, benign   [place] [facility] Grayce Sessions, NP   3 months ago Type 2 diabetes mellitus with hyperlipidemia [facility])   [place] [facility] Grayce Sessions, NP   5 months ago Type 2 diabetes mellitus with hyperlipidemia [facility])   [place] [facility] [place] L, RPH-CPP   6 months ago Type 2 diabetes mellitus with hyperlipidemia [facility])   [place] [facility] Grayce Sessions, NP   11 months ago Type 2 diabetes mellitus with hyperlipidemia [facility])   [place] [facility] Grayce Sessions, NP       Future Appointments             In 2 weeks Randa Evens, Kinnie Scales, NP [place] [facility]

## 2023-02-01 ENCOUNTER — Other Ambulatory Visit: Payer: Self-pay

## 2023-02-02 ENCOUNTER — Other Ambulatory Visit: Payer: Self-pay

## 2023-02-15 ENCOUNTER — Other Ambulatory Visit: Payer: Self-pay

## 2023-02-15 ENCOUNTER — Encounter (INDEPENDENT_AMBULATORY_CARE_PROVIDER_SITE_OTHER): Payer: Self-pay | Admitting: Primary Care

## 2023-02-15 ENCOUNTER — Ambulatory Visit (INDEPENDENT_AMBULATORY_CARE_PROVIDER_SITE_OTHER): Payer: 59 | Admitting: Primary Care

## 2023-02-15 VITALS — BP 159/100 | HR 74 | Resp 16 | Wt 229.8 lb

## 2023-02-15 DIAGNOSIS — Z794 Long term (current) use of insulin: Secondary | ICD-10-CM | POA: Diagnosis not present

## 2023-02-15 DIAGNOSIS — M79672 Pain in left foot: Secondary | ICD-10-CM

## 2023-02-15 DIAGNOSIS — Z0130 Encounter for examination of blood pressure without abnormal findings: Secondary | ICD-10-CM

## 2023-02-15 DIAGNOSIS — Z7984 Long term (current) use of oral hypoglycemic drugs: Secondary | ICD-10-CM | POA: Diagnosis not present

## 2023-02-15 DIAGNOSIS — E1169 Type 2 diabetes mellitus with other specified complication: Secondary | ICD-10-CM

## 2023-02-15 DIAGNOSIS — I10 Essential (primary) hypertension: Secondary | ICD-10-CM | POA: Diagnosis not present

## 2023-02-15 DIAGNOSIS — E785 Hyperlipidemia, unspecified: Secondary | ICD-10-CM | POA: Diagnosis not present

## 2023-02-15 MED ORDER — METFORMIN HCL ER 500 MG PO TB24
500.0000 mg | ORAL_TABLET | Freq: Two times a day (BID) | ORAL | 1 refills | Status: DC
Start: 2023-02-15 — End: 2023-03-05
  Filled 2023-02-15: qty 60, 30d supply, fill #0

## 2023-02-15 MED ORDER — BASAGLAR KWIKPEN 100 UNIT/ML ~~LOC~~ SOPN
20.0000 [IU] | PEN_INJECTOR | Freq: Every day | SUBCUTANEOUS | 2 refills | Status: DC
Start: 2023-02-15 — End: 2023-03-05
  Filled 2023-02-15: qty 6, 30d supply, fill #0

## 2023-02-15 MED ORDER — GLIPIZIDE 10 MG PO TABS
10.0000 mg | ORAL_TABLET | Freq: Two times a day (BID) | ORAL | 1 refills | Status: DC
Start: 2023-02-15 — End: 2023-03-05
  Filled 2023-02-15: qty 60, 30d supply, fill #0

## 2023-02-15 NOTE — Progress Notes (Signed)
[facility]  Brandon Mcneil, is a 42 y.o. male  [id number]  [id number]  DOB - 1981/03/25  Chief Complaint  Patient presents with   Blood Pressure Check       Subjective:   Brandon Mcneil is a 42 y.o. male here today for a  Bp ck prior to visit he has been out amlodipine for a week to compensate he is taking 1 1/2 of losartan. He had a ham and cheese sandwich and a cigarette. (D/W ham) (note med was sent 01/29/23) Patient has No headache, No chest pain, No abdominal pain - No Nausea, No new weakness tingling or numbness, No Cough - shortness of breath. Patient states he was eating fish forgot he dropped a bone and it went into his left foot painful walking unable to feel or see entry site. Patient states he is going to Urgent care.   No problems updated.  Allergies  Allergen Reactions   Lisinopril Swelling and Other (See Comments)    Lips and mouth became swollen   Penicillins Anaphylaxis, Itching, Swelling and Rash   Ibuprofen Other (See Comments)    Was told to not take this (because of a prior ulcer)   Tomato Hives and Other (See Comments)    Certain tomato sauces break out the lips in HIVES    Past Medical History:  Diagnosis Date   Abscess, perirectal 08/12/2020   Bipolar I disorder, most recent episode depressed (HCC) 09/11/2013   Depression    Essential hypertension 02/03/2017   Hypercholesteremia    Lumbar foraminal stenosis 01/06/2023   Poorly controlled diabetes mellitus (HCC) 01/06/2023   Tobacco use disorder 09/11/2013    Current Outpatient Medications on File Prior to Visit  Medication Sig Dispense Refill   acetaminophen (TYLENOL) 325 MG tablet Take 2 tablets (650 mg total) by mouth in the morning and at bedtime. (Patient taking differently: Take 325 mg by mouth every 6 (six) hours as needed for mild pain or headache.) 60 tablet 0   amLODipine (NORVASC) 10 MG tablet Take 1 tablet (10 mg total) by mouth daily. 90 tablet 1    aspirin 81 MG EC tablet Take 81 mg by mouth in the morning.     blood glucose meter kit and supplies KIT Dispense based on patient and insurance preference. Use up to four times daily as directed. (FOR ICD-9 250.00, 250.01). 1 each 0   Blood Glucose Monitoring Suppl (TRUE METRIX METER) w/Device KIT Use to check blood sugar three times daily. 1 kit 0   glipiZIDE (GLUCOTROL) 10 MG tablet Take 10 mg by mouth 2 (two) times daily before a meal.     glucose blood (TRUE METRIX BLOOD GLUCOSE TEST) test strip Use to check blood sugar three times daily. 100 each 2   hydrocortisone cream 0.5 % Apply 1 Application topically 2 (two) times daily. Apply around neck 30 g 0   Insulin Glargine (BASAGLAR KWIKPEN) 100 UNIT/ML Inject 20 Units into the skin daily. 6 mL 2   Insulin Pen Needle (TRUEPLUS 5-BEVEL PEN NEEDLES) 32G X 4 MM MISC Use to inject Basaglar once daily. 100 each 2   losartan (COZAAR) 50 MG tablet Take 1 tablet (50 mg total) by mouth daily. 90 tablet 1   metFORMIN (GLUCOPHAGE-XR) 500 MG 24 hr tablet Take 1 tablet (500 mg total) by mouth 2 (two) times daily with a meal. 60 tablet 0   metroNIDAZOLE (FLAGYL) 500 MG tablet Take 1 tablet (500 mg total) by mouth 3 (three) times  daily. 30 tablet 1   polyethylene glycol (MIRALAX / GLYCOLAX) 17 g packet Take 17 g by mouth 2 (two) times daily. 14 each 0   pravastatin (PRAVACHOL) 20 MG tablet Take 1 tablet (20 mg total) by mouth every other day at bedtime. 90 tablet 1   sulfamethoxazole-trimethoprim (BACTRIM DS) 800-160 MG tablet Take 1 tablet by mouth 2 (two) times daily. 20 tablet 1   TRUEplus Lancets 28G MISC Use to check blood sugar three times daily. 100 each 2   witch hazel-glycerin (TUCKS) pad Apply topically as needed for irritation or hemorrhoids. 40 each 0   No current facility-administered medications on file prior to visit.    Objective:   Vitals:   02/15/23 1428 02/15/23 1429  BP: (Abnormal) 163/93 (Abnormal) 159/100  Pulse: 74   Resp: 16    SpO2: 98%   Weight: 229 lb 12.8 oz (104.2 kg)     Comprehensive ROS Pertinent positive and negative noted in HPI   Exam General appearance : Awake, alert, not in any distress. Speech Clear. Not toxic looking HEENT: Atraumatic and Normocephalic, pupils equally reactive to light and accomodation Neck: Supple, no JVD. No cervical lymphadenopathy.  Chest: Good air entry bilaterally, no added sounds  CVS: S1 S2 regular, no murmurs.  Abdomen: Bowel sounds present, Non tender and not distended with no gaurding, rigidity or rebound. Extremities: B/L Lower Ext shows no edema, both legs are warm to touch Neurology: Awake alert, and oriented X 3, CN II-XII intact, Non focal Skin: No Rash  Data Review Lab Results  Component Value Date   HGBA1C 9.5 (H) 01/07/2023   HGBA1C 9.1 (A) 10/22/2022   HGBA1C 10.3 (A) 03/09/2022    Assessment & Plan  Brandon Mcneil was seen today for blood pressure check.  Diagnoses and all orders for this visit:   Type 2 diabetes mellitus with hyperlipidemia (HCC)  Complications from uncontrolled diabetes -diabetic retinopathy leading to blindness, diabetic nephropathy leading to dialysis, decrease in circulation decrease in sores or wound healing which may lead to amputations and increase of heart attack and stroke   Essential hypertension, benign 2/2 BP check Not taking Bp meds correctly meds are at the pharmacy to be picked up BP goal - < 130/80 Explained that having normal blood pressure is the goal and medications are helping to get to goal and maintain normal blood pressure. DIET: Limit salt intake, read nutrition labels to check salt content, limit fried and high fatty foods  Avoid using multisymptom OTC cold preparations that generally contain sudafed which can rise BP. Consult with pharmacist on best cold relief products to use for persons with HTN EXERCISE Discussed incorporating exercise such as walking - 30 minutes most days of the week and can do in 10  minute intervals     Left foot pain See HPI   Patient have been counseled extensively about nutrition and exercise. Other issues discussed during this visit include: low cholesterol diet, weight control and daily exercise, foot care, annual eye examinations at Ophthalmology, importance of adherence with medications and regular follow-up. We also discussed long term complications of uncontrolled diabetes and hypertension.   No follow-ups on file.  The patient was given clear instructions to go to ER or return to medical center if symptoms don't improve, worsen or new problems develop. The patient verbalized understanding. The patient was told to call to get lab results if they haven't heard anything in the next week.   This note has been created with Dragon speech  Office manager. Any transcriptional errors are unintentional.   Grayce Sessions, NP 02/15/2023, 2:30 PM

## 2023-02-15 NOTE — Patient Instructions (Signed)
Cooking With Less Salt Cooking with less salt is one way to reduce the amount of salt (sodium) you get from food. Most people should have less than 2,300 milligrams (mg) of sodium each day. If you have high blood pressure (hypertension), you may need to limit your sodium to 1,500 mg each day. Follow the tips below to help reduce your sodium intake. What are tips for eating less sodium? Reading food labels  Check the food label before buying or using packaged ingredients. Always check the label for the serving size and sodium content. Choose products with less than 140 mg of sodium per serving. Check the % Daily Value column to see what percent of the daily recommended amount of sodium is in one serving of the product. Foods with 5% or less are low in sodium. Foods with 20% or more are high in sodium. Do not choose foods that have salt as one of the first three ingredients on the ingredients list. Always check how much sodium is in a product, even if the label says "unsalted" or "no salt added." Shopping Buy sodium-free or low-sodium products. Look for these words: Low-sodium. Sodium-free. Reduced-sodium. No salt added. Unsalted. Buy fresh or frozen foods without sauces or additives. Cooking Instead of salt, use herbs, seasonings without salt, and spices. Use sodium-free baking soda. Grill, braise, or roast foods to add flavor with less salt. Do not add salt to pasta, rice, or hot cereals. Drain and rinse canned vegetables, beans, and meat before use. Do not add salt when cooking sweets and desserts. Cook with low-sodium ingredients. Meal planning The sodium in bread can add up. Try to plan meals with other grains. These may include whole oats, quinoa, whole wheat pasta, and other whole grains that do not have sodium added to them. What foods are high in sodium? Vegetables Regular canned vegetables, except low-sodium or reduced-sodium items. Sauerkraut, pickled vegetables, and relishes.  Olives. French fries. Onion rings. Regular canned tomato sauce and paste. Regular tomato and vegetable juice. Frozen vegetables in sauces. Grains Instant hot cereals. Bread stuffing, pancake, and biscuit mixes. Croutons. Seasoned rice or pasta mixes. Noodle soup cups. Boxed or frozen macaroni and cheese. Regular salted crackers. Self-rising flour. Rolls. Bagels. Flour tortillas and wraps. Meats and other proteins Meat or fish that is salted, canned, smoked, cured, spiced, or pickled. Precooked or cured meat, such as sausages or meat loaves. Bacon. Ham. Pepperoni. Hot dogs. Corned beef. Chipped beef. Salt pork. Jerky. Pickled herring, anchovies, and sardines. Regular canned tuna. Salted nuts. Dairy Processed cheese and cheese spreads. Hard cheeses. Cheese curds. Blue cheese. Feta cheese. String cheese. Regular cottage cheese. Buttermilk. Canned milk. The items listed above may not be a full list of foods high in sodium. Talk to a dietitian to learn more. What foods are low in sodium? Fruits Fresh, frozen, or canned fruit with no sauce added. Fruit juice. Vegetables Fresh or frozen vegetables with no sauce added. "No salt added" canned vegetables. "No salt added" tomato sauce and paste. Low-sodium or reduced-sodium tomato and vegetable juice. Grains Noodles, pasta, quinoa, rice. Shredded or puffed wheat or puffed rice. Regular or quick oats (not instant). Low-sodium crackers. Low-sodium bread. Whole grain bread and whole grain pasta. Unsalted popcorn. Meats and other proteins Fresh or frozen whole meats, poultry that has not been injected with sodium, and fish with no sauce added. Unsalted nuts. Dried peas, beans, and lentils without added salt. Unsalted canned beans. Eggs. Unsalted nut butters. Low-sodium canned tuna or chicken. Dairy   Milk. Soy milk. Yogurt. Low-sodium cheeses, such as Swiss, Monterey Jack, mozzarella, and ricotta. Sherbet or ice cream (keep to  cup per serving). Cream  cheese. Fats and oils Unsalted butter or margarine. Other foods Homemade pudding. Sodium-free baking soda and baking powder. Herbs and spices. Low-sodium seasoning mixes. Beverages Coffee and tea. Carbonated beverages. The items listed above may not be a full list of foods low in sodium. Talk to a dietitian to learn more. What are some salt alternatives when cooking? Herbs, seasonings, and spices can be used instead of salt to flavor your food. Herbs should be fresh or dried. Do not choose packaged mixes. Next to the name of the herb, spice, or seasoning below are some foods you can pair it with. Herbs Bay leaves - Soups, meat and vegetable dishes, and spaghetti sauce. Basil - Italian dishes, soups, pasta, and fish dishes. Cilantro - Meat, poultry, and vegetable dishes. Chili powder - Marinades and Mexican dishes. Chives - Salad dressings and potato dishes. Cumin - Mexican dishes, couscous, and meat dishes. Dill - Fish dishes, sauces, and salads. Fennel - Meat and vegetable dishes, breads, and cookies. Garlic (do not use garlic salt) - Italian dishes, meat dishes, salad dressings, and sauces. Marjoram - Soups, potato dishes, and meat dishes. Oregano - Pizza and spaghetti sauce. Parsley - Salads, soups, pasta, and meat dishes. Rosemary - Italian dishes, salad dressings, soups, and red meats. Saffron - Fish dishes, pasta, and some poultry dishes. Sage - Stuffings and sauces. Tarragon - Fish and poultry dishes. Thyme - Stuffing, meat, and fish dishes. Seasonings Lemon juice - Fish dishes, poultry dishes, vegetables, and salads. Vinegar - Salad dressings, vegetables, and fish dishes. Spices Cinnamon - Sweet dishes, such as cakes, cookies, and puddings. Cloves - Gingerbread, puddings, and marinades for meats. Curry - Vegetable dishes, fish and poultry dishes, and stir-fry dishes. Ginger - Vegetable dishes, fish dishes, and stir-fry dishes. Nutmeg - Pasta, vegetables, poultry, fish  dishes, and custard. This information is not intended to replace advice given to you by your health care provider. Make sure you discuss any questions you have with your health care provider. Document Revised: 08/27/2022 Document Reviewed: 08/20/2022 Elsevier Patient Education  2024 Elsevier Inc.  

## 2023-02-16 ENCOUNTER — Encounter (HOSPITAL_COMMUNITY): Payer: Self-pay

## 2023-02-16 ENCOUNTER — Ambulatory Visit (HOSPITAL_COMMUNITY)
Admission: EM | Admit: 2023-02-16 | Discharge: 2023-02-16 | Disposition: A | Discharge status: Home / Self Care | Payer: 59 | Attending: Physician Assistant | Admitting: Physician Assistant

## 2023-02-16 DIAGNOSIS — M795 Residual foreign body in soft tissue: Secondary | ICD-10-CM

## 2023-02-16 MED ORDER — DOXYCYCLINE HYCLATE 100 MG PO TABS
100.0000 mg | ORAL_TABLET | Freq: Two times a day (BID) | ORAL | 0 refills | Status: AC
  Filled 2023-02-16: qty 14, 7d supply, fill #0

## 2023-02-16 NOTE — Discharge Instructions (Signed)
Take antibiotic as prescribed Recommend warm soaks Call podiatry in the morning to make an appointment

## 2023-02-16 NOTE — ED Triage Notes (Signed)
Pt presents with c/o lt foot pain. Pt states he was eating fish and the bone fell on the floor, pt states he accidentally stepped on the bone and was unable to take the bone out of his foot.

## 2023-02-16 NOTE — ED Provider Notes (Signed)
[facility]    CSN: [id number] Arrival date & time: 02/16/23  1658      History   Chief Complaint Chief Complaint  Patient presents with   Foreign Body    HPI Brandon Mcneil is a 42 y.o. male.   Patient complains of left foot pain.  He reports he was eating a fish sandwich when a bone fell out into the carpet and he accidentally stepped on it 4 days ago.  He reports trying to get it out but it was lodged in there and he could not get it out.  He reports pain is worse with weightbearing.  Denies swelling redness or draining.  He is a diabetic.  He also complains of nail discoloration that it has been going on for several months.    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   Depression 01/06/2023   Hypercholesteremia 01/06/2023   Severe obesity (BMI 35.0-39.9) with comorbidity (HCC) 01/06/2023   Alcohol abuse 06/07/2022   Abscess 08/02/2020   Stress 02/08/2017   Essential hypertension 02/03/2017   Medication overdose 10/31/2013   Bipolar I disorder, most recent episode depressed (HCC) 09/11/2013   Tobacco use disorder 09/11/2013   Pain, dental 09/11/2013   History of diabetic ketoacidosis 09/10/2013    Past Surgical History:  Procedure Laterality Date   INCISION AND DRAINAGE ABSCESS  05/2015   Left buttock   INCISION AND DRAINAGE ABSCESS  10/2015   "Natal cleft" - ED   INCISION AND DRAINAGE ABSCESS  02/2016   "natal cleft"   INCISION AND DRAINAGE  ABSCESS  04/2018   left gluteal abscess   INCISION AND DRAINAGE ABSCESS  03/2021   right gluteal   INCISION AND DRAINAGE INTRA ORAL ABSCESS  2015   INCISION AND DRAINAGE PERIRECTAL ABSCESS N/A 08/02/2020   I&D right perirectal abscess       Home Medications    Prior to Admission medications   Medication Sig Start Date End Date Taking? Authorizing Provider  doxycycline (VIBRAMYCIN) 100 MG capsule Take 1 capsule (100 mg total) by mouth 2 (two) times daily for 7 days. 02/16/23 02/23/23 Yes Ward, Tylene Fantasia, PA-C  acetaminophen (TYLENOL) 325 MG tablet Take 2 tablets (650 mg total) by mouth in the morning and at bedtime. Patient taking differently: Take 325 mg by mouth every 6 (six) hours as needed for mild pain or headache. 12/26/21   Carlisle Beers, FNP  amLODipine (NORVASC) 10 MG tablet Take 1 tablet (10 mg total) by mouth daily. 01/29/23   Grayce Sessions, NP  aspirin 81 MG EC tablet Take 81 mg by mouth in the morning.    [provider]  blood glucose meter kit and supplies KIT Dispense based on patient and insurance preference. Use up to four times daily as directed. (FOR ICD-9 250.00, 250.01). 03/13/20   Mardella Layman, MD  Blood Glucose Monitoring Suppl (TRUE METRIX METER) w/Device KIT Use to check blood sugar three times daily. 08/21/22   Hoy Register, MD  glipiZIDE (  GLUCOTROL) 10 MG tablet Take 1 tablet (10 mg total) by mouth 2 (two) times daily before a meal. 02/15/23   Grayce Sessions, NP  glucose blood (TRUE METRIX BLOOD GLUCOSE TEST) test strip Use to check blood sugar three times daily. 08/21/22   Hoy Register, MD  hydrocortisone cream 0.5 % Apply 1 Application topically 2 (two) times daily. Apply around neck 01/07/23   Briant Cedar, MD  Insulin Glargine [facility]) 100 UNIT/ML Inject 20 Units into the skin daily. 02/15/23   Grayce Sessions, NP  Insulin Pen Needle (TRUEPLUS 5-BEVEL PEN NEEDLES) 32G X 4 MM MISC Use to inject Basaglar once daily.  08/21/22   Hoy Register, MD  losartan (COZAAR) 50 MG tablet Take 1 tablet (50 mg total) by mouth daily. 01/25/23   Grayce Sessions, NP  metFORMIN (GLUCOPHAGE-XR) 500 MG 24 hr tablet Take 1 tablet (500 mg total) by mouth 2 (two) times daily with a meal. 02/15/23   Grayce Sessions, NP  metroNIDAZOLE (FLAGYL) 500 MG tablet Take 1 tablet (500 mg total) by mouth 3 (three) times daily. 01/06/23   Karie Soda, MD  polyethylene glycol (MIRALAX / GLYCOLAX) 17 g packet Take 17 g by mouth 2 (two) times daily. 01/07/23   Briant Cedar, MD  pravastatin (PRAVACHOL) 20 MG tablet Take 1 tablet (20 mg total) by mouth every other day at bedtime. 01/07/23   Briant Cedar, MD  TRUEplus Lancets 28G MISC Use to check blood sugar three times daily. 08/21/22   Hoy Register, MD  witch hazel-glycerin (TUCKS) pad Apply topically as needed for irritation or hemorrhoids. 01/07/23   Briant Cedar, MD    Family History Family History  Problem Relation Age of Onset   Healthy Mother    Healthy Father     Social History Social History   Tobacco Use   Smoking status: Every Day    Packs/day: 0.50    Years: 14.00    Additional pack years: 0.00    Total pack years: 7.00    Types: Cigarettes   Smokeless tobacco: Never   Tobacco comments:    1/2 pack a day  Vaping Use   Vaping Use: Never used  Substance Use Topics   Alcohol use: Yes    Alcohol/week: 14.0 standard drinks of alcohol    Types: 14 Cans of beer per week    Comment: 2 a day   Drug use: No    Types: Marijuana     Allergies   Lisinopril, Penicillins, Ibuprofen, and Tomato   Review of Systems Review of Systems  Constitutional:  Negative for chills and fever.  HENT:  Negative for ear pain and sore throat.   Eyes:  Negative for pain and visual disturbance.  Respiratory:  Negative for cough and shortness of breath.   Cardiovascular:  Negative for chest pain and palpitations.  Gastrointestinal:  Negative for abdominal pain  and vomiting.  Genitourinary:  Negative for dysuria and hematuria.  Musculoskeletal:  Negative for arthralgias and back pain.  Skin:  Positive for wound. Negative for color change and rash.  Neurological:  Negative for seizures and syncope.  All other systems reviewed and are negative.    Physical Exam Triage Vital Signs ED Triage Vitals [02/16/23 1723]  Enc Vitals Group     BP (!) 155/100     Pulse Rate 66     Resp 17     Temp 98.3 F (36.8 C)     Temp Source  Oral     SpO2 97 %     Weight      Height      Head Circumference      Peak Flow      Pain Score 6     Pain Loc      Pain Edu?      Excl. in GC?    No data found.  Updated Vital Signs BP (!) 155/100 (BP Location: Right Arm)   Pulse 66   Temp 98.3 F (36.8 C) (Oral)   Resp 17   SpO2 97%   Visual Acuity Right Eye Distance:   Left Eye Distance:   Bilateral Distance:    Right Eye Near:   Left Eye Near:    Bilateral Near:     Physical Exam Vitals and nursing note reviewed.  Constitutional:      General: He is not in acute distress.    Appearance: He is well-developed.  HENT:     Head: Normocephalic and atraumatic.  Eyes:     Conjunctiva/sclera: Conjunctivae normal.  Cardiovascular:     Rate and Rhythm: Normal rate and regular rhythm.     Heart sounds: No murmur heard. Pulmonary:     Effort: Pulmonary effort is normal. No respiratory distress.     Breath sounds: Normal breath sounds.  Abdominal:     Palpations: Abdomen is soft.     Tenderness: There is no abdominal tenderness.  Musculoskeletal:        General: No swelling.     Cervical back: Neck supple.       Feet:  Skin:    General: Skin is warm and dry.     Capillary Refill: Capillary refill takes less than 2 seconds.  Neurological:     Mental Status: He is alert.  Psychiatric:        Mood and Affect: Mood normal.      UC Treatments / Results  Labs (all labs ordered are listed, but only abnormal results are displayed) Labs  Reviewed - No data to display  EKG   Radiology No results found.  Procedures Procedures (including critical care time)  Medications Ordered in UC Medications - No data to display  Initial Impression / Assessment and Plan / UC Course  I have reviewed the triage vital signs and the nursing notes.  Pertinent labs & imaging results that were available during my care of the patient were reviewed by me and considered in my medical decision making (see chart for details).     Attempted to remove foreign body from foot.  Unable to locate with some probing.  Will defer to podiatry.  Recommend podiatry follow-up regardless due to discoloration of nails and diabetes.  Given small foot wound will cover with antibiotics.  Return precautions discussed.  Supportive care discussed. Final Clinical Impressions(s) / UC Diagnoses   Final diagnoses:  Foreign body (FB) in soft tissue     Discharge Instructions      Take antibiotic as prescribed Recommend warm soaks Call podiatry in the morning to make an appointment      ED Prescriptions     Medication Sig Dispense Auth. Provider   doxycycline (VIBRAMYCIN) 100 MG capsule Take 1 capsule (100 mg total) by mouth 2 (two) times daily for 7 days. 14 capsule Ward, Tylene Fantasia, PA-C      PDMP not reviewed this encounter.   Ward, Tylene Fantasia, PA-C 02/16/23 [phone]

## 2023-02-17 ENCOUNTER — Other Ambulatory Visit: Payer: Self-pay

## 2023-02-19 ENCOUNTER — Other Ambulatory Visit: Payer: Self-pay

## 2023-02-23 ENCOUNTER — Ambulatory Visit: Payer: 59 | Admitting: Pharmacist

## 2023-03-02 DIAGNOSIS — B351 Tinea unguium: Secondary | ICD-10-CM | POA: Diagnosis not present

## 2023-03-02 DIAGNOSIS — L609 Nail disorder, unspecified: Secondary | ICD-10-CM | POA: Diagnosis not present

## 2023-03-02 DIAGNOSIS — E1142 Type 2 diabetes mellitus with diabetic polyneuropathy: Secondary | ICD-10-CM | POA: Diagnosis not present

## 2023-03-02 DIAGNOSIS — R262 Difficulty in walking, not elsewhere classified: Secondary | ICD-10-CM | POA: Diagnosis not present

## 2023-03-02 DIAGNOSIS — S90852A Superficial foreign body, left foot, initial encounter: Secondary | ICD-10-CM | POA: Diagnosis not present

## 2023-03-05 ENCOUNTER — Ambulatory Visit: Payer: 59 | Attending: Primary Care | Admitting: Pharmacist

## 2023-03-05 ENCOUNTER — Other Ambulatory Visit: Payer: Self-pay

## 2023-03-05 ENCOUNTER — Encounter: Payer: Self-pay | Admitting: Pharmacist

## 2023-03-05 DIAGNOSIS — Z760 Encounter for issue of repeat prescription: Secondary | ICD-10-CM | POA: Diagnosis not present

## 2023-03-05 DIAGNOSIS — I10 Essential (primary) hypertension: Secondary | ICD-10-CM | POA: Diagnosis not present

## 2023-03-05 DIAGNOSIS — E785 Hyperlipidemia, unspecified: Secondary | ICD-10-CM

## 2023-03-05 DIAGNOSIS — Z794 Long term (current) use of insulin: Secondary | ICD-10-CM

## 2023-03-05 DIAGNOSIS — E1169 Type 2 diabetes mellitus with other specified complication: Secondary | ICD-10-CM | POA: Diagnosis not present

## 2023-03-05 DIAGNOSIS — Z7984 Long term (current) use of oral hypoglycemic drugs: Secondary | ICD-10-CM

## 2023-03-05 MED ORDER — LOSARTAN POTASSIUM 50 MG PO TABS
50.0000 mg | ORAL_TABLET | Freq: Every day | ORAL | 1 refills | Status: DC
  Filled 2023-03-05: qty 30, 30d supply, fill #0
  Filled 2023-05-25: qty 30, 30d supply, fill #1
  Filled 2023-06-23: qty 30, 30d supply, fill #2
  Filled 2023-07-29: qty 30, 30d supply, fill #3
  Filled 2023-08-30: qty 30, 30d supply, fill #4

## 2023-03-05 MED ORDER — METFORMIN HCL ER 500 MG PO TB24
500.0000 mg | ORAL_TABLET | Freq: Two times a day (BID) | ORAL | 1 refills | Status: DC
Start: 2023-03-05 — End: 2023-10-18
  Filled 2023-03-05: qty 60, 30d supply, fill #0
  Filled 2023-05-25: qty 60, 30d supply, fill #1
  Filled 2023-08-19: qty 60, 30d supply, fill #2

## 2023-03-05 MED ORDER — NOVOLOG FLEXPEN 100 UNIT/ML ~~LOC~~ SOPN
6.0000 [IU] | PEN_INJECTOR | Freq: Two times a day (BID) | SUBCUTANEOUS | 2 refills | Status: DC
  Filled 2023-03-05: qty 3, 25d supply, fill #0
  Filled 2023-05-25: qty 3, 25d supply, fill #1
  Filled 2023-08-19: qty 3, 25d supply, fill #2

## 2023-03-05 MED ORDER — AMLODIPINE BESYLATE 10 MG PO TABS
10.0000 mg | ORAL_TABLET | Freq: Every day | ORAL | 1 refills | Status: DC
Start: 2023-03-05 — End: 2023-09-20
  Filled 2023-03-05: qty 30, 30d supply, fill #0
  Filled 2023-05-25: qty 30, 30d supply, fill #1
  Filled 2023-06-23: qty 30, 30d supply, fill #2
  Filled 2023-07-29: qty 30, 30d supply, fill #3
  Filled 2023-08-30: qty 30, 30d supply, fill #4

## 2023-03-05 MED ORDER — LANTUS SOLOSTAR 100 UNIT/ML ~~LOC~~ SOPN
30.0000 [IU] | PEN_INJECTOR | Freq: Every day | SUBCUTANEOUS | 2 refills | Status: DC
  Filled 2023-03-05: qty 9, 30d supply, fill #0
  Filled 2023-05-25: qty 9, 30d supply, fill #1
  Filled 2023-06-23: qty 9, 30d supply, fill #2

## 2023-03-05 NOTE — Progress Notes (Signed)
    S:     No chief complaint on file.  42 y.o. male who presents for diabetes evaluation, education, and management.  PMH is significant for T2DM w/ hx of DKA, HTN, bipolar I, tobacco use.  Patient was referred and last seen by Primary Care Provider, Gwinda Passe, on 02/15/2023.  A1c last checked 01/07/23 and was 9.5%. I saw him at the beginning of this year and addressed medication access. I changed him to basal insulin. Since my visit with him, his fill history has been suboptimal. I've summarized his fill history PRIOR to today's visit below.   Today, patient arrives in good spirits and presents without any assistance.  Family/Social History:  Fhx: no pertinent positives  Tobacco: current 0.5 PPD smoker Alcohol: 2 cans of beer weekly   Patient admits to non-adherence. Current diabetes medications include: Lantus 20u daily (taking twice daily), metformin XR 500 mg BID Last glargine fill:  - 01/08/23 for a 30-day supply  Insurance coverage: [employer]  Patient denies hypoglycemic events.   Reported home post-prandial blood sugars: high 200s - 400s   Patient reports nocturia (nighttime urination).  Patient reports neuropathy (nerve pain). Patient denies visual changes. Patient reports self foot exams.   Patient reported dietary habits:  -Tries to adhere to a diabetic diet  Patient-reported exercise habits: none reported    O:  7 day average blood glucose: no meter with him today.   No CGM in place.    Lab Results  Component Value Date   HGBA1C 9.5 (H) 01/07/2023   There were no vitals filed for this visit.  Lipid Panel     Component Value Date/Time   CHOL 105 07/23/2022 1526   TRIG 101 07/23/2022 1526   HDL 49 07/23/2022 1526   CHOLHDL 2.1 07/23/2022 1526   LDLCALC 37 07/23/2022 1526    Clinical Atherosclerotic Cardiovascular Disease (ASCVD): Yes  The ASCVD Risk score (Arnett DK, et al., 2019) failed to calculate for the following reasons:   The valid total  cholesterol range is 130 to 320 mg/dL   A/P: Diabetes longstanding currently uncontrolled. Patient is able to verbalize appropriate hypoglycemia management plan. Medication adherence appears suboptimal. -Continue metformin 500 mg XR BID.  -Change Lantus to 30u once daily.  -Start Novolog 6u BID before lunch and dinner.  -Patient educated on purpose, proper use, and potential adverse effects of Basaglar, metformin 500 mg XR.  -Extensively discussed pathophysiology of diabetes, recommended lifestyle interventions, dietary effects on blood sugar control.  -Counseled on s/sx of and management of hypoglycemia.  -Next A1c anticipated at follow-up in 1 month.   Written patient instructions provided. Patient verbalized understanding of treatment plan.  Total time in face to face counseling 30 minutes.    Follow-up:  Pharmacist in 1 month.  Butch Penny, PharmD, Patsy [place], CPP Clinical Pharmacist [facility] [phone]

## 2023-03-08 ENCOUNTER — Other Ambulatory Visit: Payer: Self-pay

## 2023-04-16 ENCOUNTER — Ambulatory Visit: Payer: 59 | Admitting: Pharmacist

## 2023-05-03 ENCOUNTER — Other Ambulatory Visit: Payer: Self-pay

## 2023-05-03 NOTE — Progress Notes (Signed)
Brandon Mcneil [birth year] [id number]  Patient attempted to be outreached by Thomasene Ripple, PharmD Candidate to discuss hypertension. Left voicemail for patient to return our call at their convenience at [phone].  Thomasene Ripple, Student-PharmD

## 2023-05-09 ENCOUNTER — Ambulatory Visit (HOSPITAL_COMMUNITY)
Admission: EM | Admit: 2023-05-09 | Discharge: 2023-05-09 | Disposition: A | Discharge status: Home / Self Care | Payer: No Payment, Other | Attending: Nurse Practitioner | Admitting: Nurse Practitioner

## 2023-05-09 DIAGNOSIS — F1010 Alcohol abuse, uncomplicated: Secondary | ICD-10-CM | POA: Insufficient documentation

## 2023-05-09 NOTE — Discharge Instructions (Signed)
  Discharge recommendations:  Patient is to take medications as prescribed. Please see information for follow-up appointment with psychiatry and therapy. Please follow up with your primary care provider for all medical related needs.   Therapy: We recommend that patient participate in individual therapy to address mental health concerns.  Medications: The patient or guardian is to contact a medical professional and/or outpatient provider to address any new side effects that develop. The patient or guardian should update outpatient providers of any new medications and/or medication changes.   Atypical antipsychotics: If you are prescribed an atypical antipsychotic, it is recommended that your height, weight, BMI, blood pressure, fasting lipid panel, and fasting blood sugar be monitored by your outpatient providers.  Safety:  The patient should abstain from use of illicit substances/drugs and abuse of any medications. If symptoms worsen or do not continue to improve or if the patient becomes actively suicidal or homicidal then it is recommended that the patient return to the closest hospital emergency department, the [facility], or call 911 for further evaluation and treatment. National Suicide Prevention Lifeline 1-800-SUICIDE or 640-517-6152.  About 988 988 offers 24/7 access to trained crisis counselors who can help people experiencing mental health-related distress. People can call or text 988 or chat 988lifeline.org for themselves or if they are worried about a loved one who may need crisis support.  Crisis Mobile: [facility]:                     9280048679 (for crisis response 24 hours a day) [facility] Hotline:                                            (813) 021-2287    [facility] [address], [place], [place] [address] [phone]  [facility] [address] [place], [place] [address] [phone]

## 2023-05-09 NOTE — Progress Notes (Signed)
   05/09/23 2052  [facility] Triage Screening (Walk-ins at [facility] only)  How Did You Hear About [place]? Self  What Is the Reason for Your Visit/Call Today? Brandon Mcneil is a 42 year old male presenting as a voluntary walk-in to [facility] requesting alcohol detox. Patient denied SI, HI, psychosis and drug usage. Patient reported drinking 3x 40 ounces on today. Patient reports "how ever much I can get, that's how much I will drink". Patient reports worsening depressive symptoms. Patient reports self-medicating with alcohol. Patient reports history of anxiety, depression and bipolar.  How Long Has This Been Causing You Problems? > than 6 months  Have You Recently Had Any Thoughts About Hurting Yourself? No  Are You Planning to Commit Suicide/Harm Yourself At This time? No  Have you Recently Had Thoughts About Hurting Someone [name]? No  Are You Planning To Harm Someone At This Time? No  Are you currently experiencing any auditory, visual or other hallucinations? No  Have You Used Any Alcohol or Drugs in the Past 24 Hours? Yes  How long ago did you use Drugs or Alcohol? today  What Did You Use and How Much? alcohol, 3x 40 ounces and 1 can beer  Do you have any current medical co-morbidities that require immediate attention? No  Clinician description of patient physical appearance/behavior: neat / cooperative  What Do You Feel Would Help You the Most Today? Alcohol or Drug Use Treatment  If access to [facility] was not available, would you have sought care in the Emergency Department? Yes  Determination of Need Urgent (48 hours)  Options For Referral Facility-Based Crisis;Medication Management;[facility]patient [facility]    Flowsheet Row ED from 05/09/2023 in [facility] ED from 02/16/2023 in [facility] at [facility] ED to Hosp-Admission (Discharged) from 01/06/2023 in [facility] [place] [facility]  C-SSRS RISK CATEGORY No Risk No Risk No Risk

## 2023-05-09 NOTE — ED Provider Notes (Signed)
Behavioral Health Urgent Care Medical Screening Exam  Patient Name: Brandon Mcneil MRN: [id number] Date of Evaluation: 05/09/23 Chief Complaint:  I need to stop drinking Diagnosis:  Final diagnoses:  Alcohol abuse    History of Present illness: Brandon Mcneil is a 42 y.o. male. ***  Flowsheet Row ED from 05/09/2023 in [facility] ED from 02/16/2023 in [facility] at [facility] ED to Hosp-Admission (Discharged) from 01/06/2023 in [facility] [place] General Surgery  C-SSRS RISK CATEGORY No Risk No Risk No Risk       Psychiatric Specialty Exam  Presentation  General Appearance:Casual  Eye Contact:Good  Speech:Clear and Coherent  Speech Volume:Normal  Handedness:Right   Mood and Affect  Mood: Euthymic  Affect: Appropriate   Thought Process  Thought Processes: Coherent  Descriptions of Associations:Intact  Orientation:Full (Time, Place and Person)  Thought Content:WDL  Diagnosis of Schizophrenia or Schizoaffective disorder in past: No   Hallucinations:None  Ideas of Reference:None  Suicidal Thoughts:No  Homicidal Thoughts:No   Sensorium  Memory: Immediate Good; Recent Good; Remote Good  Judgment: Poor  Insight: None   Executive Functions  Concentration: Fair  Attention Span: Fair  Recall: Fair  Fund of Knowledge: Good  Language: Good   Psychomotor Activity  Psychomotor Activity: Normal   Assets  Assets: Communication Skills; Housing; Resilience; Physical Health   Sleep  Sleep: Poor  Number of hours:  4   Physical Exam: Physical Exam Review of Systems  Constitutional: Negative.   HENT: Negative.    Eyes: Negative.   Respiratory: Negative.    Cardiovascular: Negative.   Genitourinary: Negative.   Musculoskeletal: Negative.   Skin: Negative.   Neurological: Negative.   Endo/Heme/Allergies: Negative.   Psychiatric/Behavioral:  Positive for substance abuse.    Blood  pressure 138/89, pulse 98, temperature 99 F (37.2 C), temperature source Oral, resp. rate 18, SpO2 98%. There is no height or weight on file to calculate BMI.  Musculoskeletal: Strength & Muscle Tone: within normal limits Gait & Station: normal Patient leans: N/A   [facility] MSE Discharge Disposition for Follow up and Recommendations: Based on my evaluation the patient does not appear to have an emergency medical condition and can be discharged with resources and follow up care in outpatient services for Medication Management and Individual Therapy.   Jasper Riling, NP 05/09/2023, 9:54 PM

## 2023-05-10 ENCOUNTER — Telehealth (INDEPENDENT_AMBULATORY_CARE_PROVIDER_SITE_OTHER): Payer: Self-pay

## 2023-05-10 NOTE — Telephone Encounter (Signed)
Contacted pt to schedule a bp check pt didn't answer lvm   If pt calls back please schedule nurse visit for bp check will need to be before the end of the month

## 2023-05-11 ENCOUNTER — Telehealth (HOSPITAL_COMMUNITY): Payer: Self-pay

## 2023-05-11 ENCOUNTER — Telehealth (HOSPITAL_COMMUNITY): Payer: Self-pay | Admitting: Licensed Clinical Social Worker

## 2023-05-11 NOTE — Telephone Encounter (Signed)
Machi leaves a voicemail expressing an interest in attending [facility] IOP.  Myrna Blazer, MA, LCSW, [facility], LCAS 05/11/2023

## 2023-05-18 ENCOUNTER — Telehealth (HOSPITAL_COMMUNITY): Payer: Self-pay | Admitting: Licensed Clinical Social Worker

## 2023-05-18 NOTE — Telephone Encounter (Signed)
The therapist receives a voicemail from Brandon Mcneil saying that he called last week about IOP but did not receive a callback; however, [name], LMFT, LCAS did attempt to reach him leaving a HIPAA-compliant voicemail.  This therapist or Ms. Rudd will attempt to reach Brandon Mcneil again.  Myrna Blazer, MA, LCSW, [facility], LCAS 05/18/2023

## 2023-05-19 ENCOUNTER — Telehealth (HOSPITAL_COMMUNITY): Payer: Self-pay | Admitting: Licensed Clinical Social Worker

## 2023-05-19 NOTE — Telephone Encounter (Signed)
The therapist returns Mansour's call leaving a HIPAA-compliant voicemail.  Myrna Blazer, MA, LCSW, [facility], LCAS 05/19/2023

## 2023-05-25 ENCOUNTER — Other Ambulatory Visit: Payer: Self-pay

## 2023-05-25 DIAGNOSIS — F1020 Alcohol dependence, uncomplicated: Secondary | ICD-10-CM | POA: Diagnosis not present

## 2023-05-25 MED ORDER — TRAZODONE HCL 50 MG PO TABS
ORAL_TABLET | Freq: Every day | ORAL | 0 refills | Status: DC
  Filled 2023-05-25: qty 60, 30d supply, fill #0

## 2023-05-25 MED ORDER — LORAZEPAM 0.5 MG PO TABS
ORAL_TABLET | ORAL | 0 refills | Status: AC
  Filled 2023-05-25: qty 10, 4d supply, fill #0

## 2023-05-25 MED ORDER — GABAPENTIN 300 MG PO CAPS
300.0000 mg | ORAL_CAPSULE | Freq: Three times a day (TID) | ORAL | 0 refills | Status: DC
  Filled 2023-05-25: qty 90, 30d supply, fill #0

## 2023-05-25 MED ORDER — NALTREXONE HCL 50 MG PO TABS
50.0000 mg | ORAL_TABLET | Freq: Every day | ORAL | 0 refills | Status: DC
  Filled 2023-05-25: qty 30, 30d supply, fill #0

## 2023-05-27 ENCOUNTER — Ambulatory Visit (INDEPENDENT_AMBULATORY_CARE_PROVIDER_SITE_OTHER): Payer: Self-pay | Admitting: Primary Care

## 2023-06-03 ENCOUNTER — Ambulatory Visit (INDEPENDENT_AMBULATORY_CARE_PROVIDER_SITE_OTHER): Payer: Self-pay | Admitting: Primary Care

## 2023-06-03 ENCOUNTER — Telehealth (INDEPENDENT_AMBULATORY_CARE_PROVIDER_SITE_OTHER): Payer: Self-pay

## 2023-06-03 NOTE — Telephone Encounter (Signed)
Copied from CRM [id number]. Topic: General - Other >> Jun 03, 2023  9:36 AM Phill Myron wrote: Dr Reynolds Bowl has not called as of yet.   Blood sugar levels (145) and BP( 144/88) as of last night 06/02/23

## 2023-06-03 NOTE — Telephone Encounter (Signed)
Will forward to provider for recommendations

## 2023-06-07 ENCOUNTER — Encounter (INDEPENDENT_AMBULATORY_CARE_PROVIDER_SITE_OTHER): Payer: Self-pay | Admitting: Primary Care

## 2023-06-09 DIAGNOSIS — F1020 Alcohol dependence, uncomplicated: Secondary | ICD-10-CM | POA: Diagnosis not present

## 2023-06-15 DIAGNOSIS — F1020 Alcohol dependence, uncomplicated: Secondary | ICD-10-CM | POA: Diagnosis not present

## 2023-06-16 ENCOUNTER — Other Ambulatory Visit: Payer: Self-pay

## 2023-06-16 DIAGNOSIS — F1020 Alcohol dependence, uncomplicated: Secondary | ICD-10-CM | POA: Diagnosis not present

## 2023-06-16 MED ORDER — TRAZODONE HCL 50 MG PO TABS
50.0000 mg | ORAL_TABLET | Freq: Every day | ORAL | 0 refills | Status: DC | PRN
  Filled 2023-06-16 – 2023-06-18 (×2): qty 60, 30d supply, fill #0

## 2023-06-16 MED ORDER — DOXYCYCLINE HYCLATE 100 MG PO TABS
100.0000 mg | ORAL_TABLET | Freq: Two times a day (BID) | ORAL | 0 refills | Status: DC
  Filled 2023-06-16: qty 14, 7d supply, fill #0
  Filled 2023-06-16: qty 60, 30d supply, fill #0

## 2023-06-16 MED ORDER — GABAPENTIN 300 MG PO CAPS
300.0000 mg | ORAL_CAPSULE | Freq: Three times a day (TID) | ORAL | 0 refills | Status: DC
  Filled 2023-06-16: qty 90, 30d supply, fill #0
  Filled ????-??-??: fill #0

## 2023-06-16 MED ORDER — NALTREXONE HCL 50 MG PO TABS
50.0000 mg | ORAL_TABLET | Freq: Every day | ORAL | 0 refills | Status: DC
  Filled 2023-06-16 – 2023-06-18 (×2): qty 30, 30d supply, fill #0

## 2023-06-17 ENCOUNTER — Telehealth (INDEPENDENT_AMBULATORY_CARE_PROVIDER_SITE_OTHER): Payer: Self-pay | Admitting: Primary Care

## 2023-06-17 NOTE — Telephone Encounter (Signed)
I called to confirm apt with pt.

## 2023-06-18 ENCOUNTER — Other Ambulatory Visit: Payer: Self-pay

## 2023-06-18 ENCOUNTER — Ambulatory Visit (INDEPENDENT_AMBULATORY_CARE_PROVIDER_SITE_OTHER): Payer: 59 | Admitting: Primary Care

## 2023-06-18 ENCOUNTER — Encounter (INDEPENDENT_AMBULATORY_CARE_PROVIDER_SITE_OTHER): Payer: Self-pay | Admitting: Primary Care

## 2023-06-18 VITALS — BP 135/88 | HR 84 | Resp 16 | Wt 233.6 lb

## 2023-06-18 DIAGNOSIS — F3130 Bipolar disorder, current episode depressed, mild or moderate severity, unspecified: Secondary | ICD-10-CM | POA: Diagnosis not present

## 2023-06-18 DIAGNOSIS — E66811 Obesity, class 1: Secondary | ICD-10-CM

## 2023-06-18 DIAGNOSIS — F1010 Alcohol abuse, uncomplicated: Secondary | ICD-10-CM

## 2023-06-18 DIAGNOSIS — Z794 Long term (current) use of insulin: Secondary | ICD-10-CM

## 2023-06-18 DIAGNOSIS — Z6834 Body mass index (BMI) 34.0-34.9, adult: Secondary | ICD-10-CM | POA: Diagnosis not present

## 2023-06-18 DIAGNOSIS — E119 Type 2 diabetes mellitus without complications: Secondary | ICD-10-CM

## 2023-06-18 DIAGNOSIS — E785 Hyperlipidemia, unspecified: Secondary | ICD-10-CM

## 2023-06-18 DIAGNOSIS — E7800 Pure hypercholesterolemia, unspecified: Secondary | ICD-10-CM | POA: Diagnosis not present

## 2023-06-18 DIAGNOSIS — I10 Essential (primary) hypertension: Secondary | ICD-10-CM

## 2023-06-18 DIAGNOSIS — L602 Onychogryphosis: Secondary | ICD-10-CM | POA: Diagnosis not present

## 2023-06-18 DIAGNOSIS — E1169 Type 2 diabetes mellitus with other specified complication: Secondary | ICD-10-CM | POA: Diagnosis not present

## 2023-06-18 DIAGNOSIS — E6609 Other obesity due to excess calories: Secondary | ICD-10-CM | POA: Diagnosis not present

## 2023-06-18 DIAGNOSIS — F1020 Alcohol dependence, uncomplicated: Secondary | ICD-10-CM | POA: Diagnosis not present

## 2023-06-18 LAB — POCT GLYCOSYLATED HEMOGLOBIN (HGB A1C): HbA1c, POC (controlled diabetic range): 8.8 % — AB (ref 0.0–7.0)

## 2023-06-18 MED ORDER — OZEMPIC (0.25 OR 0.5 MG/DOSE) 2 MG/3ML ~~LOC~~ SOPN
0.2500 mL | PEN_INJECTOR | SUBCUTANEOUS | 3 refills | Status: DC
Start: 2023-06-18 — End: 2023-06-22
  Filled 2023-06-18: qty 3, 56d supply, fill #0

## 2023-06-18 NOTE — Progress Notes (Signed)
Established Patient Office Visit  Subjective   Patient ID: Brandon Mcneil    DOB: 1980/11/12  Age: 42 y.o. MRN: [id number]  Diabetes He presents for his follow-up diabetic visit. He has type 2 diabetes mellitus. No MedicAlert identification noted. His disease course has been improving. There are no hypoglycemic associated symptoms. There are no diabetic associated symptoms. There are no hypoglycemic complications. Symptoms are improving. Diabetic complications include impotence. Risk factors for coronary artery disease include diabetes mellitus, obesity, male sex, hypertension and tobacco exposure. Current diabetic treatment includes insulin pump. He is compliant with treatment most of the time. His weight is stable. He is following a generally healthy diet. When asked about meal planning, he reported none. He has not had a previous visit with a dietitian. He participates in exercise daily. His breakfast blood glucose range is generally 140-180 mg/dl. His overall blood glucose range is 140-180 mg/dl. An ACE inhibitor/angiotensin II receptor blocker is being taken. He does not see a podiatrist.Eye exam is not current.  Hypertension This is a chronic problem. The current episode started more than 1 year ago. The problem is unchanged. The problem is controlled. Risk factors for coronary artery disease include diabetes mellitus and obesity. Past treatments include calcium channel blockers and lifestyle changes (arb). The current treatment provides moderate improvement. Compliance problems include exercise and psychosocial issues.        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       Objective:   BP 135/88   Pulse 84   Resp 16   Wt 233 lb 9.6 oz (106 kg)   SpO2 99%   BMI 35.52 kg/m  Health Maintenance  Topic Date Due   OPHTHALMOLOGY EXAM  Never done   FOOT EXAM  08/12/2022   INFLUENZA VACCINE  03/18/2023   COVID-19 Vaccine (1 - 2023-24 season) Never done   Diabetic kidney evaluation - Urine ACR  07/24/2023   HEMOGLOBIN A1C  12/16/2023   Diabetic kidney evaluation - eGFR measurement  01/07/2024   DTaP/Tdap/Td (3 - Td or Tdap) 06/15/2026   HIV Screening  Completed   HPV VACCINES  Aged Out   Hepatitis C Screening  Discontinued    Body mass index is 35.52 kg/m.  There were no vitals taken for this visit. BP Readings from Last 3 Encounters:  06/18/23 135/88  02/16/23 (!) 155/100  02/15/23 (!) 159/100    Physical Exam  General: No apparent distress. Eyes: Extraocular eye movements intact, pupils equal and round. Neck: Supple, trachea midline. Thyroid: No enlargement, mobile without fixation, no tenderness. Cardiovascular: Regular rhythm and rate,  no murmur, normal radial pulses. Respiratory: Normal respiratory effort, clear to auscultation. Gastrointestinal: Normal pitch active bowel sounds, nontender abdomen without distention or appreciable hepatomegaly. Musculoskeletal: Normal muscle tone, no tenderness on palpation of tibia, no  excessive thoracic kyphosis. Skin: Appropriate warmth, no visible rash. Mental status: Alert, conversant, speech clear, thought logical, appropriate mood and affect, no hallucinations or delusions evident. Hematologic/lymphatic: No cervical adenopathy, no visible ecchymoses.   No results found for any visits on 06/25/22.  The ASCVD Risk score (Arnett DK, et al., 2019) failed to calculate for the following reasons:   The systolic blood pressure is missing   Cannot find a previous HDL lab   Cannot find a previous total cholesterol lab    Assessment & Plan:  Brandon Mcneil was seen today for diabetes and hypertension.  Diagnoses and all orders for this visit:  Type 2 diabetes mellitus with hyperlipidemia (HCC) -     POCT glycosylated hemoglobin (Hb A1C) -     Ambulatory referral to Ophthalmology -     Lipid Panel -     CMP14+EGFR   Bipolar I disorder, most recent episode depressed (HCC) 2/2 Alcohol abuse [facility], [place]  Type 2 diabetes mellitus without complication, with long-term current use of insulin (HCC)  Essential hypertension -     CMP14+EGFR  Hypercholesteremia -     Lipid Panel  Class 1 obesity due to excess calories without serious comorbidity with body mass index (BMI) of 34.0 to 34.9 in adult Obesity is 30-39 indicating an excess in caloric intake or underlining conditions. This may lead to other co-morbidities. Educated on lifestyle modifications of diet and exercise which may reduce obesity.    Onychauxis -     Ambulatory referral to Podiatry      Grayce Sessions, NP

## 2023-06-21 DIAGNOSIS — F1020 Alcohol dependence, uncomplicated: Secondary | ICD-10-CM | POA: Diagnosis not present

## 2023-06-22 ENCOUNTER — Other Ambulatory Visit: Payer: Self-pay | Admitting: Pharmacist

## 2023-06-22 ENCOUNTER — Other Ambulatory Visit: Payer: Self-pay

## 2023-06-22 DIAGNOSIS — F1020 Alcohol dependence, uncomplicated: Secondary | ICD-10-CM | POA: Diagnosis not present

## 2023-06-22 MED ORDER — TRULICITY 0.75 MG/0.5ML ~~LOC~~ SOAJ
0.7500 mg | SUBCUTANEOUS | 1 refills | Status: DC
  Filled 2023-06-22: qty 2, 28d supply, fill #0
  Filled 2023-07-13 – 2023-08-19 (×3): qty 2, 28d supply, fill #1

## 2023-06-23 ENCOUNTER — Other Ambulatory Visit: Payer: Self-pay

## 2023-06-23 DIAGNOSIS — F1020 Alcohol dependence, uncomplicated: Secondary | ICD-10-CM | POA: Diagnosis not present

## 2023-06-25 DIAGNOSIS — F1020 Alcohol dependence, uncomplicated: Secondary | ICD-10-CM | POA: Diagnosis not present

## 2023-06-28 DIAGNOSIS — F1020 Alcohol dependence, uncomplicated: Secondary | ICD-10-CM | POA: Diagnosis not present

## 2023-06-29 DIAGNOSIS — F1020 Alcohol dependence, uncomplicated: Secondary | ICD-10-CM | POA: Diagnosis not present

## 2023-06-30 ENCOUNTER — Other Ambulatory Visit: Payer: Self-pay

## 2023-06-30 DIAGNOSIS — F1020 Alcohol dependence, uncomplicated: Secondary | ICD-10-CM | POA: Diagnosis not present

## 2023-07-01 ENCOUNTER — Ambulatory Visit: Payer: 59 | Attending: Family Medicine | Admitting: Pharmacist

## 2023-07-01 ENCOUNTER — Other Ambulatory Visit: Payer: Self-pay

## 2023-07-01 ENCOUNTER — Encounter: Payer: Self-pay | Admitting: Pharmacist

## 2023-07-01 DIAGNOSIS — Z7984 Long term (current) use of oral hypoglycemic drugs: Secondary | ICD-10-CM

## 2023-07-01 DIAGNOSIS — E785 Hyperlipidemia, unspecified: Secondary | ICD-10-CM | POA: Diagnosis not present

## 2023-07-01 DIAGNOSIS — Z794 Long term (current) use of insulin: Secondary | ICD-10-CM

## 2023-07-01 DIAGNOSIS — E1169 Type 2 diabetes mellitus with other specified complication: Secondary | ICD-10-CM

## 2023-07-01 DIAGNOSIS — Z7985 Long-term (current) use of injectable non-insulin antidiabetic drugs: Secondary | ICD-10-CM | POA: Diagnosis not present

## 2023-07-01 MED ORDER — LANTUS SOLOSTAR 100 UNIT/ML ~~LOC~~ SOPN
20.0000 [IU] | PEN_INJECTOR | Freq: Every day | SUBCUTANEOUS | 2 refills | Status: DC
  Filled 2023-07-01 – 2023-07-30 (×2): qty 9, 45d supply, fill #0

## 2023-07-01 NOTE — Progress Notes (Signed)
    S:     No chief complaint on file.  42 y.o. male who presents for diabetes evaluation, education, and management.  PMH is significant for T2DM w/ hx of DKA, HTN, bipolar I, tobacco use.  Patient was referred and last seen by Primary Care Provider, Gwinda Passe, on 06/18/2023.  A1c was 8.8% at that visit. Michelle added Trulicity at that visit.  Today, patient arrives in good spirits and presents without any assistance. He brings his Trulicity with him for injection technique counseling.   Family/Social History:  Fhx: no pertinent positives  Tobacco: current 0.5 PPD smoker Alcohol: 2 cans of beer weekly   Patient admits to non-adherence. Current diabetes medications include: Lantus 20u daily, Novolog 6u BID, metformin XR 500 mg BID, Trulicity 0.75 mg weekly  Insurance coverage: [employer]  Patient denies hypoglycemic events.  Reported home post-prandial blood sugars: reports 140s-160s. No meter with him today. No CGM in place.   Patient denies nocturia (nighttime urination).  Patient demoes neuropathy (nerve pain). Patient denies visual changes. Patient reports self foot exams.   Patient reported dietary habits:  -Tries to adhere to a diabetic diet  Patient-reported exercise habits: none reported   O:  Lab Results  Component Value Date   HGBA1C 8.8 (A) 06/18/2023   There were no vitals filed for this visit.  Lipid Panel     Component Value Date/Time   CHOL 105 07/23/2022 1526   TRIG 101 07/23/2022 1526   HDL 49 07/23/2022 1526   CHOLHDL 2.1 07/23/2022 1526   LDLCALC 37 07/23/2022 1526    Clinical Atherosclerotic Cardiovascular Disease (ASCVD): Yes  The ASCVD Risk score (Arnett DK, et al., 2019) failed to calculate for the following reasons:   The valid total cholesterol range is 130 to 320 mg/dL   A/P: Diabetes longstanding currently uncontrolled. Patient is able to verbalize appropriate hypoglycemia management plan. Medication adherence appears  suboptimal. -Continue metformin 500 mg XR BID.  -Continue Lantus 20u once daily.  -Continue Novolog 6u BID before lunch and dinner.  -Start Trulicity 0.75 mg weekly. Patient was educated on the use of the Trulicity pen. Gave first injection in clinic today. -Patient educated on purpose, proper use, and potential adverse effects of Trulicity. -Extensively discussed pathophysiology of diabetes, recommended lifestyle interventions, dietary effects on blood sugar control.  -Counseled on s/sx of and management of hypoglycemia.  -Next A1c anticipated 09/2023.  Written patient instructions provided. Patient verbalized understanding of treatment plan. Total time in face to face counseling 15 minutes.    Follow-up:  Pharmacist in 1 month.  Butch Penny, PharmD, Patsy [place], CPP Clinical Pharmacist [facility] [phone]

## 2023-07-02 ENCOUNTER — Other Ambulatory Visit: Payer: Self-pay

## 2023-07-02 DIAGNOSIS — F1020 Alcohol dependence, uncomplicated: Secondary | ICD-10-CM | POA: Diagnosis not present

## 2023-07-02 MED ORDER — GABAPENTIN 300 MG PO CAPS
300.0000 mg | ORAL_CAPSULE | Freq: Three times a day (TID) | ORAL | 0 refills | Status: DC
  Filled 2023-07-02: qty 90, 30d supply, fill #0

## 2023-07-02 MED ORDER — TRAZODONE HCL 50 MG PO TABS: 50.0000 mg | ORAL_TABLET | Freq: Every day | ORAL | 0 refills | Status: AC

## 2023-07-02 MED ORDER — NALTREXONE HCL 50 MG PO TABS
50.0000 mg | ORAL_TABLET | Freq: Every day | ORAL | 0 refills | Status: DC
  Filled 2023-07-02: qty 30, 30d supply, fill #0

## 2023-07-05 ENCOUNTER — Ambulatory Visit: Payer: 59 | Admitting: Podiatry

## 2023-07-12 ENCOUNTER — Ambulatory Visit (INDEPENDENT_AMBULATORY_CARE_PROVIDER_SITE_OTHER): Payer: 59 | Admitting: Podiatry

## 2023-07-12 ENCOUNTER — Ambulatory Visit (INDEPENDENT_AMBULATORY_CARE_PROVIDER_SITE_OTHER): Payer: 59

## 2023-07-12 ENCOUNTER — Encounter: Payer: Self-pay | Admitting: Podiatry

## 2023-07-12 ENCOUNTER — Ambulatory Visit: Payer: 59 | Admitting: Pharmacist

## 2023-07-12 DIAGNOSIS — M778 Other enthesopathies, not elsewhere classified: Secondary | ICD-10-CM | POA: Diagnosis not present

## 2023-07-12 DIAGNOSIS — B351 Tinea unguium: Secondary | ICD-10-CM | POA: Diagnosis not present

## 2023-07-12 DIAGNOSIS — L923 Foreign body granuloma of the skin and subcutaneous tissue: Secondary | ICD-10-CM | POA: Diagnosis not present

## 2023-07-12 NOTE — Progress Notes (Signed)
Subjective:   Patient ID: Brandon Mcneil, male   DOB: 42 y.o.   MRN: [id number]   HPI Patient presents stating that he has significant nail disease of both feet and has a lesion on his left foot that is been sore and he thinks he stepped on something.  Patient smokes a half a pack of cigarettes per day and tries to be active   Review of Systems  All other systems reviewed and are negative.       Objective:  Physical Exam Vitals and nursing note reviewed.  Constitutional:      Appearance: He is well-developed.  Pulmonary:     Effort: Pulmonary effort is normal.  Musculoskeletal:        General: Normal range of motion.  Skin:    General: Skin is warm.  Neurological:     Mental Status: He is alert.     Neurovascular status intact muscle strength adequate range of motion adequate thick deformed nailbeds 1-5 both feet history of this condition with family and only mild to moderate discomfort associated with them.  Also noted to have lesion subleft foot that appears to be foreign body     Assessment:  Foreign body plantar left along with chronic mycotic nail infection bilateral     Plan:  H&P reviewed condition and do not recommend current treatment of nails except for debridement which she does well and I did Sharp sterile debridement of lesion left no iatrogenic bleeding and will be seen back for routine care  X-rays indicate no sign of foreign body or other pathology with condition

## 2023-07-13 ENCOUNTER — Other Ambulatory Visit: Payer: Self-pay

## 2023-07-14 ENCOUNTER — Other Ambulatory Visit: Payer: Self-pay

## 2023-07-21 ENCOUNTER — Other Ambulatory Visit: Payer: Self-pay

## 2023-07-26 ENCOUNTER — Other Ambulatory Visit: Payer: Self-pay

## 2023-07-26 DIAGNOSIS — F1020 Alcohol dependence, uncomplicated: Secondary | ICD-10-CM | POA: Diagnosis not present

## 2023-07-27 DIAGNOSIS — F1020 Alcohol dependence, uncomplicated: Secondary | ICD-10-CM | POA: Diagnosis not present

## 2023-07-28 NOTE — Progress Notes (Shared)
[facility] - Clinic Note  07/30/2023   CHIEF COMPLAINT Patient presents for No chief complaint on file.  HISTORY OF PRESENT ILLNESS: Brandon Mcneil is a 42 y.o. male who presents to the clinic today for:   Referring physician: Grayce Sessions, NP [address] [place],  [place] [address]  HISTORICAL INFORMATION:  Selected notes from the MEDICAL RECORD NUMBER Referred by Gwinda Passe, NP for DM exam LEE:  Ocular Hx- PMH-   CURRENT MEDICATIONS: No current outpatient medications on file. (Ophthalmic Drugs)   No current facility-administered medications for this visit. (Ophthalmic Drugs)   Current Outpatient Medications (Other)  Medication Sig   acetaminophen (TYLENOL) 325 MG tablet Take 2 tablets (650 mg total) by mouth in the morning and at bedtime. (Patient taking differently: Take 325 mg by mouth every 6 (six) hours as needed for mild pain (pain score 1-3) or headache.)   amLODipine (NORVASC) 10 MG tablet Take 1 tablet (10 mg total) by mouth daily.   aspirin 81 MG EC tablet Take 81 mg by mouth in the morning.   blood glucose meter kit and supplies KIT Dispense based on patient and insurance preference. Use up to four times daily as directed. (FOR ICD-9 250.00, 250.01).   Blood Glucose Monitoring Suppl (TRUE METRIX METER) w/Device KIT Use to check blood sugar three times daily.   doxycycline (VIBRA-TABS) 100 MG tablet Take 1 tablet (100 mg total) by mouth 2 (two) times daily.   Dulaglutide (TRULICITY) 0.75 MG/0.5ML SOAJ Inject 0.75 mg into the skin once a week.   gabapentin (NEURONTIN) 300 MG capsule Take 1 capsule (300 mg total) by mouth 3 (three) times daily for anxiety.   gabapentin (NEURONTIN) 300 MG capsule Take 1 capsule (300 mg total) by mouth 3 (three) times daily.   glucose blood (TRUE METRIX BLOOD GLUCOSE TEST) test strip Use to check blood sugar three times daily.   hydrocortisone cream 0.5 % Apply 1 Application topically 2 (two) times  daily. Apply around neck   insulin aspart (NOVOLOG FLEXPEN) 100 UNIT/ML FlexPen Inject 6 Units into the skin 2 (two) times daily before a meal.   insulin glargine (LANTUS SOLOSTAR) 100 UNIT/ML Solostar Pen Inject 20 Units into the skin daily.   Insulin Pen Needle (TRUEPLUS 5-BEVEL PEN NEEDLES) 32G X 4 MM MISC Use to inject Basaglar once daily.   losartan (COZAAR) 50 MG tablet Take 1 tablet (50 mg total) by mouth daily.   metFORMIN (GLUCOPHAGE-XR) 500 MG 24 hr tablet Take 1 tablet (500 mg total) by mouth 2 (two) times daily with a meal.   metroNIDAZOLE (FLAGYL) 500 MG tablet Take 1 tablet (500 mg total) by mouth 3 (three) times daily.   naltrexone (DEPADE) 50 MG tablet Take one tablet daily at the same time every day for alcohol   naltrexone (DEPADE) 50 MG tablet Take 1 tablet (50 mg total) by mouth daily at the same time every day for alcohol   polyethylene glycol (MIRALAX / GLYCOLAX) 17 g packet Take 17 g by mouth 2 (two) times daily.   pravastatin (PRAVACHOL) 20 MG tablet Take 1 tablet (20 mg total) by mouth every other day at bedtime.   traZODone (DESYREL) 50 MG tablet Take 1-2 tablets by mouth at bedtime as needed for sleep   traZODone (DESYREL) 50 MG tablet Take 1-2 tablets (50-100 mg total) by mouth daily as needed at bedtime for sleep.   traZODone (DESYREL) 50 MG tablet Take 1-2 tablets (50-100 mg total)  by mouth at bedtime for sleep.   TRUEplus Lancets 28G MISC Use to check blood sugar three times daily.   witch hazel-glycerin (TUCKS) pad Apply topically as needed for irritation or hemorrhoids.   No current facility-administered medications for this visit. (Other)   REVIEW OF SYSTEMS:  ALLERGIES Allergies  Allergen Reactions   Lisinopril Swelling and Other (See Comments)    Lips and mouth became swollen   Penicillins Anaphylaxis, Itching, Swelling and Rash   Ibuprofen Other (See Comments)    Was told to not take this (because of a prior ulcer)   Tomato Hives and Other (See  Comments)    Certain tomato sauces break out the lips in HIVES   PAST MEDICAL HISTORY Past Medical History:  Diagnosis Date   Abscess, perirectal 08/12/2020   Bipolar I disorder, most recent episode depressed (HCC) 09/11/2013   Depression    Essential hypertension 02/03/2017   Hypercholesteremia    Lumbar foraminal stenosis 01/06/2023   Poorly controlled diabetes mellitus (HCC) 01/06/2023   Tobacco use disorder 09/11/2013   Past Surgical History:  Procedure Laterality Date   INCISION AND DRAINAGE ABSCESS  05/2015   Left buttock   INCISION AND DRAINAGE ABSCESS  10/2015   "Natal cleft" - ED   INCISION AND DRAINAGE ABSCESS  02/2016   "natal cleft"   INCISION AND DRAINAGE ABSCESS  04/2018   left gluteal abscess   INCISION AND DRAINAGE ABSCESS  03/2021   right gluteal   INCISION AND DRAINAGE INTRA ORAL ABSCESS  2015   INCISION AND DRAINAGE PERIRECTAL ABSCESS N/A 08/02/2020   I&D right perirectal abscess   FAMILY HISTORY Family History  Problem Relation Age of Onset   Healthy Mother    Healthy Father    SOCIAL HISTORY Social History   Tobacco Use   Smoking status: Every Day    Current packs/day: 0.50    Average packs/day: 0.5 packs/day for 14.0 years (7.0 ttl pk-yrs)    Types: Cigarettes   Smokeless tobacco: Never   Tobacco comments:    1/2 pack a day  Vaping Use   Vaping status: Never Used  Substance Use Topics   Alcohol use: Yes    Alcohol/week: 14.0 standard drinks of alcohol    Types: 14 Cans of beer per week    Comment: 2 a day   Drug use: No    Types: Marijuana       OPHTHALMIC EXAM:  Not recorded    IMAGING AND PROCEDURES  Imaging and Procedures for 07/30/2023        ASSESSMENT/PLAN:   ICD-10-CM   1. Retinal edema  H35.81      1.  2.  3.  Ophthalmic Meds Ordered this visit:  No orders of the defined types were placed in this encounter.    No follow-ups on file.  There are no Patient Instructions on file for this  visit.  Explained the diagnoses, plan, and follow up with the patient and they expressed understanding.  Patient expressed understanding of the importance of proper follow up care.   This document serves as a record of services personally performed by Karie Chimera, MD, PhD. It was created on their behalf by Glee Arvin. Manson Passey, OA an ophthalmic technician. The creation of this record is the provider's dictation and/or activities during the visit.    Electronically signed by: Glee Arvin. Manson Passey, OA 07/28/23 7:56 AM   Karie Chimera, M.D., Ph.D. Diseases & Surgery of the Retina and Vitreous [facility] 07/30/2023  Abbreviations: M myopia (nearsighted); A astigmatism; H hyperopia (farsighted); P presbyopia; Mrx spectacle prescription;  CTL contact lenses; OD right eye; OS left eye; OU both eyes  XT exotropia; ET esotropia; PEK punctate epithelial keratitis; PEE punctate epithelial erosions; DES dry eye syndrome; MGD meibomian gland dysfunction; ATs artificial tears; PFAT's preservative free artificial tears; NSC nuclear sclerotic cataract; PSC posterior subcapsular cataract; ERM epi-retinal membrane; PVD posterior vitreous detachment; RD retinal detachment; DM diabetes mellitus; DR diabetic retinopathy; NPDR non-proliferative diabetic retinopathy; PDR proliferative diabetic retinopathy; CSME clinically significant macular edema; DME diabetic macular edema; dbh dot blot hemorrhages; CWS cotton wool spot; POAG primary open angle glaucoma; C/D cup-to-disc ratio; HVF humphrey visual field; GVF goldmann visual field; OCT optical coherence tomography; IOP intraocular pressure; BRVO Branch retinal vein occlusion; CRVO central retinal vein occlusion; CRAO central retinal artery occlusion; BRAO branch retinal artery occlusion; RT retinal tear; SB scleral buckle; PPV pars plana vitrectomy; VH Vitreous hemorrhage; PRP panretinal laser photocoagulation; IVK intravitreal kenalog; VMT vitreomacular  traction; MH Macular hole;  NVD neovascularization of the disc; NVE neovascularization elsewhere; AREDS age related eye disease study; ARMD age related macular degeneration; POAG primary open angle glaucoma; EBMD epithelial/anterior basement membrane dystrophy; ACIOL anterior chamber intraocular lens; IOL intraocular lens; PCIOL posterior chamber intraocular lens; Phaco/IOL phacoemulsification with intraocular lens placement; PRK photorefractive keratectomy; LASIK laser assisted in situ keratomileusis; HTN hypertension; DM diabetes mellitus; COPD chronic obstructive pulmonary disease

## 2023-07-29 ENCOUNTER — Telehealth (INDEPENDENT_AMBULATORY_CARE_PROVIDER_SITE_OTHER): Payer: Self-pay | Admitting: Primary Care

## 2023-07-29 ENCOUNTER — Other Ambulatory Visit: Payer: Self-pay

## 2023-07-30 ENCOUNTER — Encounter (INDEPENDENT_AMBULATORY_CARE_PROVIDER_SITE_OTHER): Payer: 59 | Admitting: Ophthalmology

## 2023-07-30 ENCOUNTER — Encounter (INDEPENDENT_AMBULATORY_CARE_PROVIDER_SITE_OTHER): Payer: Self-pay

## 2023-07-30 ENCOUNTER — Other Ambulatory Visit: Payer: Self-pay

## 2023-07-30 DIAGNOSIS — H3581 Retinal edema: Secondary | ICD-10-CM

## 2023-08-05 ENCOUNTER — Ambulatory Visit: Payer: Self-pay | Admitting: Pharmacist

## 2023-08-05 ENCOUNTER — Telehealth (INDEPENDENT_AMBULATORY_CARE_PROVIDER_SITE_OTHER): Payer: Self-pay | Admitting: Pharmacist

## 2023-08-05 NOTE — Telephone Encounter (Signed)
Called pt to schedule apt. Pt also mentioned that he wanted a refill on BP meds and wanted them sent to [facility] on [address].

## 2023-08-05 NOTE — Telephone Encounter (Unsigned)
Copied from CRM [id number]. Topic: General - Inquiry >> Aug 05, 2023 11:06 AM De Blanch wrote: Reason for CRM: Pt requesting to reschedule his appointment with Franky Macho from today. Pt requesting a callback to schedule.  Please advise.

## 2023-08-05 NOTE — Telephone Encounter (Signed)
Will do!

## 2023-08-06 ENCOUNTER — Other Ambulatory Visit: Payer: Self-pay

## 2023-08-09 ENCOUNTER — Other Ambulatory Visit: Payer: Self-pay

## 2023-08-09 DIAGNOSIS — F1020 Alcohol dependence, uncomplicated: Secondary | ICD-10-CM | POA: Diagnosis not present

## 2023-08-09 MED ORDER — GABAPENTIN 300 MG PO CAPS
300.0000 mg | ORAL_CAPSULE | Freq: Three times a day (TID) | ORAL | 0 refills | Status: DC
  Filled 2023-08-09 – 2023-09-01 (×2): qty 90, 30d supply, fill #0

## 2023-08-09 MED ORDER — NALTREXONE HCL 50 MG PO TABS
50.0000 mg | ORAL_TABLET | Freq: Every day | ORAL | 0 refills | Status: DC
  Filled 2023-08-09 – 2023-08-30 (×2): qty 30, 30d supply, fill #0

## 2023-08-10 ENCOUNTER — Other Ambulatory Visit: Payer: Self-pay

## 2023-08-17 ENCOUNTER — Other Ambulatory Visit: Payer: Self-pay

## 2023-08-19 ENCOUNTER — Ambulatory Visit: Payer: 59 | Attending: Primary Care | Admitting: Pharmacist

## 2023-08-19 ENCOUNTER — Encounter: Payer: Self-pay | Admitting: Pharmacist

## 2023-08-19 ENCOUNTER — Other Ambulatory Visit: Payer: Self-pay

## 2023-08-19 DIAGNOSIS — E1165 Type 2 diabetes mellitus with hyperglycemia: Secondary | ICD-10-CM

## 2023-08-19 DIAGNOSIS — Z794 Long term (current) use of insulin: Secondary | ICD-10-CM

## 2023-08-19 DIAGNOSIS — Z7984 Long term (current) use of oral hypoglycemic drugs: Secondary | ICD-10-CM | POA: Diagnosis not present

## 2023-08-19 MED ORDER — LANTUS SOLOSTAR 100 UNIT/ML ~~LOC~~ SOPN
26.0000 [IU] | PEN_INJECTOR | Freq: Every day | SUBCUTANEOUS | 2 refills | Status: DC
  Filled 2023-08-19 – 2023-09-13 (×2): qty 9, 34d supply, fill #0

## 2023-08-19 NOTE — Progress Notes (Signed)
    S:     No chief complaint on file.  43 y.o. male who presents for diabetes evaluation, education, and management.  PMH is significant for T2DM w/ hx of DKA, HTN, bipolar I, tobacco use.  Patient was referred and last seen by Primary Care Provider, Rosaline Bohr, on 06/18/2023.  A1c was 8.8% at that visit. Michelle added Trulicity . I saw him after on 07/01/2023 and instructed him on proper injection technique.   Today, patient arrives in good spirits and presents without any assistance. He took 4 injections of Trulicity  but had to stop d/t high copay. He tolerated well - denies any NV, abdominal pain, changes in vision. He us  taking Lantus  but admits to not taking metformin  or Novolog  consistently.   Family/Social History:  Fhx: no pertinent positives  Tobacco: current 0.5 PPD smoker Alcohol : 2 cans of beer weekly   Patient admits to non-adherence. Current diabetes medications include: Lantus  20u daily, Novolog  6u BID (not taking), metformin  XR 500 mg BID (taking metformin  once daily), Trulicity  0.75 mg weekly (not taking d/t high copay)  Insurance coverage: Aetna  Patient denies hypoglycemic events.  Reported home post-prandial blood sugars: reports 160s-170s. No meter with him today. No CGM in place.   Patient denies nocturia (nighttime urination).  Patient denies neuropathy (nerve pain). Patient denies visual changes. Patient reports self foot exams.   Patient reported dietary habits:  -Tries to adhere to a diabetic diet  Patient-reported exercise habits: none reported   O:  Lab Results  Component Value Date   HGBA1C 8.8 (A) 06/18/2023   There were no vitals filed for this visit.  Lipid Panel     Component Value Date/Time   CHOL 105 07/23/2022 1526   TRIG 101 07/23/2022 1526   HDL 49 07/23/2022 1526   CHOLHDL 2.1 07/23/2022 1526   LDLCALC 37 07/23/2022 1526    Clinical Atherosclerotic Cardiovascular Disease (ASCVD): Yes  The ASCVD Risk score (Arnett DK,  et al., 2019) failed to calculate for the following reasons:   The valid total cholesterol range is 130 to 320 mg/dL   A/P: Diabetes longstanding currently uncontrolled. Patient is able to verbalize appropriate hypoglycemia management plan. Medication adherence appears suboptimal. -Continue metformin  500 mg XR. Counseled to take BID as prescribed instead of once daily.  -Increase Lantus  to 26u once daily.  -Continue Novolog  6u BID before lunch and dinner.  -Extensively discussed pathophysiology of diabetes, recommended lifestyle interventions, dietary effects on blood sugar control.  -Counseled on s/sx of and management of hypoglycemia.  -Next A1c anticipated 09/2023.  Written patient instructions provided. Patient verbalized understanding of treatment plan. Total time in face to face counseling 15 minutes.    Follow-up:  Pharmacist in March. PCP next month.  Herlene Fleeta Morris, PharmD, JAQUELINE, CPP Clinical Pharmacist [facility] [phone]

## 2023-08-23 DIAGNOSIS — F1020 Alcohol dependence, uncomplicated: Secondary | ICD-10-CM | POA: Diagnosis not present

## 2023-08-24 DIAGNOSIS — F1020 Alcohol dependence, uncomplicated: Secondary | ICD-10-CM | POA: Diagnosis not present

## 2023-08-25 DIAGNOSIS — F1020 Alcohol dependence, uncomplicated: Secondary | ICD-10-CM | POA: Diagnosis not present

## 2023-08-27 DIAGNOSIS — F1020 Alcohol dependence, uncomplicated: Secondary | ICD-10-CM | POA: Diagnosis not present

## 2023-08-30 ENCOUNTER — Other Ambulatory Visit: Payer: Self-pay

## 2023-08-30 DIAGNOSIS — F1020 Alcohol dependence, uncomplicated: Secondary | ICD-10-CM | POA: Diagnosis not present

## 2023-08-31 ENCOUNTER — Encounter (INDEPENDENT_AMBULATORY_CARE_PROVIDER_SITE_OTHER): Payer: 59 | Admitting: Ophthalmology

## 2023-08-31 DIAGNOSIS — F1020 Alcohol dependence, uncomplicated: Secondary | ICD-10-CM | POA: Diagnosis not present

## 2023-08-31 DIAGNOSIS — H3581 Retinal edema: Secondary | ICD-10-CM

## 2023-09-01 ENCOUNTER — Other Ambulatory Visit: Payer: Self-pay

## 2023-09-01 DIAGNOSIS — F1020 Alcohol dependence, uncomplicated: Secondary | ICD-10-CM | POA: Diagnosis not present

## 2023-09-03 DIAGNOSIS — F1020 Alcohol dependence, uncomplicated: Secondary | ICD-10-CM | POA: Diagnosis not present

## 2023-09-06 DIAGNOSIS — F1020 Alcohol dependence, uncomplicated: Secondary | ICD-10-CM | POA: Diagnosis not present

## 2023-09-07 ENCOUNTER — Other Ambulatory Visit: Payer: Self-pay

## 2023-09-07 DIAGNOSIS — F1020 Alcohol dependence, uncomplicated: Secondary | ICD-10-CM | POA: Diagnosis not present

## 2023-09-07 MED ORDER — NALTREXONE HCL 50 MG PO TABS
50.0000 mg | ORAL_TABLET | Freq: Every day | ORAL | 0 refills | Status: AC
  Filled 2023-09-07 – 2023-10-18 (×2): qty 30, 30d supply, fill #0
  Filled 2023-12-07: qty 30, 30d supply, fill #1

## 2023-09-07 MED ORDER — GABAPENTIN 300 MG PO CAPS
300.0000 mg | ORAL_CAPSULE | Freq: Three times a day (TID) | ORAL | 0 refills | Status: DC
  Filled 2023-09-07 – 2023-09-13 (×2): qty 90, 30d supply, fill #0

## 2023-09-07 MED ORDER — DOXYCYCLINE HYCLATE 100 MG PO CAPS
100.0000 mg | ORAL_CAPSULE | Freq: Two times a day (BID) | ORAL | 0 refills | Status: DC
  Filled 2023-09-07: qty 14, 7d supply, fill #0

## 2023-09-08 DIAGNOSIS — F1020 Alcohol dependence, uncomplicated: Secondary | ICD-10-CM | POA: Diagnosis not present

## 2023-09-10 ENCOUNTER — Other Ambulatory Visit: Payer: Self-pay

## 2023-09-10 DIAGNOSIS — F1020 Alcohol dependence, uncomplicated: Secondary | ICD-10-CM | POA: Diagnosis not present

## 2023-09-13 ENCOUNTER — Other Ambulatory Visit: Payer: Self-pay | Admitting: Family Medicine

## 2023-09-13 ENCOUNTER — Other Ambulatory Visit: Payer: Self-pay

## 2023-09-13 MED ORDER — NOVOLOG FLEXPEN 100 UNIT/ML ~~LOC~~ SOPN
6.0000 [IU] | PEN_INJECTOR | Freq: Two times a day (BID) | SUBCUTANEOUS | 2 refills | Status: DC
  Filled 2023-09-13 – 2023-09-29 (×3): qty 3, 25d supply, fill #0

## 2023-09-14 ENCOUNTER — Other Ambulatory Visit: Payer: Self-pay

## 2023-09-20 ENCOUNTER — Ambulatory Visit (INDEPENDENT_AMBULATORY_CARE_PROVIDER_SITE_OTHER): Payer: 59 | Admitting: Primary Care

## 2023-09-20 ENCOUNTER — Other Ambulatory Visit: Payer: Self-pay

## 2023-09-20 ENCOUNTER — Encounter (INDEPENDENT_AMBULATORY_CARE_PROVIDER_SITE_OTHER): Payer: Self-pay | Admitting: Primary Care

## 2023-09-20 DIAGNOSIS — Z760 Encounter for issue of repeat prescription: Secondary | ICD-10-CM | POA: Diagnosis not present

## 2023-09-20 DIAGNOSIS — E119 Type 2 diabetes mellitus without complications: Secondary | ICD-10-CM | POA: Diagnosis not present

## 2023-09-20 DIAGNOSIS — I10 Essential (primary) hypertension: Secondary | ICD-10-CM

## 2023-09-20 DIAGNOSIS — Z794 Long term (current) use of insulin: Secondary | ICD-10-CM | POA: Diagnosis not present

## 2023-09-20 LAB — POCT GLYCOSYLATED HEMOGLOBIN (HGB A1C): HbA1c, POC (controlled diabetic range): 8.9 % — AB (ref 0.0–7.0)

## 2023-09-20 MED ORDER — AMLODIPINE BESYLATE 10 MG PO TABS: 10.0000 mg | ORAL_TABLET | Freq: Every day | ORAL | 1 refills | Status: DC

## 2023-09-20 MED ORDER — GABAPENTIN 300 MG PO CAPS
300.0000 mg | ORAL_CAPSULE | Freq: Three times a day (TID) | ORAL | 1 refills | Status: DC
  Filled 2023-09-20 – 2023-12-07 (×2): qty 180, 60d supply, fill #0

## 2023-09-20 MED ORDER — LOSARTAN POTASSIUM 50 MG PO TABS: 50.0000 mg | ORAL_TABLET | Freq: Every day | ORAL | 1 refills | Status: DC

## 2023-09-20 NOTE — Progress Notes (Signed)
[facility]  Brandon Mcneil, is a 43 y.o. male  [id number]  [id number]  DOB - [birth year]  Chief Complaint  Patient presents with   Diabetes   Hypertension       Subjective:   Brandon Mcneil is a 43 y.o. male here today for a follow up visit HTN . Patient has No headache, No chest pain, No abdominal pain - No Nausea, No new weakness tingling or numbness, No Cough - shortness of breath. T2D-Denies polyuria, polydipsia, polyphasia or vision changes.  Does not check blood sugars at home.  HPI  No problems updated.  Comprehensive ROS Pertinent positive and negative noted in HPI   Allergies  Allergen Reactions   Lisinopril Swelling and Other (See Comments)    Lips and mouth became swollen   Penicillins Anaphylaxis, Itching, Swelling and Rash   Ibuprofen Other (See Comments)    Was told to not take this (because of a prior ulcer)   Tomato Hives and Other (See Comments)    Certain tomato sauces break out the lips in HIVES    Past Medical History:  Diagnosis Date   Abscess, perirectal 08/12/2020   Bipolar I disorder, most recent episode depressed (HCC) 09/11/2013   Depression    Essential hypertension 02/03/2017   Hypercholesteremia    Lumbar foraminal stenosis 01/06/2023   Poorly controlled diabetes mellitus (HCC) 01/06/2023   Tobacco use disorder 09/11/2013    Current Outpatient Medications on File Prior to Visit  Medication Sig Dispense Refill   acetaminophen (TYLENOL) 325 MG tablet Take 2 tablets (650 mg total) by mouth in the morning and at bedtime. (Patient taking differently: Take 325 mg by mouth every 6 (six) hours as needed for mild pain (pain score 1-3) or headache.) 60 tablet 0   aspirin 81 MG EC tablet Take 81 mg by mouth in the morning.     blood glucose meter kit and supplies KIT Dispense based on patient and insurance preference. Use up to four times daily as directed. (FOR ICD-9 250.00, 250.01). 1 each 0   Blood Glucose Monitoring  Suppl (TRUE METRIX METER) w/Device KIT Use to check blood sugar three times daily. 1 kit 0   doxycycline (VIBRA-TABS) 100 MG tablet Take 1 tablet (100 mg total) by mouth 2 (two) times daily. 14 tablet 0   doxycycline (VIBRAMYCIN) 100 MG capsule Take 1 capsule (100 mg total) by mouth 2 (two) times daily. 14 capsule 0   glucose blood (TRUE METRIX BLOOD GLUCOSE TEST) test strip Use to check blood sugar three times daily. 100 each 2   hydrocortisone cream 0.5 % Apply 1 Application topically 2 (two) times daily. Apply around neck 30 g 0   insulin aspart (NOVOLOG FLEXPEN) 100 UNIT/ML FlexPen Inject 6 Units into the skin 2 (two) times daily before a meal. 3 mL 2   insulin glargine (LANTUS SOLOSTAR) 100 UNIT/ML Solostar Pen Inject 26 Units into the skin daily. 9 mL 2   Insulin Pen Needle (TRUEPLUS 5-BEVEL PEN NEEDLES) 32G X 4 MM MISC Use to inject Basaglar once daily. 100 each 2   metFORMIN (GLUCOPHAGE-XR) 500 MG 24 hr tablet Take 1 tablet (500 mg total) by mouth 2 (two) times daily with a meal. 180 tablet 1   metroNIDAZOLE (FLAGYL) 500 MG tablet Take 1 tablet (500 mg total) by mouth 3 (three) times daily. 30 tablet 1   naltrexone (DEPADE) 50 MG tablet Take one tablet daily at the same time every day for alcohol 60 tablet 0  naltrexone (DEPADE) 50 MG tablet Take 1 tablet (50 mg total) by mouth daily at the same time every day for alcohol 60 tablet 0   naltrexone (DEPADE) 50 MG tablet Take 1 tablet (50 mg total) by mouth daily for alcohol. 60 tablet 0   naltrexone (DEPADE) 50 MG tablet Take 1 tablet (50 mg total) by mouth daily at the same time every day for alcohol 60 tablet 0   polyethylene glycol (MIRALAX / GLYCOLAX) 17 g packet Take 17 g by mouth 2 (two) times daily. 14 each 0   pravastatin (PRAVACHOL) 20 MG tablet Take 1 tablet (20 mg total) by mouth every other day at bedtime. 90 tablet 1   traZODone (DESYREL) 50 MG tablet Take 1-2 tablets by mouth at bedtime as needed for sleep 60 tablet 0    traZODone (DESYREL) 50 MG tablet Take 1-2 tablets (50-100 mg total) by mouth daily as needed at bedtime for sleep. 60 tablet 0   traZODone (DESYREL) 50 MG tablet Take 1-2 tablets (50-100 mg total) by mouth at bedtime for sleep. 120 tablet 0   TRUEplus Lancets 28G MISC Use to check blood sugar three times daily. 100 each 2   witch hazel-glycerin (TUCKS) pad Apply topically as needed for irritation or hemorrhoids. 40 each 0   No current facility-administered medications on file prior to visit.   Health Maintenance  Topic Date Due   Eye exam for diabetics  Never done   Pneumococcal Vaccination (2 of 2 - PCV) 06/15/2017   Flu Shot  03/18/2023   COVID-19 Vaccine (1 - 2024-25 season) Never done   Yearly kidney health urinalysis for diabetes  07/24/2023   Yearly kidney function blood test for diabetes  01/07/2024   Hemoglobin A1C  03/19/2024   Complete foot exam   06/17/2024   DTaP/Tdap/Td vaccine (3 - Td or Tdap) 06/15/2026   HIV Screening  Completed   HPV Vaccine  Aged Out   Hepatitis C Screening  Discontinued    Objective:  BP (!) 149/88 (BP Location: Left Arm, Patient Position: Sitting, Cuff Size: Large)   Pulse 79   Resp 16   Ht 5\' 8"  (1.727 m)   Wt 237 lb 6.4 oz (107.7 kg)   SpO2 100%   BMI 36.10 kg/m   BP Readings from Last 3 Encounters:  09/20/23 (!) 149/88  06/18/23 135/88  02/16/23 (!) 155/100      Physical Exam Vitals reviewed.  Constitutional:      Appearance: He is obese.  HENT:     Head: Normocephalic.     Right Ear: Tympanic membrane and external ear normal.     Left Ear: Tympanic membrane and external ear normal.     Nose: Nose normal.  Eyes:     Extraocular Movements: Extraocular movements intact.  Cardiovascular:     Rate and Rhythm: Normal rate and regular rhythm.  Pulmonary:     Effort: Pulmonary effort is normal.     Breath sounds: Normal breath sounds.  Abdominal:     General: Bowel sounds are normal. There is distension.     Palpations: Abdomen  is soft.  Musculoskeletal:        General: Normal range of motion.     Cervical back: Normal range of motion and neck supple.  Skin:    General: Skin is warm and dry.  Neurological:     Mental Status: He is oriented to person, place, and time.  Psychiatric:  Mood and Affect: Mood normal.        Behavior: Behavior normal.       Assessment & Plan  Brandon Mcneil was seen today for diabetes and hypertension.  Diagnoses and all orders for this visit:  Type 2 diabetes mellitus without complication, with long-term current use of insulin (HCC) -     POCT glycosylated hemoglobin (Hb A1C) -     Microalbumin / creatinine urine ratio -     Ambulatory referral to Ophthalmology  Essential hypertension, benign BP goal - < 130/80 Explained that having normal blood pressure is the goal and medications are helping to get to goal and maintain normal blood pressure. DIET: Limit salt intake, read nutrition labels to check salt content, limit fried and high fatty foods  Avoid using multisymptom OTC cold preparations that generally contain sudafed which can rise BP. Consult with pharmacist on best cold relief products to use for persons with HTN EXERCISE Discussed incorporating exercise such as walking - 30 minutes most days of the week and can do in 10 minute intervals    -     amLODipine (NORVASC) 10 MG tablet; Take 1 tablet (10 mg total) by mouth daily.  Medication refill 2/2 Other orders -     amLODipine (NORVASC) 10 MG tablet; Take 1 tablet (10 mg total) by mouth daily.      gabapentin (NEURONTIN) 300 MG capsule; Take 1 capsule (300 mg total) by mouth 3 (three) times daily for anxiety. -     losartan (COZAAR) 50 MG tablet; Take 1 tablet (50 mg total) by mouth daily.  -   Patient have been counseled extensively about nutrition and exercise. Other issues discussed during this visit include: Mcneil cholesterol diet, weight control and daily exercise, foot care, annual eye examinations at  Ophthalmology, importance of adherence with medications and regular follow-up. We also discussed long term complications of uncontrolled diabetes and hypertension.   Return in about 3 months (around 12/18/2023).  The patient was given clear instructions to go to ER or return to medical center if symptoms don't improve, worsen or new problems develop. The patient verbalized understanding. The patient was told to call to get lab results if they haven't heard anything in the next week.   This note has been created with Education officer, environmental. Any transcriptional errors are unintentional.   Grayce Sessions, NP 09/20/2023, 1:53 PM

## 2023-09-21 LAB — LIPID PANEL
Chol/HDL Ratio: 2.9 {ratio} (ref 0.0–5.0)
Cholesterol, Total: 138 mg/dL (ref 100–199)
HDL: 47 mg/dL (ref >39)
LDL Chol Calc (NIH): 75 mg/dL (ref 0–99)
Triglycerides: 81 mg/dL (ref 0–149)
VLDL Cholesterol Cal: 16 mg/dL (ref 5–40)

## 2023-09-21 LAB — CMP14+EGFR
ALT: 15 [IU]/L (ref 0–44)
AST: 14 [IU]/L (ref 0–40)
Albumin: 4.2 g/dL (ref 4.1–5.1)
Alkaline Phosphatase: 80 [IU]/L (ref 44–121)
BUN/Creatinine Ratio: 10 (ref 9–20)
BUN: 10 mg/dL (ref 6–24)
Bilirubin Total: 0.4 mg/dL (ref 0.0–1.2)
CO2: 19 mmol/L — ABNORMAL LOW (ref 20–29)
Calcium: 8.8 mg/dL (ref 8.7–10.2)
Chloride: 99 mmol/L (ref 96–106)
Creatinine, Ser: 1.05 mg/dL (ref 0.76–1.27)
Globulin, Total: 2.8 g/dL (ref 1.5–4.5)
Glucose: 259 mg/dL — ABNORMAL HIGH (ref 70–99)
Potassium: 4 mmol/L (ref 3.5–5.2)
Sodium: 135 mmol/L (ref 134–144)
Total Protein: 7 g/dL (ref 6.0–8.5)
eGFR: 90 mL/min/{1.73_m2} (ref >59)

## 2023-09-21 LAB — MICROALBUMIN / CREATININE URINE RATIO
Creatinine, Urine: 241.4 mg/dL
Microalb/Creat Ratio: 6 mg/g{creat} (ref 0–29)
Microalbumin, Urine: 13.6 ug/mL

## 2023-09-23 ENCOUNTER — Encounter (INDEPENDENT_AMBULATORY_CARE_PROVIDER_SITE_OTHER): Payer: Self-pay

## 2023-09-24 ENCOUNTER — Other Ambulatory Visit: Payer: Self-pay

## 2023-09-29 ENCOUNTER — Other Ambulatory Visit: Payer: Self-pay

## 2023-10-01 ENCOUNTER — Encounter (INDEPENDENT_AMBULATORY_CARE_PROVIDER_SITE_OTHER): Payer: Self-pay

## 2023-10-05 ENCOUNTER — Other Ambulatory Visit: Payer: Self-pay

## 2023-10-07 ENCOUNTER — Other Ambulatory Visit (HOSPITAL_BASED_OUTPATIENT_CLINIC_OR_DEPARTMENT_OTHER): Payer: Self-pay | Admitting: Pharmacist

## 2023-10-07 ENCOUNTER — Encounter: Payer: Self-pay | Admitting: Pharmacist

## 2023-10-07 DIAGNOSIS — Z91120 Patient's intentional underdosing of medication regimen due to financial hardship: Secondary | ICD-10-CM

## 2023-10-07 DIAGNOSIS — Z7984 Long term (current) use of oral hypoglycemic drugs: Secondary | ICD-10-CM

## 2023-10-07 DIAGNOSIS — Z794 Long term (current) use of insulin: Secondary | ICD-10-CM

## 2023-10-07 DIAGNOSIS — E1165 Type 2 diabetes mellitus with hyperglycemia: Secondary | ICD-10-CM

## 2023-10-07 NOTE — Progress Notes (Cosign Needed Addendum)
 Pharmacy TNM Diabetes Measure Review  S:  Patient was identified in a report as being at risk for failing the True [place] Metric of A1c control (<8%) in [place] and African American patients. Last A1c was 8.9 (09/20/23). Last PCP visit was 09/20/23.  Call placed to patient to discuss diabetes control and medication management. Patient has been seen by the clinical pharmacist with most recent visit 08/19/2023. I am scheduled to see him again on 10/18/23.  Current diabetes medications include: Lantus 26u daily, Novolog 6u BID before meals, metformin 500 mg XR BID. Patient reports he is behind on his insulin refills d/t lack of finances.   Insurance coverage: Aetna  Patient denies hypoglycemic events.  Reported home fasting blood sugars: not checking   Reported 2 hour post-meal/random blood sugars: not checking.  Patient reported dietary habits: none reported   Patient-reported exercise habits: none reported   O:   Lab Results  Component Value Date   HGBA1C 8.9 (A) 09/20/2023   There were no vitals filed for this visit.  Lipid Panel     Component Value Date/Time   CHOL 138 09/20/2023 1135   TRIG 81 09/20/2023 1135   HDL 47 09/20/2023 1135   CHOLHDL 2.9 09/20/2023 1135   LDLCALC 75 09/20/2023 1135    Clinical Atherosclerotic Cardiovascular Disease (ASCVD): No  The 10-year ASCVD risk score (Arnett DK, et al., 2019) is: 23.6%   Values used to calculate the score:     Age: 43 years     Sex: Male     Is Non-Hispanic African American: Yes     Diabetic: Yes     Tobacco smoker: Yes     Systolic Blood Pressure: 149 mmHg     Is BP treated: Yes     HDL Cholesterol: 47 mg/dL     Total Cholesterol: 138 mg/dL   Patient is participating in a Managed Medicaid Plan: No   A/P: Diabetes longstanding currently uncontrolled. Cost is a big barrier to him and copays are prohibitive. Patient is able to verbalize appropriate hypoglycemia management plan but is asymptomatic at this time. He sees me on  10/18/23 and I'll have him stop by the Medicaid office upstairs to apply for Medicaid. He is agreeable to this. -Continued current regimen.  -Patient educated on purpose, proper use, and potential adverse effects of insulin.  -Extensively discussed pathophysiology of diabetes, recommended lifestyle interventions, dietary effects on blood sugar control.  -Counseled on s/sx of and management of hypoglycemia.  -Next A1c anticipated 12/2023.   Follow-up:  Pharmacist 10/18/23.  Butch Penny, PharmD, Patsy [place], CPP Clinical Pharmacist [facility] [phone]

## 2023-10-17 NOTE — Progress Notes (Unsigned)
 S:     No chief complaint on file.  43 y.o. male who presents for diabetes evaluation, education, and management.  PMH is significant for T2DM w/ hx of DKA, HTN, bipolar I, tobacco use, alcohol use.  Patient was referred  by Primary Care Provider, Gwinda Passe, on 06/18/2023.  A1c was 8.8% at that visit. Michelle added [employer]. I saw him after on 07/01/2023 and instructed him on proper injection technique, however he was unale to afford continued refills of Trulicity d/t high copay. At last pharmacist visit on 08/27/23, pt reported taking Lantus consistently, but nonadherence with metformin and Novolog. Lantus was increased to 26u daily and he was instructed to continue metformin and Novolog as prescribed. At last PCP appt on 09/20/23, A1c was 8.9%, UACR WNL, and CMP unremarkable.  Patient was outreached via telephone on 10/07/23 and reported difficulty affording insulin. He was agreeable to applying for Medicaid at appointment today.   Today, patient arrives in good spirits and presents without any assistance.***  Family/Social History:  Fhx: no pertinent positives  Tobacco: current 0.5 PPD smoker Alcohol: 2 cans of beer weekly   Patient admits to non-adherence. Current diabetes medications include: Lantus 26u daily, Novolog 6u BID (not taking), metformin XR 500 mg BID (taking metformin once daily)*** Previous diabetes medications: Trulicity 0.75 mg weekly (not taking d/t high copay)  Insurance coverage: Aetna [employer]  Patient denies hypoglycemic events.***  Reported home post-prandial blood sugars: reports 160s-170s. No meter with him today. No CGM in place.***   Patient denies nocturia (nighttime urination). *** Patient denies neuropathy (nerve pain). Patient denies visual changes. Patient reports self foot exams.   Patient reported dietary habits: *** -Tries to adhere to a diabetic diet  Patient-reported exercise habits: none reported ***  O:  Lab Results  Component  Value Date   HGBA1C 8.9 (A) 09/20/2023   There were no vitals filed for this visit.  Lipid Panel     Component Value Date/Time   CHOL 138 09/20/2023 1135   TRIG 81 09/20/2023 1135   HDL 47 09/20/2023 1135   CHOLHDL 2.9 09/20/2023 1135   LDLCALC 75 09/20/2023 1135    Clinical Atherosclerotic Cardiovascular Disease (ASCVD): Yes  The 10-year ASCVD risk score (Arnett DK, et al., 2019) is: 23.6%   Values used to calculate the score:     Age: 72 years     Sex: Male     Is Non-Hispanic African American: Yes     Diabetic: Yes     Tobacco smoker: Yes     Systolic Blood Pressure: 149 mmHg     Is BP treated: Yes     HDL Cholesterol: 47 mg/dL     Total Cholesterol: 138 mg/dL   Apply for Medicaid - titrate insulin? F/u BP? - behind on refills  A/P: Diabetes longstanding currently uncontrolled. Patient is able to verbalize appropriate hypoglycemia management plan. Medication adherence appears suboptimal. -Continue metformin 500 mg XR. Counseled to take BID as prescribed instead of once daily.  -Increase Lantus to 26u once daily.  -Continue Novolog 6u BID before lunch and dinner.  -Extensively discussed pathophysiology of diabetes, recommended lifestyle interventions, dietary effects on blood sugar control.  -Counseled on s/sx of and management of hypoglycemia.  -Next A1c anticipated 09/2023.  Written patient instructions provided. Patient verbalized understanding of treatment plan. Total time in face to face counseling 15 minutes.    Follow-up:  Pharmacist in 1 mo, *** PCP 12/20/23  Nils Pyle, PharmD PGY1 Pharmacy Resident  Butch Penny, PharmD, Patsy [place], CPP Clinical Pharmacist [facility] [phone]

## 2023-10-18 ENCOUNTER — Encounter: Payer: Self-pay | Admitting: Pharmacist

## 2023-10-18 ENCOUNTER — Other Ambulatory Visit: Payer: Self-pay

## 2023-10-18 ENCOUNTER — Ambulatory Visit: Payer: Self-pay | Attending: Primary Care | Admitting: Pharmacist

## 2023-10-18 VITALS — BP 123/83

## 2023-10-18 DIAGNOSIS — Z760 Encounter for issue of repeat prescription: Secondary | ICD-10-CM

## 2023-10-18 DIAGNOSIS — I10 Essential (primary) hypertension: Secondary | ICD-10-CM

## 2023-10-18 DIAGNOSIS — Z7984 Long term (current) use of oral hypoglycemic drugs: Secondary | ICD-10-CM | POA: Diagnosis not present

## 2023-10-18 DIAGNOSIS — E1169 Type 2 diabetes mellitus with other specified complication: Secondary | ICD-10-CM

## 2023-10-18 DIAGNOSIS — Z91141 Patient's other noncompliance with medication regimen due to financial hardship: Secondary | ICD-10-CM

## 2023-10-18 DIAGNOSIS — Z794 Long term (current) use of insulin: Secondary | ICD-10-CM

## 2023-10-18 DIAGNOSIS — E785 Hyperlipidemia, unspecified: Secondary | ICD-10-CM

## 2023-10-18 MED ORDER — AMLODIPINE BESYLATE 10 MG PO TABS
10.0000 mg | ORAL_TABLET | Freq: Every day | ORAL | 3 refills | Status: AC
  Filled 2023-10-18: qty 90, 90d supply, fill #0
  Filled 2023-12-31: qty 90, 90d supply, fill #1
  Filled 2024-04-24: qty 90, 90d supply, fill #2
  Filled 2024-07-14 – 2024-07-28 (×3): qty 90, 90d supply, fill #3

## 2023-10-18 MED ORDER — LOSARTAN POTASSIUM 50 MG PO TABS
50.0000 mg | ORAL_TABLET | Freq: Every day | ORAL | 3 refills | Status: AC
  Filled 2023-10-18: qty 90, 90d supply, fill #0
  Filled 2023-12-31: qty 90, 90d supply, fill #1
  Filled 2024-01-27: qty 30, 30d supply, fill #2
  Filled 2024-04-24: qty 30, 30d supply, fill #3
  Filled 2024-05-22: qty 30, 30d supply, fill #4
  Filled 2024-07-07: qty 30, 30d supply, fill #5
  Filled 2024-09-08: qty 30, 30d supply, fill #6

## 2023-10-18 MED ORDER — INSULIN LISPRO (1 UNIT DIAL) 100 UNIT/ML (KWIKPEN)
PEN_INJECTOR | SUBCUTANEOUS | 6 refills | Status: DC
  Filled 2023-10-18: qty 6, 30d supply, fill #0
  Filled 2023-11-17: qty 6, 30d supply, fill #1
  Filled 2023-12-31: qty 6, 30d supply, fill #2
  Filled 2024-03-10: qty 6, 30d supply, fill #3
  Filled 2024-05-08: qty 6, 30d supply, fill #4

## 2023-10-18 MED ORDER — METFORMIN HCL ER 500 MG PO TB24
1000.0000 mg | ORAL_TABLET | Freq: Two times a day (BID) | ORAL | 3 refills | Status: AC
  Filled 2023-10-18: qty 360, 90d supply, fill #0
  Filled 2024-01-27: qty 360, 90d supply, fill #1

## 2023-10-18 MED ORDER — INSULIN LISPRO 100 UNIT/ML IJ SOLN
6.0000 [IU] | Freq: Two times a day (BID) | INTRAMUSCULAR | 11 refills | Status: DC
Start: 2023-10-18 — End: 2023-10-18
  Filled 2023-10-18: qty 10, 30d supply, fill #0

## 2023-10-19 ENCOUNTER — Other Ambulatory Visit: Payer: Self-pay

## 2023-10-27 ENCOUNTER — Other Ambulatory Visit: Payer: Self-pay

## 2023-11-17 ENCOUNTER — Other Ambulatory Visit: Payer: Self-pay

## 2023-11-18 ENCOUNTER — Ambulatory Visit: Payer: MEDICAID | Admitting: Pharmacist

## 2023-11-19 ENCOUNTER — Other Ambulatory Visit: Payer: Self-pay | Admitting: Family Medicine

## 2023-11-19 ENCOUNTER — Other Ambulatory Visit: Payer: Self-pay

## 2023-11-19 ENCOUNTER — Other Ambulatory Visit: Payer: Self-pay | Admitting: Pharmacist

## 2023-11-19 DIAGNOSIS — E1169 Type 2 diabetes mellitus with other specified complication: Secondary | ICD-10-CM

## 2023-11-19 MED ORDER — ACCU-CHEK GUIDE W/DEVICE KIT
PACK | 0 refills | Status: AC
Start: 2023-11-19 — End: ?
  Filled 2023-11-19: qty 1, 30d supply, fill #0

## 2023-11-19 MED ORDER — ACCU-CHEK GUIDE TEST VI STRP
ORAL_STRIP | 6 refills | Status: AC
  Filled 2023-11-19: qty 100, 33d supply, fill #0
  Filled 2023-12-31: qty 100, 33d supply, fill #1
  Filled 2024-03-21: qty 100, 33d supply, fill #2
  Filled 2024-04-24: qty 100, 33d supply, fill #3
  Filled 2024-07-28 (×2): qty 100, 33d supply, fill #4

## 2023-11-19 MED ORDER — TRUE METRIX BLOOD GLUCOSE TEST VI STRP
ORAL_STRIP | 11 refills | Status: DC
  Filled 2023-11-19: qty 100, fill #0

## 2023-11-19 MED ORDER — ACCU-CHEK SOFTCLIX LANCETS MISC
6 refills | Status: AC
  Filled 2023-11-19: qty 100, 33d supply, fill #0
  Filled 2023-12-31: qty 100, 33d supply, fill #1
  Filled 2024-04-24: qty 100, 33d supply, fill #2
  Filled 2024-07-28: qty 100, 33d supply, fill #3

## 2023-11-26 ENCOUNTER — Other Ambulatory Visit: Payer: Self-pay

## 2023-12-07 ENCOUNTER — Other Ambulatory Visit: Payer: Self-pay

## 2023-12-08 ENCOUNTER — Other Ambulatory Visit: Payer: Self-pay

## 2023-12-09 ENCOUNTER — Other Ambulatory Visit: Payer: Self-pay
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
## 2023-12-20 ENCOUNTER — Ambulatory Visit (INDEPENDENT_AMBULATORY_CARE_PROVIDER_SITE_OTHER): Payer: 59 | Admitting: Primary Care

## 2023-12-24 ENCOUNTER — Ambulatory Visit: Payer: MEDICAID | Admitting: Pharmacist

## 2023-12-24 ENCOUNTER — Telehealth: Payer: Self-pay | Admitting: Primary Care

## 2023-12-24 NOTE — Progress Notes (Unsigned)
 S:     No chief complaint on file.  43 y.o. male who presents for diabetes evaluation, education, and management.   PMH is significant for T2DM w/ hx of DKA, HTN, bipolar I, tobacco use, alcohol  use.  Last visit with pharmacist on 10/18/2023 where patient reported that he ran out of his medications and had been taking old supplies of metformin  1000 mg and used his uncle's Lantus  to inject 25 U. Patient reported to participating in clinical trials at [facility] and recalled recent A1c of 8%. Reported home blood FBG 304 prior to Lantus  and 170 after taking Lantus . Endorsed worsened hyperglycemia symptoms and patient applied to Medicaid at this appointment. He was continued on metformin  XR 1000 mg PO BID and to start Humalog  6 U with breakfast and dinner. Advised to continue Lantus  26 U daily until he ran out.   Today, patient presents for diabetes management and evaluation.   ***adherence to regimen  ***symptoms of hyperglycemia:  ***nocturia (night urination)  ***neuropathy (bottom L foot numbness and L hand numbness)  ***blurry vision  Patient repots self foot exams. Endorses some polydipsia  ***hypoglycemia   ***home BS readings:  ***range:  ***ever go above 200 mg/dL  Reported home post-prandial blood sugars: reports FBG 304 today prior to taking Lantus . Friday (2/28) was 170 - last day he had Lantus . Tries to check twice daily - 10AM, 8PM. Denies BG < 100.   Family/Social History:  Fhx: no pertinent positives  Tobacco: current 0.5 PPD smoker Alcohol : 2 cans of beer weekly   Current diabetes medications include: Lantus  26u daily, metformin  1000 mg PO BID, and Humalog  6 U with breakfast and lunch  Previous diabetes medications: Trulicity  0.75 mg weekly (not taking d/t high copay)  Insurance coverage: [employer]   Patient reported dietary habits: 3 meals/day -Tries to adhere to a diabetic diet - Endorses meals with large servings of carbohydrates like  spaghetti  Patient-reported exercise habits: none reported - walking throughout the day  O:  Lab Results  Component Value Date   HGBA1C 8.9 (A) 09/20/2023   There were no vitals filed for this visit.  Lipid Panel     Component Value Date/Time   CHOL 138 09/20/2023 1135   TRIG 81 09/20/2023 1135   HDL 47 09/20/2023 1135   CHOLHDL 2.9 09/20/2023 1135   LDLCALC 75 09/20/2023 1135    Clinical Atherosclerotic Cardiovascular Disease (ASCVD): Yes  The 10-year ASCVD risk score (Arnett DK, et al., 2019) is: 17.9%   Values used to calculate the score:     Age: 1 years     Sex: Male     Is Non-Hispanic African American: Yes     Diabetic: Yes     Tobacco smoker: Yes     Systolic Blood Pressure: 123 mmHg     Is BP treated: Yes     HDL Cholesterol: 47 mg/dL     Total Cholesterol: 138 mg/dL    A/P: Diabetes longstanding currently uncontrolled with last A1c of 8.9% above goal < 7%. Patient experiences adherence and barriers to medications due to cost. Plan to assess patient's ability to continue basal insulin  and start GLP-1 RA dependent on insurance coverage. Patient is able to verbalize appropriate hypoglycemia management plan. Medication adherence appears suboptimal. -Continue metformin  XR 1000 mg PO BID - Increase Humalog  10 units BID with breakfast and dinner.  -Coninue Lantus  to 26u once daily until supply runs out - Recommend to check BG fasting and prior to taking  Humalog  with meals.  -Extensively discussed pathophysiology of diabetes, recommended lifestyle interventions, dietary effects on blood sugar control.  -Counseled on s/sx of and management of hypoglycemia.  -Next A1c anticipated 03/2024.  Hypertension: - Currently uncontrolled just above goal < 130/80 mmHg.  - Reviewed long term cardiovascular and renal outcomes of uncontrolled blood pressure - Reviewed appropriate blood pressure monitoring technique and reviewed goal blood pressure. Recommended to check home blood  pressure and heart rate  - ***continue amlodipine  10 mg PO daily, losartan  50 mg PO daily   Written patient instructions provided. Patient verbalized understanding of treatment plan. Total time in face to face counseling 60 minutes.    Follow-up:  ***Pharmacist in 1 mo: 01/2024  Georges Kings  PharmD Candidate  Class of 2026 [employer]  ***

## 2023-12-24 NOTE — Telephone Encounter (Signed)
 Called & spoke to the patient. Verified name & DOB. Patient appointment rescheduled to 01/27/2024 at 10:30 am. Patient confirmed appointment and had no further questions.  Copied from CRM [id number]. Topic: Appointments - Appointment Cancel/Reschedule >> Dec 24, 2023  9:20 AM Brandon Mcneil wrote: Patient/patient representative is calling to cancel or reschedule an appointment. Refer to attachments for appointment information. Patient called stated he does not have transportation and will need to reschedule his appt for today at 10:30am. Please f/u with patient

## 2023-12-29 ENCOUNTER — Encounter (INDEPENDENT_AMBULATORY_CARE_PROVIDER_SITE_OTHER): Payer: Self-pay | Admitting: Ophthalmology

## 2023-12-29 ENCOUNTER — Ambulatory Visit (INDEPENDENT_AMBULATORY_CARE_PROVIDER_SITE_OTHER): Payer: MEDICAID | Admitting: Ophthalmology

## 2023-12-29 VITALS — BP 114/80 | HR 67

## 2023-12-29 DIAGNOSIS — I10 Essential (primary) hypertension: Secondary | ICD-10-CM

## 2023-12-29 DIAGNOSIS — H25013 Cortical age-related cataract, bilateral: Secondary | ICD-10-CM

## 2023-12-29 DIAGNOSIS — E113393 Type 2 diabetes mellitus with moderate nonproliferative diabetic retinopathy without macular edema, bilateral: Secondary | ICD-10-CM | POA: Diagnosis not present

## 2023-12-29 DIAGNOSIS — Z7984 Long term (current) use of oral hypoglycemic drugs: Secondary | ICD-10-CM

## 2023-12-29 DIAGNOSIS — Z794 Long term (current) use of insulin: Secondary | ICD-10-CM | POA: Diagnosis not present

## 2023-12-29 DIAGNOSIS — E119 Type 2 diabetes mellitus without complications: Secondary | ICD-10-CM

## 2023-12-29 DIAGNOSIS — H3581 Retinal edema: Secondary | ICD-10-CM

## 2023-12-29 DIAGNOSIS — H35033 Hypertensive retinopathy, bilateral: Secondary | ICD-10-CM

## 2023-12-31 ENCOUNTER — Other Ambulatory Visit: Payer: Self-pay

## 2024-01-02 ENCOUNTER — Encounter (INDEPENDENT_AMBULATORY_CARE_PROVIDER_SITE_OTHER): Payer: Self-pay | Admitting: Ophthalmology

## 2024-01-04 ENCOUNTER — Other Ambulatory Visit: Payer: Self-pay

## 2024-01-26 NOTE — Progress Notes (Signed)
 S:     No chief complaint on file.  43 y.o. male who presents for diabetes evaluation, education, and management.  PMH is significant for T2DM w/ hx of DKA, HTN, bipolar I, tobacco use, alcohol  use.  Patient was referred  by Primary Care Provider, Madelyn Schick, on 06/18/2023.  A1c was 8.8% at that visit. Michelle added Trulicity . I saw him after on 07/01/2023 and instructed him on proper injection technique, however he was unale to afford continued refills of Trulicity  d/t high copay. At last pharmacist visit on 08/27/23, pt reported taking Lantus  consistently, but nonadherence with metformin  and Novolog . Lantus  was increased to 26u daily and he was instructed to continue metformin  and Novolog  as prescribed. At PCP appt on 09/20/23, A1c was 8.9%, UACR WNL, and CMP unremarkable. At last visit with pharmacy on 10/18/23, meal time insulin  was changed to Humalog , he was instructed to use Lantus  supply until ran out, and continued on metformin .  Today, patient arrives in good spirits and presents without any assistance. He reports that he has been able to get all medications since he got insurance. Reports no missed doses of Humalog  and metformin , but did run out of losartan  2 days ago. Did not take BP medications before coming in today and reports smoking a cigarette ~30 minutes prior to checking BP today. 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  Family/Social History:  Fhx: no pertinent positives  Tobacco: current 0.5 PPD smoker Alcohol : 2 cans of beer weekly   Patient admits to non-adherence due to running out of medications.   Current diabetes medications include: metformin  1000 mg PO BID (taking 500 mg BID), Humalog  6 u BID   Previous diabetes medications: Trulicity  0.75 mg weekly (not taking d/t high copay), Lantus  (stopped d/t cost), Novolog  (changed d/t insurance preference)   Current hypertension  medications include: amlodipine  10 mg daily, losartan  50 mg daily  Insurance coverage: [employer]  Patient denies hypoglycemic events.   Reported home AM BG: 215-226 mg/dL  Reported home post-prandial blood sugars: 210-230s mg/dL (down from 034-742 mg/dL) Denies BG < 595 or > 638 mg/dL.   Patient endorses nocturia (nighttime urination) - improved, still getting up 2x/night. Patient denies neuropathy (nerve pain) -- improved. Patient denies visual changes- improved, saw eye doctor last month. Patient repots self foot exams.  Improved polydipsia.   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  Lipid Panel     Component Value Date/Time   CHOL 138 09/20/2023 1135   TRIG 81 09/20/2023 1135   HDL 47 09/20/2023 1135   CHOLHDL 2.9 09/20/2023 1135   LDLCALC 75 09/20/2023 1135    Clinical Atherosclerotic Cardiovascular Disease (ASCVD): Yes  The 10-year ASCVD risk score (Arnett DK, et al., 2019) is: 15.7%   Values used to calculate the score:     Age: 43 years     Sex: Male     Is Non-Hispanic African American: Yes     Diabetic: Yes     Tobacco smoker: Yes     Systolic Blood Pressure: 114 mmHg  Is BP treated: Yes     HDL Cholesterol: 47 mg/dL     Total Cholesterol: 138 mg/dL    A/P: Diabetes longstanding currently uncontrolled with last A1c of 8.9% above goal < 7%. Control is suboptimal due to significant financial barriers, however patient now has Medicaid and is motivated to optimize regimen. Patient amenable to restarting long-acting insulin  and staring approval process for GLP-1 and CGM. Patient is able to verbalize appropriate hypoglycemia management  plan. Medication adherence appears okay.  -Continue metformin  XR 1000 mg PO BID. Encouraged to take 2 tablets two times a day.  -START Humalog  6 units BID with breakfast and dinner. -START Lantus  10 units once daily in the morning  -Have started the PA process for both Ozempic  and Freestyle Libre 3+. Will notify him when he is able to obtain these after PA approval.  -START Ozempic  0.25 mg once weekly when PA is approved - Recommend to check BG fasting and prior to taking Humalog  with meals.  -Extensively discussed pathophysiology of diabetes, recommended lifestyle interventions, dietary effects on blood sugar control.  -Counseled on s/sx of and management of hypoglycemia.  -Next A1c anticipated 04/2024.   Hypertension: - Currently uncontrolled just above goal < 130/80 mmHg, however did not take medications this morning prior to appointment. - Reviewed long term cardiovascular and renal outcomes of uncontrolled blood pressure - Reviewed appropriate blood pressure monitoring technique and reviewed goal blood pressure. Recommended to check home blood pressure and heart rate  - Continue amlodipine  10 mg PO daily and losartan  50 mg PO daily   ASCVD: Patient asking about resuming cholesterol medication. Primary prevention, ASCVD risk of 15.8% due to HTN and DM. High intensity statin indicated. -START rosuvastain 20 mg daily  Written patient instructions provided. Patient verbalized understanding of treatment plan. Total time in face to face counseling 45 minutes.    Follow-up:  Pharmacist in 1 month PCP: not scheduled  Juleen Oakland, PharmD PGY1 Pharmacy Resident

## 2024-01-27 ENCOUNTER — Telehealth: Payer: Self-pay | Admitting: Pharmacist

## 2024-01-27 ENCOUNTER — Encounter: Payer: Self-pay | Admitting: Pharmacist

## 2024-01-27 ENCOUNTER — Other Ambulatory Visit: Payer: Self-pay

## 2024-01-27 ENCOUNTER — Ambulatory Visit: Payer: MEDICAID | Attending: Primary Care | Admitting: Pharmacist

## 2024-01-27 ENCOUNTER — Telehealth (INDEPENDENT_AMBULATORY_CARE_PROVIDER_SITE_OTHER): Payer: Self-pay | Admitting: Primary Care

## 2024-01-27 VITALS — BP 136/86 | HR 67 | Wt 231.0 lb

## 2024-01-27 DIAGNOSIS — E7800 Pure hypercholesterolemia, unspecified: Secondary | ICD-10-CM

## 2024-01-27 DIAGNOSIS — E1165 Type 2 diabetes mellitus with hyperglycemia: Secondary | ICD-10-CM | POA: Diagnosis not present

## 2024-01-27 DIAGNOSIS — Z794 Long term (current) use of insulin: Secondary | ICD-10-CM | POA: Diagnosis not present

## 2024-01-27 DIAGNOSIS — Z7985 Long-term (current) use of injectable non-insulin antidiabetic drugs: Secondary | ICD-10-CM

## 2024-01-27 DIAGNOSIS — Z7984 Long term (current) use of oral hypoglycemic drugs: Secondary | ICD-10-CM | POA: Diagnosis not present

## 2024-01-27 DIAGNOSIS — I10 Essential (primary) hypertension: Secondary | ICD-10-CM | POA: Diagnosis not present

## 2024-01-27 LAB — POCT GLYCOSYLATED HEMOGLOBIN (HGB A1C): HbA1c, POC (controlled diabetic range): 8.7 % — AB (ref 0.0–7.0)

## 2024-01-27 MED ORDER — LANTUS SOLOSTAR 100 UNIT/ML ~~LOC~~ SOPN
10.0000 [IU] | PEN_INJECTOR | Freq: Every day | SUBCUTANEOUS | 2 refills | Status: DC
  Filled 2024-01-27: qty 3, 28d supply, fill #0
  Filled 2024-03-10: qty 3, 28d supply, fill #1
  Filled 2024-04-24: qty 3, 28d supply, fill #2

## 2024-01-27 MED ORDER — FREESTYLE LIBRE 3 READER DEVI
0 refills | Status: AC
  Filled 2024-01-27: qty 1, 30d supply, fill #0

## 2024-01-27 MED ORDER — OZEMPIC (0.25 OR 0.5 MG/DOSE) 2 MG/3ML ~~LOC~~ SOPN
0.2500 mg | PEN_INJECTOR | SUBCUTANEOUS | 1 refills | Status: DC
  Filled 2024-01-27: qty 3, 28d supply, fill #0

## 2024-01-27 MED ORDER — FREESTYLE LIBRE 3 PLUS SENSOR MISC
6 refills | Status: AC
  Filled 2024-01-27: qty 2, 30d supply, fill #0
  Filled 2024-05-16 – 2024-05-30 (×2): qty 2, 30d supply, fill #1
  Filled 2024-07-03: qty 2, 30d supply, fill #2
  Filled 2024-07-28 (×2): qty 2, 30d supply, fill #3
  Filled 2024-09-08: qty 2, 30d supply, fill #4

## 2024-01-27 MED ORDER — ROSUVASTATIN CALCIUM 20 MG PO TABS
20.0000 mg | ORAL_TABLET | Freq: Every day | ORAL | 2 refills | Status: AC
  Filled 2024-01-27: qty 90, 90d supply, fill #0
  Filled 2024-04-24: qty 90, 90d supply, fill #1
  Filled 2024-07-28: qty 90, 90d supply, fill #2

## 2024-01-27 NOTE — Telephone Encounter (Signed)
 Hey friend,   We are hoping to start this patient on Ozempic . He is on insulin  (two kinds). We are hoping to get [place] 3 plus approved as well. Are you able to submit PA requests for his Ozempic  and Libre 3 plus supplies? Just in case Ozempic  PA is denied, we talked to him about Trulicity  and he is agreeable to this if Ozempic  doesn't get approved. Will you let me know if Ozempic  is denied and we can send for Trulicity  instead.

## 2024-01-27 NOTE — Telephone Encounter (Signed)
 Called pt to schedule an appt with Dr Denece Finger. Please call us  so that we can schedule appt, when pt returns call. Thank you

## 2024-01-27 NOTE — Telephone Encounter (Signed)
 Hey friends,   This patient is due a PCP visit, but he has not scheduled one. I'm messaging because he is also requesting antibiotics. He has recurrent boils on his buttocks and posterior bilateral thighs that he commonly gets antibiotics for. I cannot refill or prescribe antibiotics per protocol. However, I told him I would make you aware and we would try to schedule a PCP appointment.

## 2024-01-28 ENCOUNTER — Other Ambulatory Visit (HOSPITAL_COMMUNITY): Payer: Self-pay

## 2024-01-29 ENCOUNTER — Other Ambulatory Visit: Payer: Self-pay

## 2024-01-31 ENCOUNTER — Telehealth: Payer: Self-pay

## 2024-01-31 ENCOUNTER — Other Ambulatory Visit (INDEPENDENT_AMBULATORY_CARE_PROVIDER_SITE_OTHER): Payer: Self-pay | Admitting: Primary Care

## 2024-01-31 ENCOUNTER — Other Ambulatory Visit (HOSPITAL_COMMUNITY): Payer: Self-pay

## 2024-01-31 ENCOUNTER — Other Ambulatory Visit: Payer: Self-pay

## 2024-01-31 DIAGNOSIS — L0292 Furuncle, unspecified: Secondary | ICD-10-CM

## 2024-01-31 MED ORDER — DOXYCYCLINE HYCLATE 100 MG PO TABS
100.0000 mg | ORAL_TABLET | Freq: Two times a day (BID) | ORAL | 0 refills | Status: AC
  Filled 2024-01-31: qty 14, 7d supply, fill #0

## 2024-01-31 NOTE — Telephone Encounter (Signed)
 Pharmacy Patient Advocate Encounter   Received notification from Physician's Office that prior authorization for OZEMPIC  0.25/0.5MG  is required/requested.   Insurance verification completed.   The patient is insured through [facility] .   Per test claim: PA required; PA submitted to above mentioned insurance via CoverMyMeds Key/confirmation #/EOC [id number]. Status is pending

## 2024-01-31 NOTE — Telephone Encounter (Signed)
 Pharmacy Patient Advocate Encounter  Received notification from [facility] that Prior Authorization for OZEMPIC  0.25/0.5MG  has been APPROVED from 01/31/24 to 01/30/25

## 2024-01-31 NOTE — Telephone Encounter (Signed)
 Pharmacy Patient Advocate Encounter  Received notification from [employer] that Prior Authorization for FREESTYLE LIBRE 3 PLUS SENSOR has been APPROVED from 01/31/24 to 08/01/24

## 2024-01-31 NOTE — Telephone Encounter (Signed)
 Pharmacy Patient Advocate Encounter   Received notification from Physician's Office that prior authorization for FreeStyle [place] 3 Reader device is required/requested.   Insurance verification completed.   The patient is insured through [facility] .   Per test claim: PA required; PA submitted to above mentioned insurance via CoverMyMeds Key/confirmation #/EOC [id number]. Status is pending

## 2024-01-31 NOTE — Telephone Encounter (Signed)
 Thank you so much, friend. Routing to [place] as well so she's aware.   Loetta Ringer, can we get these ready for him? He fills here at our pharmacy.

## 2024-01-31 NOTE — Telephone Encounter (Signed)
 Pharmacy Patient Advocate Encounter  Received notification from [facility] that Prior Authorization for FREESTYLE LIBRE 3 READER has been APPROVED from 01/31/24 to 01/30/25

## 2024-01-31 NOTE — Telephone Encounter (Signed)
 Pharmacy Patient Advocate Encounter   Received notification from Physician's Office that prior authorization for FREESTYLE LIBRE 3 PLUS SENSORS is required/requested.   Insurance verification completed.   The patient is insured through [facility] .   PA required; PA submitted to above mentioned insurance via CoverMyMeds Key/confirmation #/EOC [id number]. Status is pending

## 2024-02-01 ENCOUNTER — Other Ambulatory Visit: Payer: Self-pay

## 2024-02-02 ENCOUNTER — Telehealth (INDEPENDENT_AMBULATORY_CARE_PROVIDER_SITE_OTHER): Payer: Self-pay | Admitting: Primary Care

## 2024-02-02 NOTE — Telephone Encounter (Signed)
 Left VM with pt about their upcoming appt.

## 2024-02-03 ENCOUNTER — Ambulatory Visit (INDEPENDENT_AMBULATORY_CARE_PROVIDER_SITE_OTHER): Payer: MEDICAID | Admitting: Primary Care

## 2024-02-04 ENCOUNTER — Other Ambulatory Visit: Payer: Self-pay

## 2024-02-14 ENCOUNTER — Telehealth (INDEPENDENT_AMBULATORY_CARE_PROVIDER_SITE_OTHER): Payer: Self-pay | Admitting: Primary Care

## 2024-02-14 NOTE — Telephone Encounter (Signed)
 Spoke to pt about upcoming appt.. Will be present

## 2024-02-21 ENCOUNTER — Encounter (INDEPENDENT_AMBULATORY_CARE_PROVIDER_SITE_OTHER): Payer: Self-pay | Admitting: Primary Care

## 2024-02-21 ENCOUNTER — Ambulatory Visit (INDEPENDENT_AMBULATORY_CARE_PROVIDER_SITE_OTHER): Payer: MEDICAID | Admitting: Primary Care

## 2024-02-21 VITALS — BP 146/92 | HR 83 | Resp 16 | Wt 227.4 lb

## 2024-02-21 DIAGNOSIS — I10 Essential (primary) hypertension: Secondary | ICD-10-CM | POA: Diagnosis not present

## 2024-02-21 DIAGNOSIS — E7800 Pure hypercholesterolemia, unspecified: Secondary | ICD-10-CM

## 2024-02-21 DIAGNOSIS — E119 Type 2 diabetes mellitus without complications: Secondary | ICD-10-CM | POA: Diagnosis not present

## 2024-02-21 DIAGNOSIS — Z1211 Encounter for screening for malignant neoplasm of colon: Secondary | ICD-10-CM | POA: Diagnosis not present

## 2024-02-21 DIAGNOSIS — Z794 Long term (current) use of insulin: Secondary | ICD-10-CM

## 2024-02-21 NOTE — Addendum Note (Signed)
 Addended by: CASIMIR JUVENAL SAUNDERS on: 02/21/2024 10:31 AM   Modules accepted: Orders

## 2024-02-21 NOTE — Progress Notes (Signed)
 [facility]  Brandon Mcneil, is a 43 y.o. male  RDW:[id number]  FMW:[id number]  DOB - 12-Jan-1981  Chief Complaint  Patient presents with   Diabetes   Hypertension       Subjective:   Brandon Mcneil is a 43 y.o. male here today for a follow up visit of health maintenance . Bp elevated admitted to forgetting to take it.  Patient has No headache, No chest pain, No abdominal pain - No Nausea, No new weakness tingling or numbness, No Cough - shortness of breath  No problems updated.  Comprehensive ROS Pertinent positive and negative noted in HPI   Allergies  Allergen Reactions   Lisinopril  Swelling and Other (See Comments)    Lips and mouth became swollen   Penicillins Anaphylaxis, Itching, Swelling and Rash   Ibuprofen  Other (See Comments)    Was told to not take this (because of a prior ulcer)   Tomato Hives and Other (See Comments)    Certain tomato sauces break out the lips in HIVES    Past Medical History:  Diagnosis Date   Abscess, perirectal 08/12/2020   Bipolar I disorder, most recent episode depressed (HCC) 09/11/2013   Depression    Essential hypertension 02/03/2017   Hypercholesteremia    Lumbar foraminal stenosis 01/06/2023   Poorly controlled diabetes mellitus (HCC) 01/06/2023   Tobacco use disorder 09/11/2013    Current Outpatient Medications on File Prior to Visit  Medication Sig Dispense Refill   Accu-Chek Softclix Lancets lancets Use to check blood sugar 3 times daily. 100 each 6   acetaminophen  (TYLENOL ) 325 MG tablet Take 2 tablets (650 mg total) by mouth in the morning and at bedtime. (Patient taking differently: Take 325 mg by mouth every 6 (six) hours as needed for mild pain (pain score 1-3) or headache.) 60 tablet 0   amLODipine  (NORVASC ) 10 MG tablet Take 1 tablet (10 mg total) by mouth daily. 90 tablet 3   blood glucose meter kit and supplies KIT Dispense based on patient and insurance preference. Use up to four times daily as  directed. (FOR ICD-9 250.00, 250.01). 1 each 0   Blood Glucose Monitoring Suppl (ACCU-CHEK GUIDE) w/Device KIT Use to check blood sugar 3 times daily. 1 kit 0   Continuous Glucose Receiver (FREESTYLE LIBRE 3 READER) DEVI Use to check blood sugar continuously. 1 each 0   Continuous Glucose Sensor (FREESTYLE LIBRE 3 PLUS SENSOR) MISC Change sensor every 15 days. Use to check blood sugar continuously. 2 each 6   doxycycline  (VIBRA -TABS) 100 MG tablet Take 1 tablet (100 mg total) by mouth 2 (two) times daily. 14 tablet 0   gabapentin  (NEURONTIN ) 300 MG capsule Take 1 capsule (300 mg total) by mouth 3 (three) times daily for anxiety. (Patient not taking: Reported on 10/18/2023) 180 capsule 1   glucose blood (ACCU-CHEK GUIDE TEST) test strip Use to check blood sugar 3 times daily. 100 each 6   hydrocortisone  cream 0.5 % Apply 1 Application topically 2 (two) times daily. Apply around neck 30 g 0   insulin  glargine (LANTUS  SOLOSTAR) 100 UNIT/ML Solostar Pen Inject 10 Units into the skin daily. 3 mL 2   insulin  lispro (HUMALOG  KWIKPEN) 100 UNIT/ML KwikPen Inject 0.06 mLs (6 Units total) into the skin 2 (two) times daily with a meal. May increase up to 10 units twice daily as directed by your provider. 15 mL 6   Insulin  Pen Needle (TRUEPLUS 5-BEVEL PEN NEEDLES) 32G X 4 MM MISC Use to inject  Basaglar  once daily. 100 each 2   losartan  (COZAAR ) 50 MG tablet Take 1 tablet (50 mg total) by mouth daily. 90 tablet 3   metFORMIN  (GLUCOPHAGE -XR) 500 MG 24 hr tablet Take 2 tablets (1,000 mg total) by mouth 2 (two) times daily with a meal. 360 tablet 3   naltrexone  (DEPADE) 50 MG tablet Take one tablet daily at the same time every day for alcohol  (Patient not taking: Reported on 10/18/2023) 60 tablet 0   naltrexone  (DEPADE) 50 MG tablet Take 1 tablet (50 mg total) by mouth daily for alcohol . (Patient not taking: Reported on 10/18/2023) 60 tablet 0   naltrexone  (DEPADE) 50 MG tablet Take 1 tablet (50 mg total) by mouth daily at  the same time every day for alcohol  (Patient not taking: Reported on 10/18/2023) 60 tablet 0   polyethylene glycol (MIRALAX  / GLYCOLAX ) 17 g packet Take 17 g by mouth 2 (two) times daily. 14 each 0   rosuvastatin  (CRESTOR ) 20 MG tablet Take 1 tablet (20 mg total) by mouth daily. 90 tablet 2   Semaglutide ,0.25 or 0.5MG /DOS, (OZEMPIC , 0.25 OR 0.5 MG/DOSE,) 2 MG/3ML SOPN Inject 0.25 mg into the skin once a week. For 4 weeks. Then, increase to 0.5 mg weekly. 3 mL 1   traZODone  (DESYREL ) 50 MG tablet Take 1-2 tablets by mouth at bedtime as needed for sleep (Patient not taking: Reported on 10/18/2023) 60 tablet 0   traZODone  (DESYREL ) 50 MG tablet Take 1-2 tablets (50-100 mg total) by mouth daily as needed at bedtime for sleep. (Patient not taking: Reported on 10/18/2023) 60 tablet 0   traZODone  (DESYREL ) 50 MG tablet Take 1-2 tablets (50-100 mg total) by mouth at bedtime for sleep. (Patient not taking: Reported on 10/18/2023) 120 tablet 0   witch hazel-glycerin  (TUCKS) pad Apply topically as needed for irritation or hemorrhoids. 40 each 0   No current facility-administered medications on file prior to visit.   Health Maintenance  Topic Date Due   Eye exam for diabetics  Never done   Hepatitis B Vaccine (1 of 3 - 19+ 3-dose series) Never done   HPV Vaccine (1 - Risk 3-dose SCDM series) Never done   Pneumococcal Vaccination (2 of 2 - PCV) 06/15/2017   COVID-19 Vaccine (1 - 2024-25 season) Never done   Colon Cancer Screening  Never done   Flu Shot  03/17/2024   Complete foot exam   06/17/2024   Hemoglobin A1C  07/28/2024   Yearly kidney function blood test for diabetes  09/19/2024   Yearly kidney health urinalysis for diabetes  09/19/2024   DTaP/Tdap/Td vaccine (3 - Td or Tdap) 06/15/2026   HIV Screening  Completed   Meningitis B Vaccine  Aged Out   Hepatitis C Screening  Discontinued    Objective:   Vitals:   02/21/24 1006 02/21/24 1007  BP: (!) 134/96 (!) 146/92  Pulse: 83   Resp: 16   SpO2:  99%   Weight: 227 lb 6.4 oz (103.1 kg)    BP Readings from Last 3 Encounters:  02/21/24 (!) 146/92  01/27/24 136/86  12/29/23 114/80      Physical Exam Vitals reviewed.  Constitutional:      Appearance: He is obese.  HENT:     Head: Normocephalic.     Right Ear: Tympanic membrane and external ear normal.     Left Ear: Tympanic membrane and external ear normal.     Nose: Nose normal.  Eyes:     Extraocular Movements: Extraocular  movements intact.     Pupils: Pupils are equal, round, and reactive to light.  Cardiovascular:     Rate and Rhythm: Normal rate and regular rhythm.  Pulmonary:     Effort: Pulmonary effort is normal.     Breath sounds: Normal breath sounds.  Abdominal:     General: Bowel sounds are normal. There is distension.     Palpations: Abdomen is soft.  Musculoskeletal:        General: Normal range of motion.  Skin:    General: Skin is warm and dry.  Neurological:     Mental Status: He is oriented to person, place, and time.  Psychiatric:        Mood and Affect: Mood normal.        Behavior: Behavior normal.        Thought Content: Thought content normal.        Judgment: Judgment normal.      Assessment & Plan  Saiquan was seen today for diabetes and hypertension.  Diagnoses and all orders for this visit:  Essential hypertension BP goal - < 130/80 Explained that having normal blood pressure is the goal and medications are helping to get to goal and maintain normal blood pressure. DIET: Limit salt intake, read nutrition labels to check salt content, limit fried and high fatty foods  Avoid using multisymptom OTC cold preparations that generally contain sudafed which can rise BP. Consult with pharmacist on best cold relief products to use for persons with HTN EXERCISE Discussed incorporating exercise such as walking - 30 minutes most days of the week and can do in 10 minute intervals    -     CBC with Differential/Platelet -     CMP14+EGFR  Type  2 diabetes mellitus without complication, with long-term current use of insulin  (HCC) -  Followed by clinical pharmacist     CBC with Differential/Platelet  Hypercholesteremia -     Lipid panel  Colon cancer screening -     Ambulatory referral to Gastroenterology    Patient have been counseled extensively about nutrition and exercise. Other issues discussed during this visit include: low cholesterol diet, weight control and daily exercise, foot care, annual eye examinations at Ophthalmology, importance of adherence with medications and regular follow-up. We also discussed long term complications of uncontrolled diabetes and hypertension.   Return for per [facility].  The patient was given clear instructions to go to ER or return to medical center if symptoms don't improve, worsen or new problems develop. The patient verbalized understanding. The patient was told to call to get lab results if they haven't heard anything in the next week.   This note has been created with Education officer, environmental. Any transcriptional errors are unintentional.   Rosaline SHAUNNA Bohr, NP 02/21/2024, 10:19 AM

## 2024-02-28 ENCOUNTER — Telehealth: Payer: Self-pay | Admitting: Primary Care

## 2024-02-28 ENCOUNTER — Ambulatory Visit: Payer: MEDICAID | Admitting: Pharmacist

## 2024-02-28 NOTE — Telephone Encounter (Signed)
Called patient, unable to reach patient

## 2024-02-28 NOTE — Telephone Encounter (Signed)
 Copied from CRM [id number]. Topic: General - Other >> Feb 28, 2024  4:06 PM Jayma L wrote:  Reason for CRM: patient stated he's trying to reach a dr herlene in a specialty office but unsure the first name of the dr. Asking if his PCP would know. Said he missed a appt today but knows no information about the dr.

## 2024-02-29 ENCOUNTER — Telehealth: Payer: Self-pay | Admitting: Pharmacist

## 2024-02-29 NOTE — Telephone Encounter (Signed)
 Call placed to reschedule appointment that he missed yesterday. No answer. Left HIPAA-compliant VM with instructions to return my call.

## 2024-03-10 ENCOUNTER — Other Ambulatory Visit: Payer: Self-pay

## 2024-03-16 NOTE — Progress Notes (Shared)
 [facility] - Clinic Note  03/30/2024   CHIEF COMPLAINT Patient presents for No chief complaint on file.  HISTORY OF PRESENT ILLNESS: Brandon Mcneil is a 43 y.o. male who presents to the clinic today for:     Pt states he has seen some floaters due to physical encounters he's had recently. Pt is on both oral and insulin  meds for his diabetes. A1C in March 2024 was 9.5. Takes HTN medication as well.   Referring physician: Celestia Rosaline SQUIBB, NP [address] [place],  [place] [address]  HISTORICAL INFORMATION:  Selected notes from the MEDICAL RECORD NUMBER Referred by Dr. JAMA:  Ocular Hx- PMH-   CURRENT MEDICATIONS: No current outpatient medications on file. (Ophthalmic Drugs)   No current facility-administered medications for this visit. (Ophthalmic Drugs)   Current Outpatient Medications (Other)  Medication Sig   Accu-Chek Softclix Lancets lancets Use to check blood sugar 3 times daily.   acetaminophen  (TYLENOL ) 325 MG tablet Take 2 tablets (650 mg total) by mouth in the morning and at bedtime. (Patient taking differently: Take 325 mg by mouth every 6 (six) hours as needed for mild pain (pain score 1-3) or headache.)   amLODipine  (NORVASC ) 10 MG tablet Take 1 tablet (10 mg total) by mouth daily.   blood glucose meter kit and supplies KIT Dispense based on patient and insurance preference. Use up to four times daily as directed. (FOR ICD-9 250.00, 250.01).   Blood Glucose Monitoring Suppl (ACCU-CHEK GUIDE) w/Device KIT Use to check blood sugar 3 times daily.   Continuous Glucose Receiver (FREESTYLE LIBRE 3 READER) DEVI Use to check blood sugar continuously.   Continuous Glucose Sensor (FREESTYLE LIBRE 3 PLUS SENSOR) MISC Change sensor every 15 days. Use to check blood sugar continuously.   doxycycline  (VIBRA -TABS) 100 MG tablet Take 1 tablet (100 mg total) by mouth 2 (two) times daily.   gabapentin  (NEURONTIN ) 300 MG capsule Take 1 capsule (300  mg total) by mouth 3 (three) times daily for anxiety. (Patient not taking: Reported on 10/18/2023)   glucose blood (ACCU-CHEK GUIDE TEST) test strip Use to check blood sugar 3 times daily.   hydrocortisone  cream 0.5 % Apply 1 Application topically 2 (two) times daily. Apply around neck   insulin  glargine (LANTUS  SOLOSTAR) 100 UNIT/ML Solostar Pen Inject 10 Units into the skin daily.   insulin  lispro (HUMALOG  KWIKPEN) 100 UNIT/ML KwikPen Inject 0.06 mLs (6 Units total) into the skin 2 (two) times daily with a meal. May increase up to 10 units twice daily as directed by your provider.   Insulin  Pen Needle (TRUEPLUS 5-BEVEL PEN NEEDLES) 32G X 4 MM MISC Use to inject Basaglar  once daily.   losartan  (COZAAR ) 50 MG tablet Take 1 tablet (50 mg total) by mouth daily.   metFORMIN  (GLUCOPHAGE -XR) 500 MG 24 hr tablet Take 2 tablets (1,000 mg total) by mouth 2 (two) times daily with a meal.   naltrexone  (DEPADE) 50 MG tablet Take one tablet daily at the same time every day for alcohol  (Patient not taking: Reported on 10/18/2023)   naltrexone  (DEPADE) 50 MG tablet Take 1 tablet (50 mg total) by mouth daily for alcohol . (Patient not taking: Reported on 10/18/2023)   naltrexone  (DEPADE) 50 MG tablet Take 1 tablet (50 mg total) by mouth daily at the same time every day for alcohol  (Patient not taking: Reported on 10/18/2023)   polyethylene glycol (MIRALAX  / GLYCOLAX ) 17 g packet Take 17 g by mouth 2 (  two) times daily.   rosuvastatin  (CRESTOR ) 20 MG tablet Take 1 tablet (20 mg total) by mouth daily.   Semaglutide ,0.25 or 0.5MG /DOS, (OZEMPIC , 0.25 OR 0.5 MG/DOSE,) 2 MG/3ML SOPN Inject 0.25 mg into the skin once a week. For 4 weeks. Then, increase to 0.5 mg weekly.   traZODone  (DESYREL ) 50 MG tablet Take 1-2 tablets by mouth at bedtime as needed for sleep (Patient not taking: Reported on 10/18/2023)   traZODone  (DESYREL ) 50 MG tablet Take 1-2 tablets (50-100 mg total) by mouth daily as needed at bedtime for sleep. (Patient not  taking: Reported on 10/18/2023)   traZODone  (DESYREL ) 50 MG tablet Take 1-2 tablets (50-100 mg total) by mouth at bedtime for sleep. (Patient not taking: Reported on 10/18/2023)   witch hazel-glycerin  (TUCKS) pad Apply topically as needed for irritation or hemorrhoids.   No current facility-administered medications for this visit. (Other)   REVIEW OF SYSTEMS:   ALLERGIES Allergies  Allergen Reactions   Lisinopril  Swelling and Other (See Comments)    Lips and mouth became swollen   Penicillins Anaphylaxis, Itching, Swelling and Rash   Ibuprofen  Other (See Comments)    Was told to not take this (because of a prior ulcer)   Tomato Hives and Other (See Comments)    Certain tomato sauces break out the lips in HIVES   PAST MEDICAL HISTORY Past Medical History:  Diagnosis Date   Abscess, perirectal 08/12/2020   Bipolar I disorder, most recent episode depressed (HCC) 09/11/2013   Depression    Essential hypertension 02/03/2017   Hypercholesteremia    Lumbar foraminal stenosis 01/06/2023   Poorly controlled diabetes mellitus (HCC) 01/06/2023   Tobacco use disorder 09/11/2013   Past Surgical History:  Procedure Laterality Date   INCISION AND DRAINAGE ABSCESS  05/2015   Left buttock   INCISION AND DRAINAGE ABSCESS  10/2015   Natal cleft - ED   INCISION AND DRAINAGE ABSCESS  02/2016   natal cleft   INCISION AND DRAINAGE ABSCESS  04/2018   left gluteal abscess   INCISION AND DRAINAGE ABSCESS  03/2021   right gluteal   INCISION AND DRAINAGE INTRA ORAL ABSCESS  2015   INCISION AND DRAINAGE PERIRECTAL ABSCESS N/A 08/02/2020   I&D right perirectal abscess   FAMILY HISTORY Family History  Problem Relation Age of Onset   Healthy Mother    Healthy Father    SOCIAL HISTORY Social History   Tobacco Use   Smoking status: Every Day    Current packs/day: 0.50    Average packs/day: 0.5 packs/day for 14.0 years (7.0 ttl pk-yrs)    Types: Cigarettes   Smokeless tobacco: Never    Tobacco comments:    1/2 pack a day  Vaping Use   Vaping status: Never Used  Substance Use Topics   Alcohol  use: Yes    Alcohol /week: 14.0 standard drinks of alcohol     Types: 14 Cans of beer per week    Comment: 2 a day   Drug use: No    Types: Marijuana       OPHTHALMIC EXAM:  Not recorded    IMAGING AND PROCEDURES  Imaging and Procedures for 03/30/2024         ASSESSMENT/PLAN:   ICD-10-CM   1. Moderate nonproliferative diabetic retinopathy of both eyes without macular edema associated with type 2 diabetes mellitus (HCC)  Z88.6606     2. Diabetes mellitus treated with insulin  and oral medication (HCC)  E11.9    Z79.4    Z79.84  3. Hypertensive retinopathy of both eyes  H35.033     4. Cortical age-related cataract of both eyes  H25.013       1. Moderate nonproliferative diabetic retinopathy w/o DME, both eyes - The incidence, risk factors for progression, natural history and treatment options for diabetic retinopathy were discussed with patient.   - The need for close monitoring of blood glucose, blood pressure, and serum lipids, avoiding cigarette or any type of tobacco, and the need for long term follow up was also discussed with patient.  - BCVA 20/20 OU - A1C on 5.23.24 was 9.5 - FA on 5.14.25 shows NPDR OU -- scattered leaking MA, NO NV. - OCT on 5.14.25 shows OD: mild cystic changes and IRHM inf mac. OS: Scattered cystic changesand IRHM greatest inferior mac.  - f/u in 3-4 mos -- DFE/OCT   2,3. Hypertensive retinopathy OU - discussed importance of tight BP control - monitor   4. Mixed Cataract OU - The symptoms of cataract, surgical options, and treatments and risks were discussed with patient. - discussed diagnosis and progression - monitor   Ophthalmic Meds Ordered this visit:  No orders of the defined types were placed in this encounter.    No follow-ups on file.  There are no Patient Instructions on file for this visit.  Explained the  diagnoses, plan, and follow up with the patient and they expressed understanding.  Patient expressed understanding of the importance of proper follow up care.   This document serves as a record of services personally performed by Redell JUDITHANN Hans, MD, PhD. It was created on their behalf by Avelina Pereyra, COA an ophthalmic technician. The creation of this record is the provider's dictation and/or activities during the visit.   Electronically signed by: Avelina GORMAN Pereyra, COT  03/16/24  8:08 AM    Redell JUDITHANN Hans, M.D., Ph.D. Diseases & Surgery of the Retina and Vitreous [facility] 03/30/2024   Abbreviations: M myopia (nearsighted); A astigmatism; H hyperopia (farsighted); P presbyopia; Mrx spectacle prescription;  CTL contact lenses; OD right eye; OS left eye; OU both eyes  XT exotropia; ET esotropia; PEK punctate epithelial keratitis; PEE punctate epithelial erosions; DES dry eye syndrome; MGD meibomian gland dysfunction; ATs artificial tears; PFAT's preservative free artificial tears; NSC nuclear sclerotic cataract; PSC posterior subcapsular cataract; ERM epi-retinal membrane; PVD posterior vitreous detachment; RD retinal detachment; DM diabetes mellitus; DR diabetic retinopathy; NPDR non-proliferative diabetic retinopathy; PDR proliferative diabetic retinopathy; CSME clinically significant macular edema; DME diabetic macular edema; dbh dot blot hemorrhages; CWS cotton wool spot; POAG primary open angle glaucoma; C/D cup-to-disc ratio; HVF humphrey visual field; GVF goldmann visual field; OCT optical coherence tomography; IOP intraocular pressure; BRVO Branch retinal vein occlusion; CRVO central retinal vein occlusion; CRAO central retinal artery occlusion; BRAO branch retinal artery occlusion; RT retinal tear; SB scleral buckle; PPV pars plana vitrectomy; VH Vitreous hemorrhage; PRP panretinal laser photocoagulation; IVK intravitreal kenalog; VMT vitreomacular traction; MH Macular  hole;  NVD neovascularization of the disc; NVE neovascularization elsewhere; AREDS age related eye disease study; ARMD age related macular degeneration; POAG primary open angle glaucoma; EBMD epithelial/anterior basement membrane dystrophy; ACIOL anterior chamber intraocular lens; IOL intraocular lens; PCIOL posterior chamber intraocular lens; Phaco/IOL phacoemulsification with intraocular lens placement; PRK photorefractive keratectomy; LASIK laser assisted in situ keratomileusis; HTN hypertension; DM diabetes mellitus; COPD chronic obstructive pulmonary disease

## 2024-03-20 ENCOUNTER — Telehealth: Payer: Self-pay | Admitting: Primary Care

## 2024-03-20 NOTE — Telephone Encounter (Signed)
 Called pt to confirm appt LVM

## 2024-03-21 ENCOUNTER — Ambulatory Visit: Payer: MEDICAID | Admitting: Pharmacist

## 2024-03-21 ENCOUNTER — Other Ambulatory Visit: Payer: Self-pay

## 2024-03-21 ENCOUNTER — Encounter: Payer: Self-pay | Admitting: Pharmacist

## 2024-03-21 ENCOUNTER — Other Ambulatory Visit (HOSPITAL_BASED_OUTPATIENT_CLINIC_OR_DEPARTMENT_OTHER): Payer: MEDICAID | Admitting: Pharmacist

## 2024-03-21 DIAGNOSIS — Z7985 Long-term (current) use of injectable non-insulin antidiabetic drugs: Secondary | ICD-10-CM

## 2024-03-21 DIAGNOSIS — E119 Type 2 diabetes mellitus without complications: Secondary | ICD-10-CM

## 2024-03-21 DIAGNOSIS — Z794 Long term (current) use of insulin: Secondary | ICD-10-CM

## 2024-03-21 MED ORDER — OZEMPIC (0.25 OR 0.5 MG/DOSE) 2 MG/3ML ~~LOC~~ SOPN
0.5000 mg | PEN_INJECTOR | SUBCUTANEOUS | 1 refills | Status: DC
  Filled 2024-03-21: qty 3, 28d supply, fill #0
  Filled 2024-04-24: qty 3, 28d supply, fill #1

## 2024-03-21 NOTE — Progress Notes (Signed)
 Pharmacy TNM Diabetes Measure Review  S:  Patient was identified in a report as being at risk for failing the True [place] Metric of A1c control (<8%). Last A1c was 8.7 (01/27/2024). Last PCP visit was 02/21/2024.  Call placed to patient to discuss diabetes control and medication management. Patient has been seen by the clinical pharmacist with most recent visit 01/27/2024. We started Humalog  and Ozempic  at that time. We also submitted PA requests for the Ozempic  and FL3+ supplies. These have been approved and subsequently filled. Pt did not present to his in-office appt this morning. I called him. He's doing well but going through some things. Endorses adherence but is requesting refills of testing supplies. Of note, he endorses adherence to his insulin  and Ozempic  but is taking 0.25 mg weekly instead of 0.5 mg weekly. Denies any NV, abdominal pain. Reports sugars 120-130s.  Current diabetes medications include: Ozempic  0.25 mg weekly, Lantus  10 units daily, Humalog  6 units BID before meals, metformin  500 mg XR - takes 2 tablets (1000mg  )BID Patient reports adherence to taking all medications as prescribed.   Insurance coverage: [employer]  Patient denies hypoglycemic events.  Reported home fasting blood sugars: 120s-130s   O:   Lab Results  Component Value Date   HGBA1C 8.7 (A) 01/27/2024   There were no vitals filed for this visit.  Lipid Panel     Component Value Date/Time   CHOL 138 09/20/2023 1135   TRIG 81 09/20/2023 1135   HDL 47 09/20/2023 1135   CHOLHDL 2.9 09/20/2023 1135   LDLCALC 75 09/20/2023 1135    Clinical Atherosclerotic Cardiovascular Disease (ASCVD): No  The 10-year ASCVD risk score (Arnett DK, et al., 2019) is: 23.9%   Values used to calculate the score:     Age: 43 years     Clincally relevant sex: Male     Is Non-Hispanic African American: Yes     Diabetic: Yes     Tobacco smoker: Yes     Systolic Blood Pressure: 146 mmHg     Is BP treated: Yes     HDL  Cholesterol: 47 mg/dL     Total Cholesterol: 138 mg/dL   Patient is participating in a Managed Medicaid Plan: No   A/P: Diabetes longstanding currently uncontrolled, however, blood sugar is improving. Patient is not symptomatic at this time but is able to verbalize appropriate hypoglycemia management plan. Medication adherence appears to be appropriate, however, we will have him increase Ozempic  to 0.5  mg weekly. I have collaborated with pharmacy and they are filling this plus test strips. Pt is going to pick this up today. -Increased dose of Ozempic  to 0.5 mg weekly. -Continued current doses of Lantus , Humalog , and metformin .  -Patient educated on purpose, proper use, and potential adverse effects of Ozempic .  -Extensively discussed pathophysiology of diabetes, recommended lifestyle interventions, dietary effects on blood sugar control.  -Counseled on s/sx of and management of hypoglycemia.  -Next A1c anticipated 04/2024.   Follow-up:  Pharmacist 04/13/2024  Herlene Fleeta Morris, PharmD, BCACP, CPP Clinical Pharmacist [facility] [phone]

## 2024-03-24 ENCOUNTER — Other Ambulatory Visit: Payer: Self-pay

## 2024-03-30 ENCOUNTER — Encounter (INDEPENDENT_AMBULATORY_CARE_PROVIDER_SITE_OTHER): Payer: MEDICAID | Admitting: Ophthalmology

## 2024-03-30 ENCOUNTER — Encounter: Payer: Self-pay | Admitting: Podiatry

## 2024-03-30 ENCOUNTER — Ambulatory Visit (INDEPENDENT_AMBULATORY_CARE_PROVIDER_SITE_OTHER): Payer: MEDICAID

## 2024-03-30 ENCOUNTER — Ambulatory Visit (INDEPENDENT_AMBULATORY_CARE_PROVIDER_SITE_OTHER): Payer: MEDICAID | Admitting: Podiatry

## 2024-03-30 DIAGNOSIS — M7752 Other enthesopathy of left foot: Secondary | ICD-10-CM | POA: Diagnosis not present

## 2024-03-30 DIAGNOSIS — M7751 Other enthesopathy of right foot: Secondary | ICD-10-CM | POA: Diagnosis not present

## 2024-03-30 DIAGNOSIS — E113393 Type 2 diabetes mellitus with moderate nonproliferative diabetic retinopathy without macular edema, bilateral: Secondary | ICD-10-CM

## 2024-03-30 DIAGNOSIS — H25013 Cortical age-related cataract, bilateral: Secondary | ICD-10-CM

## 2024-03-30 DIAGNOSIS — E119 Type 2 diabetes mellitus without complications: Secondary | ICD-10-CM

## 2024-03-30 DIAGNOSIS — M778 Other enthesopathies, not elsewhere classified: Secondary | ICD-10-CM | POA: Diagnosis not present

## 2024-03-30 DIAGNOSIS — H35033 Hypertensive retinopathy, bilateral: Secondary | ICD-10-CM

## 2024-03-31 NOTE — Progress Notes (Signed)
 Subjective:   Patient ID: Brandon Mcneil, male   DOB: 43 y.o.   MRN: [id number]   HPI Patient presents stating he is getting discoloration between the 4th and 5th toes of both feet and was concerned because it was green and that patient is a diabetic.  Running a relative high A1c of 8.7 and patient does have moderate foot and ankle pain localized with discomfort within the joint of the sinus tarsi mild bilateral   ROS      Objective:  Physical Exam  Neurovascular status intact upon inspection I did not note current green discoloration between the 4th and 5th digits appears to be a localized condition with mild discomfort into the sinus tarsi bilateral     Assessment:  Several conditions with 1 being small fungal infection between the 4th and 5th toes localized no indications of big pathology along with low-grade inflammatory capsulitis     Plan:  H&P reviewed both conditions and at this point I went ahead and I did advise on Lamisil cream for between the toes with possibility for other or other treatment if symptoms change.  I then do not recommend treatment for the capsule except for good supportive shoe gear usage  X-rays were negative for signs of bony impingement of or any osteolysis around the fourth interspace bilateral

## 2024-04-11 ENCOUNTER — Telehealth: Payer: Self-pay | Admitting: Primary Care

## 2024-04-11 ENCOUNTER — Encounter (INDEPENDENT_AMBULATORY_CARE_PROVIDER_SITE_OTHER): Payer: MEDICAID | Admitting: Ophthalmology

## 2024-04-11 NOTE — Progress Notes (Shared)
 [facility] - Clinic Note  04/11/2024   CHIEF COMPLAINT Patient presents for No chief complaint on file.  HISTORY OF PRESENT ILLNESS: Brandon Mcneil is a 43 y.o. male who presents to the clinic today for:     Pt states he has seen some floaters due to physical encounters he's had recently. Pt is on both oral and insulin  meds for his diabetes. A1C in March 2024 was 9.5. Takes HTN medication as well.   Referring physician: Celestia Rosaline SQUIBB, NP [address] [place],  [place] [address]  HISTORICAL INFORMATION:  Selected notes from the MEDICAL RECORD NUMBER Referred by Dr. JAMA:  Ocular Hx- PMH-   CURRENT MEDICATIONS: No current outpatient medications on file. (Ophthalmic Drugs)   No current facility-administered medications for this visit. (Ophthalmic Drugs)   Current Outpatient Medications (Other)  Medication Sig   Accu-Chek Softclix Lancets lancets Use to check blood sugar 3 times daily.   acetaminophen  (TYLENOL ) 325 MG tablet Take 2 tablets (650 mg total) by mouth in the morning and at bedtime. (Patient taking differently: Take 325 mg by mouth every 6 (six) hours as needed for mild pain (pain score 1-3) or headache.)   amLODipine  (NORVASC ) 10 MG tablet Take 1 tablet (10 mg total) by mouth daily.   blood glucose meter kit and supplies KIT Dispense based on patient and insurance preference. Use up to four times daily as directed. (FOR ICD-9 250.00, 250.01).   Blood Glucose Monitoring Suppl (ACCU-CHEK GUIDE) w/Device KIT Use to check blood sugar 3 times daily.   Continuous Glucose Receiver (FREESTYLE LIBRE 3 READER) DEVI Use to check blood sugar continuously.   Continuous Glucose Sensor (FREESTYLE LIBRE 3 PLUS SENSOR) MISC Change sensor every 15 days. Use to check blood sugar continuously.   doxycycline  (VIBRA -TABS) 100 MG tablet Take 1 tablet (100 mg total) by mouth 2 (two) times daily.   gabapentin  (NEURONTIN ) 300 MG capsule Take 1 capsule (300  mg total) by mouth 3 (three) times daily for anxiety. (Patient not taking: Reported on 10/18/2023)   glucose blood (ACCU-CHEK GUIDE TEST) test strip Use to check blood sugar 3 times daily.   hydrocortisone  cream 0.5 % Apply 1 Application topically 2 (two) times daily. Apply around neck   insulin  glargine (LANTUS  SOLOSTAR) 100 UNIT/ML Solostar Pen Inject 10 Units into the skin daily.   insulin  lispro (HUMALOG  KWIKPEN) 100 UNIT/ML KwikPen Inject 0.06 mLs (6 Units total) into the skin 2 (two) times daily with a meal. May increase up to 10 units twice daily as directed by your provider.   Insulin  Pen Needle (TRUEPLUS 5-BEVEL PEN NEEDLES) 32G X 4 MM MISC Use to inject Basaglar  once daily.   losartan  (COZAAR ) 50 MG tablet Take 1 tablet (50 mg total) by mouth daily.   metFORMIN  (GLUCOPHAGE -XR) 500 MG 24 hr tablet Take 2 tablets (1,000 mg total) by mouth 2 (two) times daily with a meal.   naltrexone  (DEPADE) 50 MG tablet Take one tablet daily at the same time every day for alcohol  (Patient not taking: Reported on 10/18/2023)   naltrexone  (DEPADE) 50 MG tablet Take 1 tablet (50 mg total) by mouth daily for alcohol . (Patient not taking: Reported on 10/18/2023)   naltrexone  (DEPADE) 50 MG tablet Take 1 tablet (50 mg total) by mouth daily at the same time every day for alcohol  (Patient not taking: Reported on 10/18/2023)   polyethylene glycol (MIRALAX  / GLYCOLAX ) 17 g packet Take 17 g by mouth 2 (  two) times daily.   rosuvastatin  (CRESTOR ) 20 MG tablet Take 1 tablet (20 mg total) by mouth daily.   Semaglutide ,0.25 or 0.5MG /DOS, (OZEMPIC , 0.25 OR 0.5 MG/DOSE,) 2 MG/3ML SOPN Inject 0.5 mg into the skin once a week.   traZODone  (DESYREL ) 50 MG tablet Take 1-2 tablets by mouth at bedtime as needed for sleep (Patient not taking: Reported on 10/18/2023)   traZODone  (DESYREL ) 50 MG tablet Take 1-2 tablets (50-100 mg total) by mouth daily as needed at bedtime for sleep. (Patient not taking: Reported on 10/18/2023)   traZODone   (DESYREL ) 50 MG tablet Take 1-2 tablets (50-100 mg total) by mouth at bedtime for sleep. (Patient not taking: Reported on 10/18/2023)   witch hazel-glycerin  (TUCKS) pad Apply topically as needed for irritation or hemorrhoids.   No current facility-administered medications for this visit. (Other)   REVIEW OF SYSTEMS:   ALLERGIES Allergies  Allergen Reactions   Lisinopril  Swelling and Other (See Comments)    Lips and mouth became swollen   Penicillins Anaphylaxis, Itching, Swelling and Rash   Ibuprofen  Other (See Comments)    Was told to not take this (because of a prior ulcer)   Tomato Hives and Other (See Comments)    Certain tomato sauces break out the lips in HIVES   PAST MEDICAL HISTORY Past Medical History:  Diagnosis Date   Abscess, perirectal 08/12/2020   Bipolar I disorder, most recent episode depressed (HCC) 09/11/2013   Depression    Essential hypertension 02/03/2017   Hypercholesteremia    Lumbar foraminal stenosis 01/06/2023   Poorly controlled diabetes mellitus (HCC) 01/06/2023   Tobacco use disorder 09/11/2013   Past Surgical History:  Procedure Laterality Date   INCISION AND DRAINAGE ABSCESS  05/2015   Left buttock   INCISION AND DRAINAGE ABSCESS  10/2015   Natal cleft - ED   INCISION AND DRAINAGE ABSCESS  02/2016   natal cleft   INCISION AND DRAINAGE ABSCESS  04/2018   left gluteal abscess   INCISION AND DRAINAGE ABSCESS  03/2021   right gluteal   INCISION AND DRAINAGE INTRA ORAL ABSCESS  2015   INCISION AND DRAINAGE PERIRECTAL ABSCESS N/A 08/02/2020   I&D right perirectal abscess   FAMILY HISTORY Family History  Problem Relation Age of Onset   Healthy Mother    Healthy Father    SOCIAL HISTORY Social History   Tobacco Use   Smoking status: Every Day    Current packs/day: 0.50    Average packs/day: 0.5 packs/day for 14.0 years (7.0 ttl pk-yrs)    Types: Cigarettes   Smokeless tobacco: Never   Tobacco comments:    1/2 pack a day  Vaping  Use   Vaping status: Never Used  Substance Use Topics   Alcohol  use: Yes    Alcohol /week: 14.0 standard drinks of alcohol     Types: 14 Cans of beer per week    Comment: 2 a day   Drug use: No    Types: Marijuana       OPHTHALMIC EXAM:  Not recorded    IMAGING AND PROCEDURES  Imaging and Procedures for 04/11/2024         ASSESSMENT/PLAN: No diagnosis found.  1. Moderate nonproliferative diabetic retinopathy w/o DME, both eyes - The incidence, risk factors for progression, natural history and treatment options for diabetic retinopathy were discussed with patient.   - The need for close monitoring of blood glucose, blood pressure, and serum lipids, avoiding cigarette or any type of tobacco, and the need for  long term follow up was also discussed with patient.  - BCVA 20/20 OU - A1C on 5.23.24 was 9.5 - FA on 5.14.25 shows NPDR OU -- scattered leaking MA, NO NV. - OCT on 5.14.25 shows OD: mild cystic changes and IRHM inf mac. OS: Scattered cystic changesand IRHM greatest inferior mac.  - f/u in 3-4 mos -- DFE/OCT   2,3. Hypertensive retinopathy OU - discussed importance of tight BP control - monitor  4. Mixed Cataract OU - The symptoms of cataract, surgical options, and treatments and risks were discussed with patient. - discussed diagnosis and progression -monitor  Ophthalmic Meds Ordered this visit:  No orders of the defined types were placed in this encounter.    No follow-ups on file.  There are no Patient Instructions on file for this visit.  Explained the diagnoses, plan, and follow up with the patient and they expressed understanding.  Patient expressed understanding of the importance of proper follow up care.   This document serves as a record of services personally performed by Redell JUDITHANN Hans, MD, PhD. It was created on their behalf by Auston Muzzy, COMT. The creation of this record is the provider's dictation and/or activities during the  visit.  Electronically signed by: Auston Muzzy, COMT 04/11/24 8:07 AM    Redell JUDITHANN Hans, M.D., Ph.D. Diseases & Surgery of the Retina and Vitreous [facility] 04/11/2024    Abbreviations: M myopia (nearsighted); A astigmatism; H hyperopia (farsighted); P presbyopia; Mrx spectacle prescription;  CTL contact lenses; OD right eye; OS left eye; OU both eyes  XT exotropia; ET esotropia; PEK punctate epithelial keratitis; PEE punctate epithelial erosions; DES dry eye syndrome; MGD meibomian gland dysfunction; ATs artificial tears; PFAT's preservative free artificial tears; NSC nuclear sclerotic cataract; PSC posterior subcapsular cataract; ERM epi-retinal membrane; PVD posterior vitreous detachment; RD retinal detachment; DM diabetes mellitus; DR diabetic retinopathy; NPDR non-proliferative diabetic retinopathy; PDR proliferative diabetic retinopathy; CSME clinically significant macular edema; DME diabetic macular edema; dbh dot blot hemorrhages; CWS cotton wool spot; POAG primary open angle glaucoma; C/D cup-to-disc ratio; HVF humphrey visual field; GVF goldmann visual field; OCT optical coherence tomography; IOP intraocular pressure; BRVO Branch retinal vein occlusion; CRVO central retinal vein occlusion; CRAO central retinal artery occlusion; BRAO branch retinal artery occlusion; RT retinal tear; SB scleral buckle; PPV pars plana vitrectomy; VH Vitreous hemorrhage; PRP panretinal laser photocoagulation; IVK intravitreal kenalog; VMT vitreomacular traction; MH Macular hole;  NVD neovascularization of the disc; NVE neovascularization elsewhere; AREDS age related eye disease study; ARMD age related macular degeneration; POAG primary open angle glaucoma; EBMD epithelial/anterior basement membrane dystrophy; ACIOL anterior chamber intraocular lens; IOL intraocular lens; PCIOL posterior chamber intraocular lens; Phaco/IOL phacoemulsification with intraocular lens placement; PRK  photorefractive keratectomy; LASIK laser assisted in situ keratomileusis; HTN hypertension; DM diabetes mellitus; COPD chronic obstructive pulmonary disease

## 2024-04-11 NOTE — Telephone Encounter (Signed)
 Called patient to confirm upcoming appointment 04/13/2024. Patient appointment has been successfully confirmed

## 2024-04-12 NOTE — Progress Notes (Unsigned)
 S:     No chief complaint on file.  43 y.o. male who presents for diabetes evaluation, education, and management.  PMH is significant for T2DM w/ hx of DKA, HTN, bipolar I, tobacco use, alcohol  use.  Patient was referred  by Primary Care Provider, Rosaline Bohr, on 06/18/2023.  A1c was 8.8% at that visit. Michelle added Trulicity . I saw him after on 07/01/2023 and instructed him on proper injection technique, however he was unale to afford continued refills of Trulicity  d/t high copay. At last pharmacist visit on 08/27/23, pt reported taking Lantus  consistently, but nonadherence with metformin  and Novolog . Lantus  was increased to 26u daily and he was instructed to continue metformin  and Novolog  as prescribed. At PCP appt on 09/20/23, A1c was 8.9%, UACR WNL, and CMP unremarkable. At last visit with pharmacy on 10/18/23, meal time insulin  was changed to Humalog , he was instructed to use Lantus  supply until ran out, and continued on metformin . Most recently, patient was called by clinical pharmacist regarding diabetes/A1c management. Pt endorsed adherence to insulin , Ozempic  (taking 0.25 instead of 0.5). Denies any N/V or abdominal pain. Has been seeing sugars in the 120-130s.   Today, patient arrives in good spirits and presents without any assistance. He reports that he has been able to get all medications since he got insurance. Most recent A1c of 8.7% from 01/27/2024.   Family/Social History:  Fhx: no pertinent positives  Tobacco: current 0.5 PPD smoker Alcohol : 2 cans of beer weekly   Patient admits to non-adherence due to running out of medications.   Current diabetes medications include: Lantus  10 units daily, Ozempic  0.5 mg weekly, metformin  1000 mg PO BID (taking 500 mg BID), Humalog  6 u BID before meals   Previous diabetes medications: Trulicity  0.75 mg weekly (not taking d/t high copay), Lantus  (stopped d/t cost), Novolog  (changed d/t insurance preference)   Current hypertension  medications include: amlodipine  10 mg daily, losartan  50 mg daily  Insurance coverage: [employer]  Patient denies hypoglycemic events.   Reported home AM BG: 215-226 mg/dL  Reported home post-prandial blood sugars: 210-230s mg/dL (down from 699-599 mg/dL) Denies BG < 899 or > 749 mg/dL.   Patient endorses nocturia (nighttime urination) - improved, still getting up 2x/night. Patient denies neuropathy (nerve pain) -- improved. Patient denies visual changes- improved, saw eye doctor last month. Patient repots self foot exams.  Improved polydipsia.   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  Lipid Panel     Component Value Date/Time   CHOL 138 09/20/2023 1135   TRIG 81 09/20/2023 1135   HDL 47 09/20/2023 1135   CHOLHDL 2.9 09/20/2023 1135   LDLCALC 75 09/20/2023 1135    Clinical Atherosclerotic Cardiovascular Disease (ASCVD): Yes  The 10-year ASCVD risk score (Arnett DK, et al., 2019) is: 23.9%   Values used to calculate the score:     Age: 15 years     Clincally relevant sex: Male     Is Non-Hispanic African American: Yes     Diabetic: Yes     Tobacco smoker: Yes     Systolic Blood Pressure: 146 mmHg     Is BP treated:  Yes     HDL Cholesterol: 47 mg/dL     Total Cholesterol: 138 mg/dL    A/P: Diabetes longstanding currently uncontrolled with last A1c of 8.7% (above goal < 7%). Control is suboptimal due to significant financial barriers, however patient now has Medicaid and is motivated to optimize regimen. Patient is able to verbalize appropriate hypoglycemia management plan. Medication adherence appears okay.  - ____  metformin  XR 1000 mg PO BID.  Encouraged to take 2 tablets twice daily.  -_____ Humalog  6 units BID with breakfast and dinner. - _____ Lantus  10 units once daily in the morning  - _____ Ozempic  0.5 mg weekly  - Recommend to check BG fasting and prior to taking Humalog  with meals.  -Extensively discussed pathophysiology of diabetes, recommended lifestyle interventions, dietary effects on blood sugar control.  -Counseled on s/sx of and management of hypoglycemia.  -Next A1c anticipated 04/2024.   Written patient instructions provided. Patient verbalized understanding of treatment plan. Total time in face to face counseling ___ minutes.    Follow-up:  Pharmacist in 1 month PCP: not scheduled  Patient seen with: Tinnie Norrie, PharmD Candidate

## 2024-04-13 ENCOUNTER — Ambulatory Visit: Payer: MEDICAID | Admitting: Pharmacist

## 2024-04-18 ENCOUNTER — Ambulatory Visit: Payer: Self-pay

## 2024-04-18 ENCOUNTER — Telehealth (INDEPENDENT_AMBULATORY_CARE_PROVIDER_SITE_OTHER): Payer: Self-pay | Admitting: Primary Care

## 2024-04-18 NOTE — Telephone Encounter (Signed)
 Called pt to reschedule appt. Pt's phone is unavailable.

## 2024-04-18 NOTE — Telephone Encounter (Signed)
 FYI Only or Action Required?: Action required by provider: clinical question for provider.  Patient was last seen in primary care on 02/21/2024 by Celestia Rosaline SQUIBB, NP.  Called Nurse Triage reporting Requesting medication.  Symptoms began several days ago.  Interventions attempted: Nothing.  Symptoms are: unchanged. Pt. States he is seeing [facility] for disability and they want my PCP to start me on medicine for my bi polar disorder. Request ASAP, no answer on CAL line. Please advise pt.  Triage Disposition: Call PCP Now  Patient/caregiver understands and will follow disposition?: Yes   Copied from CRM #[id number]. Topic: Clinical - Red Word Triage >> Apr 18, 2024  2:25 PM Winona R wrote: Pt calling to request a referral for mental health as his disability Doctor told him he needs to be seen for mood swings along with other symptoms. Pt has NOT spoken with the provider about this referral in the past. Reason for Disposition  [1] Caller has URGENT medicine question about med that primary care doctor (or NP/PA) or specialist prescribed AND [2] triager unable to answer question  Answer Assessment - Initial Assessment Questions 1. NAME of MEDICINE: What medicine(s) are you calling about?     Asking for medication for bipolar disorder 2. QUESTION: What is your question? (e.g., double dose of medicine, side effect)     New medicine 3. PRESCRIBER: Who prescribed the medicine? Reason: if prescribed by specialist, call should be referred to that group.     No one 4. SYMPTOMS: Do you have any symptoms? If Yes, ask: What symptoms are you having?  How bad are the symptoms (e.g., mild, moderate, severe)     depression 5. PREGNANCY:  Is there any chance that you are pregnant? When was your last menstrual period?     N/a  Protocols used: Medication Question Call-A-AH

## 2024-04-18 NOTE — Telephone Encounter (Signed)
 Spoke with patient advised that he needs  an appointment for request. Appointment scheduled with provider

## 2024-04-19 ENCOUNTER — Ambulatory Visit (INDEPENDENT_AMBULATORY_CARE_PROVIDER_SITE_OTHER): Payer: MEDICAID | Admitting: Primary Care

## 2024-04-24 ENCOUNTER — Other Ambulatory Visit: Payer: Self-pay

## 2024-04-25 ENCOUNTER — Other Ambulatory Visit: Payer: Self-pay

## 2024-04-25 MED ORDER — NALTREXONE HCL 50 MG PO TABS
50.0000 mg | ORAL_TABLET | Freq: Every day | ORAL | 0 refills | Status: DC
  Filled 2024-04-25: qty 30, 30d supply, fill #0

## 2024-04-25 MED ORDER — LORAZEPAM 0.5 MG PO TABS
ORAL_TABLET | ORAL | 0 refills | Status: AC
  Filled 2024-04-25: qty 10, 4d supply, fill #0

## 2024-04-25 MED ORDER — TRAZODONE HCL 50 MG PO TABS
50.0000 mg | ORAL_TABLET | Freq: Every evening | ORAL | 0 refills | Status: DC | PRN
  Filled 2024-04-25: qty 60, 30d supply, fill #0

## 2024-04-27 ENCOUNTER — Ambulatory Visit (INDEPENDENT_AMBULATORY_CARE_PROVIDER_SITE_OTHER): Payer: MEDICAID | Admitting: Primary Care

## 2024-04-27 VITALS — BP 118/73 | HR 99

## 2024-04-27 DIAGNOSIS — F3162 Bipolar disorder, current episode mixed, moderate: Secondary | ICD-10-CM

## 2024-04-27 NOTE — Progress Notes (Signed)
 [facility]  Brandon Mcneil, is a 43 y.o. male  RDW:[id number]  FMW:[id number]  DOB - February 02, 1981  No chief complaint on file.      Subjective:   Brandon Mcneil is a 44 y.o. male here today for adisability papers. Requesting referral to psyche  requesting medication for tx of bipolar.  . Patient has No headache, No chest pain, No abdominal pain - No Nausea, No new weakness tingling or numbness, No Cough - shortness of breath No problems updated.  Comprehensive ROS Pertinent positive and negative noted in HPI   Allergies  Allergen Reactions   Lisinopril  Swelling and Other (See Comments)    Lips and mouth became swollen   Penicillins Anaphylaxis, Itching, Swelling and Rash   Ibuprofen  Other (See Comments)    Was told to not take this (because of a prior ulcer)   Tomato Hives and Other (See Comments)    Certain tomato sauces break out the lips in HIVES    Past Medical History:  Diagnosis Date   Abscess, perirectal 08/12/2020   Bipolar I disorder, most recent episode depressed (HCC) 09/11/2013   Depression    Essential hypertension 02/03/2017   Hypercholesteremia    Lumbar foraminal stenosis 01/06/2023   Poorly controlled diabetes mellitus (HCC) 01/06/2023   Tobacco use disorder 09/11/2013    Current Outpatient Medications on File Prior to Visit  Medication Sig Dispense Refill   Accu-Chek Softclix Lancets lancets Use to check blood sugar 3 times daily. 100 each 6   acetaminophen  (TYLENOL ) 325 MG tablet Take 2 tablets (650 mg total) by mouth in the morning and at bedtime. (Patient taking differently: Take 325 mg by mouth every 6 (six) hours as needed for mild pain (pain score 1-3) or headache.) 60 tablet 0   amLODipine  (NORVASC ) 10 MG tablet Take 1 tablet (10 mg total) by mouth daily. 90 tablet 3   blood glucose meter kit and supplies KIT Dispense based on patient and insurance preference. Use up to four times daily as directed. (FOR ICD-9 250.00,  250.01). 1 each 0   Blood Glucose Monitoring Suppl (ACCU-CHEK GUIDE) w/Device KIT Use to check blood sugar 3 times daily. 1 kit 0   Continuous Glucose Receiver (FREESTYLE LIBRE 3 READER) DEVI Use to check blood sugar continuously. 1 each 0   Continuous Glucose Sensor (FREESTYLE LIBRE 3 PLUS SENSOR) MISC Change sensor every 15 days. Use to check blood sugar continuously. 2 each 6   doxycycline  (VIBRA -TABS) 100 MG tablet Take 1 tablet (100 mg total) by mouth 2 (two) times daily. 14 tablet 0   gabapentin  (NEURONTIN ) 300 MG capsule Take 1 capsule (300 mg total) by mouth 3 (three) times daily for anxiety. (Patient not taking: Reported on 10/18/2023) 180 capsule 1   glucose blood (ACCU-CHEK GUIDE TEST) test strip Use to check blood sugar 3 times daily. 100 each 6   hydrocortisone  cream 0.5 % Apply 1 Application topically 2 (two) times daily. Apply around neck 30 g 0   insulin  glargine (LANTUS  SOLOSTAR) 100 UNIT/ML Solostar Pen Inject 10 Units into the skin daily. 3 mL 2   insulin  lispro (HUMALOG  KWIKPEN) 100 UNIT/ML KwikPen Inject 0.06 mLs (6 Units total) into the skin 2 (two) times daily with a meal. May increase up to 10 units twice daily as directed by your provider. 15 mL 6   Insulin  Pen Needle (TRUEPLUS 5-BEVEL PEN NEEDLES) 32G X 4 MM MISC Use to inject Basaglar  once daily. 100 each 2   LORazepam  (ATIVAN ) 0.5  MG tablet Take 1 tablet (0.5 mg total) by mouth 4 (four) times daily for 1 day, THEN 1 tablet (0.5 mg total) 3 (three) times daily for 1 day, THEN 1 tablet (0.5 mg total) 2 (two) times daily for 1 day, THEN 1 tablet (0.5 mg total) daily for 1 day. THEN STOP.Do not drink alcohol . 10 tablet 0   losartan  (COZAAR ) 50 MG tablet Take 1 tablet (50 mg total) by mouth daily. 90 tablet 3   metFORMIN  (GLUCOPHAGE -XR) 500 MG 24 hr tablet Take 2 tablets (1,000 mg total) by mouth 2 (two) times daily with a meal. 360 tablet 3   naltrexone  (DEPADE) 50 MG tablet Take one tablet daily at the same time every day for  alcohol  (Patient not taking: Reported on 10/18/2023) 60 tablet 0   naltrexone  (DEPADE) 50 MG tablet Take 1 tablet (50 mg total) by mouth daily for alcohol . (Patient not taking: Reported on 10/18/2023) 60 tablet 0   naltrexone  (DEPADE) 50 MG tablet Take 1 tablet (50 mg total) by mouth daily at the same time every day for alcohol  (Patient not taking: Reported on 10/18/2023) 60 tablet 0   naltrexone  (DEPADE) 50 MG tablet Take 1 tablet (50 mg total) by mouth daily at the same time for alcohol . 30 tablet 0   polyethylene glycol (MIRALAX  / GLYCOLAX ) 17 g packet Take 17 g by mouth 2 (two) times daily. 14 each 0   rosuvastatin  (CRESTOR ) 20 MG tablet Take 1 tablet (20 mg total) by mouth daily. 90 tablet 2   Semaglutide ,0.25 or 0.5MG /DOS, (OZEMPIC , 0.25 OR 0.5 MG/DOSE,) 2 MG/3ML SOPN Inject 0.5 mg into the skin once a week. 3 mL 1   traZODone  (DESYREL ) 50 MG tablet Take 1-2 tablets by mouth at bedtime as needed for sleep (Patient not taking: Reported on 10/18/2023) 60 tablet 0   traZODone  (DESYREL ) 50 MG tablet Take 1-2 tablets (50-100 mg total) by mouth daily as needed at bedtime for sleep. (Patient not taking: Reported on 10/18/2023) 60 tablet 0   traZODone  (DESYREL ) 50 MG tablet Take 1-2 tablets (50-100 mg total) by mouth at bedtime for sleep. (Patient not taking: Reported on 10/18/2023) 120 tablet 0   traZODone  (DESYREL ) 50 MG tablet Take 1-2 tablets (50-100 mg total) by mouth at bedtime as needed for sleep. 60 tablet 0   witch hazel-glycerin  (TUCKS) pad Apply topically as needed for irritation or hemorrhoids. 40 each 0   No current facility-administered medications on file prior to visit.   Health Maintenance  Topic Date Due   Eye exam for diabetics  Never done   Hepatitis B Vaccine (1 of 3 - 19+ 3-dose series) Never done   HPV Vaccine (1 - Risk 3-dose SCDM series) Never done   Pneumococcal Vaccine (2 of 2 - PCV) 06/15/2017   Colon Cancer Screening  Never done   Flu Shot  03/17/2024   COVID-19 Vaccine (1 -  2024-25 season) Never done   Complete foot exam   06/17/2024   Hemoglobin A1C  07/28/2024   Yearly kidney function blood test for diabetes  09/19/2024   Yearly kidney health urinalysis for diabetes  09/19/2024   DTaP/Tdap/Td vaccine (3 - Td or Tdap) 06/15/2026   HIV Screening  Completed   Meningitis B Vaccine  Aged Out   Hepatitis C Screening  Discontinued    BP 118/73 (BP Location: Left Arm, Patient Position: Sitting, Cuff Size: Large)   Pulse 99   SpO2 100%   BP Readings from Last 3 Encounters:  04/27/24 118/73  02/21/24 (!) 146/92  01/27/24 136/86      Physical Exam Vitals reviewed.  Constitutional:      Appearance: He is obese.  HENT:     Head: Normocephalic.     Right Ear: Tympanic membrane and external ear normal.     Left Ear: Tympanic membrane and external ear normal.     Nose: Nose normal.  Eyes:     Extraocular Movements: Extraocular movements intact.     Pupils: Pupils are equal, round, and reactive to light.  Cardiovascular:     Rate and Rhythm: Normal rate and regular rhythm.  Pulmonary:     Effort: Pulmonary effort is normal.     Breath sounds: Normal breath sounds.  Abdominal:     General: Bowel sounds are normal. There is distension.     Palpations: Abdomen is soft.  Musculoskeletal:        General: Normal range of motion.  Skin:    General: Skin is warm and dry.  Neurological:     Mental Status: He is oriented to person, place, and time.  Psychiatric:        Mood and Affect: Mood normal.        Behavior: Behavior normal.        Thought Content: Thought content normal.        Judgment: Judgment normal.     Assessment & Plan  Brandon Mcneil was seen today for referral.  Diagnoses and all orders for this visit:  Bipolar disorder, current episode mixed, moderate (HCC) -     Ambulatory referral to Psychiatry     Patient have been counseled extensively about nutrition and exercise. Other issues discussed during this visit include: low cholesterol  diet, weight control and daily exercise, foot care, annual eye examinations at Ophthalmology, importance of adherence with medications and regular follow-up. We also discussed long term complications of uncontrolled diabetes and hypertension.   No follow-ups on file.  The patient was given clear instructions to go to ER or return to medical center if symptoms don't improve, worsen or new problems develop. The patient verbalized understanding. The patient was told to call to get lab results if they haven't heard anything in the next week.   This note has been created with Education officer, environmental. Any transcriptional errors are unintentional.   Brandon SHAUNNA Bohr, NP 04/27/2024, 2:55 PM

## 2024-05-01 ENCOUNTER — Telehealth: Payer: Self-pay | Admitting: Lab

## 2024-05-01 NOTE — Telephone Encounter (Signed)
 Patient states no medication was called in for him from last visit stated it was for pain and swelling of his foot please advise.

## 2024-05-01 NOTE — Telephone Encounter (Signed)
 Spoke to patient and advised patient will follow up for swelling if it doesn't subside.

## 2024-05-02 NOTE — Progress Notes (Shared)
 [facility] - Clinic Note  05/15/2024   CHIEF COMPLAINT Patient presents for No chief complaint on file.  HISTORY OF PRESENT ILLNESS: Brandon Mcneil is a 43 y.o. male who presents to the clinic today for:     Pt states he has seen some floaters due to physical encounters he's had recently. Pt is on both oral and insulin  meds for his diabetes. A1C in March 2024 was 9.5. Takes HTN medication as well.   Referring physician: Celestia Rosaline SQUIBB, NP [address] [place],  [place] [address]  HISTORICAL INFORMATION:  Selected notes from the MEDICAL RECORD NUMBER Referred by Dr. JAMA:  Ocular Hx- PMH-   CURRENT MEDICATIONS: No current outpatient medications on file. (Ophthalmic Drugs)   No current facility-administered medications for this visit. (Ophthalmic Drugs)   Current Outpatient Medications (Other)  Medication Sig   Accu-Chek Softclix Lancets lancets Use to check blood sugar 3 times daily.   acetaminophen  (TYLENOL ) 325 MG tablet Take 2 tablets (650 mg total) by mouth in the morning and at bedtime. (Patient taking differently: Take 325 mg by mouth every 6 (six) hours as needed for mild pain (pain score 1-3) or headache.)   amLODipine  (NORVASC ) 10 MG tablet Take 1 tablet (10 mg total) by mouth daily.   blood glucose meter kit and supplies KIT Dispense based on patient and insurance preference. Use up to four times daily as directed. (FOR ICD-9 250.00, 250.01).   Blood Glucose Monitoring Suppl (ACCU-CHEK GUIDE) w/Device KIT Use to check blood sugar 3 times daily.   Continuous Glucose Receiver (FREESTYLE LIBRE 3 READER) DEVI Use to check blood sugar continuously.   Continuous Glucose Sensor (FREESTYLE LIBRE 3 PLUS SENSOR) MISC Change sensor every 15 days. Use to check blood sugar continuously.   doxycycline  (VIBRA -TABS) 100 MG tablet Take 1 tablet (100 mg total) by mouth 2 (two) times daily.   gabapentin  (NEURONTIN ) 300 MG capsule Take 1 capsule (300  mg total) by mouth 3 (three) times daily for anxiety. (Patient not taking: Reported on 10/18/2023)   glucose blood (ACCU-CHEK GUIDE TEST) test strip Use to check blood sugar 3 times daily.   hydrocortisone  cream 0.5 % Apply 1 Application topically 2 (two) times daily. Apply around neck   insulin  glargine (LANTUS  SOLOSTAR) 100 UNIT/ML Solostar Pen Inject 10 Units into the skin daily.   insulin  lispro (HUMALOG  KWIKPEN) 100 UNIT/ML KwikPen Inject 0.06 mLs (6 Units total) into the skin 2 (two) times daily with a meal. May increase up to 10 units twice daily as directed by your provider.   Insulin  Pen Needle (TRUEPLUS 5-BEVEL PEN NEEDLES) 32G X 4 MM MISC Use to inject Basaglar  once daily.   losartan  (COZAAR ) 50 MG tablet Take 1 tablet (50 mg total) by mouth daily.   metFORMIN  (GLUCOPHAGE -XR) 500 MG 24 hr tablet Take 2 tablets (1,000 mg total) by mouth 2 (two) times daily with a meal.   naltrexone  (DEPADE) 50 MG tablet Take one tablet daily at the same time every day for alcohol  (Patient not taking: Reported on 10/18/2023)   naltrexone  (DEPADE) 50 MG tablet Take 1 tablet (50 mg total) by mouth daily for alcohol . (Patient not taking: Reported on 10/18/2023)   naltrexone  (DEPADE) 50 MG tablet Take 1 tablet (50 mg total) by mouth daily at the same time every day for alcohol  (Patient not taking: Reported on 10/18/2023)   naltrexone  (DEPADE) 50 MG tablet Take 1 tablet (50 mg total) by mouth daily  at the same time for alcohol .   polyethylene glycol (MIRALAX  / GLYCOLAX ) 17 g packet Take 17 g by mouth 2 (two) times daily.   rosuvastatin  (CRESTOR ) 20 MG tablet Take 1 tablet (20 mg total) by mouth daily.   Semaglutide ,0.25 or 0.5MG /DOS, (OZEMPIC , 0.25 OR 0.5 MG/DOSE,) 2 MG/3ML SOPN Inject 0.5 mg into the skin once a week.   traZODone  (DESYREL ) 50 MG tablet Take 1-2 tablets by mouth at bedtime as needed for sleep (Patient not taking: Reported on 10/18/2023)   traZODone  (DESYREL ) 50 MG tablet Take 1-2 tablets (50-100 mg total)  by mouth daily as needed at bedtime for sleep. (Patient not taking: Reported on 10/18/2023)   traZODone  (DESYREL ) 50 MG tablet Take 1-2 tablets (50-100 mg total) by mouth at bedtime for sleep. (Patient not taking: Reported on 10/18/2023)   traZODone  (DESYREL ) 50 MG tablet Take 1-2 tablets (50-100 mg total) by mouth at bedtime as needed for sleep.   witch hazel-glycerin  (TUCKS) pad Apply topically as needed for irritation or hemorrhoids.   No current facility-administered medications for this visit. (Other)   REVIEW OF SYSTEMS:   ALLERGIES Allergies  Allergen Reactions   Lisinopril  Swelling and Other (See Comments)    Lips and mouth became swollen   Penicillins Anaphylaxis, Itching, Swelling and Rash   Ibuprofen  Other (See Comments)    Was told to not take this (because of a prior ulcer)   Tomato Hives and Other (See Comments)    Certain tomato sauces break out the lips in HIVES   PAST MEDICAL HISTORY Past Medical History:  Diagnosis Date   Abscess, perirectal 08/12/2020   Bipolar I disorder, most recent episode depressed (HCC) 09/11/2013   Depression    Essential hypertension 02/03/2017   Hypercholesteremia    Lumbar foraminal stenosis 01/06/2023   Poorly controlled diabetes mellitus (HCC) 01/06/2023   Tobacco use disorder 09/11/2013   Past Surgical History:  Procedure Laterality Date   INCISION AND DRAINAGE ABSCESS  05/2015   Left buttock   INCISION AND DRAINAGE ABSCESS  10/2015   Natal cleft - ED   INCISION AND DRAINAGE ABSCESS  02/2016   natal cleft   INCISION AND DRAINAGE ABSCESS  04/2018   left gluteal abscess   INCISION AND DRAINAGE ABSCESS  03/2021   right gluteal   INCISION AND DRAINAGE INTRA ORAL ABSCESS  2015   INCISION AND DRAINAGE PERIRECTAL ABSCESS N/A 08/02/2020   I&D right perirectal abscess   FAMILY HISTORY Family History  Problem Relation Age of Onset   Healthy Mother    Healthy Father    SOCIAL HISTORY Social History   Tobacco Use   Smoking  status: Every Day    Current packs/day: 0.50    Average packs/day: 0.5 packs/day for 14.0 years (7.0 ttl pk-yrs)    Types: Cigarettes   Smokeless tobacco: Never   Tobacco comments:    1/2 pack a day  Vaping Use   Vaping status: Never Used  Substance Use Topics   Alcohol  use: Yes    Alcohol /week: 14.0 standard drinks of alcohol     Types: 14 Cans of beer per week    Comment: 2 a day   Drug use: No    Types: Marijuana       OPHTHALMIC EXAM:  Not recorded    IMAGING AND PROCEDURES  Imaging and Procedures for 05/15/2024         ASSESSMENT/PLAN:   ICD-10-CM   1. 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 Diabetes mellitus treated with insulin  and oral medication (HCC)  E11.9    Z79.4    Z79.84     3. Hypertensive retinopathy of both eyes  H35.033     4. Cortical age-related cataract of both eyes  H25.013       1. Moderate nonproliferative diabetic retinopathy w/o DME, both eyes - The incidence, risk factors for progression, natural history and treatment options for diabetic retinopathy were discussed with patient.   - The need for close monitoring of blood glucose, blood pressure, and serum lipids, avoiding cigarette or any type of tobacco, and the need for long term follow up was also discussed with patient.  - BCVA 20/20 OU - A1C on 5.23.24 was 9.5 - FA on 5.14.25 shows NPDR OU -- scattered leaking MA, NO NV. - OCT on 5.14.25 shows OD: mild cystic changes and IRHM inf mac. OS: Scattered cystic changesand IRHM greatest inferior mac.  - f/u in 3-4 mos -- DFE/OCT   2,3. Hypertensive retinopathy OU - discussed importance of tight BP control - monitor  4. Mixed Cataract OU - The symptoms of cataract, surgical options, and treatments and risks were discussed with patient. - discussed diagnosis and progression -monitor  Ophthalmic Meds Ordered this visit:  No  orders of the defined types were placed in this encounter.    No follow-ups on file.  There are no Patient Instructions on file for this visit.  Explained the diagnoses, plan, and follow up with the patient and they expressed understanding.  Patient expressed understanding of the importance of proper follow up care.   This document serves as a record of services personally performed by Redell JUDITHANN Hans, MD, PhD. It was created on their behalf by Auston Muzzy, COMT. The creation of this record is the provider's dictation and/or activities during the visit.  Electronically signed by: Auston Muzzy, COMT 05/02/24 2:46 PM    Redell JUDITHANN Hans, M.D., Ph.D. Diseases & Surgery of the Retina and Vitreous [facility] 05/15/2024    Abbreviations: M myopia (nearsighted); A astigmatism; H hyperopia (farsighted); P presbyopia; Mrx spectacle prescription;  CTL contact lenses; OD right eye; OS left eye; OU both eyes  XT exotropia; ET esotropia; PEK punctate epithelial keratitis; PEE punctate epithelial erosions; DES dry eye syndrome; MGD meibomian gland dysfunction; ATs artificial tears; PFAT's preservative free artificial tears; NSC nuclear sclerotic cataract; PSC posterior subcapsular cataract; ERM epi-retinal membrane; PVD posterior vitreous detachment; RD retinal detachment; DM diabetes mellitus; DR diabetic retinopathy; NPDR non-proliferative diabetic retinopathy; PDR proliferative diabetic retinopathy; CSME clinically significant macular edema; DME diabetic macular edema; dbh dot blot hemorrhages; CWS cotton wool spot; POAG primary open angle glaucoma; C/D cup-to-disc ratio; HVF humphrey visual field; GVF goldmann visual field; OCT optical coherence tomography; IOP intraocular pressure; BRVO Branch retinal vein occlusion; CRVO central retinal vein occlusion; CRAO central retinal artery occlusion; BRAO branch retinal artery occlusion; RT retinal tear; SB scleral buckle; PPV pars plana  vitrectomy; VH Vitreous hemorrhage; PRP panretinal laser photocoagulation; IVK intravitreal kenalog; VMT vitreomacular traction; MH Macular hole;  NVD neovascularization of the disc; NVE neovascularization elsewhere; AREDS age related eye disease study; ARMD age related macular degeneration; POAG primary open angle glaucoma; EBMD epithelial/anterior basement membrane dystrophy; ACIOL anterior chamber intraocular lens; IOL intraocular lens; PCIOL posterior chamber intraocular lens; Phaco/IOL phacoemulsification with intraocular lens placement; PRK photorefractive keratectomy; LASIK laser assisted in situ keratomileusis; HTN hypertension; DM  diabetes mellitus; COPD chronic obstructive pulmonary disease

## 2024-05-05 ENCOUNTER — Telehealth: Payer: Self-pay | Admitting: Primary Care

## 2024-05-05 NOTE — Telephone Encounter (Signed)
 Lvm to confirm appt for 9/22

## 2024-05-07 NOTE — Progress Notes (Unsigned)
 S:     No chief complaint on file.  43 y.o. male who presents for diabetes evaluation, education, and management. Patient arrives in *** good spirits and presents without *** any assistance. ***Patient is accompanied by ***.   Patient was referred and last seen by Primary Care Provider, Dr. ***, on ***.  At last visit, ***.   PMH is significant for ***.  Patient reports Diabetes was diagnosed in ***.   Family/Social History: ***  Current diabetes medications include: *** Current hypertension medications include: *** Current hyperlipidemia medications include: ***  Patient reports adherence to taking all medications as prescribed.  *** Patient denies adherence with medications, reports missing *** medications *** times per week, on average.  Do you feel that your medications are working for you? {YES NO:22349} Have you been experiencing any side effects to the medications prescribed? {YES NO:22349} Do you have any problems obtaining medications due to transportation or finances? {YES [id number] Insurance coverage: ***  Patient {Actions; denies-reports:120008} hypoglycemic events.  Reported home fasting blood sugars: ***  Reported 2 hour post-meal/random blood sugars: ***.  Patient {Actions; denies-reports:120008} nocturia (nighttime urination).  Patient {Actions; denies-reports:120008} neuropathy (nerve pain). Patient {Actions; denies-reports:120008} visual changes. Patient {Actions; denies-reports:120008} self foot exams.   Patient reported dietary habits: Eats *** meals/day Breakfast: *** Lunch: *** Dinner: *** Snacks: *** Drinks: ***  Within the past 12 months, did you worry whether your food would run out before you got money to buy more? {YES NO:22349} Within the past 12 months, did the food you bought run out, and you didn't have money to get more? {YES NO:22349} PHQ-9 Score: ***  Patient-reported exercise habits: ***  O:   ROS  Physical Exam  7 day  average blood glucose: ***  Libre3 CGM Download today *** % Time CGM is active: ***% Average Glucose: *** mg/dL Glucose Management Indicator: ***  Glucose Variability: ***% (goal <36%) Time in Goal:  - Time in range 70-180: ***% - Time above range: ***% - Time below range: ***% Observed patterns:  Lab Results  Component Value Date   HGBA1C 8.7 (A) 01/27/2024   There were no vitals filed for this visit.  Lipid Panel     Component Value Date/Time   CHOL 138 09/20/2023 1135   TRIG 81 09/20/2023 1135   HDL 47 09/20/2023 1135   CHOLHDL 2.9 09/20/2023 1135   LDLCALC 75 09/20/2023 1135    Clinical Atherosclerotic Cardiovascular Disease (ASCVD): {YES/NO:21197} The 10-year ASCVD risk score (Arnett DK, et al., 2019) is: 16.6%   Values used to calculate the score:     Age: 36 years     Clincally relevant sex: Male     Is Non-Hispanic African American: Yes     Diabetic: Yes     Tobacco smoker: Yes     Systolic Blood Pressure: 118 mmHg     Is BP treated: Yes     HDL Cholesterol: 47 mg/dL     Total Cholesterol: 138 mg/dL   Patient is participating in a Managed Medicaid Plan:  {MM YES/NO:27447::Yes}   A/P: Diabetes longstanding *** currently ***. Patient is *** able to verbalize appropriate hypoglycemia management plan. Medication adherence appears ***. Control is suboptimal due to ***. -{Meds adjust:18428} basal insulin  *** Lantus /Basaglar /Semglee  (insulin  glargine) *** Tresiba (insulin  degludec) from *** units to *** units daily in the morning. Patient will continue to titrate 1 unit every *** days if fasting blood sugar > 100mg /dl until fasting blood sugars reach goal or next  visit.  -{Meds adjust:18428} rapid insulin  *** Novolog  (insulin  aspart) *** Humalog  (insulin  lispro) from *** to ***.  -{Meds adjust:18428} GLP-1 *** Trulicity  (dulaglutide ) *** Ozempic  (semaglutide ) *** Mounjaro (tirzepatide) from *** mg to *** mg .  -{Meds adjust:18428} SGLT2-I *** Farxiga  (dapagliflozin) *** Jardiance (empagliflozin) 10 mg. Counseled on sick day rules. -{Meds adjust:18428} metformin  ***.  -Patient educated on purpose, proper use, and potential adverse effects of ***.  -Extensively discussed pathophysiology of diabetes, recommended lifestyle interventions, dietary effects on blood sugar control.  -Counseled on s/sx of and management of hypoglycemia.  -Next A1c anticipated ***.   ASCVD risk - primary ***secondary prevention in patient with diabetes. Last LDL is *** not at goal of <29 *** mg/dL. ASCVD risk factors include *** and 10-year ASCVD risk score of ***. {Desc; low/moderate/high:110033} intensity statin indicated.  -{Meds adjust:18428} ***statin *** mg.   Hypertension longstanding *** currently ***. Blood pressure goal of <130/80 *** mmHg. Medication adherence ***. Blood pressure control is suboptimal due to ***. -{Meds adjust:18428} *** mg.  Written patient instructions provided. Patient verbalized understanding of treatment plan.  Total time in face to face counseling *** minutes.    Follow-up:  Pharmacist *** PCP clinic visit in *** Patient seen with ***

## 2024-05-08 ENCOUNTER — Ambulatory Visit: Payer: MEDICAID | Attending: Primary Care | Admitting: Pharmacist

## 2024-05-08 ENCOUNTER — Other Ambulatory Visit: Payer: Self-pay

## 2024-05-08 ENCOUNTER — Encounter: Payer: Self-pay | Admitting: Pharmacist

## 2024-05-08 DIAGNOSIS — Z7984 Long term (current) use of oral hypoglycemic drugs: Secondary | ICD-10-CM

## 2024-05-08 DIAGNOSIS — Z7985 Long-term (current) use of injectable non-insulin antidiabetic drugs: Secondary | ICD-10-CM | POA: Diagnosis not present

## 2024-05-08 DIAGNOSIS — E119 Type 2 diabetes mellitus without complications: Secondary | ICD-10-CM | POA: Diagnosis not present

## 2024-05-08 DIAGNOSIS — Z794 Long term (current) use of insulin: Secondary | ICD-10-CM | POA: Diagnosis not present

## 2024-05-08 LAB — POCT GLYCOSYLATED HEMOGLOBIN (HGB A1C): HbA1c, POC (controlled diabetic range): 7.7 % — AB (ref 0.0–7.0)

## 2024-05-08 MED ORDER — SEMAGLUTIDE (1 MG/DOSE) 4 MG/3ML ~~LOC~~ SOPN
1.0000 mg | PEN_INJECTOR | SUBCUTANEOUS | 1 refills | Status: DC
  Filled 2024-05-08 – 2024-05-22 (×3): qty 3, 28d supply, fill #0

## 2024-05-10 ENCOUNTER — Ambulatory Visit (INDEPENDENT_AMBULATORY_CARE_PROVIDER_SITE_OTHER): Payer: MEDICAID | Admitting: Primary Care

## 2024-05-15 ENCOUNTER — Encounter (INDEPENDENT_AMBULATORY_CARE_PROVIDER_SITE_OTHER): Payer: MEDICAID | Admitting: Ophthalmology

## 2024-05-15 DIAGNOSIS — H35033 Hypertensive retinopathy, bilateral: Secondary | ICD-10-CM

## 2024-05-15 DIAGNOSIS — H25013 Cortical age-related cataract, bilateral: Secondary | ICD-10-CM

## 2024-05-15 DIAGNOSIS — E113393 Type 2 diabetes mellitus with moderate nonproliferative diabetic retinopathy without macular edema, bilateral: Secondary | ICD-10-CM

## 2024-05-15 DIAGNOSIS — E119 Type 2 diabetes mellitus without complications: Secondary | ICD-10-CM

## 2024-05-16 ENCOUNTER — Ambulatory Visit (INDEPENDENT_AMBULATORY_CARE_PROVIDER_SITE_OTHER): Payer: MEDICAID | Admitting: Ophthalmology

## 2024-05-16 ENCOUNTER — Other Ambulatory Visit: Payer: Self-pay

## 2024-05-16 DIAGNOSIS — I10 Essential (primary) hypertension: Secondary | ICD-10-CM

## 2024-05-16 DIAGNOSIS — H35033 Hypertensive retinopathy, bilateral: Secondary | ICD-10-CM | POA: Diagnosis not present

## 2024-05-16 DIAGNOSIS — E119 Type 2 diabetes mellitus without complications: Secondary | ICD-10-CM

## 2024-05-16 DIAGNOSIS — Z7984 Long term (current) use of oral hypoglycemic drugs: Secondary | ICD-10-CM

## 2024-05-16 DIAGNOSIS — H25013 Cortical age-related cataract, bilateral: Secondary | ICD-10-CM

## 2024-05-16 DIAGNOSIS — E113393 Type 2 diabetes mellitus with moderate nonproliferative diabetic retinopathy without macular edema, bilateral: Secondary | ICD-10-CM

## 2024-05-16 DIAGNOSIS — Z794 Long term (current) use of insulin: Secondary | ICD-10-CM | POA: Diagnosis not present

## 2024-05-16 DIAGNOSIS — Z7985 Long-term (current) use of injectable non-insulin antidiabetic drugs: Secondary | ICD-10-CM

## 2024-05-16 NOTE — Progress Notes (Signed)
 [facility] - Clinic Note  05/16/2024   CHIEF COMPLAINT Patient presents for Retina Follow Up  HISTORY OF PRESENT ILLNESS: Brandon Mcneil is a 43 y.o. male who presents to the clinic today for:    HPI     Retina Follow Up   Patient presents with  Diabetic Retinopathy.  In both eyes.  Duration of months.        Comments   Patient states the vision is blurrier and the eyes are watery. He is using AT's PRN. His blood sugar was 228.       Last edited by Myra Wanda SAILOR, COT on 05/16/2024 12:36 PM.     Pt states he's doing well, reports blood sugar control has been good. A1C is 7.7. Pt has been on Ozempic  x 65mo   Referring physician: Celestia Rosaline SQUIBB, NP [address] [place],  [place] [address]  HISTORICAL INFORMATION:  Selected notes from the MEDICAL RECORD NUMBER Referred by Dr. JAMA:  Ocular Hx- PMH-   CURRENT MEDICATIONS: No current outpatient medications on file. (Ophthalmic Drugs)   No current facility-administered medications for this visit. (Ophthalmic Drugs)   Current Outpatient Medications (Other)  Medication Sig   Accu-Chek Softclix Lancets lancets Use to check blood sugar 3 times daily.   acetaminophen  (TYLENOL ) 325 MG tablet Take 2 tablets (650 mg total) by mouth in the morning and at bedtime. (Patient taking differently: Take 325 mg by mouth every 6 (six) hours as needed for mild pain (pain score 1-3) or headache.)   amLODipine  (NORVASC ) 10 MG tablet Take 1 tablet (10 mg total) by mouth daily.   blood glucose meter kit and supplies KIT Dispense based on patient and insurance preference. Use up to four times daily as directed. (FOR ICD-9 250.00, 250.01).   Blood Glucose Monitoring Suppl (ACCU-CHEK GUIDE) w/Device KIT Use to check blood sugar 3 times daily.   Continuous Glucose Receiver (FREESTYLE LIBRE 3 READER) DEVI Use to check blood sugar continuously.   Continuous Glucose Sensor (FREESTYLE LIBRE 3 PLUS SENSOR)  MISC Change sensor every 15 days. Use to check blood sugar continuously.   doxycycline  (VIBRA -TABS) 100 MG tablet Take 1 tablet (100 mg total) by mouth 2 (two) times daily.   gabapentin  (NEURONTIN ) 300 MG capsule Take 1 capsule (300 mg total) by mouth 3 (three) times daily for anxiety. (Patient not taking: Reported on 05/03/2024)   glucose blood (ACCU-CHEK GUIDE TEST) test strip Use to check blood sugar 3 times daily.   hydrocortisone  cream 0.5 % Apply 1 Application topically 2 (two) times daily. Apply around neck   insulin  glargine (LANTUS  SOLOSTAR) 100 UNIT/ML Solostar Pen Inject 10 Units into the skin daily.   insulin  lispro (HUMALOG  KWIKPEN) 100 UNIT/ML KwikPen Inject 0.06 mLs (6 Units total) into the skin 2 (two) times daily with a meal. May increase up to 10 units twice daily as directed by your provider.   Insulin  Pen Needle (TRUEPLUS 5-BEVEL PEN NEEDLES) 32G X 4 MM MISC Use to inject Basaglar  once daily.   losartan  (COZAAR ) 50 MG tablet Take 1 tablet (50 mg total) by mouth daily.   metFORMIN  (GLUCOPHAGE -XR) 500 MG 24 hr tablet Take 2 tablets (1,000 mg total) by mouth 2 (two) times daily with a meal.   naltrexone  (DEPADE) 50 MG tablet Take one tablet daily at the same time every day for alcohol  (Patient not taking: Reported on 05/03/2024)   naltrexone  (DEPADE) 50 MG tablet Take 1 tablet (50  mg total) by mouth daily for alcohol . (Patient not taking: Reported on 05/03/2024)   naltrexone  (DEPADE) 50 MG tablet Take 1 tablet (50 mg total) by mouth daily at the same time every day for alcohol  (Patient not taking: Reported on 10/18/2023)   naltrexone  (DEPADE) 50 MG tablet Take 1 tablet (50 mg total) by mouth daily at the same time for alcohol .   polyethylene glycol (MIRALAX  / GLYCOLAX ) 17 g packet Take 17 g by mouth 2 (two) times daily.   rosuvastatin  (CRESTOR ) 20 MG tablet Take 1 tablet (20 mg total) by mouth daily.   Semaglutide , 1 MG/DOSE, 4 MG/3ML SOPN Inject 1 mg as directed once a week.   traZODone   (DESYREL ) 50 MG tablet Take 1-2 tablets by mouth at bedtime as needed for sleep (Patient not taking: Reported on 05/03/2024)   traZODone  (DESYREL ) 50 MG tablet Take 1-2 tablets (50-100 mg total) by mouth daily as needed at bedtime for sleep. (Patient not taking: Reported on 05/03/2024)   traZODone  (DESYREL ) 50 MG tablet Take 1-2 tablets (50-100 mg total) by mouth at bedtime for sleep. (Patient not taking: Reported on 05/03/2024)   traZODone  (DESYREL ) 50 MG tablet Take 1-2 tablets (50-100 mg total) by mouth at bedtime as needed for sleep.   witch hazel-glycerin  (TUCKS) pad Apply topically as needed for irritation or hemorrhoids.   No current facility-administered medications for this visit. (Other)   REVIEW OF SYSTEMS: ROS   Positive for: Endocrine, Cardiovascular Last edited by Myra Wanda SAILOR, COT on 05/16/2024 12:36 PM.      ALLERGIES Allergies  Allergen Reactions   Lisinopril  Swelling and Other (See Comments)    Lips and mouth became swollen   Penicillins Anaphylaxis, Itching, Swelling and Rash   Ibuprofen  Other (See Comments)    Was told to not take this (because of a prior ulcer)   Tomato Hives and Other (See Comments)    Certain tomato sauces break out the lips in HIVES   PAST MEDICAL HISTORY Past Medical History:  Diagnosis Date   Abscess, perirectal 08/12/2020   Bipolar I disorder, most recent episode depressed (HCC) 09/11/2013   Depression    Essential hypertension 02/03/2017   Hypercholesteremia    Lumbar foraminal stenosis 01/06/2023   Poorly controlled diabetes mellitus (HCC) 01/06/2023   Tobacco use disorder 09/11/2013   Past Surgical History:  Procedure Laterality Date   INCISION AND DRAINAGE ABSCESS  05/2015   Left buttock   INCISION AND DRAINAGE ABSCESS  10/2015   Natal cleft - ED   INCISION AND DRAINAGE ABSCESS  02/2016   natal cleft   INCISION AND DRAINAGE ABSCESS  04/2018   left gluteal abscess   INCISION AND DRAINAGE ABSCESS  03/2021   right  gluteal   INCISION AND DRAINAGE INTRA ORAL ABSCESS  2015   INCISION AND DRAINAGE PERIRECTAL ABSCESS N/A 08/02/2020   I&D right perirectal abscess   FAMILY HISTORY Family History  Problem Relation Age of Onset   Healthy Mother    Healthy Father    SOCIAL HISTORY Social History   Tobacco Use   Smoking status: Every Day    Current packs/day: 0.50    Average packs/day: 0.5 packs/day for 14.0 years (7.0 ttl pk-yrs)    Types: Cigarettes   Smokeless tobacco: Never   Tobacco comments:    1/2 pack a day  Vaping Use   Vaping status: Never Used  Substance Use Topics   Alcohol  use: Yes    Alcohol /week: 14.0 standard drinks of alcohol   Types: 14 Cans of beer per week    Comment: 2 a day   Drug use: No    Types: Marijuana       OPHTHALMIC EXAM:  Base Eye Exam     Visual Acuity (Snellen - Linear)       Right Left   Dist [place] 20/30 20/20   Dist ph [place] 20/20          Tonometry (Tonopen, 12:39 PM)       Right Left   Pressure 14 15         Pupils       Dark Light Shape React APD   Right 4 3 Round Brisk None   Left 4 3 Round Brisk None         Visual Fields       Left Right    Full Full         Extraocular Movement       Right Left    Full, Ortho Full, Ortho         Neuro/Psych     Oriented x3: Yes         Dilation     Both eyes: 1.0% Mydriacyl, 2.5% Phenylephrine @ 12:37 PM           IMAGING AND PROCEDURES  Imaging and Procedures for 05/16/2024         ASSESSMENT/PLAN:   ICD-10-CM   1. 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 Diabetes mellitus treated with insulin  and oral medication (HCC)  E11.9    Z79.4    Z79.84     3. Hypertensive retinopathy of both eyes  H35.033     4. Cortical age-related cataract of both eyes  H25.013        1-3. Moderate nonproliferative diabetic retinopathy w/o  DME, both eyes  - A1C on 9.22.25 was 7.7, 5.23.24 was 9.5 - The incidence, risk factors for progression, natural history and treatment options for diabetic retinopathy were discussed with patient.   - The need for close monitoring of blood glucose, blood pressure, and serum lipids, avoiding cigarette or any type of tobacco, and the need for long term follow up was also discussed with patient.  - BCVA 20/20 OU - FA on 5.14.25 shows NPDR OU -- scattered leaking MA, NO NV. - OCT on 5.14.25 shows OD: persistent cystic changes and IRHM inf mac. OS: Scattered cystic changesand IRHM greatest ST mac.  - f/u in 4-44mos -- DFE/OCT   4,5. Hypertensive retinopathy OU - discussed importance of tight BP control - monitor  6. Mixed Cataract OU - The symptoms of cataract, surgical options, and treatments and risks were discussed with patient. - discussed diagnosis and progression -monitor  Ophthalmic Meds Ordered this visit:  No orders of the defined types were placed in this encounter.    No follow-ups on file.  There are no Patient Instructions on file for this visit.  Explained the diagnoses, plan, and follow up with the patient and they expressed understanding.  Patient expressed understanding of the importance of proper follow up care.   This document serves as a record of services personally performed by Redell JUDITHANN Hans, MD, PhD. It was created on their behalf by Wanda GEANNIE Keens, COT an ophthalmic technician. The creation of this record is the provider's dictation and/or activities during the visit.  Electronically signed by:  Wanda GEANNIE Keens, COT  05/16/24 1:03 PM  This document serves as a record of services personally performed by Redell JUDITHANN Hans, MD, PhD. It was created on their behalf by Almetta Pesa, an ophthalmic technician. The creation of this record is the provider's dictation and/or activities during the visit.    Electronically signed by: Almetta Pesa, OA,  05/16/24  1:10 PM   Redell JUDITHANN Hans, M.D., Ph.D. Diseases & Surgery of the Retina and Vitreous [facility] 05/16/2024    Abbreviations: M myopia (nearsighted); A astigmatism; H hyperopia (farsighted); P presbyopia; Mrx spectacle prescription;  CTL contact lenses; OD right eye; OS left eye; OU both eyes  XT exotropia; ET esotropia; PEK punctate epithelial keratitis; PEE punctate epithelial erosions; DES dry eye syndrome; MGD meibomian gland dysfunction; ATs artificial tears; PFAT's preservative free artificial tears; NSC nuclear sclerotic cataract; PSC posterior subcapsular cataract; ERM epi-retinal membrane; PVD posterior vitreous detachment; RD retinal detachment; DM diabetes mellitus; DR diabetic retinopathy; NPDR non-proliferative diabetic retinopathy; PDR proliferative diabetic retinopathy; CSME clinically significant macular edema; DME diabetic macular edema; dbh dot blot hemorrhages; CWS cotton wool spot; POAG primary open angle glaucoma; C/D cup-to-disc ratio; HVF humphrey visual field; GVF goldmann visual field; OCT optical coherence tomography; IOP intraocular pressure; BRVO Branch retinal vein occlusion; CRVO central retinal vein occlusion; CRAO central retinal artery occlusion; BRAO branch retinal artery occlusion; RT retinal tear; SB scleral buckle; PPV pars plana vitrectomy; VH Vitreous hemorrhage; PRP panretinal laser photocoagulation; IVK intravitreal kenalog; VMT vitreomacular traction; MH Macular hole;  NVD neovascularization of the disc; NVE neovascularization elsewhere; AREDS age related eye disease study; ARMD age related macular degeneration; POAG primary open angle glaucoma; EBMD epithelial/anterior basement membrane dystrophy; ACIOL anterior chamber intraocular lens; IOL intraocular lens; PCIOL posterior chamber intraocular lens; Phaco/IOL phacoemulsification with intraocular lens placement; PRK photorefractive keratectomy; LASIK laser assisted in situ  keratomileusis; HTN hypertension; DM diabetes mellitus; COPD chronic obstructive pulmonary disease

## 2024-05-17 ENCOUNTER — Encounter (INDEPENDENT_AMBULATORY_CARE_PROVIDER_SITE_OTHER): Payer: Self-pay | Admitting: Ophthalmology

## 2024-05-18 ENCOUNTER — Encounter: Payer: Self-pay | Admitting: Primary Care

## 2024-05-22 ENCOUNTER — Other Ambulatory Visit: Payer: Self-pay

## 2024-05-25 ENCOUNTER — Other Ambulatory Visit: Payer: Self-pay

## 2024-05-30 ENCOUNTER — Other Ambulatory Visit: Payer: Self-pay | Admitting: Family Medicine

## 2024-05-30 ENCOUNTER — Other Ambulatory Visit: Payer: Self-pay

## 2024-05-30 ENCOUNTER — Telehealth (INDEPENDENT_AMBULATORY_CARE_PROVIDER_SITE_OTHER): Payer: Self-pay | Admitting: Primary Care

## 2024-05-30 ENCOUNTER — Other Ambulatory Visit: Payer: Self-pay | Admitting: Pharmacist

## 2024-05-30 DIAGNOSIS — E1165 Type 2 diabetes mellitus with hyperglycemia: Secondary | ICD-10-CM

## 2024-05-30 MED ORDER — LANTUS SOLOSTAR 100 UNIT/ML ~~LOC~~ SOPN
15.0000 [IU] | PEN_INJECTOR | Freq: Every day | SUBCUTANEOUS | 2 refills | Status: DC
Start: 2024-05-30 — End: 2024-06-09
  Filled 2024-05-30: qty 6, 40d supply, fill #0

## 2024-05-30 NOTE — Telephone Encounter (Signed)
 Pt came in stating he needed paperwork filled out and processed. I told pt someone would reach out once paperwork is complete and ready for pick up.

## 2024-06-08 ENCOUNTER — Other Ambulatory Visit: Payer: Self-pay

## 2024-06-09 ENCOUNTER — Encounter: Payer: Self-pay | Admitting: Pharmacist

## 2024-06-09 ENCOUNTER — Other Ambulatory Visit: Payer: Self-pay

## 2024-06-09 ENCOUNTER — Ambulatory Visit: Payer: MEDICAID | Attending: Primary Care | Admitting: Pharmacist

## 2024-06-09 DIAGNOSIS — E119 Type 2 diabetes mellitus without complications: Secondary | ICD-10-CM | POA: Diagnosis not present

## 2024-06-09 DIAGNOSIS — E1165 Type 2 diabetes mellitus with hyperglycemia: Secondary | ICD-10-CM

## 2024-06-09 DIAGNOSIS — Z794 Long term (current) use of insulin: Secondary | ICD-10-CM | POA: Diagnosis not present

## 2024-06-09 DIAGNOSIS — Z7984 Long term (current) use of oral hypoglycemic drugs: Secondary | ICD-10-CM

## 2024-06-09 DIAGNOSIS — Z7985 Long-term (current) use of injectable non-insulin antidiabetic drugs: Secondary | ICD-10-CM

## 2024-06-09 MED ORDER — LANTUS SOLOSTAR 100 UNIT/ML ~~LOC~~ SOPN
18.0000 [IU] | PEN_INJECTOR | Freq: Every day | SUBCUTANEOUS | 2 refills | Status: DC
  Filled 2024-06-09: qty 6, 33d supply, fill #0
  Filled 2024-07-07: qty 6, 33d supply, fill #1
  Filled ????-??-??: fill #1

## 2024-06-09 MED ORDER — INSULIN LISPRO (1 UNIT DIAL) 100 UNIT/ML (KWIKPEN)
4.0000 [IU] | PEN_INJECTOR | Freq: Two times a day (BID) | SUBCUTANEOUS | 0 refills | Status: AC
  Filled 2024-06-09: qty 6, 56d supply, fill #0
  Filled 2024-07-03: qty 3, 28d supply, fill #0
  Filled 2024-07-28 (×2): qty 3, 28d supply, fill #1

## 2024-06-09 MED ORDER — SEMAGLUTIDE (2 MG/DOSE) 8 MG/3ML ~~LOC~~ SOPN
2.0000 mg | PEN_INJECTOR | SUBCUTANEOUS | 2 refills | Status: DC
  Filled 2024-06-09 – 2024-07-03 (×2): qty 3, 28d supply, fill #0
  Filled 2024-07-28 (×2): qty 3, 28d supply, fill #1

## 2024-06-09 NOTE — Progress Notes (Signed)
 S:     No chief complaint on file.  43 y.o. male who presents for diabetes evaluation, education, and management. PMH is significant for T2DM w/ hx of DKA, HTN, bipolar I, tobacco use, alcohol  use. Patient was referred and last seen by Primary Care Provider, Dr. Rosaline Bohr, on 04/27/2024.  Patient saw pharmacy 05/08/24. At that visit, A1c was 7.7%. We adjusted his insulin  and had him start Ozempic  0.5 mg weekly with instructions to increase to 1 mg weekly after 4 injections of the 0.5 mg dose.   Today, patient arrives in good spirits and presents without any assistance. He brings in his phone for Equality review. He denies any side effects with Ozempic  1 mg. Picked up and started taking 05/30/2024. He has been taking metformin  as 2 tablets BID, Lantus  15 units once daily, and Humalog  4 units with breakfast and dinner.  Family/Social History:  Fhx: no pertinent positives  Tobacco: current 0.5 PPD smoker Alcohol : 2 cans of beer weekly   Current diabetes medications include: Lantus  15 units daily, Humalog  4 units BID, Ozempic  1 mg weekly, metformin  1000 mg BID (taking one 500 mg tablet BID) Current hypertension medications include: amlodipine  10 mg, losartan  50 mg Current hyperlipidemia medications include: rosuvastatin  20 mg  Insurance coverage: [employer]  Patient denies hypoglycemic events.  Patient denies nocturia (nighttime urination).  Patient denies neuropathy (nerve pain). Patient denies visual changes.  Patient reports self foot exams.   Patient reported dietary habits: Eats 2 meals/day -Tries to adhere to a diabetic diet - Endorses meals with large servings of carbohydrates like spaghetti - Eats Cookout  Patient-reported exercise habits: not exercise recently  O:   Date of Download: 06/09/2024 % Time CGM is active: 89% Average Glucose: 194 mg/dL Glucose Management Indicator: 8.0  Glucose Variability: 22.7 (goal <36%) Time in Goal:  - Time in range  70-180: 40% - Time above range: 60% - Time below range: 0%  Lab Results  Component Value Date   HGBA1C 7.7 (A) 05/08/2024   There were no vitals filed for this visit.  Lipid Panel     Component Value Date/Time   CHOL 138 09/20/2023 1135   TRIG 81 09/20/2023 1135   HDL 47 09/20/2023 1135   CHOLHDL 2.9 09/20/2023 1135   LDLCALC 75 09/20/2023 1135    Clinical Atherosclerotic Cardiovascular Disease (ASCVD): No  The 10-year ASCVD risk score (Arnett DK, et al., 2019) is: 16.6%   Values used to calculate the score:     Age: 1 years     Clincally relevant sex: Male     Is Non-Hispanic African American: Yes     Diabetic: Yes     Tobacco smoker: Yes     Systolic Blood Pressure: 118 mmHg     Is BP treated: Yes     HDL Cholesterol: 47 mg/dL     Total Cholesterol: 138 mg/dL   Patient is participating in a Managed Medicaid Plan:  Yes, [employer] Plan  A/P: Diabetes longstanding is currently above goal with last A1c 7.7%, however, this is improving. AGP report shows slightly worsening glycemic control at home. Patient has been adherent to medications. He is not currently hypoglycemic but is able to verbalize appropriate hypoglycemia management plan. Encouraged patient to medications as directed.  -INCREASE Lantus  (insulin  glargine) to 18 units daily in the morning.  -Continue Humalog  (insulin  lispro) 4 units with breakfast and dinner.  -Continue Ozempic  (semaglutide ) 1 mg weekly for 2-3 more weeks until current  supply exhausted. Then, increase to 2 mg weekly thereafter.  -Continued metformin  500 mg as 1 tab (500 mg) BID.  -Patient educated on purpose, proper use, and potential adverse effects of insulin , metformin , and Ozempic .  -Extensively discussed pathophysiology of diabetes, recommended lifestyle interventions, dietary effects on blood sugar control.  -Counseled on s/sx of and management of hypoglycemia.  -Next A1c anticipated 07/2024.   Written patient instructions  provided. Patient verbalized understanding of treatment plan.  Total time in face to face counseling 30 minutes.    Follow-up:  Pharmacist in 1 month   Herlene Fleeta Morris, PharmD, [place], CPP Clinical Pharmacist [facility] [phone]

## 2024-06-16 ENCOUNTER — Ambulatory Visit (HOSPITAL_COMMUNITY): Payer: MEDICAID | Admitting: Physician Assistant

## 2024-06-20 ENCOUNTER — Other Ambulatory Visit: Payer: Self-pay

## 2024-07-03 ENCOUNTER — Encounter (HOSPITAL_COMMUNITY): Payer: Self-pay | Admitting: Psychiatry

## 2024-07-03 ENCOUNTER — Ambulatory Visit (INDEPENDENT_AMBULATORY_CARE_PROVIDER_SITE_OTHER): Payer: MEDICAID | Admitting: Psychiatry

## 2024-07-03 ENCOUNTER — Other Ambulatory Visit: Payer: Self-pay

## 2024-07-03 VITALS — BP 126/81 | HR 84 | Temp 98.1°F | Wt 238.2 lb

## 2024-07-03 DIAGNOSIS — Z720 Tobacco use: Secondary | ICD-10-CM | POA: Diagnosis not present

## 2024-07-03 DIAGNOSIS — F1094 Alcohol use, unspecified with alcohol-induced mood disorder: Secondary | ICD-10-CM | POA: Diagnosis not present

## 2024-07-03 DIAGNOSIS — F411 Generalized anxiety disorder: Secondary | ICD-10-CM | POA: Diagnosis not present

## 2024-07-03 MED ORDER — TRAZODONE HCL 100 MG PO TABS
100.0000 mg | ORAL_TABLET | Freq: Every evening | ORAL | 3 refills | Status: AC | PRN
  Filled 2024-07-03: qty 30, 30d supply, fill #0

## 2024-07-03 MED ORDER — BUPROPION HCL ER (XL) 150 MG PO TB24
150.0000 mg | ORAL_TABLET | ORAL | 3 refills | Status: AC
  Filled 2024-07-03: qty 30, 30d supply, fill #0
  Filled 2024-07-28: qty 30, 30d supply, fill #1

## 2024-07-03 MED ORDER — NALTREXONE HCL 50 MG PO TABS
50.0000 mg | ORAL_TABLET | Freq: Every day | ORAL | 3 refills | Status: AC
  Filled 2024-07-03: qty 30, 30d supply, fill #0
  Filled 2024-07-28: qty 30, 30d supply, fill #1

## 2024-07-03 MED ORDER — GABAPENTIN 300 MG PO CAPS
300.0000 mg | ORAL_CAPSULE | Freq: Three times a day (TID) | ORAL | 3 refills | Status: AC
  Filled 2024-07-03: qty 90, 30d supply, fill #0
  Filled 2024-07-28: qty 90, 30d supply, fill #1

## 2024-07-03 NOTE — Progress Notes (Signed)
 Psychiatric Initial Adult Assessment   Patient Identification: Brandon Mcneil MRN:  [id number] Date of Evaluation:  07/03/2024 Referral Source: Rosaline Bohr, NP Chief Complaint:  I have been anxious about my brother Visit Diagnosis:    ICD-10-CM   1. Alcohol -induced mood disorder (HCC)  F10.94 naltrexone  (DEPADE) 50 MG tablet    gabapentin  (NEURONTIN ) 300 MG capsule    buPROPion (WELLBUTRIN XL) 150 MG 24 hr tablet    2. Generalized anxiety disorder  F41.1 traZODone  (DESYREL ) 100 MG tablet    3. Tobacco use  Z72.0 buPROPion (WELLBUTRIN XL) 150 MG 24 hr tablet      History of Present Illness: 43 year old male seen today for initial psychiatric evaluation.  He has a psychiatric history of tobacco use, bipolar 1, alcohol  use, tobacco use, and depression.  Currently is managed on gabapentin  300 mg 3 times daily (reports that he takes it once daily), naltrexone  50 mg daily, and trazodone  50-100 mg nightly (patient notes that he has only been taking 50 mg).  He informed clinical research associate that his medications are somewhat effective in managing his psychiatric conditions.  Today he is well-groomed, pleasant, cooperative, and engaged in conversation.  He informed clinical research associate that he is concerned about his brother who was recently diagnosed with colon cancer.  He reports that his brother has been financially struggling and he has been attempting to help but at times feels helpless.  He notes that he and his brother live with their mother.  He reports that they are behind on the bills.  Recently he started working at [employer] but notes that at times the funds are not enough.  He also notes that his disability is pending and is fearful that will be denied because he is working.  Patient notes that his brother is behind on his car payment and he notes that he cannot afford to help his brother pay.  Patient also reports that recently he and his fiance broke up.  He notes that when she is sick  she felt that he did not show up for her.  Patient reports that this exacerbates his anxiety.  Today provider conducted GAD-7 and patient scored a 16.  Provider also conducted PHQ-9 up he scored a 9.  Patient notes that he sleeps approximately 4 hours.  Today he denies SI/HI/AVH or paranoia.  Patient does note that at times he is distracted, has increased irritability, and fluctuations in mood.  To cope with his stress patient notes that he drinks alcohol .  Provider conducted an audit assessment patient scored a 12.  He also notes that he smokes a pack of cigarettes every few days.   Today patient agreeable to make taking gabapentin  300 mg 3 times daily to help manage anxiety and alcohol  dependence.  Patient reports that he was only taking it once daily.  He is also agreeable to start Wellbutrin XL 150 mg to help manage depression and tobacco dependence.  He will continue naltrexone  as prescribed.  Provider discussed naltrexone  injection however patient was not agreeable.  Patient was taking trazodone  50 mg and is agreeable to increasing to 100 mg to help manage sleep. Potential side effects of medication and risks vs benefits of treatment vs non-treatment were explained and discussed. All questions were answered.  No other concerns at this time.   Associated Signs/Symptoms: Depression Symptoms:  depressed mood, insomnia, fatigue, feelings of worthlessness/guilt, difficulty concentrating, anxiety, loss of energy/fatigue, (Hypo) Manic Symptoms:  Distractibility, Elevated Mood, Irritable Mood, Anxiety Symptoms:  Excessive Worry, Psychotic Symptoms:  Denies PTSD Symptoms: Had a traumatic exposure:  Reports brother has cancer  Past Psychiatric History: Bipolar 1, Alcohol  use, depression  Previous Psychotropic Medications: gabapentin ,trazodone , naltrexone     Substance Abuse History in the last 12 months:  No.  Consequences of Substance Abuse: NA  Past Medical History:  Past Medical  History:  Diagnosis Date   Abscess, perirectal 08/12/2020   Bipolar I disorder, most recent episode depressed (HCC) 09/11/2013   Depression    Essential hypertension 02/03/2017   Hypercholesteremia    Lumbar foraminal stenosis 01/06/2023   Poorly controlled diabetes mellitus (HCC) 01/06/2023   Tobacco use disorder 09/11/2013    Past Surgical History:  Procedure Laterality Date   INCISION AND DRAINAGE ABSCESS  05/2015   Left buttock   INCISION AND DRAINAGE ABSCESS  10/2015   Natal cleft - ED   INCISION AND DRAINAGE ABSCESS  02/2016   natal cleft   INCISION AND DRAINAGE ABSCESS  04/2018   left gluteal abscess   INCISION AND DRAINAGE ABSCESS  03/2021   right gluteal   INCISION AND DRAINAGE INTRA ORAL ABSCESS  2015   INCISION AND DRAINAGE PERIRECTAL ABSCESS N/A 08/02/2020   I&D right perirectal abscess    Family Psychiatric History: Denies  Family History:  Family History  Problem Relation Age of Onset   Healthy Mother    Healthy Father     Social History:   Social History   Socioeconomic History   Marital status: Single    Spouse name: Not on file   Number of children: 3   Years of education: 10th   Highest education level: Not on file  Occupational History   Occupation: unemployed  Tobacco Use   Smoking status: Every Day    Current packs/day: 0.50    Average packs/day: 0.5 packs/day for 14.0 years (7.0 ttl pk-yrs)    Types: Cigarettes   Smokeless tobacco: Never   Tobacco comments:    1/2 pack a day  Vaping Use   Vaping status: Never Used  Substance and Sexual Activity   Alcohol  use: Yes    Alcohol /week: 14.0 standard drinks of alcohol     Types: 14 Cans of beer per week    Comment: 2 a day   Drug use: No    Types: Marijuana   Sexual activity: Yes    Partners: Female    Birth control/protection: None  Other Topics Concern   Not on file  Social History Narrative   Originally from [place]   Did not finish [employer] to get GED    Lives with his mother, his sister and brother.   Social Drivers of Corporate Investment Banker Strain: Not on file  Food Insecurity: No Food Insecurity (02/21/2024)   Hunger Vital Sign    Worried About Running Out of Food in the Last Year: Never true    Ran Out of Food in the Last Year: Never true  Transportation Needs: No Transportation Needs (02/21/2024)   PRAPARE - Administrator, Civil Service (Medical): No    Lack of Transportation (Non-Medical): No  Physical Activity: Not on file  Stress: Not on file  Social Connections: Not on file    Additional Social History: Patient resides in [place] with his brother and mother. He was engaged but notes that they have separated and has two older children. He works at the [employer]. He drinks alcohol  and tobacco (1 pack every 3 days). He denies  illegal drug use.   Allergies:   Allergies  Allergen Reactions   Lisinopril  Swelling and Other (See Comments)    Lips and mouth became swollen   Penicillins Anaphylaxis, Itching, Swelling and Rash   Ibuprofen  Other (See Comments)    Was told to not take this (because of a prior ulcer)   Tomato Hives and Other (See Comments)    Certain tomato sauces break out the lips in HIVES    Metabolic Disorder Labs: Lab Results  Component Value Date   HGBA1C 7.7 (A) 05/08/2024   MPG 226 01/07/2023   MPG 111 01/31/2014   No results found for: PROLACTIN Lab Results  Component Value Date   CHOL 138 09/20/2023   TRIG 81 09/20/2023   HDL 47 09/20/2023   CHOLHDL 2.9 09/20/2023   LDLCALC 75 09/20/2023   LDLCALC 37 07/23/2022   No results found for: TSH  Therapeutic Level Labs: No results found for: LITHIUM No results found for: CBMZ No results found for: VALPROATE  Current Medications: Current Outpatient Medications  Medication Sig Dispense Refill   buPROPion (WELLBUTRIN XL) 150 MG 24 hr tablet Take 1 tablet (150 mg total) by mouth every morning. 30 tablet 3    Accu-Chek Softclix Lancets lancets Use to check blood sugar 3 times daily. 100 each 6   acetaminophen  (TYLENOL ) 325 MG tablet Take 2 tablets (650 mg total) by mouth in the morning and at bedtime. (Patient taking differently: Take 325 mg by mouth every 6 (six) hours as needed for mild pain (pain score 1-3) or headache.) 60 tablet 0   amLODipine  (NORVASC ) 10 MG tablet Take 1 tablet (10 mg total) by mouth daily. 90 tablet 3   blood glucose meter kit and supplies KIT Dispense based on patient and insurance preference. Use up to four times daily as directed. (FOR ICD-9 250.00, 250.01). 1 each 0   Blood Glucose Monitoring Suppl (ACCU-CHEK GUIDE) w/Device KIT Use to check blood sugar 3 times daily. 1 kit 0   Continuous Glucose Receiver (FREESTYLE LIBRE 3 READER) DEVI Use to check blood sugar continuously. 1 each 0   Continuous Glucose Sensor (FREESTYLE LIBRE 3 PLUS SENSOR) MISC Change sensor every 15 days. Use to check blood sugar continuously. 2 each 6   doxycycline  (VIBRA -TABS) 100 MG tablet Take 1 tablet (100 mg total) by mouth 2 (two) times daily. 14 tablet 0   gabapentin  (NEURONTIN ) 300 MG capsule Take 1 capsule (300 mg total) by mouth 3 (three) times daily for anxiety. 90 capsule 3   glucose blood (ACCU-CHEK GUIDE TEST) test strip Use to check blood sugar 3 times daily. 100 each 6   hydrocortisone  cream 0.5 % Apply 1 Application topically 2 (two) times daily. Apply around neck 30 g 0   insulin  glargine (LANTUS  SOLOSTAR) 100 UNIT/ML Solostar Pen Inject 18 Units into the skin daily. 6 mL 2   insulin  lispro (HUMALOG  KWIKPEN) 100 UNIT/ML KwikPen Inject 4 Units into the skin 2 (two) times daily. 15 mL 0   Insulin  Pen Needle (TRUEPLUS 5-BEVEL PEN NEEDLES) 32G X 4 MM MISC Use to inject Basaglar  once daily. 100 each 2   losartan  (COZAAR ) 50 MG tablet Take 1 tablet (50 mg total) by mouth daily. 90 tablet 3   metFORMIN  (GLUCOPHAGE -XR) 500 MG 24 hr tablet Take 2 tablets (1,000 mg total) by mouth 2 (two) times  daily with a meal. 360 tablet 3   naltrexone  (DEPADE) 50 MG tablet Take 1 tablet (50 mg total) by mouth daily  at the same time every day for alcohol  (Patient not taking: Reported on 10/18/2023) 60 tablet 0   naltrexone  (DEPADE) 50 MG tablet Take 1 tablet (50 mg total) by mouth daily. 30 tablet 3   polyethylene glycol (MIRALAX  / GLYCOLAX ) 17 g packet Take 17 g by mouth 2 (two) times daily. 14 each 0   rosuvastatin  (CRESTOR ) 20 MG tablet Take 1 tablet (20 mg total) by mouth daily. 90 tablet 2   Semaglutide , 2 MG/DOSE, 8 MG/3ML SOPN Inject 2 mg as directed once a week. 3 mL 2   traZODone  (DESYREL ) 100 MG tablet Take 1 tablet by mouth at bedtime as needed. 30 tablet 3   traZODone  (DESYREL ) 50 MG tablet Take 1-2 tablets (50-100 mg total) by mouth at bedtime for sleep. (Patient not taking: Reported on 05/03/2024) 120 tablet 0   witch hazel-glycerin  (TUCKS) pad Apply topically as needed for irritation or hemorrhoids. 40 each 0   No current facility-administered medications for this visit.    Musculoskeletal: Strength & Muscle Tone: within normal limits Gait & Station: normal Patient leans: N/A  Psychiatric Specialty Exam: Review of Systems  There were no vitals taken for this visit.There is no height or weight on file to calculate BMI.  General Appearance: Well Groomed  Eye Contact:  Good  Speech:  Clear and Coherent and Normal Rate  Volume:  Normal  Mood:  Anxious and Depressed  Affect:  Appropriate and Congruent  Thought Process:  Coherent, Goal Directed, and Linear  Orientation:  Full (Time, Place, and Person)  Thought Content:  WDL and Logical  Suicidal Thoughts:  No  Homicidal Thoughts:  No  Memory:  Immediate;   Good Recent;   Good Remote;   Good  Judgement:  Good  Insight:  Good  Psychomotor Activity:  Normal  Concentration:  Concentration: Good and Attention Span: Good  Recall:  Good  Fund of Knowledge:Good  Language: Good  Akathisia:  No  Handed:  Right  AIMS (if  indicated):  not done  Assets:  Communication Skills Desire for Improvement Financial Resources/Insurance Intimacy Physical Health Social Support Transportation  ADL's:  Intact  Cognition: WNL  Sleep:  Good   Screenings: AUDIT    Flowsheet Row Office Visit from 07/03/2024 in [facility]  Alcohol  Use Disorder Identification Test Final Score (AUDIT) 12   GAD-7    Flowsheet Row Office Visit from 07/03/2024 in [facility] Office Visit from 04/27/2024 in [facility] Office Visit from 06/18/2023 in [facility] Office Visit from 02/15/2023 in [facility] Office Visit from 01/25/2023 in [facility]  Total GAD-7 Score 16 0 0 0 0   PHQ2-9    Flowsheet Row Office Visit from 07/03/2024 in [facility] Office Visit from 04/27/2024 in [facility] Office Visit from 02/21/2024 in [facility] Office Visit from 09/20/2023 in [facility] Office Visit from 06/18/2023 in [facility]  PHQ-2 Total Score 3 0 0 0 0  PHQ-9 Total Score 9 0 -- -- 0   Flowsheet Row ED from 05/09/2023 in [facility] from 02/16/2023 in [facility] ED to Hosp-Admission (Discharged) from 01/06/2023 in [facility]  C-SSRS RISK CATEGORY No Risk No Risk No Risk    Assessment and Plan: Patient endorses increased anxiety,  depression, and poor sleep.  He also struggles with alcohol  use.  Today patient agreeable to make taking gabapentin  300 mg 3 times daily to help manage anxiety and alcohol  dependence.  Patient reports that he was only taking it once daily.  He is also agreeable to start Wellbutrin XL 150 mg to help manage depression and tobacco dependence.  He will continue  naltrexone  as prescribed.  Patient was taking trazodone  50 mg and is agreeable to increasing to 100 mg to help manage sleep.  1. Alcohol -induced mood disorder (HCC) (Primary)  Continue- naltrexone  (DEPADE) 50 MG tablet; Take 1 tablet (50 mg total) by mouth daily.  Dispense: 30 tablet; Refill: 3 Continue- gabapentin  (NEURONTIN ) 300 MG capsule; Take 1 capsule (300 mg total) by mouth 3 (three) times daily for anxiety.  Dispense: 90 capsule; Refill: 3 Start- buPROPion (WELLBUTRIN XL) 150 MG 24 hr tablet; Take 1 tablet (150 mg total) by mouth every morning.  Dispense: 30 tablet; Refill: 3  2. Generalized anxiety disorder  Increased- traZODone  (DESYREL ) 100 MG tablet; Take 1 tablet by mouth at bedtime as needed.  Dispense: 30 tablet; Refill: 3  3. Tobacco use  Start- buPROPion (WELLBUTRIN XL) 150 MG 24 hr tablet; Take 1 tablet (150 mg total) by mouth every morning.  Dispense: 30 tablet; Refill: 3     Collaboration of Care: Other provider involved in patient's care AEB PCP  Patient/Guardian was advised Release of Information must be obtained prior to any record release in order to collaborate their care with an outside provider. Patient/Guardian was advised if they have not already done so to contact the registration department to sign all necessary forms in order for us  to release information regarding their care.   Consent: Patient/Guardian gives verbal consent for treatment and assignment of benefits for services provided during this visit. Patient/Guardian expressed understanding and agreed to proceed.   Follow-up in 2 months  Zane FORBES Bach, NP 11/17/202512:29 PM

## 2024-07-07 ENCOUNTER — Other Ambulatory Visit: Payer: Self-pay

## 2024-07-07 MED ORDER — NALTREXONE HCL 50 MG PO TABS
50.0000 mg | ORAL_TABLET | Freq: Every day | ORAL | 1 refills | Status: DC
  Filled 2024-07-28: qty 30, 30d supply, fill #0

## 2024-07-07 MED ORDER — TRAZODONE HCL 100 MG PO TABS
100.0000 mg | ORAL_TABLET | Freq: Every evening | ORAL | 1 refills | Status: DC | PRN
  Filled 2024-07-07: qty 30, 30d supply, fill #0

## 2024-07-07 MED ORDER — BUPROPION HCL ER (XL) 150 MG PO TB24: 150.0000 mg | ORAL_TABLET | Freq: Every morning | ORAL | 1 refills | Status: DC

## 2024-07-07 MED ORDER — GABAPENTIN 300 MG PO CAPS
300.0000 mg | ORAL_CAPSULE | Freq: Three times a day (TID) | ORAL | 1 refills | Status: DC
  Filled 2024-07-28: qty 90, 30d supply, fill #0

## 2024-07-11 ENCOUNTER — Other Ambulatory Visit: Payer: Self-pay

## 2024-07-11 ENCOUNTER — Ambulatory Visit: Payer: MEDICAID | Attending: Primary Care | Admitting: Pharmacist

## 2024-07-11 ENCOUNTER — Encounter: Payer: Self-pay | Admitting: Pharmacist

## 2024-07-11 DIAGNOSIS — Z794 Long term (current) use of insulin: Secondary | ICD-10-CM | POA: Diagnosis not present

## 2024-07-11 DIAGNOSIS — Z7984 Long term (current) use of oral hypoglycemic drugs: Secondary | ICD-10-CM | POA: Diagnosis not present

## 2024-07-11 DIAGNOSIS — Z7985 Long-term (current) use of injectable non-insulin antidiabetic drugs: Secondary | ICD-10-CM | POA: Diagnosis not present

## 2024-07-11 DIAGNOSIS — E1165 Type 2 diabetes mellitus with hyperglycemia: Secondary | ICD-10-CM | POA: Diagnosis not present

## 2024-07-11 MED ORDER — LANTUS SOLOSTAR 100 UNIT/ML ~~LOC~~ SOPN
20.0000 [IU] | PEN_INJECTOR | Freq: Every day | SUBCUTANEOUS | 2 refills | Status: AC
  Filled 2024-07-11 – 2024-09-08 (×4): qty 6, 30d supply, fill #0

## 2024-07-11 NOTE — Progress Notes (Signed)
 S:     No chief complaint on file.  43 y.o. male who presents for diabetes evaluation, education, and management. PMH is significant for T2DM w/ hx of DKA, HTN, bipolar I, tobacco use, alcohol  use. Patient was referred and last seen by Primary Care Provider, Dr. Rosaline Bohr, on 04/27/2024.  Patient saw pharmacy 06/09/24. At that visit, I increased his Lantus  to 18 units daily. I also instructed him to begin taking Ozempic  2 mg after exhausting his supply of 1 mg pens. I continued his Humalog  and metformin .    Today, patient arrives in good spirits and presents without any assistance. He brings in his phone for [place] review. He denies any side effects with Ozempic  2 mg. Picked up and started taking 07/03/24. He has been taking metformin , Lantus , and Humalog .   Family/Social History:  Fhx: no pertinent positives  Tobacco: current 0.5 PPD smoker Alcohol : 2 cans of beer weekly   Current diabetes medications include: Lantus  15 units daily, Humalog  4 units BID, Ozempic  1 mg weekly, metformin  1000 mg XR BID (taking one 500 mg XR tablet BID) Current hypertension medications include: amlodipine  10 mg, losartan  50 mg Current hyperlipidemia medications include: rosuvastatin  20 mg  Insurance coverage: [employer]  Patient denies hypoglycemic events.  Patient denies nocturia (nighttime urination).  Patient denies neuropathy (nerve pain). Patient denies visual changes.  Patient reports self foot exams.   Patient reported dietary habits: Eats 2 meals/day -Tries to adhere to a diabetic diet - Endorses meals with large servings of carbohydrates like spaghetti - Eats [employer]  Patient-reported exercise habits: not exercise recently  O:   Date of Download: 07/11/24, 28 day report % Time CGM is active: 85% Average Glucose: 182 mg/dL Glucose Management Indicator: 7.7  Glucose Variability: 21 (goal <36%) Time in Goal:  - Time in range 70-180: 53% - Time above range: 47% -  Time below range: 0%  Lab Results  Component Value Date   HGBA1C 7.7 (A) 05/08/2024   There were no vitals filed for this visit.  Lipid Panel     Component Value Date/Time   CHOL 138 09/20/2023 1135   TRIG 81 09/20/2023 1135   HDL 47 09/20/2023 1135   CHOLHDL 2.9 09/20/2023 1135   LDLCALC 75 09/20/2023 1135    Clinical Atherosclerotic Cardiovascular Disease (ASCVD): No  The 10-year ASCVD risk score (Arnett DK, et al., 2019) is: 18.7%   Values used to calculate the score:     Age: 24 years     Clincally relevant sex: Male     Is Non-Hispanic African American: Yes     Diabetic: Yes     Tobacco smoker: Yes     Systolic Blood Pressure: 126 mmHg     Is BP treated: Yes     HDL Cholesterol: 47 mg/dL     Total Cholesterol: 138 mg/dL   Patient is participating in a Managed Medicaid Plan:  Yes, [employer]  A/P: Diabetes longstanding is currently above goal with last A1c 7.7%, however, this is improving. AGP report shows improving glycemic control at home compared to last month. Patient has been adherent to medications. He is not currently hypoglycemic but is able to verbalize appropriate hypoglycemia management plan. Encouraged patient to medications as directed.  -INCREASE Lantus  (insulin  glargine) to 20 units daily in the morning.  -Continue Humalog  (insulin  lispro) 4 units with breakfast and dinner.  -Continue Ozempic  (semaglutide ) 2 mg weekly.  -Continued metformin  500 mg as 1 tab (500  mg) BID.  -Patient educated on purpose, proper use, and potential adverse effects of insulin , metformin , and Ozempic .  -Extensively discussed pathophysiology of diabetes, recommended lifestyle interventions, dietary effects on blood sugar control.  -Counseled on s/sx of and management of hypoglycemia.  -Next A1c anticipated 07/2024.   Written patient instructions provided. Patient verbalized understanding of treatment plan.  Total time in face to face counseling 30 minutes.     Follow-up:  Pharmacist in 1 month  Herlene Fleeta Morris, PharmD, [place], CPP Clinical Pharmacist [facility] [phone]

## 2024-07-24 ENCOUNTER — Other Ambulatory Visit: Payer: Self-pay

## 2024-07-25 ENCOUNTER — Other Ambulatory Visit: Payer: Self-pay

## 2024-07-28 ENCOUNTER — Other Ambulatory Visit (HOSPITAL_COMMUNITY): Payer: Self-pay

## 2024-07-28 ENCOUNTER — Other Ambulatory Visit: Payer: Self-pay

## 2024-07-31 ENCOUNTER — Ambulatory Visit (INDEPENDENT_AMBULATORY_CARE_PROVIDER_SITE_OTHER): Payer: MEDICAID | Admitting: Primary Care

## 2024-07-31 ENCOUNTER — Other Ambulatory Visit: Payer: Self-pay

## 2024-08-07 ENCOUNTER — Other Ambulatory Visit: Payer: Self-pay

## 2024-08-08 ENCOUNTER — Other Ambulatory Visit: Payer: Self-pay

## 2024-08-21 ENCOUNTER — Other Ambulatory Visit: Payer: Self-pay

## 2024-08-24 ENCOUNTER — Ambulatory Visit (INDEPENDENT_AMBULATORY_CARE_PROVIDER_SITE_OTHER): Payer: MEDICAID | Admitting: Primary Care

## 2024-09-07 ENCOUNTER — Other Ambulatory Visit: Payer: Self-pay

## 2024-09-07 MED ORDER — GABAPENTIN 300 MG PO CAPS
300.0000 mg | ORAL_CAPSULE | Freq: Three times a day (TID) | ORAL | 2 refills | Status: AC
  Filled 2024-09-07: qty 90, 30d supply, fill #0

## 2024-09-07 MED ORDER — NALTREXONE HCL 50 MG PO TABS
50.0000 mg | ORAL_TABLET | Freq: Every day | ORAL | 2 refills | Status: AC
  Filled 2024-09-07: qty 30, 30d supply, fill #0

## 2024-09-07 MED ORDER — BUPROPION HCL ER (XL) 150 MG PO TB24
150.0000 mg | ORAL_TABLET | Freq: Every morning | ORAL | 2 refills | Status: AC
  Filled 2024-09-07: qty 30, 30d supply, fill #0

## 2024-09-07 MED ORDER — TRAZODONE HCL 100 MG PO TABS
100.0000 mg | ORAL_TABLET | Freq: Every evening | ORAL | 2 refills | Status: AC | PRN
  Filled 2024-09-07: qty 30, 30d supply, fill #0

## 2024-09-07 NOTE — Progress Notes (Signed)
 "  I connected with  Brandon Mcneil on 09/11/24 by a video enabled telemedicine application and verified that I am speaking with the correct person using two identifiers.   I discussed the limitations of evaluation and management by telemedicine. The patient expressed understanding and agreed to proceed.  Location of patient: home   Location of myself: home   Persons participating in the call: patient and myself   S:    No chief complaint on file.  44 y.o. male who presents for diabetes evaluation, education, and management. PMH is significant for T2DM w/ hx of DKA, HTN, bipolar I, tobacco use, alcohol  use. Patient was referred and last seen by Primary Care Provider, Dr. Rosaline Bohr, on 04/27/2024.  At the last pharmacy visit on 07/11/24, the patient was increased to Lantus  20 units daily and all other medication was continued.   At today's visit, [name] is in good spirits and presents without any assistance. Endorses constipation since injecting Ozempic  at the 2 mg weekly dose. Denies any NV, abdominal pain or vision changes since last visit.  Endorses adherence to the metformin , Lantus , and Humalog . Specifically, he verbalizes his increased insulin  dose of 20 units once daily without my prompting.   Family/Social History:  Fhx: no pertinent positives  Tobacco: current 0.5 PPD smoker Alcohol : 2 cans of beer weekly   Current diabetes medications include:  Lantus  20 units daily Humalog  4 units BID Ozempic  2 mg weekly Metformin  1000 mg XR BID (taking one 500 mg XR tablet BID) Current hypertension medications include:  Amlodipine  10 mg Losartan  50 mg Current hyperlipidemia medications include:  Rosuvastatin  20 mg  Insurance coverage: [employer]  Patient denies hypoglycemic events.  Patient denies nocturia (nighttime urination).  Patient denies neuropathy (nerve pain). Patient denies visual changes.  Patient reports self foot exams.   Patient  reported dietary habits: Eats 2 meals/day Tries to adhere to a diabetic diet  Endorses meals with large servings of carbohydrates like spaghetti  Eats Cookout  Patient-reported exercise habits: none  O:  Has not been able to use sensors recently due to his insurance needing a PA. Last download available was between 10/24 to 08/07/2024.  Date of Download: 10/24 to 08/07/2024. Has not used sensors in about 1 month due to insurance.  % Time CGM is active: 85% during that time Average Glucose: 184 mg/dL Glucose Management Indicator: 7.7  Glucose Variability: 19.8 (goal <36%) Time in Goal:  - Time in range 70-180: 50% - Time above range: 50% - Time below range: 0%   Lab Results  Component Value Date   HGBA1C 7.7 (A) 05/08/2024   There were no vitals filed for this visit.  Lipid Panel     Component Value Date/Time   CHOL 138 09/20/2023 1135   TRIG 81 09/20/2023 1135   HDL 47 09/20/2023 1135   CHOLHDL 2.9 09/20/2023 1135   LDLCALC 75 09/20/2023 1135   Clinical Atherosclerotic Cardiovascular Disease (ASCVD): No  The 10-year ASCVD risk score (Arnett DK, et al., 2019) is: 18.7%   Values used to calculate the score:     Age: 1 years     Clinically relevant sex: Male     Is Non-Hispanic African American: Yes     Diabetic: Yes     Tobacco smoker: Yes     Systolic Blood Pressure: 126 mmHg     Is BP treated: Yes     HDL Cholesterol: 47 mg/dL     Total Cholesterol: 138 mg/dL  Patient is participating in a Managed Medicaid Plan:  Yes, [employer]  A/P: Diabetes longstanding is currently above goal with last A1c 7.7%, however, this is improving. AGP report shows okay glycemic control at home but he has been unable to check recently due to insurance. Will pursue PA. Patient has been adherent to medications. He is not currently hypoglycemic but is able to verbalize appropriate hypoglycemia management plan. Encouraged patient to medications as directed.  DECREASE Ozempic   to 1 mg weekly due to side effects with the 2 mg dose. Continue Humalog  (insulin  lispro) 4 units with breakfast and dinner.  Continued metformin  500 mg as 1 tab (500 mg) BID.  Continue Lantus  20 units daily.  Patient educated on purpose, proper use, and potential adverse effects of insulin , metformin , and Ozempic .  Extensively discussed pathophysiology of diabetes, recommended lifestyle interventions, dietary effects on blood sugar control.  Counseled on s/sx of and management of hypoglycemia.  Next A1c anticipated 09/21/2024. Appt scheduled.   Written patient instructions provided. Patient verbalized understanding of treatment plan.  Total time in face to face counseling 30 minutes.    Follow-up:  Pharmacist 09/21/2024 in person for A1c check.  Herlene Fleeta Morris, PharmD, JAQUELINE, CPP Clinical Pharmacist [facility] [phone]   "

## 2024-09-08 ENCOUNTER — Other Ambulatory Visit: Payer: Self-pay

## 2024-09-11 ENCOUNTER — Telehealth: Payer: Self-pay

## 2024-09-11 ENCOUNTER — Telehealth: Payer: Self-pay | Admitting: Primary Care

## 2024-09-11 ENCOUNTER — Other Ambulatory Visit: Payer: Self-pay

## 2024-09-11 ENCOUNTER — Encounter: Payer: Self-pay | Admitting: Pharmacist

## 2024-09-11 ENCOUNTER — Ambulatory Visit: Payer: MEDICAID | Attending: Primary Care | Admitting: Pharmacist

## 2024-09-11 DIAGNOSIS — Z7984 Long term (current) use of oral hypoglycemic drugs: Secondary | ICD-10-CM

## 2024-09-11 DIAGNOSIS — Z7985 Long-term (current) use of injectable non-insulin antidiabetic drugs: Secondary | ICD-10-CM

## 2024-09-11 DIAGNOSIS — E119 Type 2 diabetes mellitus without complications: Secondary | ICD-10-CM

## 2024-09-11 DIAGNOSIS — Z794 Long term (current) use of insulin: Secondary | ICD-10-CM

## 2024-09-11 MED ORDER — SEMAGLUTIDE (1 MG/DOSE) 4 MG/3ML ~~LOC~~ SOPN
1.0000 mg | PEN_INJECTOR | SUBCUTANEOUS | 3 refills | Status: AC
  Filled 2024-09-11: qty 3, 28d supply, fill #0

## 2024-09-11 NOTE — Telephone Encounter (Signed)
 Pharmacy Patient Advocate Encounter  Received notification from [facility] MEDICAID that Prior Authorization for FREESTYLE LIBRE 3 PLUS SENSOR has been APPROVED from 09/11/2024 to 09/11/2025   PA #/Case ID/Reference #: [id number]

## 2024-09-11 NOTE — Telephone Encounter (Signed)
 Contacted patient and left voicemail requesting to change todays appointment to virtual or to reschedule. No in-person appointments today due to weather. If patient calls back, please switch appointment accordingly.

## 2024-09-13 NOTE — Progress Notes (Shared)
 " [facility] - Clinic Note  09/15/2024   CHIEF COMPLAINT Patient presents for No chief complaint on file.  HISTORY OF PRESENT ILLNESS: Brandon Mcneil is a 44 y.o. male who presents to the clinic today for:      Pt states he's doing well, reports blood sugar control has been good. A1C is 7.7. Pt has been on Ozempic  x 75mo   Referring physician: Celestia Rosaline SQUIBB, NP [id number] [place],  [place] [address]  HISTORICAL INFORMATION:  Selected notes from the MEDICAL RECORD NUMBER Referred by Dr. JAMA:  Ocular Hx- PMH-   CURRENT MEDICATIONS: No current outpatient medications on file. (Ophthalmic Drugs)   No current facility-administered medications for this visit. (Ophthalmic Drugs)   Current Outpatient Medications (Other)  Medication Sig   Accu-Chek Softclix Lancets lancets Use to check blood sugar 3 times daily.   acetaminophen  (TYLENOL ) 325 MG tablet Take 2 tablets (650 mg total) by mouth in the morning and at bedtime. (Patient taking differently: Take 325 mg by mouth every 6 (six) hours as needed for mild pain (pain score 1-3) or headache.)   amLODipine  (NORVASC ) 10 MG tablet Take 1 tablet (10 mg total) by mouth daily.   blood glucose meter kit and supplies KIT Dispense based on patient and insurance preference. Use up to four times daily as directed. (FOR ICD-9 250.00, 250.01).   Blood Glucose Monitoring Suppl (ACCU-CHEK GUIDE) w/Device KIT Use to check blood sugar 3 times daily.   buPROPion  (WELLBUTRIN  XL) 150 MG 24 hr tablet Take 1 tablet (150 mg total) by mouth every morning.   buPROPion  (WELLBUTRIN  XL) 150 MG 24 hr tablet Take 1 tablet by mouth in the morning   Continuous Glucose Receiver (FREESTYLE LIBRE 3 READER) DEVI Use to check blood sugar continuously.   Continuous Glucose Sensor (FREESTYLE LIBRE 3 PLUS SENSOR) MISC Change sensor every 15 days. Use to check blood sugar continuously.   doxycycline  (VIBRA -TABS) 100 MG tablet Take 1  tablet (100 mg total) by mouth 2 (two) times daily.   gabapentin  (NEURONTIN ) 300 MG capsule Take 1 capsule (300 mg total) by mouth 3 (three) times daily for anxiety.   gabapentin  (NEURONTIN ) 300 MG capsule Take 1 capsule (300 mg total) by mouth 3 (three) times daily.   glucose blood (ACCU-CHEK GUIDE TEST) test strip Use to check blood sugar 3 times daily.   hydrocortisone  cream 0.5 % Apply 1 Application topically 2 (two) times daily. Apply around neck   insulin  glargine (LANTUS  SOLOSTAR) 100 UNIT/ML Solostar Pen Inject 20 Units into the skin daily.   insulin  lispro (HUMALOG  KWIKPEN) 100 UNIT/ML KwikPen Inject 4 Units into the skin 2 (two) times daily.   Insulin  Pen Needle (TRUEPLUS 5-BEVEL PEN NEEDLES) 32G X 4 MM MISC Use to inject Basaglar  once daily.   losartan  (COZAAR ) 50 MG tablet Take 1 tablet (50 mg total) by mouth daily.   metFORMIN  (GLUCOPHAGE -XR) 500 MG 24 hr tablet Take 2 tablets (1,000 mg total) by mouth 2 (two) times daily with a meal.   naltrexone  (DEPADE) 50 MG tablet Take 1 tablet (50 mg total) by mouth daily at the same time every day for alcohol  (Patient not taking: Reported on 10/18/2023)   naltrexone  (DEPADE) 50 MG tablet Take 1 tablet (50 mg total) by mouth daily.   naltrexone  (DEPADE) 50 MG tablet Take 1 tablet daily   polyethylene glycol (MIRALAX  / GLYCOLAX ) 17 g packet Take 17 g by mouth 2 (two) times  daily.   rosuvastatin  (CRESTOR ) 20 MG tablet Take 1 tablet (20 mg total) by mouth daily.   Semaglutide , 1 MG/DOSE, 4 MG/3ML SOPN Inject 1 mg as directed once a week.   traZODone  (DESYREL ) 100 MG tablet Take 1 tablet by mouth at bedtime as needed.   traZODone  (DESYREL ) 100 MG tablet Take 1 Tablet by mouth at bedtime as needed for sleep   traZODone  (DESYREL ) 50 MG tablet Take 1-2 tablets (50-100 mg total) by mouth at bedtime for sleep. (Patient not taking: Reported on 05/03/2024)   witch hazel-glycerin  (TUCKS) pad Apply topically as needed for irritation or hemorrhoids.   No  current facility-administered medications for this visit. (Other)   REVIEW OF SYSTEMS:    ALLERGIES Allergies  Allergen Reactions   Lisinopril  Swelling and Other (See Comments)    Lips and mouth became swollen   Penicillins Anaphylaxis, Itching, Swelling and Rash   Ibuprofen  Other (See Comments)    Was told to not take this (because of a prior ulcer)   Tomato Hives and Other (See Comments)    Certain tomato sauces break out the lips in HIVES   PAST MEDICAL HISTORY Past Medical History:  Diagnosis Date   Abscess, perirectal 08/12/2020   Bipolar I disorder, most recent episode depressed (HCC) 09/11/2013   Depression    Essential hypertension 02/03/2017   Hypercholesteremia    Lumbar foraminal stenosis 01/06/2023   Poorly controlled diabetes mellitus (HCC) 01/06/2023   Tobacco use disorder 09/11/2013   Past Surgical History:  Procedure Laterality Date   INCISION AND DRAINAGE ABSCESS  05/2015   Left buttock   INCISION AND DRAINAGE ABSCESS  10/2015   Natal cleft - ED   INCISION AND DRAINAGE ABSCESS  02/2016   natal cleft   INCISION AND DRAINAGE ABSCESS  04/2018   left gluteal abscess   INCISION AND DRAINAGE ABSCESS  03/2021   right gluteal   INCISION AND DRAINAGE INTRA ORAL ABSCESS  2015   INCISION AND DRAINAGE PERIRECTAL ABSCESS N/A 08/02/2020   I&D right perirectal abscess   FAMILY HISTORY Family History  Problem Relation Age of Onset   Healthy Mother    Healthy Father    SOCIAL HISTORY Social History   Tobacco Use   Smoking status: Every Day    Current packs/day: 0.50    Average packs/day: 0.5 packs/day for 14.0 years (7.0 ttl pk-yrs)    Types: Cigarettes   Smokeless tobacco: Never   Tobacco comments:    1/2 pack a day  Vaping Use   Vaping status: Never Used  Substance Use Topics   Alcohol  use: Yes    Alcohol /week: 14.0 standard drinks of alcohol     Types: 14 Cans of beer per week    Comment: 2 a day   Drug use: No    Types: Marijuana        OPHTHALMIC EXAM:  Not recorded    IMAGING AND PROCEDURES  Imaging and Procedures for 09/15/2024          ASSESSMENT/PLAN: No diagnosis found.  1-3. Moderate nonproliferative diabetic retinopathy w/o DME, both eyes  - A1C on 9.22.25 was 7.7, 5.23.24 was 9.5 - The incidence, risk factors for progression, natural history and treatment options for diabetic retinopathy were discussed with patient.   - The need for close monitoring of blood glucose, blood pressure, and serum lipids, avoiding cigarette or any type of tobacco, and the need for long term follow up was also discussed with patient.  - BCVA 20/20 OU --  stable - FA on 5.14.25 shows NPDR OU -- scattered leaking MA, NO NV. - OCT shows OD: Persistent cystic changes and IRHM inferior mac; OS: Scattered cystic changes and IRHM greatest ST mac. - no treatment recommended at this time -- monitor - f/u in 4-41mos -- DFE/OCT   4,5. Hypertensive retinopathy OU - discussed importance of tight BP control - monitor  6. Mixed Cataract OU - The symptoms of cataract, surgical options, and treatments and risks were discussed with patient. - discussed diagnosis and progression - monitor  Ophthalmic Meds Ordered this visit:  No orders of the defined types were placed in this encounter.    No follow-ups on file.  There are no Patient Instructions on file for this visit.  This document serves as a record of services personally performed by Redell JUDITHANN Hans, MD, PhD. It was created on their behalf by Delon Newness COT, an ophthalmic technician. The creation of this record is the provider's dictation and/or activities during the visit.    Electronically signed by: Delon Newness COT 01.28.26 9:33 AM   Abbreviations: M myopia (nearsighted); A astigmatism; H hyperopia (farsighted); P presbyopia; Mrx spectacle prescription;  CTL contact lenses; OD right eye; OS left eye; OU both eyes  XT exotropia; ET esotropia; PEK punctate  epithelial keratitis; PEE punctate epithelial erosions; DES dry eye syndrome; MGD meibomian gland dysfunction; ATs artificial tears; PFAT's preservative free artificial tears; NSC nuclear sclerotic cataract; PSC posterior subcapsular cataract; ERM epi-retinal membrane; PVD posterior vitreous detachment; RD retinal detachment; DM diabetes mellitus; DR diabetic retinopathy; NPDR non-proliferative diabetic retinopathy; PDR proliferative diabetic retinopathy; CSME clinically significant macular edema; DME diabetic macular edema; dbh dot blot hemorrhages; CWS cotton wool spot; POAG primary open angle glaucoma; C/D cup-to-disc ratio; HVF humphrey visual field; GVF goldmann visual field; OCT optical coherence tomography; IOP intraocular pressure; BRVO Branch retinal vein occlusion; CRVO central retinal vein occlusion; CRAO central retinal artery occlusion; BRAO branch retinal artery occlusion; RT retinal tear; SB scleral buckle; PPV pars plana vitrectomy; VH Vitreous hemorrhage; PRP panretinal laser photocoagulation; IVK intravitreal kenalog; VMT vitreomacular traction; MH Macular hole;  NVD neovascularization of the disc; NVE neovascularization elsewhere; AREDS age related eye disease study; ARMD age related macular degeneration; POAG primary open angle glaucoma; EBMD epithelial/anterior basement membrane dystrophy; ACIOL anterior chamber intraocular lens; IOL intraocular lens; PCIOL posterior chamber intraocular lens; Phaco/IOL phacoemulsification with intraocular lens placement; PRK photorefractive keratectomy; LASIK laser assisted in situ keratomileusis; HTN hypertension; DM diabetes mellitus; COPD chronic obstructive pulmonary disease  "

## 2024-09-14 ENCOUNTER — Encounter (HOSPITAL_COMMUNITY): Payer: MEDICAID | Admitting: Psychiatry

## 2024-09-15 ENCOUNTER — Encounter (INDEPENDENT_AMBULATORY_CARE_PROVIDER_SITE_OTHER): Payer: MEDICAID | Admitting: Ophthalmology

## 2024-09-15 DIAGNOSIS — E113393 Type 2 diabetes mellitus with moderate nonproliferative diabetic retinopathy without macular edema, bilateral: Secondary | ICD-10-CM

## 2024-09-15 DIAGNOSIS — H35033 Hypertensive retinopathy, bilateral: Secondary | ICD-10-CM

## 2024-09-15 DIAGNOSIS — H25013 Cortical age-related cataract, bilateral: Secondary | ICD-10-CM

## 2024-09-15 DIAGNOSIS — Z7984 Long term (current) use of oral hypoglycemic drugs: Secondary | ICD-10-CM

## 2024-09-15 DIAGNOSIS — I10 Essential (primary) hypertension: Secondary | ICD-10-CM

## 2024-09-15 DIAGNOSIS — E119 Type 2 diabetes mellitus without complications: Secondary | ICD-10-CM

## 2024-09-20 ENCOUNTER — Other Ambulatory Visit: Payer: Self-pay

## 2024-09-21 ENCOUNTER — Ambulatory Visit: Payer: Self-pay | Admitting: Pharmacist

## 2024-09-22 ENCOUNTER — Telehealth (INDEPENDENT_AMBULATORY_CARE_PROVIDER_SITE_OTHER): Payer: Self-pay

## 2024-09-22 NOTE — Telephone Encounter (Unsigned)
 Copied from CRM [id number]. Topic: Appointments - Scheduling Inquiry for Clinic >> Sep 21, 2024  1:19 PM Antony RAMAN wrote: Reason for CRM: needs to reschedule with garnette fleeta morris

## 2024-10-12 ENCOUNTER — Encounter (HOSPITAL_COMMUNITY): Payer: MEDICAID | Admitting: Psychiatry
